# Patient Record
Sex: Male | Born: 1950 | Race: White | Hispanic: No | Marital: Married | Smoking: Former smoker
Health system: Southern US, Community
[De-identification: ages and names within clinical notes are randomized; demographics above are authoritative.]

## PROBLEM LIST (undated history)

## (undated) DIAGNOSIS — J449 Chronic obstructive pulmonary disease, unspecified: Secondary | ICD-10-CM

## (undated) DIAGNOSIS — B029 Zoster without complications: Secondary | ICD-10-CM

## (undated) DIAGNOSIS — B3781 Candidal esophagitis: Secondary | ICD-10-CM

## (undated) DIAGNOSIS — M797 Fibromyalgia: Secondary | ICD-10-CM

## (undated) DIAGNOSIS — I10 Essential (primary) hypertension: Secondary | ICD-10-CM

## (undated) DIAGNOSIS — C649 Malignant neoplasm of unspecified kidney, except renal pelvis: Secondary | ICD-10-CM

## (undated) DIAGNOSIS — B9681 Helicobacter pylori [H. pylori] as the cause of diseases classified elsewhere: Secondary | ICD-10-CM

## (undated) DIAGNOSIS — K2970 Gastritis, unspecified, without bleeding: Secondary | ICD-10-CM

## (undated) DIAGNOSIS — I2510 Atherosclerotic heart disease of native coronary artery without angina pectoris: Secondary | ICD-10-CM

## (undated) HISTORY — PX: THYROID SURGERY: SHX805

## (undated) HISTORY — DX: Helicobacter pylori (H. pylori) as the cause of diseases classified elsewhere: B96.81

## (undated) HISTORY — PX: HERNIA REPAIR: SHX51

## (undated) HISTORY — DX: Gastritis, unspecified, without bleeding: K29.70

## (undated) HISTORY — PX: OTHER SURGICAL HISTORY: SHX169

## (undated) HISTORY — DX: Candidal esophagitis: B37.81

---

## 2000-11-09 ENCOUNTER — Emergency Department (HOSPITAL_COMMUNITY): Admission: EM | Admit: 2000-11-09 | Discharge: 2000-11-09 | Payer: Self-pay | Admitting: Internal Medicine

## 2000-11-09 ENCOUNTER — Encounter: Payer: Self-pay | Admitting: Emergency Medicine

## 2001-05-06 ENCOUNTER — Encounter: Admission: RE | Admit: 2001-05-06 | Discharge: 2001-05-06 | Payer: Self-pay | Admitting: Family Medicine

## 2001-05-26 ENCOUNTER — Encounter: Admission: RE | Admit: 2001-05-26 | Discharge: 2001-05-26 | Payer: Self-pay | Admitting: Family Medicine

## 2001-07-02 ENCOUNTER — Encounter: Admission: RE | Admit: 2001-07-02 | Discharge: 2001-07-02 | Payer: Self-pay | Admitting: Family Medicine

## 2001-08-08 ENCOUNTER — Emergency Department (HOSPITAL_COMMUNITY): Admission: EM | Admit: 2001-08-08 | Discharge: 2001-08-08 | Payer: Self-pay | Admitting: *Deleted

## 2001-08-11 ENCOUNTER — Encounter: Admission: RE | Admit: 2001-08-11 | Discharge: 2001-08-11 | Payer: Self-pay | Admitting: Family Medicine

## 2001-09-01 ENCOUNTER — Encounter: Admission: RE | Admit: 2001-09-01 | Discharge: 2001-09-01 | Payer: Self-pay | Admitting: Family Medicine

## 2003-01-04 ENCOUNTER — Emergency Department (HOSPITAL_COMMUNITY): Admission: EM | Admit: 2003-01-04 | Discharge: 2003-01-05 | Payer: Self-pay | Admitting: Unknown Physician Specialty

## 2003-01-04 ENCOUNTER — Encounter: Payer: Self-pay | Admitting: Emergency Medicine

## 2003-09-11 HISTORY — PX: PARTIAL NEPHRECTOMY: SHX414

## 2003-10-25 ENCOUNTER — Emergency Department (HOSPITAL_COMMUNITY): Admission: EM | Admit: 2003-10-25 | Discharge: 2003-10-25 | Payer: Self-pay | Admitting: Family Medicine

## 2004-05-24 ENCOUNTER — Encounter: Admission: RE | Admit: 2004-05-24 | Discharge: 2004-05-24 | Payer: Self-pay | Admitting: Family Medicine

## 2004-06-29 ENCOUNTER — Inpatient Hospital Stay (HOSPITAL_COMMUNITY): Admission: RE | Admit: 2004-06-29 | Discharge: 2004-07-06 | Payer: Self-pay | Admitting: Urology

## 2004-06-29 ENCOUNTER — Encounter (INDEPENDENT_AMBULATORY_CARE_PROVIDER_SITE_OTHER): Payer: Self-pay | Admitting: *Deleted

## 2005-09-10 ENCOUNTER — Emergency Department (HOSPITAL_COMMUNITY): Admission: EM | Admit: 2005-09-10 | Discharge: 2005-09-10 | Payer: Self-pay | Admitting: Family Medicine

## 2005-12-21 ENCOUNTER — Encounter: Admission: RE | Admit: 2005-12-21 | Discharge: 2005-12-21 | Payer: Self-pay | Admitting: Family Medicine

## 2008-09-25 ENCOUNTER — Emergency Department (HOSPITAL_COMMUNITY): Admission: EM | Admit: 2008-09-25 | Discharge: 2008-09-25 | Payer: Self-pay | Admitting: Emergency Medicine

## 2008-12-09 ENCOUNTER — Emergency Department (HOSPITAL_COMMUNITY): Admission: EM | Admit: 2008-12-09 | Discharge: 2008-12-09 | Payer: Self-pay | Admitting: Emergency Medicine

## 2008-12-16 ENCOUNTER — Encounter (INDEPENDENT_AMBULATORY_CARE_PROVIDER_SITE_OTHER): Payer: Self-pay | Admitting: Internal Medicine

## 2008-12-16 ENCOUNTER — Ambulatory Visit: Payer: Self-pay | Admitting: Cardiology

## 2008-12-16 ENCOUNTER — Ambulatory Visit (HOSPITAL_COMMUNITY): Admission: RE | Admit: 2008-12-16 | Discharge: 2008-12-16 | Payer: Self-pay | Admitting: Internal Medicine

## 2009-04-26 ENCOUNTER — Ambulatory Visit (HOSPITAL_COMMUNITY): Admission: RE | Admit: 2009-04-26 | Discharge: 2009-04-26 | Payer: Self-pay | Admitting: Internal Medicine

## 2009-05-01 ENCOUNTER — Emergency Department (HOSPITAL_COMMUNITY): Admission: EM | Admit: 2009-05-01 | Discharge: 2009-05-01 | Payer: Self-pay | Admitting: Emergency Medicine

## 2009-09-20 ENCOUNTER — Ambulatory Visit (HOSPITAL_COMMUNITY): Admission: RE | Admit: 2009-09-20 | Discharge: 2009-09-20 | Payer: Self-pay | Admitting: Internal Medicine

## 2009-09-25 ENCOUNTER — Emergency Department (HOSPITAL_COMMUNITY): Admission: EM | Admit: 2009-09-25 | Discharge: 2009-09-25 | Payer: Self-pay | Admitting: Emergency Medicine

## 2009-12-18 ENCOUNTER — Inpatient Hospital Stay (HOSPITAL_COMMUNITY): Admission: EM | Admit: 2009-12-18 | Discharge: 2009-12-20 | Payer: Self-pay | Admitting: Emergency Medicine

## 2010-02-07 ENCOUNTER — Ambulatory Visit (HOSPITAL_COMMUNITY): Admission: RE | Admit: 2010-02-07 | Discharge: 2010-02-07 | Payer: Self-pay | Admitting: Internal Medicine

## 2010-11-26 LAB — CBC
HCT: 44.5 % (ref 39.0–52.0)
Hemoglobin: 15 g/dL (ref 13.0–17.0)
Platelets: 236 10*3/uL (ref 150–400)
RBC: 4.77 MIL/uL (ref 4.22–5.81)
WBC: 10.5 10*3/uL (ref 4.0–10.5)

## 2010-11-26 LAB — DIFFERENTIAL
Eosinophils Absolute: 0.1 10*3/uL (ref 0.0–0.7)
Eosinophils Relative: 1 % (ref 0–5)
Lymphocytes Relative: 13 % (ref 12–46)
Lymphs Abs: 1.3 10*3/uL (ref 0.7–4.0)
Neutrophils Relative %: 74 % (ref 43–77)

## 2010-11-26 LAB — URINALYSIS, ROUTINE W REFLEX MICROSCOPIC
Bilirubin Urine: NEGATIVE
Glucose, UA: NEGATIVE mg/dL
Hgb urine dipstick: NEGATIVE
Nitrite: NEGATIVE
Urobilinogen, UA: 0.2 mg/dL (ref 0.0–1.0)
pH: 5.5 (ref 5.0–8.0)

## 2010-11-26 LAB — BASIC METABOLIC PANEL
BUN: 28 mg/dL — ABNORMAL HIGH (ref 6–23)
CO2: 30 mEq/L (ref 19–32)
Chloride: 100 mEq/L (ref 96–112)
Creatinine, Ser: 1.68 mg/dL — ABNORMAL HIGH (ref 0.4–1.5)
Sodium: 135 mEq/L (ref 135–145)

## 2010-11-27 LAB — BLOOD GAS, ARTERIAL
Acid-Base Excess: 4.9 mmol/L — ABNORMAL HIGH (ref 0.0–2.0)
Bicarbonate: 29.5 mEq/L — ABNORMAL HIGH (ref 20.0–24.0)
O2 Saturation: 92.7 %
Patient temperature: 37
TCO2: 25.4 mmol/L (ref 0–100)
pH, Arterial: 7.403 (ref 7.350–7.450)
pO2, Arterial: 60.5 mmHg — ABNORMAL LOW (ref 80.0–100.0)

## 2010-11-29 LAB — CBC
Hemoglobin: 13.9 g/dL (ref 13.0–17.0)
MCV: 94.8 fL (ref 78.0–100.0)
Platelets: 250 10*3/uL (ref 150–400)
RDW: 14.4 % (ref 11.5–15.5)
WBC: 12.8 10*3/uL — ABNORMAL HIGH (ref 4.0–10.5)

## 2010-11-29 LAB — BASIC METABOLIC PANEL
CO2: 30 mEq/L (ref 19–32)
CO2: 31 mEq/L (ref 19–32)
Calcium: 8.2 mg/dL — ABNORMAL LOW (ref 8.4–10.5)
Chloride: 102 mEq/L (ref 96–112)
Chloride: 102 mEq/L (ref 96–112)
Creatinine, Ser: 1.62 mg/dL — ABNORMAL HIGH (ref 0.4–1.5)
Creatinine, Ser: 1.62 mg/dL — ABNORMAL HIGH (ref 0.4–1.5)
GFR calc non Af Amer: 44 mL/min — ABNORMAL LOW (ref >60)
Glucose, Bld: 120 mg/dL — ABNORMAL HIGH (ref 70–99)
Glucose, Bld: 168 mg/dL — ABNORMAL HIGH (ref 70–99)
Glucose, Bld: 211 mg/dL — ABNORMAL HIGH (ref 70–99)
Potassium: 4.8 mEq/L (ref 3.5–5.1)
Potassium: 5.2 mEq/L — ABNORMAL HIGH (ref 3.5–5.1)
Sodium: 137 mEq/L (ref 135–145)

## 2010-11-29 LAB — BASIC METABOLIC PANEL WITH GFR
BUN: 28 mg/dL — ABNORMAL HIGH (ref 6–23)
CO2: 35 meq/L — ABNORMAL HIGH (ref 19–32)
Calcium: 8.2 mg/dL — ABNORMAL LOW (ref 8.4–10.5)
Chloride: 100 meq/L (ref 96–112)
Creatinine, Ser: 1.68 mg/dL — ABNORMAL HIGH (ref 0.4–1.5)
GFR calc non Af Amer: 42 mL/min — ABNORMAL LOW
Glucose, Bld: 97 mg/dL (ref 70–99)
Potassium: 3.8 meq/L (ref 3.5–5.1)
Sodium: 140 meq/L (ref 135–145)

## 2010-11-29 LAB — CULTURE, RESPIRATORY W GRAM STAIN: Culture: NORMAL

## 2010-11-29 LAB — POCT CARDIAC MARKERS
CKMB, poc: 1.6 ng/mL (ref 1.0–8.0)
Myoglobin, poc: 141 ng/mL (ref 12–200)
Troponin i, poc: <0.05 ng/mL (ref 0.00–0.09)

## 2010-11-29 LAB — DIFFERENTIAL
Basophils Absolute: 0 10*3/uL (ref 0.0–0.1)
Lymphs Abs: 1.6 10*3/uL (ref 0.7–4.0)
Neutro Abs: 10.1 10*3/uL — ABNORMAL HIGH (ref 1.7–7.7)
Neutrophils Relative %: 79 % — ABNORMAL HIGH (ref 43–77)

## 2010-11-29 LAB — EXPECTORATED SPUTUM ASSESSMENT W GRAM STAIN, RFLX TO RESP C

## 2010-12-16 LAB — CREATININE, SERUM: GFR calc Af Amer: 59 mL/min — ABNORMAL LOW (ref >60)

## 2010-12-25 LAB — GLUCOSE, CAPILLARY: Glucose-Capillary: 71 mg/dL (ref 70–99)

## 2011-01-26 NOTE — Discharge Summary (Signed)
Connor Armstrong, Connor Armstrong             ACCOUNT NO.:  [id number]   MEDICAL RECORD NO.:  [id number]          PATIENT TYPE:  INP   LOCATION:  0364                         FACILITY:  [facility]   PHYSICIAN:  Lucrezia Starch. Earlene Plater, M.D.  DATE OF BIRTH:  1951-06-12   DATE OF ADMISSION:  06/29/2004  DATE OF DISCHARGE:  07/06/2004                                 DISCHARGE SUMMARY   Connor Armstrong was admitted on June 23, 2004, for a 2 x 2 cm right renal  mass.  Connor Armstrong underwent right partial nephrectomy by Dr. Darvin Neighbours.   HISTORY OF PRESENT ILLNESS:  Connor Armstrong has had a 7 day hospital course complicated  by a postop ileus.  Connor Armstrong was seen by GI, Dr. Danise Edge.  Connor Armstrong also has had  some sleep apnea, and Connor Armstrong has been using O2 at night to maintain saturations.  Today, Connor Armstrong is doing well.  Connor Armstrong continues to get the Vicodin p.r.n. for pain.  Connor Armstrong is ambulating.  Connor Armstrong is using his incentive spirometry.  Connor Armstrong is passing gas,  voiding well, and having a bowel movement x 3 since admission.   Past medical history, social history, review of systems.  See H&P for full  details.   PHYSICAL EXAMINATION:  GENERAL:  Thin, white male in no acute distress.  HEENT:  Normocephalic.  CHEST:  Lungs clear to auscultation.  CARDIAC:  Regular rate and rhythm.  ABDOMEN:  Soft, mildly distended, nonpainful.  No palpable.  Right flank  incision staples intact with 2 x 2's on right mid quadrant and at posterior  edge of incision.  EXTREMITIES:  Pedal pulses present, no pain, negative to [place].   DISCHARGE MEDICATIONS:  1.  Colace 100 mg p.o. b.i.d.  2.  Vicodin 1-2 tabs q.3-4h. p.r.n. pain.  3.  For breakthrough pain, may use Tylenol or Motrin, p.r.n. pain as well.   DISCHARGE CONDITION:  Stable.  Connor Armstrong is to follow up in 1 week with Dr. Earlene Plater.   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     Victorino Dike   JML/MEDQ  D:  07/06/2004  T:  07/06/2004  Job:   [id number]

## 2011-01-26 NOTE — Op Note (Signed)
NAMEJERID, CATHERMAN             ACCOUNT NO.:  [id number]   MEDICAL RECORD NO.:  [id number]          PATIENT TYPE:  INP   LOCATION:  0002                         FACILITY:  [facility]   PHYSICIAN:  Ronald L. Earlene Plater, M.D.  DATE OF BIRTH:  July 17, 1951   DATE OF PROCEDURE:  06/29/2004  DATE OF DISCHARGE:                                 OPERATIVE REPORT   PREOPERATIVE DIAGNOSIS:  Right kidney tumor.   POSTOPERATIVE DIAGNOSIS:  Right kidney tumor.   PROCEDURE:  Right partial nephrectomy.   ATTENDING PHYSICIAN:  Lucrezia Starch. Earlene Plater, M.D.   RESIDENT SURGEON:  Rhae Lerner, M.D.   ANESTHESIA:  General endotracheal anesthesia.   COMPLICATIONS:  None.   ESTIMATED BLOOD LOSS:  100 cc.   INDICATIONS FOR PROCEDURE:  Mr. Dimarco is a 60 year old white male who  presented for evaluation of a 2 x 2 cm right mid polar renal mass that was  noted on a CT scan of the abdomen. The CT scan had been performed during  workup for a 30-pound weight loss. CT scan demonstrated the right mid polar  mass which enhanced consistent with renal cell carcinoma. After discussing  treatments options with Mr. Membreno, he has elected to proceed with an open  right partial nephrectomy.   PROCEDURE IN DETAIL:  The patient was brought to the operating room  following induction of general endotracheal anesthesia. Was placed a  modified right flank position and prepped and draped in the usual sterile  fashion. An incision was subsequently made from a point midway between the  xiphoid process and the umbilicus in the midline to the tip of the 11th rib.  Extension was carried down through the muscle layers and the fascia and into  the retroperitoneum. The peritoneum was subsequently identified and opened  carefully with Metzenbaum scissors. The peritoneum was subsequently opened  along the anterior incision to provide better exposure to the right kidney  and right retroperitoneum. The right kidney was subsequently  identified  within Gerota's fascia, and the Buckwalter retractor was placed with the  retractor blade set for maximal exposure of the right kidney and right  retroperitoneum. The duodenum was then identified and reflected medially  away from the right kidney using a Coker maneuver. Once this had been  performed, the anterior leaf of Gerota's fascia was fully freed from all  surrounding attachments and carried down to the level of the inferior vena  cava. The vena cava was followed superiorly, carefully dissecting away all  adherent retroperitoneal tissues until the take off of the right renal vein  was identified. Careful dissection of the right femoral vein was  subsequently performed until the vein was easily isolated with a vessel  loop. A small right renal artery was subsequently identified posterior and  inferior to the renal vein. Again this vessel was skeletonized, and a vessel  loop placed for identification. At this point, further dissection of the  hilum was meticulously performed in an attempt to identify a second renal  artery as the first was quite small. A much larger renal artery was  eventually identified lying immediately posterior  and adherent to the renal  vein. At this point, all vessels had been isolated, and the remainder of the  right kidney was subsequently dissected away from all surrounding tissues.  During this process, the inferior pole was freed and the ureter identified.  In addition, Gerota's fascia was entered, and the right adrenal gland  carefully dissected off of the upper pole of the right kidney. Once the  right kidney had been adequately isolated, the mid polar tumor was  identified and circumscribed using Bovie cautery. Bulldog clamps were  subsequently placed on the two right renal arteries. The tumor was then  carefully excised from the kidney using the blunt handle of a scalpel. The  tumor was subsequently sent for frozen section analysis to confirm  negative  tumor margins. Following frozen resection results, the base of the tumor bed  was closed using running 4-0 chromic suture. The edges as well as the bed of  resection site were subsequently burned with an Argon beam. Once the bed  appeared dry, Tisseel was applied followed Surgicel. Once this had dried,  the bulldog clamps were removed from the arteries, and a second inspection  performed to assure hemostasis. Once hemostasis had been confirmed, the  combination of perirenal fat as well as omentum were approximated over the  kidney using interrupted 3-0 chromic suture. At this point, frozen section  analysis came back confirming negative tumor margins. Kidney was  subsequently inspected a final time, and the vessel loops were removed. The  retractor blades were subsequently removed and closure of the wounds  initiated. The wound was closed in three layers, the first a running #1 PDS  reapproximated the transverse abdominis fascia. A second layer of running #1  PDS reapproximated the internal oblique fascia, and a third running #1 PDS  was used to reapproximate the external oblique fascia and aponeurosis. Skin  was subsequently closed using skin staples. At this point in the procedure,  the patient was awakened and the case was ended. The patient tolerated the  procedure well and no complications. Please note that Dr. Darvin Neighbours was  present for entire case and participated in all aspects of the procedure.      JP/MEDQ  D:  06/29/2004  T:  06/29/2004  Job:  [id number]

## 2011-01-26 NOTE — H&P (Signed)
Connor Armstrong, Connor Armstrong             ACCOUNT NO.:  [id number]   MEDICAL RECORD NO.:  [id number]          PATIENT TYPE:  INP   LOCATION:  0002                         FACILITY:  [facility]   PHYSICIAN:  Ronald L. Earlene Plater, M.D.  DATE OF BIRTH:  December 09, 1950   DATE OF ADMISSION:  06/29/2004  DATE OF DISCHARGE:                                HISTORY & PHYSICAL   HISTORY OF PRESENT ILLNESS:  [name] is a 60 year old white male with a  past medical history significant for COPD and smoking who presented with a  20 pound weight loss. The patient underwent evaluation with a CT scan of the  abdomen with contrast which revealed a 2 x 2 cm right renal mass.  After a  full evaluation and discussion of treatment options with [name]  including total versus partial right nephrectomy [name] has elected to  proceed with an attempted right partial nephrectomy.   REVIEW OF SYMPTOMS:  As above otherwise negative.   PAST MEDICAL HISTORY:  COPD, sleep apnea.   PAST SURGICAL HISTORY:  Status post parathyroidectomy, status post kidney  stone removal, status post umbilical hernia repair as a child.   ALLERGIES:  No known drug allergies.   FAMILY HISTORY:  No known history of GU malignancy.   SOCIAL HISTORY:  The patient has smoked 2-3 packs per day for 30 years. He  denies any alcohol use or illicit drug use.   MEDICATIONS:  None.   PHYSICAL EXAMINATION:  HEENT:  Pupils equal round and reactive to light.  Extraocular movements intact.  NECK:  No masses.  HEART:  Regular rate and rhythm without murmurs.  LUNGS:  Clear to auscultation bilaterally.  ABDOMEN:  Soft, nontender, nondistended, bowel sounds positive.  EXTREMITIES:  No cyanosis, clubbing or edema.   ASSESSMENT:  A 60 year old male with a right kidney tumor.  Will plan to  take [name] to the operating room at which time he will undergo an  attempted right partial nephrectomy. The patient understands that a total  nephrectomy may  be necessary at the time of surgery due to tumor location or  in the event of significant bleeding.     JP/MEDQ  D:  06/29/2004  T:  06/29/2004  Job:  [id number]

## 2011-09-17 ENCOUNTER — Encounter: Payer: Self-pay | Admitting: *Deleted

## 2011-09-17 ENCOUNTER — Other Ambulatory Visit: Payer: Self-pay

## 2011-09-17 ENCOUNTER — Emergency Department (HOSPITAL_COMMUNITY): Payer: Medicaid Other

## 2011-09-17 ENCOUNTER — Inpatient Hospital Stay (HOSPITAL_COMMUNITY)
Admission: EM | Admit: 2011-09-17 | Discharge: 2011-09-21 | DRG: 192 | Disposition: A | Discharge status: Home / Self Care | Payer: Medicaid Other | Attending: Pulmonary Disease | Admitting: Pulmonary Disease

## 2011-09-17 DIAGNOSIS — R0902 Hypoxemia: Secondary | ICD-10-CM

## 2011-09-17 DIAGNOSIS — J441 Chronic obstructive pulmonary disease with (acute) exacerbation: Secondary | ICD-10-CM | POA: Diagnosis present

## 2011-09-17 DIAGNOSIS — J449 Chronic obstructive pulmonary disease, unspecified: Secondary | ICD-10-CM

## 2011-09-17 DIAGNOSIS — M797 Fibromyalgia: Secondary | ICD-10-CM | POA: Diagnosis present

## 2011-09-17 DIAGNOSIS — IMO0001 Reserved for inherently not codable concepts without codable children: Secondary | ICD-10-CM | POA: Diagnosis present

## 2011-09-17 DIAGNOSIS — J440 Chronic obstructive pulmonary disease with acute lower respiratory infection: Principal | ICD-10-CM | POA: Diagnosis present

## 2011-09-17 DIAGNOSIS — J209 Acute bronchitis, unspecified: Principal | ICD-10-CM | POA: Diagnosis present

## 2011-09-17 DIAGNOSIS — I10 Essential (primary) hypertension: Secondary | ICD-10-CM | POA: Diagnosis present

## 2011-09-17 HISTORY — DX: Fibromyalgia: M79.7

## 2011-09-17 LAB — DIFFERENTIAL
Basophils Absolute: 0 10*3/uL (ref 0.0–0.1)
Lymphocytes Relative: 6 % — ABNORMAL LOW (ref 12–46)
Monocytes Absolute: 1.1 10*3/uL — ABNORMAL HIGH (ref 0.1–1.0)
Neutro Abs: 14.9 10*3/uL — ABNORMAL HIGH (ref 1.7–7.7)

## 2011-09-17 LAB — BASIC METABOLIC PANEL
CO2: 34 mEq/L — ABNORMAL HIGH (ref 19–32)
Chloride: 98 mEq/L (ref 96–112)
Creatinine, Ser: 1.3 mg/dL (ref 0.50–1.35)
Sodium: 136 mEq/L (ref 135–145)

## 2011-09-17 LAB — CBC
HCT: 41.5 % (ref 39.0–52.0)
Hemoglobin: 13.3 g/dL (ref 13.0–17.0)
RDW: 14.3 % (ref 11.5–15.5)
WBC: 17.2 10*3/uL — ABNORMAL HIGH (ref 4.0–10.5)

## 2011-09-17 LAB — INFLUENZA PANEL BY PCR (TYPE A & B): H1N1 flu by pcr: NOT DETECTED

## 2011-09-17 LAB — MRSA PCR SCREENING: MRSA by PCR: NEGATIVE

## 2011-09-17 LAB — CULTURE, BLOOD (ROUTINE X 2): Culture: NO GROWTH

## 2011-09-17 LAB — LACTIC ACID, PLASMA: Lactic Acid, Venous: 1.6 mmol/L (ref 0.5–2.2)

## 2011-09-17 MED ORDER — ALBUTEROL SULFATE (5 MG/ML) 0.5% IN NEBU
5.0000 mg | INHALATION_SOLUTION | Freq: Once | RESPIRATORY_TRACT | Status: AC
  Administered 2011-09-17: 5 mg via RESPIRATORY_TRACT
  Filled 2011-09-17: qty 1

## 2011-09-17 MED ORDER — ZOLPIDEM TARTRATE 5 MG PO TABS
5.0000 mg | ORAL_TABLET | Freq: Every evening | ORAL | Status: DC | PRN
  Administered 2011-09-18 – 2011-09-20 (×3): 5 mg via ORAL
  Filled 2011-09-17 (×3): qty 1

## 2011-09-17 MED ORDER — ALBUTEROL SULFATE (5 MG/ML) 0.5% IN NEBU
2.5000 mg | INHALATION_SOLUTION | Freq: Four times a day (QID) | RESPIRATORY_TRACT | Status: DC
  Administered 2011-09-17 – 2011-09-21 (×13): 2.5 mg via RESPIRATORY_TRACT
  Filled 2011-09-17 (×15): qty 0.5

## 2011-09-17 MED ORDER — MOXIFLOXACIN HCL IN NACL 400 MG/250ML IV SOLN
400.0000 mg | INTRAVENOUS | Status: DC
  Administered 2011-09-18 – 2011-09-20 (×3): 400 mg via INTRAVENOUS
  Filled 2011-09-17 (×3): qty 250

## 2011-09-17 MED ORDER — IPRATROPIUM BROMIDE 0.02 % IN SOLN
0.5000 mg | Freq: Once | RESPIRATORY_TRACT | Status: AC
  Administered 2011-09-17: 0.5 mg via RESPIRATORY_TRACT
  Filled 2011-09-17: qty 2.5

## 2011-09-17 MED ORDER — MILNACIPRAN HCL 50 MG PO TABS
50.0000 mg | ORAL_TABLET | Freq: Two times a day (BID) | ORAL | Status: DC
  Administered 2011-09-17 – 2011-09-21 (×8): 50 mg via ORAL
  Filled 2011-09-17 (×9): qty 1

## 2011-09-17 MED ORDER — GUAIFENESIN ER 600 MG PO TB12
600.0000 mg | ORAL_TABLET | Freq: Two times a day (BID) | ORAL | Status: DC
  Administered 2011-09-17 – 2011-09-21 (×8): 600 mg via ORAL
  Filled 2011-09-17 (×8): qty 1

## 2011-09-17 MED ORDER — FLUTICASONE-SALMETEROL 250-50 MCG/DOSE IN AEPB
1.0000 | INHALATION_SPRAY | Freq: Two times a day (BID) | RESPIRATORY_TRACT | Status: DC
  Administered 2011-09-18 – 2011-09-21 (×8): 1 via RESPIRATORY_TRACT
  Filled 2011-09-17: qty 14

## 2011-09-17 MED ORDER — SODIUM CHLORIDE 0.9 % IV SOLN
INTRAVENOUS | Status: DC
  Administered 2011-09-18: 125 mL via INTRAVENOUS
  Administered 2011-09-18 – 2011-09-19 (×2): via INTRAVENOUS

## 2011-09-17 MED ORDER — OSELTAMIVIR PHOSPHATE 75 MG PO CAPS
75.0000 mg | ORAL_CAPSULE | Freq: Two times a day (BID) | ORAL | Status: DC
  Administered 2011-09-17: 75 mg via ORAL
  Filled 2011-09-17: qty 1

## 2011-09-17 MED ORDER — OSELTAMIVIR PHOSPHATE 75 MG PO CAPS
75.0000 mg | ORAL_CAPSULE | Freq: Once | ORAL | Status: AC
  Administered 2011-09-17: 75 mg via ORAL
  Filled 2011-09-17: qty 1

## 2011-09-17 MED ORDER — CYCLOBENZAPRINE HCL 10 MG PO TABS
10.0000 mg | ORAL_TABLET | Freq: Two times a day (BID) | ORAL | Status: DC
  Administered 2011-09-17 – 2011-09-21 (×8): 10 mg via ORAL
  Filled 2011-09-17 (×8): qty 1

## 2011-09-17 MED ORDER — ALUM & MAG HYDROXIDE-SIMETH 200-200-20 MG/5ML PO SUSP: 30.0000 mL | Freq: Four times a day (QID) | ORAL | Status: DC | PRN

## 2011-09-17 MED ORDER — TRAMADOL HCL 50 MG PO TABS
50.0000 mg | ORAL_TABLET | Freq: Four times a day (QID) | ORAL | Status: DC | PRN
  Filled 2011-09-17: qty 1

## 2011-09-17 MED ORDER — ONDANSETRON HCL 4 MG PO TABS: 4.0000 mg | ORAL_TABLET | Freq: Four times a day (QID) | ORAL | Status: DC | PRN

## 2011-09-17 MED ORDER — IPRATROPIUM BROMIDE 0.02 % IN SOLN
0.5000 mg | Freq: Four times a day (QID) | RESPIRATORY_TRACT | Status: DC
  Administered 2011-09-17 – 2011-09-19 (×7): 0.5 mg via RESPIRATORY_TRACT
  Filled 2011-09-17 (×7): qty 2.5

## 2011-09-17 MED ORDER — PREDNISONE 20 MG PO TABS
60.0000 mg | ORAL_TABLET | Freq: Once | ORAL | Status: AC
  Administered 2011-09-17: 60 mg via ORAL
  Filled 2011-09-17: qty 3

## 2011-09-17 MED ORDER — FLUTICASONE-SALMETEROL 250-50 MCG/DOSE IN AEPB
INHALATION_SPRAY | RESPIRATORY_TRACT | Status: AC
  Filled 2011-09-17: qty 14

## 2011-09-17 MED ORDER — TIOTROPIUM BROMIDE MONOHYDRATE 18 MCG IN CAPS
18.0000 ug | ORAL_CAPSULE | Freq: Every day | RESPIRATORY_TRACT | Status: DC
  Administered 2011-09-18 – 2011-09-21 (×4): 18 ug via RESPIRATORY_TRACT
  Filled 2011-09-17: qty 5

## 2011-09-17 MED ORDER — LISINOPRIL 10 MG PO TABS: 20.0000 mg | ORAL_TABLET | Freq: Every day | ORAL | Status: DC

## 2011-09-17 MED ORDER — HYDROCHLOROTHIAZIDE 12.5 MG PO CAPS: 12.5000 mg | ORAL_CAPSULE | Freq: Every day | ORAL | Status: DC

## 2011-09-17 MED ORDER — ONDANSETRON HCL 4 MG/2ML IJ SOLN: 4.0000 mg | Freq: Four times a day (QID) | INTRAMUSCULAR | Status: DC | PRN

## 2011-09-17 MED ORDER — HYDROCODONE-ACETAMINOPHEN 5-325 MG PO TABS
1.0000 | ORAL_TABLET | ORAL | Status: DC | PRN
  Administered 2011-09-18: 2 via ORAL
  Administered 2011-09-18: 1 via ORAL
  Filled 2011-09-17: qty 1
  Filled 2011-09-17: qty 2

## 2011-09-17 MED ORDER — SODIUM CHLORIDE 0.9 % IV SOLN
INTRAVENOUS | Status: AC
  Administered 2011-09-17: 17:00:00 via INTRAVENOUS

## 2011-09-17 MED ORDER — HYDROCOD POLST-CHLORPHEN POLST 10-8 MG/5ML PO LQCR
5.0000 mL | Freq: Once | ORAL | Status: AC
  Administered 2011-09-17: 5 mL via ORAL
  Filled 2011-09-17: qty 5

## 2011-09-17 MED ORDER — ACETAMINOPHEN 650 MG RE SUPP: 650.0000 mg | Freq: Four times a day (QID) | RECTAL | Status: DC | PRN

## 2011-09-17 MED ORDER — ENOXAPARIN SODIUM 40 MG/0.4ML ~~LOC~~ SOLN
40.0000 mg | SUBCUTANEOUS | Status: DC
  Administered 2011-09-17 – 2011-09-20 (×4): 40 mg via SUBCUTANEOUS
  Filled 2011-09-17 (×4): qty 0.4

## 2011-09-17 MED ORDER — ACETAMINOPHEN 325 MG PO TABS
650.0000 mg | ORAL_TABLET | Freq: Four times a day (QID) | ORAL | Status: DC | PRN
  Administered 2011-09-18 – 2011-09-21 (×2): 650 mg via ORAL
  Filled 2011-09-17 (×2): qty 2

## 2011-09-17 MED ORDER — LISINOPRIL-HYDROCHLOROTHIAZIDE 20-12.5 MG PO TABS: 1.0000 | ORAL_TABLET | Freq: Every day | ORAL | Status: DC

## 2011-09-17 NOTE — ED Provider Notes (Signed)
History   Scribed for Joya Gaskins, MD, the patient was seen in [facility]. The chart was scribed by Gilman Schmidt. The patients care was started at 11:55 AM.   CSN: [id number]  Arrival date & time 09/17/11  1106   First MD Initiated Contact with Patient 09/17/11 1153      Chief Complaint  Patient presents with  . Shortness of Breath     HPI Connor Armstrong is a 61 y.o. male with a history of COPD, HTN, and Fibromyalgia who presents to the Emergency Department complaining of shortness of breath. Also notes fever of 102.8 last night and 101 today,abdominal pain (associated with fibromyalgia), headache, weakness, and productive cough. Denies any blood in cough, chest pain, or loc. Pt is on oxygen at home as needed. There are no other associated symptoms and no other alleviating or aggravating factors.  Pt reports he is worsening at this tim  PCP: Dr. Juanetta Gosling Past Medical History  Diagnosis Date  . COPD (chronic obstructive pulmonary disease)   . Hypertension   . Fibromyalgia     Past Surgical History  Procedure Date  . Hernia repair   . Thyroid surgery   . Parathyroid surgery   . Kidney surgery     No family history on file.  History  Substance Use Topics  . Smoking status: Current Everyday Smoker -- 0.5 packs/day  . Smokeless tobacco: Not on file  . Alcohol Use: No      Review of Systems  Respiratory: Positive for cough and shortness of breath.   Cardiovascular: Negative for chest pain.  Gastrointestinal: Positive for abdominal pain.  Neurological: Positive for weakness and headaches. Negative for syncope.  All other systems reviewed and are negative.    Allergies  Review of patient's allergies indicates no known allergies.  Home Medications  No current outpatient prescriptions on file.  BP 94/65  Pulse 120  Temp(Src) 99.9 F (37.7 C) (Oral)  Resp 24  Ht 5\' 10"  (1.778 m)  Wt 168 lb (76.204 kg)  BMI 24.11 kg/m2  SpO2 78%  BP 104/55  Pulse 117   Temp(Src) 99.9 F (37.7 C) (Oral)  Resp 20  Ht 5\' 10"  (1.778 m)  Wt 168 lb (76.204 kg)  BMI 24.11 kg/m2  SpO2 97%   Physical Exam CONSTITUTIONAL: Well developed/well nourished HEAD AND FACE: Normocephalic/atraumatic EYES: EOMI/PERRL ENMT: Mucous membranes moist NECK: supple no meningeal signs SPINE:entire spine nontender CV: tachycardic S1/S2 noted, no murmurs/rubs/gallops noted LUNGS: tachypneic, wheezes bilaterally, crackles in left base ABDOMEN: soft, nontender, no rebound or guarding GU:no cva tenderness NEURO: Pt is awake/alert, moves all extremitiesx4 EXTREMITIES: pulses normal, full ROM SKIN: warm, color normal PSYCH: no abnormalities of mood noted  ED Course  Procedures  CRITICAL CARE Performed by: Joya Gaskins   Total critical care time: 35  Critical care time was exclusive of separately billable procedures and treating other patients.  Critical care was necessary to treat or prevent imminent or life-threatening deterioration.  Critical care was time spent personally by me on the following activities: development of treatment plan with patient and/or surrogate as well as nursing, discussions with consultants, evaluation of patient's response to treatment, examination of patient, obtaining history from patient or surrogate, ordering and performing treatments and interventions, ordering and review of laboratory studies, ordering and review of radiographic studies, pulse oximetry and re-evaluation of patient's condition.   DIAGNOSTIC STUDIES: Oxygen Saturation is 78% on abnormal, by my interpretation.    Radiology: DG Pneumonia Chest Port. Reviewed  by me.  IMPRESSION: Emphysema without acute finding.  Original Report Authenticated By: Reyes Ivan, M.D.  COORDINATION OF CARE: 11:55am:  - Patient evaluated by ED physician, Proventil, Atrovent, Deltasone, DG Pnemonia Chest, CBC, Diff, BMP, Lactic Acid, blood culture ordered   1:26 PM Pt with  likely influenza, hypoxic, will need admit  1:31 PM Vital signs improving Still tachycardic, still reporting SOB IV fluids given, continue resp treatments Suspect influenza, tamiflu ordered given his fever at home, will defer abx at this time D/w dr Juanetta Gosling will admit to tele  MDM  Nursing notes reviewed and considered in documentation All labs/vitals reviewed and considered xrays reviewed and considered Previous records reviewed and considered   Date: 09/17/2011  Rate: 117  Rhythm: sinus tachycardia  QRS Axis: normal  Intervals: normal  ST/T Wave abnormalities: nonspecific ST changes  Conduction Disutrbances:none  Narrative Interpretation:   Old EKG Reviewed: changes noted Rate is faster today     I personally performed the services described in this documentation, which was scribed in my presence. The recorded information has been reviewed and considered.          Joya Gaskins, MD 09/17/11 [phone]

## 2011-09-17 NOTE — ED Notes (Signed)
Pt coughing has increased.  Pt becomes red in face and SOB.  EDP notified of need for cough syrup and neb treatment.  Pt also c/o tightness in chest related to cough. No increased distress noted with O2 need.

## 2011-09-17 NOTE — ED Notes (Signed)
Patient given graham crackers and sprite.  Patient resting comfortably

## 2011-09-17 NOTE — ED Notes (Signed)
Pt states has been" feeling ill x 2 days" and today has gotten worse. Portable chest xray completed, results pending.

## 2011-09-17 NOTE — H&P (Signed)
Connor Armstrong MRN: [id number] DOB/AGE: 1951/07/22 60 y.o. Primary Care Physician:No primary provider on file. Admit date: 09/17/2011 Chief Complaint: Shortness of breath HPI: This is a 61 year old with a long known history of COPD who came to the emergency room because of shortness of breath. He has been coughing and congested. He also has a history of hypertension and fibromyalgia. He has had aching all over he is had fever and he has not been coughing anything up.  Past Medical History  Diagnosis Date  . COPD (chronic obstructive pulmonary disease)   . Hypertension   . Fibromyalgia    Past Surgical History  Procedure Date  . Hernia repair   . Thyroid surgery   . Parathyroid surgery   . Kidney surgery         No family history on file.  Social History:  reports that he has been smoking.  He does not have any smokeless tobacco history on file. He reports that he does not drink alcohol. His drug history not on file.   Allergies: No Known Allergies  Medications Prior to Admission  Medication Dose Route Frequency Provider Last Rate Last Dose  . 0.9 %  sodium chloride infusion   Intravenous STAT Joya Gaskins, MD 125 mL/hr at 09/17/11 1653    . 0.9 %  sodium chloride infusion   Intravenous Continuous Fredirick Maudlin, MD      . acetaminophen (TYLENOL) tablet 650 mg  650 mg Oral Q6H PRN Fredirick Maudlin, MD       Or  . acetaminophen (TYLENOL) suppository 650 mg  650 mg Rectal Q6H PRN Fredirick Maudlin, MD      . albuterol (PROVENTIL) (5 MG/ML) 0.5% nebulizer solution 2.5 mg  2.5 mg Nebulization Q6H Fredirick Maudlin, MD      . albuterol (PROVENTIL) (5 MG/ML) 0.5% nebulizer solution 5 mg  5 mg Nebulization Once Joya Gaskins, MD   5 mg at 09/17/11 1224  . albuterol (PROVENTIL) (5 MG/ML) 0.5% nebulizer solution 5 mg  5 mg Nebulization Once Joya Gaskins, MD   5 mg at 09/17/11 1403  . alum & mag hydroxide-simeth (MAALOX/MYLANTA) 200-200-20 MG/5ML suspension 30 mL  30 mL  Oral Q6H PRN Fredirick Maudlin, MD      . chlorpheniramine-HYDROcodone (TUSSIONEX) 10-8 MG/5ML suspension 5 mL  5 mL Oral Once Joya Gaskins, MD   5 mL at 09/17/11 1456  . enoxaparin (LOVENOX) injection 40 mg  40 mg Subcutaneous Q24H Fredirick Maudlin, MD      . Fluticasone-Salmeterol (ADVAIR) 250-50 MCG/DOSE inhaler 1 puff  1 puff Inhalation Q12H Fredirick Maudlin, MD      . guaiFENesin [facility]) 12 hr tablet 600 mg  600 mg Oral BID Fredirick Maudlin, MD      . HYDROcodone-acetaminophen [facility]) 5-325 MG per tablet 1-2 tablet  1-2 tablet Oral Q4H PRN Fredirick Maudlin, MD      . ipratropium (ATROVENT) nebulizer solution 0.5 mg  0.5 mg Nebulization Once Joya Gaskins, MD   0.5 mg at 09/17/11 1224  . ipratropium (ATROVENT) nebulizer solution 0.5 mg  0.5 mg Nebulization Q6H Fredirick Maudlin, MD      . lisinopril-hydrochlorothiazide (PRINZIDE,ZESTORETIC) 20-12.5 MG per tablet 1 tablet  1 tablet Oral Daily Fredirick Maudlin, MD      . Milnacipran [facility]) tablet TABS 50 mg  50 mg Oral BID Fredirick Maudlin, MD      . ondansetron [facility]) tablet 4  mg  4 mg Oral Q6H PRN Fredirick Maudlin, MD       Or  . ondansetron [facility]) injection 4 mg  4 mg Intravenous Q6H PRN Fredirick Maudlin, MD      . oseltamivir (TAMIFLU) capsule 75 mg  75 mg Oral Once Joya Gaskins, MD   75 mg at 09/17/11 1453  . oseltamivir (TAMIFLU) capsule 75 mg  75 mg Oral BID Fredirick Maudlin, MD      . predniSONE (DELTASONE) tablet 60 mg  60 mg Oral Once Joya Gaskins, MD   60 mg at 09/17/11 1237  . tiotropium (SPIRIVA) inhalation capsule 18 mcg  18 mcg Inhalation Daily Fredirick Maudlin, MD      . traMADol [facility]) tablet 50 mg  50 mg Oral Q6H PRN Fredirick Maudlin, MD      . zolpidem [facility]) tablet 5 mg  5 mg Oral QHS PRN Fredirick Maudlin, MD       No current outpatient prescriptions on file as of 09/17/2011.       WUJ:WJXBJ from the symptoms mentioned above,there are no other symptoms referable to all systems  reviewed.  Physical Exam: Blood pressure 99/64, pulse 109, temperature 99.9 F (37.7 C), temperature source Oral, resp. rate 18, height 5\' 10"  (1.778 m), weight 74.4 kg (164 lb 0.4 oz), SpO2 97.00%. He is awake and alert. His pupils are reactive. His nose and throat are clear. His neck is supple without masses. His chest is fairly clear. He has some rhonchi. His heart is regular without murmur gallop or rub. His abdomen is soft. His extremities showed no clubbing cyanosis or edema. His central nervous system examination is grossly intact.    Basename 09/17/11 1212  WBC 17.2*  NEUTROABS 14.9*  HGB 13.3  HCT 41.5  MCV 95.8  PLT 243    Basename 09/17/11 1212  NA 136  K 4.5  CL 98  CO2 34*  GLUCOSE 114*  BUN 19  CREATININE 1.30  CALCIUM 9.2  MG --  lablast2(ast:2,ALT:2,alkphos:2,bilitot:2,prot:2,albumin:2)@    No results found for this or any previous visit (from the past 240 hour(s)).   Dg Pneumonia Chest Port1v  09/17/2011  *RADIOLOGY REPORT*  Clinical Data: Productive cough and shortness of breath.  Fever.  PORTABLE CHEST - 1 VIEW  Comparison: 12/19/2009.  Findings: Trachea is midline.  Heart size normal.  Emphysema.  No definite airspace consolidation or pleural fluid.  IMPRESSION: Emphysema without acute finding.  Original Report Authenticated By: Reyes Ivan, M.D.   Impression: He has influenza-like syndrome. He may have acute bronchitis. He has COPD. Active Problems:  * No active hospital problems. *      Plan: He will be treated with antibiotics and antivirals continue his other medications and follow.      Rishav Rockefeller L Pager [phone]  09/17/2011, 6:24 PM

## 2011-09-17 NOTE — ED Notes (Signed)
Pt transported to ICU on cardiac monitoring and [facility].  Pt remains stable at this time,

## 2011-09-17 NOTE — ED Notes (Signed)
Short of breath, productive cough, fever

## 2011-09-18 MED ORDER — LISINOPRIL 10 MG PO TABS
10.0000 mg | ORAL_TABLET | Freq: Every day | ORAL | Status: DC
  Administered 2011-09-18 – 2011-09-21 (×4): 10 mg via ORAL
  Filled 2011-09-18 (×4): qty 1

## 2011-09-18 MED ORDER — HYDROCHLOROTHIAZIDE 10 MG/ML ORAL SUSPENSION
6.2500 mg | Freq: Every day | ORAL | Status: DC
  Administered 2011-09-18 – 2011-09-20 (×3): 6.25 mg via ORAL
  Filled 2011-09-18 (×7): qty 1.25

## 2011-09-18 MED ORDER — HYDROCODONE-HOMATROPINE 5-1.5 MG/5ML PO SYRP
5.0000 mL | ORAL_SOLUTION | ORAL | Status: DC | PRN
  Administered 2011-09-18 (×2): 5 mL via ORAL
  Filled 2011-09-18 (×2): qty 5

## 2011-09-18 MED ORDER — METHYLPREDNISOLONE SODIUM SUCC 125 MG IJ SOLR
125.0000 mg | Freq: Four times a day (QID) | INTRAMUSCULAR | Status: DC
  Administered 2011-09-18 – 2011-09-21 (×12): 125 mg via INTRAVENOUS
  Filled 2011-09-18 (×11): qty 2

## 2011-09-18 MED ORDER — ALBUTEROL SULFATE (5 MG/ML) 0.5% IN NEBU
2.5000 mg | INHALATION_SOLUTION | RESPIRATORY_TRACT | Status: DC | PRN
  Administered 2011-09-18 – 2011-09-19 (×3): 2.5 mg via RESPIRATORY_TRACT
  Filled 2011-09-18 (×3): qty 0.5

## 2011-09-18 MED ORDER — SODIUM CHLORIDE 0.9 % IN NEBU
INHALATION_SOLUTION | RESPIRATORY_TRACT | Status: AC
  Filled 2011-09-18: qty 3

## 2011-09-18 NOTE — Progress Notes (Signed)
Subjective: He is awake and alert. He is still coughing. He is bringing up some sputum. He has no other new complaints. He says he feels somewhat better.  Objective: Vital signs in last 24 hours: Temp:  [97.9 F (36.6 C)-99.9 F (37.7 C)] 97.9 F (36.6 C) (01/08 0400) Pulse Rate:  [9-120] 100  (01/08 0600) Resp:  [16-33] 32  (01/08 0600) BP: (88-115)/(43-86) 109/69 mmHg (01/08 0600) SpO2:  [78 %-98 %] 93 % (01/08 0755) FiO2 (%):  [100 %] 100 % (01/07 1528) Weight:  [74.4 kg (164 lb 0.4 oz)-76.204 kg (168 lb)] 165 lb 9.1 oz (75.1 kg) (01/08 0500) Weight change:  Last BM Date: 09/16/11  Intake/Output from previous day: 01/07 0701 - 01/08 0700 In: 1994.6 [P.O.:480; I.V.:1514.6] Out: 1000 [Urine:1000]  PHYSICAL EXAM General appearance: alert, cooperative and mild distress Resp: wheezes bilaterally Cardio: regular rate and rhythm, S1, S2 normal, no murmur, click, rub or gallop GI: soft, non-tender; bowel sounds normal; no masses,  no organomegaly Extremities: extremities normal, atraumatic, no cyanosis or edema  Lab Results:    Basic Metabolic Panel:  Basename 09/17/11 1212  NA 136  K 4.5  CL 98  CO2 34*  GLUCOSE 114*  BUN 19  CREATININE 1.30  CALCIUM 9.2  MG --  PHOS --   Liver Function Tests: No results found for this basename: AST:2,ALT:2,ALKPHOS:2,BILITOT:2,PROT:2,ALBUMIN:2 in the last 72 hours No results found for this basename: LIPASE:2,AMYLASE:2 in the last 72 hours No results found for this basename: AMMONIA:2 in the last 72 hours CBC:  Basename 09/17/11 1212  WBC 17.2*  NEUTROABS 14.9*  HGB 13.3  HCT 41.5  MCV 95.8  PLT 243   Cardiac Enzymes: No results found for this basename: CKTOTAL:3,CKMB:3,CKMBINDEX:3,TROPONINI:3 in the last 72 hours BNP: No results found for this basename: PROBNP:3 in the last 72 hours D-Dimer: No results found for this basename: DDIMER:2 in the last 72 hours CBG: No results found for this basename: GLUCAP:6 in the last  72 hours Hemoglobin A1C: No results found for this basename: HGBA1C in the last 72 hours Fasting Lipid Panel: No results found for this basename: CHOL,HDL,LDLCALC,TRIG,CHOLHDL,LDLDIRECT in the last 72 hours Thyroid Function Tests: No results found for this basename: TSH,T4TOTAL,FREET4,T3FREE,THYROIDAB in the last 72 hours Anemia Panel: No results found for this basename: VITAMINB12,FOLATE,FERRITIN,TIBC,IRON,RETICCTPCT in the last 72 hours Coagulation: No results found for this basename: LABPROT:2,INR:2 in the last 72 hours Urine Drug Screen: Drugs of Abuse  No results found for this basename: labopia, cocainscrnur, labbenz, amphetmu, thcu, labbarb    Alcohol Level: No results found for this basename: ETH:2 in the last 72 hours Urinalysis:  Misc. Labs:  ABGS No results found for this basename: PHART,PCO2,PO2ART,TCO2,HCO3 in the last 72 hours CULTURES Recent Results (from the past 240 hour(s))  MRSA PCR SCREENING     Status: Normal   Collection Time   09/17/11  3:39 PM      Component Value Range Status Comment   MRSA by PCR NEGATIVE  NEGATIVE  Final    Studies/Results: Dg Pneumonia Chest Port1v  09/17/2011  *RADIOLOGY REPORT*  Clinical Data: Productive cough and shortness of breath.  Fever.  PORTABLE CHEST - 1 VIEW  Comparison: 12/19/2009.  Findings: Trachea is midline.  Heart size normal.  Emphysema.  No definite airspace consolidation or pleural fluid.  IMPRESSION: Emphysema without acute finding.  Original Report Authenticated By: Reyes Ivan, M.D.    Medications:  Prior to Admission:  Prescriptions prior to admission  Medication Sig Dispense Refill  .  acetaminophen (TYLENOL) 500 MG tablet Take 1,000 mg by mouth every 6 (six) hours as needed. For pain       . albuterol (PROVENTIL HFA;VENTOLIN HFA) 108 (90 BASE) MCG/ACT inhaler Inhale 2 puffs into the lungs every 6 (six) hours as needed. For shortness of breath       . aspirin EC 81 MG tablet Take 81 mg by mouth daily.         . cyclobenzaprine (FLEXERIL) 10 MG tablet Take 10 mg by mouth 2 (two) times daily.        . Fluticasone-Salmeterol (ADVAIR) 250-50 MCG/DOSE AEPB Inhale 1 puff into the lungs every 12 (twelve) hours.        Marland Kitchen lisinopril-hydrochlorothiazide (PRINZIDE,ZESTORETIC) 20-12.5 MG per tablet Take 0.5 tablets by mouth daily.        . Milnacipran (SAVELLA) 50 MG TABS Take 50 mg by mouth 2 (two) times daily.        . tadalafil (CIALIS) 5 MG tablet Take 5 mg by mouth daily.        Marland Kitchen tiotropium (SPIRIVA) 18 MCG inhalation capsule Place 18 mcg into inhaler and inhale daily.        . traMADol (ULTRAM) 50 MG tablet Take 50 mg by mouth every 6 (six) hours as needed. For pain        Scheduled:   . sodium chloride   Intravenous STAT  . albuterol  2.5 mg Nebulization Q6H  . albuterol  5 mg Nebulization Once  . albuterol  5 mg Nebulization Once  . chlorpheniramine-HYDROcodone  5 mL Oral Once  . cyclobenzaprine  10 mg Oral BID  . enoxaparin  40 mg Subcutaneous Q24H  . Fluticasone-Salmeterol  1 puff Inhalation Q12H  . guaiFENesin  600 mg Oral BID  . lisinopril  20 mg Oral Daily   And  . hydrochlorothiazide  12.5 mg Oral Daily  . ipratropium  0.5 mg Nebulization Once  . ipratropium  0.5 mg Nebulization Q6H  . Milnacipran  50 mg Oral BID  . moxifloxacin  400 mg Intravenous Q24H  . oseltamivir  75 mg Oral Once  . oseltamivir  75 mg Oral BID  . predniSONE  60 mg Oral Once  . tiotropium  18 mcg Inhalation Daily  . DISCONTD: lisinopril-hydrochlorothiazide  1 tablet Oral Daily   Continuous:   . sodium chloride     WUJ:[id number], acetaminophen, alum & mag hydroxide-simeth, HYDROcodone-acetaminophen, ondansetron (ZOFRAN) IV, ondansetron, traMADol, zolpidem  Assesment: He was admitted with acute bronchitis on top of COPD. It was thought that he had influenza but his influenza swab was negative. He is improving but slowly. Active Problems:  * No active hospital problems. *     Plan: I will have him  start on steroids discontinue Tamiflu continue his other treatments.    LOS: 1 day   Alacia Rehmann L 09/18/2011, 8:08 AM

## 2011-09-19 DIAGNOSIS — I10 Essential (primary) hypertension: Secondary | ICD-10-CM | POA: Diagnosis present

## 2011-09-19 DIAGNOSIS — J441 Chronic obstructive pulmonary disease with (acute) exacerbation: Secondary | ICD-10-CM | POA: Diagnosis present

## 2011-09-19 DIAGNOSIS — J209 Acute bronchitis, unspecified: Secondary | ICD-10-CM | POA: Diagnosis present

## 2011-09-19 DIAGNOSIS — M797 Fibromyalgia: Secondary | ICD-10-CM | POA: Diagnosis present

## 2011-09-19 NOTE — Progress Notes (Signed)
Subjective: He says he feels about 60-75% back to normal but still has a significant cough. He still short of breath with ambulation. He's not coughing much of anything up.  Objective: Vital signs in last 24 hours: Temp:  [97 F (36.1 C)-97.5 F (36.4 C)] 97.3 F (36.3 C) (01/09 0400) Pulse Rate:  [80-117] 103  (01/09 0600) Resp:  [18-34] 20  (01/09 0600) BP: (70-148)/(45-131) 110/73 mmHg (01/09 0600) SpO2:  [68 %-100 %] 99 % (01/09 0712) Weight:  [78.6 kg (173 lb 4.5 oz)] 78.6 kg (173 lb 4.5 oz) (01/09 0400) Weight change: 2.396 kg (5 lb 4.5 oz) Last BM Date: 09/16/11  Intake/Output from previous day: 01/08 0701 - 01/09 0700 In: 4205 [P.O.:1080; I.V.:2875; IV Piggyback:250] Out: 925 [Urine:925]  PHYSICAL EXAM General appearance: alert, cooperative and moderate distress Resp: rhonchi bilaterally Cardio: regular rate and rhythm, S1, S2 normal, no murmur, click, rub or gallop GI: soft, non-tender; bowel sounds normal; no masses,  no organomegaly Extremities: extremities normal, atraumatic, no cyanosis or edema  Lab Results:    Basic Metabolic Panel:  Basename 09/17/11 1212  NA 136  K 4.5  CL 98  CO2 34*  GLUCOSE 114*  BUN 19  CREATININE 1.30  CALCIUM 9.2  MG --  PHOS --   Liver Function Tests: No results found for this basename: AST:2,ALT:2,ALKPHOS:2,BILITOT:2,PROT:2,ALBUMIN:2 in the last 72 hours No results found for this basename: LIPASE:2,AMYLASE:2 in the last 72 hours No results found for this basename: AMMONIA:2 in the last 72 hours CBC:  Basename 09/17/11 1212  WBC 17.2*  NEUTROABS 14.9*  HGB 13.3  HCT 41.5  MCV 95.8  PLT 243   Cardiac Enzymes: No results found for this basename: CKTOTAL:3,CKMB:3,CKMBINDEX:3,TROPONINI:3 in the last 72 hours BNP: No results found for this basename: PROBNP:3 in the last 72 hours D-Dimer: No results found for this basename: DDIMER:2 in the last 72 hours CBG: No results found for this basename: GLUCAP:6 in the last  72 hours Hemoglobin A1C: No results found for this basename: HGBA1C in the last 72 hours Fasting Lipid Panel: No results found for this basename: CHOL,HDL,LDLCALC,TRIG,CHOLHDL,LDLDIRECT in the last 72 hours Thyroid Function Tests: No results found for this basename: TSH,T4TOTAL,FREET4,T3FREE,THYROIDAB in the last 72 hours Anemia Panel: No results found for this basename: VITAMINB12,FOLATE,FERRITIN,TIBC,IRON,RETICCTPCT in the last 72 hours Coagulation: No results found for this basename: LABPROT:2,INR:2 in the last 72 hours Urine Drug Screen: Drugs of Abuse  No results found for this basename: labopia, cocainscrnur, labbenz, amphetmu, thcu, labbarb    Alcohol Level: No results found for this basename: ETH:2 in the last 72 hours Urinalysis:  Misc. Labs:  ABGS No results found for this basename: PHART,PCO2,PO2ART,TCO2,HCO3 in the last 72 hours CULTURES Recent Results (from the past 240 hour(s))  CULTURE, BLOOD (ROUTINE X 2)     Status: Normal (Preliminary result)   Collection Time   09/17/11 12:13 PM      Component Value Range Status Comment   Specimen Description BLOOD LEFT ANTECUBITAL DRAWN BY RN   Final    Special Requests BOTTLES DRAWN AEROBIC AND ANAEROBIC 4CC EACH   Final    Culture NO GROWTH 1 DAY   Final    Report Status PENDING   Incomplete   CULTURE, BLOOD (ROUTINE X 2)     Status: Normal (Preliminary result)   Collection Time   09/17/11 12:26 PM      Component Value Range Status Comment   Specimen Description BLOOD RIGHT ANTECUBITAL   Final  Special Requests BOTTLES DRAWN AEROBIC AND ANAEROBIC 4CC   Final    Culture NO GROWTH 1 DAY   Final    Report Status PENDING   Incomplete   MRSA PCR SCREENING     Status: Normal   Collection Time   09/17/11  3:39 PM      Component Value Range Status Comment   MRSA by PCR NEGATIVE  NEGATIVE  Final    Studies/Results: Dg Pneumonia Chest Port1v  09/17/2011  *RADIOLOGY REPORT*  Clinical Data: Productive cough and shortness of  breath.  Fever.  PORTABLE CHEST - 1 VIEW  Comparison: 12/19/2009.  Findings: Trachea is midline.  Heart size normal.  Emphysema.  No definite airspace consolidation or pleural fluid.  IMPRESSION: Emphysema without acute finding.  Original Report Authenticated By: Reyes Ivan, M.D.    Medications:  Prior to Admission:  Prescriptions prior to admission  Medication Sig Dispense Refill  . acetaminophen (TYLENOL) 500 MG tablet Take 1,000 mg by mouth every 6 (six) hours as needed. For pain       . albuterol (PROVENTIL HFA;VENTOLIN HFA) 108 (90 BASE) MCG/ACT inhaler Inhale 2 puffs into the lungs every 6 (six) hours as needed. For shortness of breath       . aspirin EC 81 MG tablet Take 81 mg by mouth daily.        . cyclobenzaprine (FLEXERIL) 10 MG tablet Take 10 mg by mouth 2 (two) times daily.        . Fluticasone-Salmeterol (ADVAIR) 250-50 MCG/DOSE AEPB Inhale 1 puff into the lungs every 12 (twelve) hours.        Marland Kitchen lisinopril-hydrochlorothiazide (PRINZIDE,ZESTORETIC) 20-12.5 MG per tablet Take 0.5 tablets by mouth daily.        . Milnacipran (SAVELLA) 50 MG TABS Take 50 mg by mouth 2 (two) times daily.        . tadalafil (CIALIS) 5 MG tablet Take 5 mg by mouth daily.        Marland Kitchen tiotropium (SPIRIVA) 18 MCG inhalation capsule Place 18 mcg into inhaler and inhale daily.        . traMADol (ULTRAM) 50 MG tablet Take 50 mg by mouth every 6 (six) hours as needed. For pain        Scheduled:   . albuterol  2.5 mg Nebulization Q6H  . cyclobenzaprine  10 mg Oral BID  . enoxaparin  40 mg Subcutaneous Q24H  . Fluticasone-Salmeterol  1 puff Inhalation Q12H  . guaiFENesin  600 mg Oral BID  . hydrochlorothiazide  6.25 mg Oral Daily   And  . lisinopril  10 mg Oral Daily  . ipratropium  0.5 mg Nebulization Q6H  . methylPREDNISolone (SOLU-MEDROL) injection  125 mg Intravenous Q6H  . Milnacipran  50 mg Oral BID  . moxifloxacin  400 mg Intravenous Q24H  . sodium chloride      . tiotropium  18 mcg  Inhalation Daily  . DISCONTD: hydrochlorothiazide  12.5 mg Oral Daily  . DISCONTD: lisinopril  20 mg Oral Daily  . DISCONTD: oseltamivir  75 mg Oral BID   Continuous:   . sodium chloride 125 mL/hr at 09/19/11 0600   YQM:[id number], acetaminophen, albuterol, alum & mag hydroxide-simeth, HYDROcodone-acetaminophen, HYDROcodone-homatropine, ondansetron (ZOFRAN) IV, ondansetron, traMADol, zolpidem  Assesment: He has COPD exacerbation with acute bronchitis. It was thought he might have influenza B was negative for influenza by PCR. He has hypertension which is well controlled. Active Problems:  * No active hospital problems. *  Plan: No change in treatments I will decrease his IV fluids and hopefully maybe a home tomorrow    LOS: 2 days   Delbra Zellars L 09/19/2011, 7:57 AM

## 2011-09-20 NOTE — Progress Notes (Signed)
Connor Armstrong, Connor Armstrong             ACCOUNT NO.:  [id number]  MEDICAL RECORD NO.:  [id number]  LOCATION:  A321                          FACILITY:  [facility]  PHYSICIAN:  Trevar Boehringer L. Juanetta Gosling, M.D.DATE OF BIRTH:  05/26/1951  DATE OF PROCEDURE: DATE OF DISCHARGE:                                PROGRESS NOTE   Connor Armstrong is admitted with COPD exacerbation.  It was initially thought that this might be related to influenza, but he was negative for influenza by testing.  He says he feels a little bit better, but he is still having significant trouble with shortness of breath.  He has done much better initially, but then he developed increased shortness of breath yesterday afternoon.  PHYSICAL EXAMINATION:  GENERAL:  He is awake and alert.  He looks mildly uncomfortable.  He is still wheezing a great deal. HEART:  Regular. ABDOMEN:  Soft and he is not in acute distress.  ASSESSMENT:  Then is that, he has chronic obstructive pulmonary disease exacerbation and he is going to continue with his medications and treatments.  I do not think he can be discharged home because of his increased symptoms.  He understands and I will have him continue with what he is doing otherwise.  I did not change any other treatments today.     Walter Grima L. Juanetta Gosling, M.D.     ELH/MEDQ  D:  09/20/2011  T:  09/20/2011  Job:  [id number]

## 2011-09-21 MED ORDER — METHYLPREDNISOLONE 4 MG PO KIT: PACK | ORAL | Status: DC

## 2011-09-21 MED ORDER — CEFUROXIME AXETIL 500 MG PO TABS: 500.0000 mg | ORAL_TABLET | Freq: Two times a day (BID) | ORAL | Status: DC

## 2011-09-21 NOTE — Progress Notes (Signed)
CARE MANAGEMENT NOTE 09/21/2011  Patient:  Connor Armstrong, Connor Armstrong   Account Number:  [id number]  Date Initiated:  09/21/2011  Documentation initiated by:  Rosemary Holms  Subjective/Objective Assessment:   Pt admitted with SOB/Pneumonia. Prior to admission lived at home independently     Action/Plan:   Pt to DC. No HH needs identified.   Anticipated DC Date:  09/21/2011   Anticipated DC Plan:           Choice offered to / List presented to:             Status of service:  Completed, signed off Medicare Important Message given?   (If response is "NO", the following Medicare IM given date fields will be blank) Date Medicare IM given:   Date Additional Medicare IM given:    Discharge Disposition:  HOME W HOME HEALTH SERVICES  Per UR Regulation:    Comments:  09/21/11 1000 Ryka Beighley Leanord Hawking RN BSN

## 2011-09-21 NOTE — Progress Notes (Signed)
Discharge instructions given and explained. Understanding verbalized by patient and family member. Prescription given to patient. Discharge home with no complaints. Patient was assisted out by staff and transported home by private vehicle. Patient in stable condition at time of discharge.  

## 2011-09-21 NOTE — Progress Notes (Signed)
Subjective: He says he feels much better. Yesterday he had a little bit more wheezing. He is less congested and overall is improved and says he feels like he is back to baseline.  Objective: Vital signs in last 24 hours: Temp:  [97.4 F (36.3 C)-98 F (36.7 C)] 98 F (36.7 C) (01/11 0617) Pulse Rate:  [98-103] 98  (01/11 0617) Resp:  [20-22] 20  (01/11 0617) BP: (134-151)/(80-85) 148/82 mmHg (01/11 0617) SpO2:  [95 %] 95 % (01/11 0617) FiO2 (%):  [96 %-99 %] 97 % (01/11 0729) Weight:  [79.3 kg (174 lb 13.2 oz)] 79.3 kg (174 lb 13.2 oz) (01/11 0617) Weight change: 0.2 kg (7.1 oz) Last BM Date: 09/20/11  Intake/Output from previous day: 01/10 0701 - 01/11 0700 In: 1000 [P.O.:1000] Out: -   PHYSICAL EXAM General appearance: alert, cooperative and no distress Resp: clear to auscultation bilaterally Cardio: regular rate and rhythm, S1, S2 normal, no murmur, click, rub or gallop GI: soft, non-tender; bowel sounds normal; no masses,  no organomegaly Extremities: extremities normal, atraumatic, no cyanosis or edema  Lab Results:    Basic Metabolic Panel: No results found for this basename: NA:2,K:2,CL:2,CO2:2,GLUCOSE:2,BUN:2,CREATININE:2,CALCIUM:2,MG:2,PHOS:2 in the last 72 hours Liver Function Tests: No results found for this basename: AST:2,ALT:2,ALKPHOS:2,BILITOT:2,PROT:2,ALBUMIN:2 in the last 72 hours No results found for this basename: LIPASE:2,AMYLASE:2 in the last 72 hours No results found for this basename: AMMONIA:2 in the last 72 hours CBC: No results found for this basename: WBC:2,NEUTROABS:2,HGB:2,HCT:2,MCV:2,PLT:2 in the last 72 hours Cardiac Enzymes: No results found for this basename: CKTOTAL:3,CKMB:3,CKMBINDEX:3,TROPONINI:3 in the last 72 hours BNP: No results found for this basename: PROBNP:3 in the last 72 hours D-Dimer: No results found for this basename: DDIMER:2 in the last 72 hours CBG: No results found for this basename: GLUCAP:6 in the last 72  hours Hemoglobin A1C: No results found for this basename: HGBA1C in the last 72 hours Fasting Lipid Panel: No results found for this basename: CHOL,HDL,LDLCALC,TRIG,CHOLHDL,LDLDIRECT in the last 72 hours Thyroid Function Tests: No results found for this basename: TSH,T4TOTAL,FREET4,T3FREE,THYROIDAB in the last 72 hours Anemia Panel: No results found for this basename: VITAMINB12,FOLATE,FERRITIN,TIBC,IRON,RETICCTPCT in the last 72 hours Coagulation: No results found for this basename: LABPROT:2,INR:2 in the last 72 hours Urine Drug Screen: Drugs of Abuse  No results found for this basename: labopia, cocainscrnur, labbenz, amphetmu, thcu, labbarb    Alcohol Level: No results found for this basename: ETH:2 in the last 72 hours Urinalysis:  Misc. Labs:  ABGS No results found for this basename: PHART,PCO2,PO2ART,TCO2,HCO3 in the last 72 hours CULTURES Recent Results (from the past 240 hour(s))  CULTURE, BLOOD (ROUTINE X 2)     Status: Normal (Preliminary result)   Collection Time   09/17/11 12:13 PM      Component Value Range Status Comment   Specimen Description BLOOD LEFT ANTECUBITAL DRAWN BY RN   Final    Special Requests BOTTLES DRAWN AEROBIC AND ANAEROBIC 4CC EACH   Final    Culture NO GROWTH 3 DAYS   Final    Report Status PENDING   Incomplete   CULTURE, BLOOD (ROUTINE X 2)     Status: Normal (Preliminary result)   Collection Time   09/17/11 12:26 PM      Component Value Range Status Comment   Specimen Description BLOOD RIGHT ANTECUBITAL   Final    Special Requests BOTTLES DRAWN AEROBIC AND ANAEROBIC 4CC   Final    Culture NO GROWTH 3 DAYS   Final    Report Status  PENDING   Incomplete   MRSA PCR SCREENING     Status: Normal   Collection Time   09/17/11  3:39 PM      Component Value Range Status Comment   MRSA by PCR NEGATIVE  NEGATIVE  Final    Studies/Results: No results found.  Medications:  Prior to Admission:  Prescriptions prior to admission  Medication Sig  Dispense Refill  . acetaminophen (TYLENOL) 500 MG tablet Take 1,000 mg by mouth every 6 (six) hours as needed. For pain       . albuterol (PROVENTIL HFA;VENTOLIN HFA) 108 (90 BASE) MCG/ACT inhaler Inhale 2 puffs into the lungs every 6 (six) hours as needed. For shortness of breath       . aspirin EC 81 MG tablet Take 81 mg by mouth daily.        . cyclobenzaprine (FLEXERIL) 10 MG tablet Take 10 mg by mouth 2 (two) times daily.        . Fluticasone-Salmeterol (ADVAIR) 250-50 MCG/DOSE AEPB Inhale 1 puff into the lungs every 12 (twelve) hours.        Marland Kitchen lisinopril-hydrochlorothiazide (PRINZIDE,ZESTORETIC) 20-12.5 MG per tablet Take 0.5 tablets by mouth daily.        . Milnacipran (SAVELLA) 50 MG TABS Take 50 mg by mouth 2 (two) times daily.        . tadalafil (CIALIS) 5 MG tablet Take 5 mg by mouth daily.        Marland Kitchen tiotropium (SPIRIVA) 18 MCG inhalation capsule Place 18 mcg into inhaler and inhale daily.        . traMADol (ULTRAM) 50 MG tablet Take 50 mg by mouth every 6 (six) hours as needed. For pain        Scheduled:   . albuterol  2.5 mg Nebulization Q6H  . cyclobenzaprine  10 mg Oral BID  . enoxaparin  40 mg Subcutaneous Q24H  . Fluticasone-Salmeterol  1 puff Inhalation Q12H  . guaiFENesin  600 mg Oral BID  . hydrochlorothiazide  6.25 mg Oral Daily   And  . lisinopril  10 mg Oral Daily  . methylPREDNISolone (SOLU-MEDROL) injection  125 mg Intravenous Q6H  . Milnacipran  50 mg Oral BID  . moxifloxacin  400 mg Intravenous Q24H  . tiotropium  18 mcg Inhalation Daily   Continuous:   . sodium chloride 50 mL/hr at 09/19/11 1704   VHQ:[id number], acetaminophen, albuterol, alum & mag hydroxide-simeth, HYDROcodone-acetaminophen, HYDROcodone-homatropine, ondansetron (ZOFRAN) IV, ondansetron, traMADol, zolpidem  Assesment: He is admitted with acute exacerbation of COPD. He is better. He is ready for discharge. Principal Problem:  *Acute exacerbation of chronic obstructive pulmonary disease  (COPD) Active Problems:  Acute bronchitis  Hypertension  Fibromyalgia    Plan: Discharge home today    LOS: 4 days   Anterrio Mccleery L 09/21/2011, 8:23 AM

## 2011-09-22 NOTE — Discharge Summary (Signed)
Physician Discharge Summary  Patient ID: Connor Armstrong MRN: [id number] DOB/AGE: 61-Sep-1952 61 y.o. Primary Care Physician:No primary provider on file. Admit date: 09/17/2011 Discharge date: 09/22/2011    Discharge Diagnoses:   Principal Problem:  *Acute exacerbation of chronic obstructive pulmonary disease (COPD) Active Problems:  Acute bronchitis  Hypertension  Fibromyalgia   Discharge Medication List as of 09/21/2011  9:45 AM    START taking these medications   Details  cefUROXime (CEFTIN) 500 MG tablet Take 1 tablet (500 mg total) by mouth 2 (two) times daily., Starting 09/21/2011, Until Mon 10/01/11, Normal    methylPREDNISolone (MEDROL, PAK,) 4 MG tablet follow package directions, Normal      CONTINUE these medications which have NOT CHANGED   Details  acetaminophen (TYLENOL) 500 MG tablet Take 1,000 mg by mouth every 6 (six) hours as needed. For pain , Until Discontinued, Historical Med    albuterol (PROVENTIL HFA;VENTOLIN HFA) 108 (90 BASE) MCG/ACT inhaler Inhale 2 puffs into the lungs every 6 (six) hours as needed. For shortness of breath , Until Discontinued, Historical Med    aspirin EC 81 MG tablet Take 81 mg by mouth daily.  , Until Discontinued, Historical Med    cyclobenzaprine (FLEXERIL) 10 MG tablet Take 10 mg by mouth 2 (two) times daily.  , Until Discontinued, Historical Med    Fluticasone-Salmeterol (ADVAIR) 250-50 MCG/DOSE AEPB Inhale 1 puff into the lungs every 12 (twelve) hours.  , Until Discontinued, Historical Med    lisinopril-hydrochlorothiazide (PRINZIDE,ZESTORETIC) 20-12.5 MG per tablet Take 0.5 tablets by mouth daily.  , Until Discontinued, Historical Med    Milnacipran (SAVELLA) 50 MG TABS Take 50 mg by mouth 2 (two) times daily.  , Until Discontinued, Historical Med    tadalafil (CIALIS) 5 MG tablet Take 5 mg by mouth daily.  , Until Discontinued, Historical Med    tiotropium (SPIRIVA) 18 MCG inhalation capsule Place 18 mcg into inhaler and  inhale daily.  , Until Discontinued, Historical Med    traMADol (ULTRAM) 50 MG tablet Take 50 mg by mouth every 6 (six) hours as needed. For pain , Until Discontinued, Historical Med        Discharged Condition: Improved    Consults: None  Significant Diagnostic Studies: Dg Pneumonia Chest Port1v  09/17/2011  *RADIOLOGY REPORT*  Clinical Data: Productive cough and shortness of breath.  Fever.  PORTABLE CHEST - 1 VIEW  Comparison: 12/19/2009.  Findings: Trachea is midline.  Heart size normal.  Emphysema.  No definite airspace consolidation or pleural fluid.  IMPRESSION: Emphysema without acute finding.  Original Report Authenticated By: Reyes Ivan, M.D.    Lab Results: Basic Metabolic Panel: No results found for this basename: NA:2,K:2,CL:2,CO2:2,GLUCOSE:2,BUN:2,CREATININE:2,CALCIUM:2,MG:2,PHOS:2 in the last 72 hours Liver Function Tests: No results found for this basename: AST:2,ALT:2,ALKPHOS:2,BILITOT:2,PROT:2,ALBUMIN:2 in the last 72 hours   CBC: No results found for this basename: WBC:2,NEUTROABS:2,HGB:2,HCT:2,MCV:2,PLT:2 in the last 72 hours  Recent Results (from the past 240 hour(s))  CULTURE, BLOOD (ROUTINE X 2)     Status: Normal (Preliminary result)   Collection Time   09/17/11 12:13 PM      Component Value Range Status Comment   Specimen Description BLOOD LEFT ANTECUBITAL DRAWN BY RN   Final    Special Requests BOTTLES DRAWN AEROBIC AND ANAEROBIC 4CC EACH   Final    Culture NO GROWTH 4 DAYS   Final    Report Status PENDING   Incomplete   CULTURE, BLOOD (ROUTINE X 2)     Status: Normal (  Preliminary result)   Collection Time   09/17/11 12:26 PM      Component Value Range Status Comment   Specimen Description BLOOD RIGHT ANTECUBITAL   Final    Special Requests BOTTLES DRAWN AEROBIC AND ANAEROBIC 4CC   Final    Culture NO GROWTH 4 DAYS   Final    Report Status PENDING   Incomplete   MRSA PCR SCREENING     Status: Normal   Collection Time   09/17/11  3:39 PM       Component Value Range Status Comment   MRSA by PCR NEGATIVE  NEGATIVE  Final      Hospital Course: He was admitted and initially thought to have influenza but is influenza PCR was negative. He was started initially on Tamiflu but that was discontinued. He was started on intravenous antibiotics IV steroids inhaled bronchodilators and slowly improved. He was back to baseline at the time of discharge. He still had some cough  Discharge Exam: Blood pressure 148/82, pulse 98, temperature 98 F (36.7 C), temperature source Oral, resp. rate 20, height 5\' 10"  (1.778 m), weight 79.3 kg (174 lb 13.2 oz), SpO2 95.00%. He was awake and alert. He was moving air much better. He had some cough and minimal end expiratory wheezes which is his baseline.  Disposition: Home. We discussed home health services and he did not want that      Signed: Takira Sherrin L Pager [phone]  09/22/2011, 7:37 AM

## 2011-09-24 ENCOUNTER — Emergency Department (HOSPITAL_COMMUNITY): Payer: Medicaid Other

## 2011-09-24 ENCOUNTER — Inpatient Hospital Stay (HOSPITAL_COMMUNITY)
Admission: EM | Admit: 2011-09-24 | Discharge: 2011-09-27 | DRG: 190 | Disposition: A | Discharge status: Home / Self Care | Payer: Medicaid Other | Attending: Pulmonary Disease | Admitting: Pulmonary Disease

## 2011-09-24 ENCOUNTER — Encounter (HOSPITAL_COMMUNITY): Payer: Self-pay

## 2011-09-24 DIAGNOSIS — IMO0001 Reserved for inherently not codable concepts without codable children: Secondary | ICD-10-CM | POA: Diagnosis present

## 2011-09-24 DIAGNOSIS — M797 Fibromyalgia: Secondary | ICD-10-CM | POA: Diagnosis present

## 2011-09-24 DIAGNOSIS — J441 Chronic obstructive pulmonary disease with (acute) exacerbation: Principal | ICD-10-CM | POA: Diagnosis present

## 2011-09-24 DIAGNOSIS — J9620 Acute and chronic respiratory failure, unspecified whether with hypoxia or hypercapnia: Secondary | ICD-10-CM | POA: Diagnosis present

## 2011-09-24 DIAGNOSIS — I10 Essential (primary) hypertension: Secondary | ICD-10-CM | POA: Diagnosis present

## 2011-09-24 LAB — DIFFERENTIAL
Basophils Relative: 0 % (ref 0–1)
Eosinophils Absolute: 0.2 10*3/uL (ref 0.0–0.7)
Lymphocytes Relative: 6 % — ABNORMAL LOW (ref 12–46)
Monocytes Relative: 8 % (ref 3–12)
Neutrophils Relative %: 85 % — ABNORMAL HIGH (ref 43–77)

## 2011-09-24 LAB — BASIC METABOLIC PANEL
BUN: 20 mg/dL (ref 6–23)
Chloride: 89 mEq/L — ABNORMAL LOW (ref 96–112)
GFR calc Af Amer: 85 mL/min — ABNORMAL LOW (ref >90)
Potassium: 4.9 mEq/L (ref 3.5–5.1)

## 2011-09-24 LAB — CBC
Hemoglobin: 13.9 g/dL (ref 13.0–17.0)
MCHC: 31.2 g/dL (ref 30.0–36.0)
RBC: 4.62 MIL/uL (ref 4.22–5.81)
WBC: 15.2 10*3/uL — ABNORMAL HIGH (ref 4.0–10.5)

## 2011-09-24 LAB — BLOOD GAS, ARTERIAL
Bicarbonate: 45.8 mEq/L — ABNORMAL HIGH (ref 20.0–24.0)
FIO2: 0.28 %
Patient temperature: 37
pH, Arterial: 7.346 — ABNORMAL LOW (ref 7.350–7.450)

## 2011-09-24 MED ORDER — ACETAMINOPHEN 650 MG RE SUPP: 650.0000 mg | Freq: Four times a day (QID) | RECTAL | Status: DC | PRN

## 2011-09-24 MED ORDER — ENOXAPARIN SODIUM 40 MG/0.4ML ~~LOC~~ SOLN
40.0000 mg | Freq: Every day | SUBCUTANEOUS | Status: DC
  Administered 2011-09-24 – 2011-09-27 (×4): 40 mg via SUBCUTANEOUS
  Filled 2011-09-24 (×4): qty 0.4

## 2011-09-24 MED ORDER — SODIUM CHLORIDE 0.9 % IV SOLN
INTRAVENOUS | Status: AC
  Administered 2011-09-24: 15:00:00 via INTRAVENOUS
  Administered 2011-09-24: 500 mL via INTRAVENOUS

## 2011-09-24 MED ORDER — SODIUM CHLORIDE 0.9 % IV SOLN
Freq: Once | INTRAVENOUS | Status: AC
  Administered 2011-09-24: 12:00:00 via INTRAVENOUS

## 2011-09-24 MED ORDER — VANCOMYCIN HCL IN DEXTROSE 1-5 GM/200ML-% IV SOLN
1000.0000 mg | Freq: Two times a day (BID) | INTRAVENOUS | Status: DC
  Administered 2011-09-24 – 2011-09-26 (×5): 1000 mg via INTRAVENOUS
  Filled 2011-09-24 (×6): qty 200

## 2011-09-24 MED ORDER — ALBUTEROL SULFATE (5 MG/ML) 0.5% IN NEBU
5.0000 mg | INHALATION_SOLUTION | Freq: Once | RESPIRATORY_TRACT | Status: AC
  Administered 2011-09-24: 5 mg via RESPIRATORY_TRACT
  Filled 2011-09-24: qty 1

## 2011-09-24 MED ORDER — ALBUTEROL SULFATE (5 MG/ML) 0.5% IN NEBU
2.5000 mg | INHALATION_SOLUTION | RESPIRATORY_TRACT | Status: DC
  Administered 2011-09-24 – 2011-09-27 (×12): 2.5 mg via RESPIRATORY_TRACT
  Filled 2011-09-24 (×12): qty 0.5
  Filled 2011-09-24: qty 20
  Filled 2011-09-24 (×2): qty 0.5
  Filled 2011-09-24: qty 20
  Filled 2011-09-24 (×5): qty 0.5
  Filled 2011-09-24: qty 20
  Filled 2011-09-24 (×3): qty 0.5

## 2011-09-24 MED ORDER — ONDANSETRON HCL 4 MG PO TABS: 4.0000 mg | ORAL_TABLET | Freq: Four times a day (QID) | ORAL | Status: DC | PRN

## 2011-09-24 MED ORDER — PIPERACILLIN-TAZOBACTAM 3.375 G IVPB
3.3750 g | Freq: Three times a day (TID) | INTRAVENOUS | Status: DC
  Administered 2011-09-24 – 2011-09-26 (×7): 3.375 g via INTRAVENOUS
  Filled 2011-09-24 (×11): qty 50

## 2011-09-24 MED ORDER — ALBUTEROL SULFATE (5 MG/ML) 0.5% IN NEBU
2.5000 mg | INHALATION_SOLUTION | RESPIRATORY_TRACT | Status: AC | PRN
  Filled 2011-09-24: qty 0.5

## 2011-09-24 MED ORDER — ALBUTEROL SULFATE (5 MG/ML) 0.5% IN NEBU
2.5000 mg | INHALATION_SOLUTION | Freq: Four times a day (QID) | RESPIRATORY_TRACT | Status: DC
  Filled 2011-09-24 (×9): qty 0.5

## 2011-09-24 MED ORDER — MILNACIPRAN HCL 50 MG PO TABS
50.0000 mg | ORAL_TABLET | Freq: Two times a day (BID) | ORAL | Status: DC
  Administered 2011-09-24 – 2011-09-25 (×3): 50 mg via ORAL
  Filled 2011-09-24 (×5): qty 1

## 2011-09-24 MED ORDER — ALBUTEROL SULFATE (5 MG/ML) 0.5% IN NEBU
2.5000 mg | INHALATION_SOLUTION | RESPIRATORY_TRACT | Status: DC | PRN
  Administered 2011-09-24: 2.5 mg via RESPIRATORY_TRACT
  Filled 2011-09-24: qty 0.5
  Filled 2011-09-24: qty 20

## 2011-09-24 MED ORDER — IPRATROPIUM BROMIDE 0.02 % IN SOLN
0.5000 mg | Freq: Once | RESPIRATORY_TRACT | Status: AC
  Administered 2011-09-24: 0.5 mg via RESPIRATORY_TRACT
  Filled 2011-09-24: qty 2.5

## 2011-09-24 MED ORDER — IPRATROPIUM BROMIDE 0.02 % IN SOLN
0.5000 mg | RESPIRATORY_TRACT | Status: DC
  Administered 2011-09-24 – 2011-09-27 (×10): 0.5 mg via RESPIRATORY_TRACT
  Filled 2011-09-24 (×13): qty 2.5

## 2011-09-24 MED ORDER — SODIUM CHLORIDE 0.9 % IV SOLN
INTRAVENOUS | Status: DC
  Administered 2011-09-25: 1000 mL via INTRAVENOUS
  Administered 2011-09-26 – 2011-09-27 (×2): via INTRAVENOUS

## 2011-09-24 MED ORDER — TRETINOIN 10 MG PO CAPS
10.0000 mg | ORAL_CAPSULE | Freq: Every day | ORAL | Status: DC
  Filled 2011-09-24 (×2): qty 1

## 2011-09-24 MED ORDER — PANTOPRAZOLE SODIUM 40 MG IV SOLR
40.0000 mg | INTRAVENOUS | Status: DC
  Administered 2011-09-24 – 2011-09-25 (×2): 40 mg via INTRAVENOUS
  Filled 2011-09-24 (×2): qty 40

## 2011-09-24 MED ORDER — ASPIRIN 81 MG PO CHEW
81.0000 mg | CHEWABLE_TABLET | Freq: Every day | ORAL | Status: DC
  Administered 2011-09-24 – 2011-09-27 (×4): 81 mg via ORAL
  Filled 2011-09-24 (×4): qty 1

## 2011-09-24 MED ORDER — CYCLOBENZAPRINE HCL 10 MG PO TABS
10.0000 mg | ORAL_TABLET | Freq: Two times a day (BID) | ORAL | Status: DC
  Administered 2011-09-24 – 2011-09-27 (×6): 10 mg via ORAL
  Filled 2011-09-24 (×6): qty 1

## 2011-09-24 MED ORDER — HYDROCODONE-ACETAMINOPHEN 5-325 MG PO TABS
1.0000 | ORAL_TABLET | ORAL | Status: DC | PRN
  Administered 2011-09-25: 2 via ORAL
  Filled 2011-09-24: qty 2
  Filled 2011-09-24: qty 1

## 2011-09-24 MED ORDER — METHYLPREDNISOLONE SODIUM SUCC 125 MG IJ SOLR
125.0000 mg | Freq: Once | INTRAMUSCULAR | Status: AC
  Administered 2011-09-24: 125 mg via INTRAVENOUS
  Filled 2011-09-24: qty 2

## 2011-09-24 MED ORDER — ACETAMINOPHEN 325 MG PO TABS
650.0000 mg | ORAL_TABLET | Freq: Four times a day (QID) | ORAL | Status: DC | PRN
  Administered 2011-09-24 – 2011-09-26 (×2): 650 mg via ORAL
  Filled 2011-09-24 (×2): qty 2

## 2011-09-24 MED ORDER — TRETINOIN 10 MG PO CAPS: 10.0000 mg | ORAL_CAPSULE | Freq: Every day | ORAL | Status: DC

## 2011-09-24 MED ORDER — ONDANSETRON HCL 4 MG/2ML IJ SOLN: 4.0000 mg | Freq: Four times a day (QID) | INTRAMUSCULAR | Status: DC | PRN

## 2011-09-24 MED ORDER — IPRATROPIUM BROMIDE 0.02 % IN SOLN: 0.5000 mg | Freq: Four times a day (QID) | RESPIRATORY_TRACT | Status: DC

## 2011-09-24 NOTE — H&P (Signed)
Connor Armstrong MRN: [id number] DOB/AGE: 02/09/1951 60 y.o. Primary Care Physician:No primary provider on file. Admit date: 09/24/2011 Chief Complaint: He was discharged 3 days ago with a COPD exacerbation. He initially did fairly well at home and developed increasing shortness of breath and came back. His blood gas showed that his PCO2 was quite high he had a normal pH.think this is a chronic problem. He has been coughing some. HPI: As above. He denies any fever he's not had any chest pain.  Past Medical History  Diagnosis Date  . COPD (chronic obstructive pulmonary disease)   . Hypertension   . Fibromyalgia    Past Surgical History  Procedure Date  . Hernia repair   . Thyroid surgery   . Parathyroid surgery   . Kidney surgery         History reviewed. No pertinent family history.  Social History:  reports that he has been smoking.  He does not have any smokeless tobacco history on file. He reports that he does not drink alcohol or use illicit drugs.   Allergies: No Known Allergies  Medications Prior to Admission  Medication Dose Route Frequency Provider Last Rate Last Dose  . 0.9 %  sodium chloride infusion   Intravenous Once Worthy Rancher, PA 20 mL/hr at 09/24/11 1144    . 0.9 %  sodium chloride infusion   Intravenous STAT Nicoletta Dress. Colon Branch, MD 10 mL/hr at 09/24/11 1515    . 0.9 %  sodium chloride infusion   Intravenous Continuous Fredirick Maudlin, MD 75 mL/hr at 09/24/11 1734    . acetaminophen (TYLENOL) tablet 650 mg  650 mg Oral Q6H PRN Fredirick Maudlin, MD   650 mg at 09/24/11 1800   Or  . acetaminophen (TYLENOL) suppository 650 mg  650 mg Rectal Q6H PRN Fredirick Maudlin, MD      . albuterol (PROVENTIL) (5 MG/ML) 0.5% nebulizer solution 2.5 mg  2.5 mg Nebulization Q4H PRN Nicoletta Dress. Colon Branch, MD      . albuterol (PROVENTIL) (5 MG/ML) 0.5% nebulizer solution 2.5 mg  2.5 mg Nebulization Q2H PRN Fredirick Maudlin, MD   2.5 mg at 09/24/11 1615  . albuterol (PROVENTIL) (5  MG/ML) 0.5% nebulizer solution 2.5 mg  2.5 mg Nebulization Q4H WA Fredirick Maudlin, MD      . albuterol (PROVENTIL) (5 MG/ML) 0.5% nebulizer solution 5 mg  5 mg Nebulization Once Fredirick Maudlin, MD   5 mg at 09/24/11 1106  . aspirin chewable tablet 81 mg  81 mg Oral Daily Fredirick Maudlin, MD   81 mg at 09/24/11 1732  . cyclobenzaprine (FLEXERIL) tablet 10 mg  10 mg Oral BID Fredirick Maudlin, MD   10 mg at 09/24/11 1809  . enoxaparin (LOVENOX) injection 40 mg  40 mg Subcutaneous Daily Fredirick Maudlin, MD   40 mg at 09/24/11 1732  . HYDROcodone-acetaminophen (NORCO) 5-325 MG per tablet 1-2 tablet  1-2 tablet Oral Q4H PRN Fredirick Maudlin, MD      . ipratropium (ATROVENT) nebulizer solution 0.5 mg  0.5 mg Nebulization Once Fredirick Maudlin, MD   0.5 mg at 09/24/11 1106  . ipratropium (ATROVENT) nebulizer solution 0.5 mg  0.5 mg Nebulization Q4H WA Fredirick Maudlin, MD      . methylPREDNISolone sodium succinate (SOLU-MEDROL) 125 MG injection 125 mg  125 mg Intravenous Once Worthy Rancher, PA   125 mg at 09/24/11 1144  . Milnacipran (SAVELLA) tablet TABS 50 mg  50 mg Oral BID Fredirick Maudlin, MD      . ondansetron [facility]) tablet 4 mg  4 mg Oral Q6H PRN Fredirick Maudlin, MD       Or  . ondansetron [facility]) injection 4 mg  4 mg Intravenous Q6H PRN Fredirick Maudlin, MD      . pantoprazole (PROTONIX) injection 40 mg  40 mg Intravenous Q24H Fredirick Maudlin, MD   40 mg at 09/24/11 1732  . piperacillin-tazobactam (ZOSYN) IVPB 3.375 g  3.375 g Intravenous Q8H Francisco J [place], PHARMD   3.375 g at 09/24/11 1733  . tretinoin (VESANOID) capsule 10 mg  10 mg Oral Q breakfast Fredirick Maudlin, MD      . vancomycin (VANCOCIN) IVPB 1000 mg/200 mL premix  1,000 mg Intravenous Q12H Francisco J [place], PHARMD   1,000 mg at 09/24/11 1733  . DISCONTD: albuterol (PROVENTIL) (5 MG/ML) 0.5% nebulizer solution 2.5 mg  2.5 mg Nebulization Q6H Fredirick Maudlin, MD      . DISCONTD: ipratropium (ATROVENT) nebulizer  solution 0.5 mg  0.5 mg Nebulization Q6H Fredirick Maudlin, MD      . DISCONTD: tretinoin (VESANOID) capsule 10 mg  10 mg Oral Daily Fredirick Maudlin, MD       Medications Prior to Admission  Medication Sig Dispense Refill  . acetaminophen (TYLENOL) 500 MG tablet Take 1,000 mg by mouth every 6 (six) hours as needed. For pain       . albuterol (PROVENTIL HFA;VENTOLIN HFA) 108 (90 BASE) MCG/ACT inhaler Inhale 2 puffs into the lungs every 6 (six) hours as needed. For shortness of breath       . cefUROXime (CEFTIN) 500 MG tablet Take 1 tablet (500 mg total) by mouth 2 (two) times daily.  20 tablet  0  . cyclobenzaprine (FLEXERIL) 10 MG tablet Take 10 mg by mouth 2 (two) times daily.        . Fluticasone-Salmeterol (ADVAIR) 250-50 MCG/DOSE AEPB Inhale 1 puff into the lungs every 12 (twelve) hours.        Marland Kitchen lisinopril-hydrochlorothiazide (PRINZIDE,ZESTORETIC) 20-12.5 MG per tablet Take 0.5 tablets by mouth daily.        . methylPREDNISolone (MEDROL DOSEPAK) 4 MG tablet Take by mouth as directed. Day 1: 2 tabs before breakfast, 1 tab after lunch, and supper, & 2 tab at bedtime Day 2: 1 tab before breakfast, 1 tab after lunch and supper & 2 tabs at bedtime Day 3: 1 tab before breakfast and 1 tab after lunch, supper & at bedtime Day 4: 1 tab before breakfast, after lunch and at bedtime Day 5: 1 tab before breakfast & at bedtime Day 6: 1 tab before breakfast      . Milnacipran (SAVELLA) 50 MG TABS Take 50 mg by mouth 2 (two) times daily.        . tadalafil (CIALIS) 5 MG tablet Take 5 mg by mouth daily.        Marland Kitchen tiotropium (SPIRIVA) 18 MCG inhalation capsule Place 18 mcg into inhaler and inhale daily.             XBJ:YNWGN from the symptoms mentioned above,there are no other symptoms referable to all systems reviewed.  Physical Exam: Blood pressure 154/79, pulse 110, temperature 99 F (37.2 C), temperature source Oral, resp. rate 19, height 5\' 10"  (1.778 m), weight 73.8 kg (162 lb 11.2 oz), SpO2  97.00%. He is awake and alert. He looks dyspneic at rest. His pupils are reactive his  nose and throat are clear. Mucous hemorrhage are dry. His neck is supple without masses. His chest shows decreased breath sounds and prolonged expiration. His heart is regular without gallop. His abdomen is soft without masses. Extremities showed no edema. His central nervous system examination is grossly intact.    Basename 09/24/11 1140  WBC 15.2*  NEUTROABS 12.9*  HGB 13.9  HCT 44.6  MCV 96.5  PLT 354    Basename 09/24/11 1140  NA 139  K 4.9  CL 89*  CO2 >40*  GLUCOSE 135*  BUN 20  CREATININE 1.07  CALCIUM 9.9  MG --  lablast2(ast:2,ALT:2,alkphos:2,bilitot:2,prot:2,albumin:2)@    Recent Results (from the past 240 hour(s))  CULTURE, BLOOD (ROUTINE X 2)     Status: Normal   Collection Time   09/17/11 12:13 PM      Component Value Range Status Comment   Specimen Description BLOOD LEFT ANTECUBITAL DRAWN BY RN   Final    Special Requests BOTTLES DRAWN AEROBIC AND ANAEROBIC 4CC EACH   Final    Culture NO GROWTH 5 DAYS   Final    Report Status 09/22/2011 FINAL   Final   CULTURE, BLOOD (ROUTINE X 2)     Status: Normal   Collection Time   09/17/11 12:26 PM      Component Value Range Status Comment   Specimen Description BLOOD RIGHT ANTECUBITAL   Final    Special Requests BOTTLES DRAWN AEROBIC AND ANAEROBIC 4CC   Final    Culture NO GROWTH 5 DAYS   Final    Report Status 09/22/2011 FINAL   Final   MRSA PCR SCREENING     Status: Normal   Collection Time   09/17/11  3:39 PM      Component Value Range Status Comment   MRSA by PCR NEGATIVE  NEGATIVE  Final      Dg Chest 2 View  09/24/2011  *RADIOLOGY REPORT*  Clinical Data: COPD.  Short of breath.  CHEST - 2 VIEW  Comparison: 09/17/2011  Findings: Heart size remains normal.  Mediastinal shadows remain unremarkable.  Again seen is pulmonary hyperinflation consist with emphysema with crowding of markings in the perihilar regions and lower lobes.   There is no evidence of superimposed acute pneumonia. No collapse or effusion.  No acute bony finding.  Surgical clips in the lower right neck remain evident.  IMPRESSION: Emphysema and pulmonary scarring.  No sign of active consolidation or collapse.  Original Report Authenticated By: Thomasenia Sales, M.D.   Dg Pneumonia Chest Port1v  09/17/2011  *RADIOLOGY REPORT*  Clinical Data: Productive cough and shortness of breath.  Fever.  PORTABLE CHEST - 1 VIEW  Comparison: 12/19/2009.  Findings: Trachea is midline.  Heart size normal.  Emphysema.  No definite airspace consolidation or pleural fluid.  IMPRESSION: Emphysema without acute finding.  Original Report Authenticated By: Reyes Ivan, M.D.   Impression: He has COPD exacerbation. He initially improved then went home and has more trouble. I will treat him as if he has a healthcare associated pneumonia although on chest x-ray he does not show a definite pneumonia. I want to make sure that we don't have a problem with something like congestive heart failure. I will plan to continue his steroids etc. Active Problems:  * No active hospital problems. *      Plan: As above      Aengus Sauceda L Pager [phone]  09/24/2011, 6:18 PM

## 2011-09-24 NOTE — ED Notes (Signed)
Patient was placed in a gown and given a call bell was told if anything is needed to just let us know.

## 2011-09-24 NOTE — ED Notes (Signed)
CRITICAL VALUE ALERT  Critical value received:  CO2 49 (venous)  Date of notification:  09/24/2011  Time of notification: 1240  Critical value read back:yes  Nurse who received alert:  Garrison Columbus, RN  MD notified (1st page):  Al Decant, PA  Time of first page:  1240  MD notified (2nd page):  Time of second page:  Responding MD:  Hyacinth Meeker, PA  Time MD responded:  1240   ABG ordered.

## 2011-09-24 NOTE — ED Notes (Signed)
PA is at bedside.  NAD at this time.

## 2011-09-24 NOTE — ED Notes (Signed)
ABG  Currently being obtained on 2L.

## 2011-09-24 NOTE — Progress Notes (Signed)
No bipap needed at this time pt states he don't wear a bipap/cpap at home only concentrator o2. Pt in no distress and resting comfortably

## 2011-09-24 NOTE — ED Notes (Signed)
Pt and family given something to drink. Delay explained to pt and wife

## 2011-09-24 NOTE — ED Notes (Signed)
Beeped Dr. Juanetta Gosling to [phone].

## 2011-09-24 NOTE — ED Notes (Signed)
Pt c/o increased SOB. Order received for breathing tx (duoneb)

## 2011-09-24 NOTE — ED Provider Notes (Signed)
History     CSN: [id number]  Arrival date & time 09/24/11  4098   First MD Initiated Contact with Patient 09/24/11 1030      Chief Complaint  Patient presents with  . Shortness of Breath    (Consider location/radiation/quality/duration/timing/severity/associated sxs/prior treatment) HPI Comments: Pt was an inpatient for several days for tx of COPD.  He was discharged home 3 days ago and returned today because of SOB.  Patient is a 61 y.o. male presenting with shortness of breath. The history is provided by the patient and the spouse.  Shortness of Breath  The current episode started today. The problem has been gradually worsening. The problem is severe. The symptoms are relieved by nothing. Associated symptoms include shortness of breath. Pertinent negatives include no fever.    Past Medical History  Diagnosis Date  . COPD (chronic obstructive pulmonary disease)   . Hypertension   . Fibromyalgia     Past Surgical History  Procedure Date  . Hernia repair   . Thyroid surgery   . Parathyroid surgery   . Kidney surgery     History reviewed. No pertinent family history.  History  Substance Use Topics  . Smoking status: Current Everyday Smoker -- 0.5 packs/day  . Smokeless tobacco: Not on file  . Alcohol Use: No      Review of Systems  Constitutional: Negative for fever.  Respiratory: Positive for shortness of breath.   All other systems reviewed and are negative.    Allergies  Review of patient's allergies indicates no known allergies.  Home Medications  No current outpatient prescriptions on file.  BP 134/85  Pulse 107  Temp(Src) 98.6 F (37 C) (Oral)  Resp 18  Ht 5\' 10"  (1.778 m)  Wt 165 lb (74.844 kg)  BMI 23.68 kg/m2  SpO2 97%  Physical Exam  Nursing note and vitals reviewed. Constitutional: He is oriented to person, place, and time. He appears well-developed and well-nourished.  HENT:  Head: Normocephalic and atraumatic.  Eyes: EOM are  normal.  Neck: Normal range of motion.  Cardiovascular: Regular rhythm, S1 normal, S2 normal, normal heart sounds and intact distal pulses.   No extrasystoles are present. Tachycardia present.   Pulmonary/Chest: Accessory muscle usage present. Not tachypneic. He is in respiratory distress. He has no decreased breath sounds. He has wheezes in the right upper field, the right middle field, the right lower field, the left upper field, the left middle field and the left lower field. He has rhonchi in the right upper field, the right middle field, the left upper field, the left middle field and the left lower field. He has no rales.  Abdominal: Soft. He exhibits no distension. There is no tenderness.  Musculoskeletal: Normal range of motion.  Neurological: He is alert and oriented to person, place, and time.  Skin: Skin is warm and dry.  Psychiatric: He has a normal mood and affect. Judgment normal.    ED Course  Procedures (including critical care time)  Labs Reviewed  CBC - Abnormal; Notable for the following:    WBC 15.2 (*)    All other components within normal limits  DIFFERENTIAL - Abnormal; Notable for the following:    Neutrophils Relative 85 (*)    Lymphocytes Relative 6 (*)    Neutro Abs 12.9 (*)    Monocytes Absolute 1.2 (*)    All other components within normal limits  BASIC METABOLIC PANEL - Abnormal; Notable for the following:    Chloride 89 (*)  CO2 >40 (*)    Glucose, Bld 135 (*)    GFR calc non Af Amer 74 (*)    GFR calc Af Amer 85 (*)    All other components within normal limits  BLOOD GAS, ARTERIAL - Abnormal; Notable for the following:    pH, Arterial 7.346 (*)    pCO2 arterial 85.9 (*)    pO2, Arterial 64.9 (*)    Bicarbonate 45.8 (*)    Acid-Base Excess 19.1 (*)    All other components within normal limits   Dg Chest 2 View  09/24/2011  *RADIOLOGY REPORT*  Clinical Data: COPD.  Short of breath.  CHEST - 2 VIEW  Comparison: 09/17/2011  Findings: Heart size  remains normal.  Mediastinal shadows remain unremarkable.  Again seen is pulmonary hyperinflation consist with emphysema with crowding of markings in the perihilar regions and lower lobes.  There is no evidence of superimposed acute pneumonia. No collapse or effusion.  No acute bony finding.  Surgical clips in the lower right neck remain evident.  IMPRESSION: Emphysema and pulmonary scarring.  No sign of active consolidation or collapse.  Original Report Authenticated By: Thomasenia Sales, M.D.     1. COPD exacerbation       MDM  Discussed case with dr. Juanetta Gosling.  Will see/ admit.        Worthy Rancher, PA 09/24/11 [phone]

## 2011-09-24 NOTE — ED Notes (Signed)
RT aware of tx 

## 2011-09-24 NOTE — ED Notes (Signed)
Pt d/c from hospital on Friday for pneumonia. Pt states she has been taking antibiotic and prednisone. EMS stated reported RA O2 sat of 87% by fire dept. Prior to their arrival. EMS also administered an albuterol neb tx enroute. Pt reports continued productive cough, yellow in color. Mild expiratory wheezing auscultated after breathing tx, at this time. O2 sat of 95 % on 2L at present time.

## 2011-09-24 NOTE — ED Notes (Signed)
Pt discharged from hospital Friday. States he was given prednisone that is making him swell. More Sob today

## 2011-09-24 NOTE — Progress Notes (Signed)
ANTIBIOTIC CONSULT NOTE - INITIAL  Pharmacy Consult for Vancomycin/ Zosyn Indication: rule out pneumonia  No Known Allergies  Patient Measurements: Height: 5\' 10"  (177.8 cm) Weight: 165 lb (74.844 kg) IBW/kg (Calculated) : 73  Adjusted Body Weight:n/a  Vital Signs: Temp: 98.6 F (37 C) (01/14 0958) Temp src: Oral (01/14 0958) BP: 134/85 mmHg (01/14 1326) Pulse Rate: 107  (01/14 1326) Intake/Output from previous day:   Intake/Output from this shift:    Labs:  Basename 09/24/11 1140  WBC 15.2*  HGB 13.9  PLT 354  LABCREA --  CREATININE 1.07   Estimated Creatinine Clearance: 75.8 ml/min (by C-G formula based on Cr of 1.07).    Microbiology: Recent Results (from the past 720 hour(s))  CULTURE, BLOOD (ROUTINE X 2)     Status: Normal   Collection Time   09/17/11 12:13 PM      Component Value Range Status Comment   Specimen Description BLOOD LEFT ANTECUBITAL DRAWN BY RN   Final    Special Requests BOTTLES DRAWN AEROBIC AND ANAEROBIC 4CC EACH   Final    Culture NO GROWTH 5 DAYS   Final    Report Status 09/22/2011 FINAL   Final   CULTURE, BLOOD (ROUTINE X 2)     Status: Normal   Collection Time   09/17/11 12:26 PM      Component Value Range Status Comment   Specimen Description BLOOD RIGHT ANTECUBITAL   Final    Special Requests BOTTLES DRAWN AEROBIC AND ANAEROBIC 4CC   Final    Culture NO GROWTH 5 DAYS   Final    Report Status 09/22/2011 FINAL   Final   MRSA PCR SCREENING     Status: Normal   Collection Time   09/17/11  3:39 PM      Component Value Range Status Comment   MRSA by PCR NEGATIVE  NEGATIVE  Final     Medical History: Past Medical History  Diagnosis Date  . COPD (chronic obstructive pulmonary disease)   . Hypertension   . Fibromyalgia     Medications:  Scheduled:    . sodium chloride   Intravenous Once  . sodium chloride   Intravenous STAT  . albuterol  2.5 mg Nebulization Q6H  . albuterol  5 mg Nebulization Once  . aspirin  81 mg Oral Daily    . enoxaparin  40 mg Subcutaneous Daily  . ipratropium  0.5 mg Nebulization Once  . ipratropium  0.5 mg Nebulization Q6H  . methylPREDNISolone (SOLU-MEDROL) injection  125 mg Intravenous Once  . pantoprazole (PROTONIX) IV  40 mg Intravenous Q24H  . piperacillin-tazobactam (ZOSYN)  IV  3.375 g Intravenous Q8H  . tretinoin  10 mg Oral Daily  . vancomycin  1,000 mg Intravenous Q12H   Assessment: Empiric therapy  Goal of Therapy:  Vancomycin trough level 15-20 mcg/ml  Plan:  Vancomycin 1000 mg IV every 12 hours. Zosyn 3.375 grams IV every 8 hours. Vancomycin trough at steady state (09-26-11 at 4am)  Caryl Asp 09/24/2011,4:16 PM

## 2011-09-24 NOTE — ED Notes (Signed)
Connor Meeker, PA given critical results of ABG telephoned to [facility], RN by RT.

## 2011-09-25 LAB — CBC
HCT: 45.6 % (ref 39.0–52.0)
MCV: 97.2 fL (ref 78.0–100.0)
Platelets: 378 10*3/uL (ref 150–400)
RBC: 4.69 MIL/uL (ref 4.22–5.81)
WBC: 14.1 10*3/uL — ABNORMAL HIGH (ref 4.0–10.5)

## 2011-09-25 LAB — BASIC METABOLIC PANEL
CO2: >45 mEq/L (ref 19–32)
Chloride: 88 mEq/L — ABNORMAL LOW (ref 96–112)
Sodium: 140 mEq/L (ref 135–145)

## 2011-09-25 NOTE — Progress Notes (Signed)
Pt to be moved to room 324 per MD order. Report called to RN. Pt transferred via wheelchair to room with personal belongings.

## 2011-09-25 NOTE — Progress Notes (Signed)
CARE MANAGEMENT NOTE 09/25/2011  Patient:  Connor Armstrong, Connor Armstrong   Account Number:  [id number]  Date Initiated:  09/25/2011  Documentation initiated by:  Rosemary Holms  Subjective/Objective Assessment:   Pt admitted for COPD exacerbation. PTA lived at home with wife. States he has not previously qualified for [employer] but has had a [employer] that he has not yet used.     Action/Plan:   Pt to be dc'd to home. CM to continue to follow   Anticipated DC Date:  09/28/2011   Anticipated DC Plan:  HOME W HOME HEALTH SERVICES  In-house referral  Financial Counselor      DC Planning Services  CM consult      Choice offered to / List presented to:             Status of service:  In process, will continue to follow Medicare Important Message given?   (If response is "NO", the following Medicare IM given date fields will be blank) Date Medicare IM given:   Date Additional Medicare IM given:    Discharge Disposition:    Per UR Regulation:    Comments:  09/25/11 1500 Mariama Saintvil Leanord Hawking RN BSN CM

## 2011-09-25 NOTE — Progress Notes (Signed)
Subjective: He is overall much better. He has no new complaints. He says he's not coughing very much. He is fairly comfortable.  Objective: Vital signs in last 24 hours: Temp:  [98 F (36.7 C)-99 F (37.2 C)] 98.1 F (36.7 C) (01/15 0400) Pulse Rate:  [89-116] 95  (01/15 0600) Resp:  [18-30] 27  (01/15 0600) BP: (128-154)/(72-101) 133/83 mmHg (01/15 0600) SpO2:  [89 %-100 %] 97 % (01/15 0711) Weight:  [72.9 kg (160 lb 11.5 oz)-74.844 kg (165 lb)] 72.9 kg (160 lb 11.5 oz) (01/15 0500) Weight change:  Last BM Date: 09/23/11  Intake/Output from previous day: 01/14 0701 - 01/15 0700 In: 2190 [P.O.:560; I.V.:1080; IV Piggyback:550] Out: 3350 [Urine:3350]  PHYSICAL EXAM General appearance: alert, cooperative and mild distress Resp: clear to auscultation bilaterally Cardio: regular rate and rhythm, S1, S2 normal, no murmur, click, rub or gallop GI: soft, non-tender; bowel sounds normal; no masses,  no organomegaly Extremities: extremities normal, atraumatic, no cyanosis or edema  Lab Results:    Basic Metabolic Panel:  Basename 09/25/11 0512 09/24/11 1140  NA 140 139  K 4.8 4.9  CL 88* 89*  CO2 >45* >40*  GLUCOSE 103* 135*  BUN 16 20  CREATININE 1.12 1.07  CALCIUM 9.8 9.9  MG -- --  PHOS -- --   Liver Function Tests: No results found for this basename: AST:2,ALT:2,ALKPHOS:2,BILITOT:2,PROT:2,ALBUMIN:2 in the last 72 hours No results found for this basename: LIPASE:2,AMYLASE:2 in the last 72 hours No results found for this basename: AMMONIA:2 in the last 72 hours CBC:  Basename 09/25/11 0512 09/24/11 1140  WBC 14.1* 15.2*  NEUTROABS -- 12.9*  HGB 14.3 13.9  HCT 45.6 44.6  MCV 97.2 96.5  PLT 378 354   Cardiac Enzymes: No results found for this basename: CKTOTAL:3,CKMB:3,CKMBINDEX:3,TROPONINI:3 in the last 72 hours BNP:  Basename 09/24/11 1140  PROBNP 289.8*   D-Dimer: No results found for this basename: DDIMER:2 in the last 72 hours CBG: No results found  for this basename: GLUCAP:6 in the last 72 hours Hemoglobin A1C: No results found for this basename: HGBA1C in the last 72 hours Fasting Lipid Panel: No results found for this basename: CHOL,HDL,LDLCALC,TRIG,CHOLHDL,LDLDIRECT in the last 72 hours Thyroid Function Tests: No results found for this basename: TSH,T4TOTAL,FREET4,T3FREE,THYROIDAB in the last 72 hours Anemia Panel: No results found for this basename: VITAMINB12,FOLATE,FERRITIN,TIBC,IRON,RETICCTPCT in the last 72 hours Coagulation: No results found for this basename: LABPROT:2,INR:2 in the last 72 hours Urine Drug Screen: Drugs of Abuse  No results found for this basename: labopia, cocainscrnur, labbenz, amphetmu, thcu, labbarb    Alcohol Level: No results found for this basename: ETH:2 in the last 72 hours Urinalysis:  Misc. Labs:  ABGS  Basename 09/24/11 1247  PHART 7.346*  PO2ART 64.9*  TCO2 40.6  HCO3 45.8*   CULTURES Recent Results (from the past 240 hour(s))  CULTURE, BLOOD (ROUTINE X 2)     Status: Normal   Collection Time   09/17/11 12:13 PM      Component Value Range Status Comment   Specimen Description BLOOD LEFT ANTECUBITAL DRAWN BY RN   Final    Special Requests BOTTLES DRAWN AEROBIC AND ANAEROBIC 4CC EACH   Final    Culture NO GROWTH 5 DAYS   Final    Report Status 09/22/2011 FINAL   Final   CULTURE, BLOOD (ROUTINE X 2)     Status: Normal   Collection Time   09/17/11 12:26 PM      Component Value Range Status Comment  Specimen Description BLOOD RIGHT ANTECUBITAL   Final    Special Requests BOTTLES DRAWN AEROBIC AND ANAEROBIC 4CC   Final    Culture NO GROWTH 5 DAYS   Final    Report Status 09/22/2011 FINAL   Final   MRSA PCR SCREENING     Status: Normal   Collection Time   09/17/11  3:39 PM      Component Value Range Status Comment   MRSA by PCR NEGATIVE  NEGATIVE  Final    Studies/Results: Dg Chest 2 View  09/24/2011  *RADIOLOGY REPORT*  Clinical Data: COPD.  Short of breath.  CHEST - 2 VIEW   Comparison: 09/17/2011  Findings: Heart size remains normal.  Mediastinal shadows remain unremarkable.  Again seen is pulmonary hyperinflation consist with emphysema with crowding of markings in the perihilar regions and lower lobes.  There is no evidence of superimposed acute pneumonia. No collapse or effusion.  No acute bony finding.  Surgical clips in the lower right neck remain evident.  IMPRESSION: Emphysema and pulmonary scarring.  No sign of active consolidation or collapse.  Original Report Authenticated By: Thomasenia Sales, M.D.    Medications:  Prior to Admission:  Prescriptions prior to admission  Medication Sig Dispense Refill  . acetaminophen (TYLENOL) 500 MG tablet Take 1,000 mg by mouth every 6 (six) hours as needed. For pain       . albuterol (PROVENTIL HFA;VENTOLIN HFA) 108 (90 BASE) MCG/ACT inhaler Inhale 2 puffs into the lungs every 6 (six) hours as needed. For shortness of breath       . aspirin 81 MG chewable tablet Chew 81 mg by mouth daily.      . cefUROXime (CEFTIN) 500 MG tablet Take 1 tablet (500 mg total) by mouth 2 (two) times daily.  20 tablet  0  . cyclobenzaprine (FLEXERIL) 10 MG tablet Take 10 mg by mouth 2 (two) times daily.        Marland Kitchen Dextromethorphan-Guaifenesin (GUAIFENESIN DM) 400-20 MG TABS Take 1 tablet by mouth 2 (two) times daily.      . Fluticasone-Salmeterol (ADVAIR) 250-50 MCG/DOSE AEPB Inhale 1 puff into the lungs every 12 (twelve) hours.        Marland Kitchen lisinopril-hydrochlorothiazide (PRINZIDE,ZESTORETIC) 20-12.5 MG per tablet Take 0.5 tablets by mouth daily.        . methylPREDNISolone (MEDROL DOSEPAK) 4 MG tablet Take by mouth as directed. Day 1: 2 tabs before breakfast, 1 tab after lunch, and supper, & 2 tab at bedtime Day 2: 1 tab before breakfast, 1 tab after lunch and supper & 2 tabs at bedtime Day 3: 1 tab before breakfast and 1 tab after lunch, supper & at bedtime Day 4: 1 tab before breakfast, after lunch and at bedtime Day 5: 1 tab before breakfast &  at bedtime Day 6: 1 tab before breakfast      . Milnacipran (SAVELLA) 50 MG TABS Take 50 mg by mouth 2 (two) times daily.        . tadalafil (CIALIS) 5 MG tablet Take 5 mg by mouth daily.        Marland Kitchen tiotropium (SPIRIVA) 18 MCG inhalation capsule Place 18 mcg into inhaler and inhale daily.        Marland Kitchen tretinoin (VESANOID) 10 MG capsule Take 10 mg by mouth daily.       Scheduled:   . sodium chloride   Intravenous Once  . sodium chloride   Intravenous STAT  . albuterol  2.5 mg Nebulization Q4H WA  .  albuterol  5 mg Nebulization Once  . aspirin  81 mg Oral Daily  . cyclobenzaprine  10 mg Oral BID  . enoxaparin  40 mg Subcutaneous Daily  . ipratropium  0.5 mg Nebulization Once  . ipratropium  0.5 mg Nebulization Q4H WA  . methylPREDNISolone (SOLU-MEDROL) injection  125 mg Intravenous Once  . Milnacipran  50 mg Oral BID  . pantoprazole (PROTONIX) IV  40 mg Intravenous Q24H  . piperacillin-tazobactam (ZOSYN)  IV  3.375 g Intravenous Q8H  . vancomycin  1,000 mg Intravenous Q12H  . DISCONTD: albuterol  2.5 mg Nebulization Q6H  . DISCONTD: ipratropium  0.5 mg Nebulization Q6H  . DISCONTD: tretinoin  10 mg Oral Daily  . DISCONTD: tretinoin  10 mg Oral Q breakfast   Continuous:   . sodium chloride 75 mL/hr at 09/25/11 0600   ZOX:[id number], acetaminophen, albuterol, albuterol, HYDROcodone-acetaminophen, ondansetron (ZOFRAN) IV, ondansetron  Assesment: He has COPD exacerbation. He has acute on chronic respiratory failure. He had an episode of bronchitis. He seems to be getting better. Active Problems:  * No active hospital problems. *     Plan: I will plan to transfer him out of the step down unit to a telemetry bed today. He will continue his other treatments.    LOS: 1 day   Sadonna Kotara L 09/25/2011, 8:00 AM

## 2011-09-25 NOTE — Plan of Care (Signed)
Problem: Consults Goal: Respiratory Problems Patient Education See Patient Education Module for education specifics.  Outcome: Progressing Copd exacerbation, f/u for recent pneumonia  Problem: ICU Phase Progression Outcomes Goal: O2 sats trending toward baseline Outcome: Progressing Patient on oxygen at home, 3Liters nasal cannula presently, tolerating well. Compensated co2 retainer Goal: Dyspnea controlled at rest Outcome: Progressing SOB when assisted to [facility] Goal: Pain controlled with appropriate interventions Outcome: Not Applicable Date Met:  09/25/11 No c/o pain Goal: Flu/PneumoVaccines if indicated Outcome: Completed/Met Date Met:  09/25/11 Both flu and pneumonia vaccines up to date Goal: Initial discharge plan identified Outcome: Progressing To Home

## 2011-09-26 LAB — VANCOMYCIN, TROUGH: Vancomycin Tr: 14.7 ug/mL (ref 10.0–20.0)

## 2011-09-26 MED ORDER — LISINOPRIL-HYDROCHLOROTHIAZIDE 20-12.5 MG PO TABS: 0.5000 | ORAL_TABLET | Freq: Every day | ORAL | Status: DC

## 2011-09-26 MED ORDER — GUAIFENESIN ER 600 MG PO TB12
1200.0000 mg | ORAL_TABLET | Freq: Two times a day (BID) | ORAL | Status: DC
  Administered 2011-09-26 – 2011-09-27 (×3): 1200 mg via ORAL
  Filled 2011-09-26 (×3): qty 2

## 2011-09-26 MED ORDER — TADALAFIL 5 MG PO TABS
5.0000 mg | ORAL_TABLET | Freq: Every day | ORAL | Status: DC
  Filled 2011-09-26 (×3): qty 1

## 2011-09-26 MED ORDER — METHYLPREDNISOLONE SODIUM SUCC 125 MG IJ SOLR
125.0000 mg | Freq: Four times a day (QID) | INTRAMUSCULAR | Status: DC
  Administered 2011-09-26 (×2): 125 mg via INTRAVENOUS
  Filled 2011-09-26 (×2): qty 2

## 2011-09-26 MED ORDER — HYDROCHLOROTHIAZIDE 10 MG/ML ORAL SUSPENSION
6.2500 mg | Freq: Every day | ORAL | Status: DC
  Administered 2011-09-26 – 2011-09-27 (×2): 6.25 mg via ORAL
  Filled 2011-09-26 (×4): qty 1.25

## 2011-09-26 MED ORDER — TIOTROPIUM BROMIDE MONOHYDRATE 18 MCG IN CAPS
18.0000 ug | ORAL_CAPSULE | Freq: Every day | RESPIRATORY_TRACT | Status: DC
  Administered 2011-09-26 – 2011-09-27 (×2): 18 ug via RESPIRATORY_TRACT
  Filled 2011-09-26: qty 5

## 2011-09-26 MED ORDER — LISINOPRIL 10 MG PO TABS
10.0000 mg | ORAL_TABLET | Freq: Every day | ORAL | Status: DC
  Administered 2011-09-26 – 2011-09-27 (×2): 10 mg via ORAL
  Filled 2011-09-26 (×2): qty 1

## 2011-09-26 MED ORDER — MILNACIPRAN HCL 50 MG PO TABS
50.0000 mg | ORAL_TABLET | Freq: Two times a day (BID) | ORAL | Status: DC
  Administered 2011-09-26 – 2011-09-27 (×3): 50 mg via ORAL
  Filled 2011-09-26 (×5): qty 1

## 2011-09-26 MED ORDER — AMOXICILLIN-POT CLAVULANATE 875-125 MG PO TABS
1.0000 | ORAL_TABLET | Freq: Two times a day (BID) | ORAL | Status: DC
  Administered 2011-09-26 – 2011-09-27 (×2): 1 via ORAL
  Filled 2011-09-26 (×2): qty 1

## 2011-09-26 MED ORDER — PANTOPRAZOLE SODIUM 40 MG PO TBEC
40.0000 mg | DELAYED_RELEASE_TABLET | Freq: Every day | ORAL | Status: DC
  Administered 2011-09-26 – 2011-09-27 (×2): 40 mg via ORAL
  Filled 2011-09-26 (×2): qty 1

## 2011-09-26 MED ORDER — PREDNISONE 20 MG PO TABS
50.0000 mg | ORAL_TABLET | Freq: Every day | ORAL | Status: DC
  Administered 2011-09-27: 50 mg via ORAL
  Filled 2011-09-26: qty 2

## 2011-09-26 MED ORDER — FLUTICASONE-SALMETEROL 250-50 MCG/DOSE IN AEPB
1.0000 | INHALATION_SPRAY | Freq: Two times a day (BID) | RESPIRATORY_TRACT | Status: DC
  Administered 2011-09-26: 1 via RESPIRATORY_TRACT

## 2011-09-26 MED ORDER — FLUTICASONE-SALMETEROL 250-50 MCG/DOSE IN AEPB
1.0000 | INHALATION_SPRAY | Freq: Two times a day (BID) | RESPIRATORY_TRACT | Status: DC
  Administered 2011-09-26 – 2011-09-27 (×3): 1 via RESPIRATORY_TRACT
  Filled 2011-09-26: qty 14

## 2011-09-26 NOTE — Progress Notes (Addendum)
At 2335, RT notified me that pt was complaining of chest pain and some pain near his shoulder or back. RT gave treatment at 2330. I assessed the patient at 0015, he said he felt like he may have pulled something while coughing so much. Pts vital signs were stable and was NRS on telemetry. I notified Dr. Janna Arch of chest pain at 0050, he said it sounded just like respiratory and to continue to let the patient rest. Upon reassessment, pt said that the breathing tx helped the pain in the chest. Sheryn Bison

## 2011-09-26 NOTE — Progress Notes (Signed)
Subjective: He is admitted with COPD exacerbation. He had been hospitalized and then went home and came back several days later with increasing shortness of breath. He is being treated as if he has a healthcare associated pneumonia but he does not have definite pneumonia on chest x-ray. However he has such poor lung tissue that he could have a pneumonia we cannot see.  Objective: Vital signs in last 24 hours: Temp:  [97.4 F (36.3 C)-98.6 F (37 C)] 98 F (36.7 C) (01/16 0517) Pulse Rate:  [83-101] 96  (01/16 0517) Resp:  [18-23] 20  (01/16 0517) BP: (103-136)/(68-79) 126/79 mmHg (01/16 0517) SpO2:  [96 %-100 %] 96 % (01/16 0517) FiO2 (%):  [97 %] 97 % (01/16 0655) Weight:  [73.664 kg (162 lb 6.4 oz)] 73.664 kg (162 lb 6.4 oz) (01/16 0517) Weight change: -1.179 kg (-2 lb 9.6 oz) Last BM Date: 09/23/11  Intake/Output from previous day: 01/15 0701 - 01/16 0700 In: 610 [P.O.:460; I.V.:150] Out: 1600 [Urine:1600]  PHYSICAL EXAM General appearance: alert, cooperative and mild distress Resp: rhonchi bilaterally Cardio: regular rate and rhythm, S1, S2 normal, no murmur, click, rub or gallop GI: soft, non-tender; bowel sounds normal; no masses,  no organomegaly Extremities: extremities normal, atraumatic, no cyanosis or edema  Lab Results:    Basic Metabolic Panel:  Basename 09/25/11 0512 09/24/11 1140  NA 140 139  K 4.8 4.9  CL 88* 89*  CO2 >45* >40*  GLUCOSE 103* 135*  BUN 16 20  CREATININE 1.12 1.07  CALCIUM 9.8 9.9  MG -- --  PHOS -- --   Liver Function Tests: No results found for this basename: AST:2,ALT:2,ALKPHOS:2,BILITOT:2,PROT:2,ALBUMIN:2 in the last 72 hours No results found for this basename: LIPASE:2,AMYLASE:2 in the last 72 hours No results found for this basename: AMMONIA:2 in the last 72 hours CBC:  Basename 09/25/11 0512 09/24/11 1140  WBC 14.1* 15.2*  NEUTROABS -- 12.9*  HGB 14.3 13.9  HCT 45.6 44.6  MCV 97.2 96.5  PLT 378 354   Cardiac  Enzymes: No results found for this basename: CKTOTAL:3,CKMB:3,CKMBINDEX:3,TROPONINI:3 in the last 72 hours BNP:  Basename 09/24/11 1140  PROBNP 289.8*   D-Dimer: No results found for this basename: DDIMER:2 in the last 72 hours CBG: No results found for this basename: GLUCAP:6 in the last 72 hours Hemoglobin A1C: No results found for this basename: HGBA1C in the last 72 hours Fasting Lipid Panel: No results found for this basename: CHOL,HDL,LDLCALC,TRIG,CHOLHDL,LDLDIRECT in the last 72 hours Thyroid Function Tests: No results found for this basename: TSH,T4TOTAL,FREET4,T3FREE,THYROIDAB in the last 72 hours Anemia Panel: No results found for this basename: VITAMINB12,FOLATE,FERRITIN,TIBC,IRON,RETICCTPCT in the last 72 hours Coagulation: No results found for this basename: LABPROT:2,INR:2 in the last 72 hours Urine Drug Screen: Drugs of Abuse  No results found for this basename: labopia, cocainscrnur, labbenz, amphetmu, thcu, labbarb    Alcohol Level: No results found for this basename: ETH:2 in the last 72 hours Urinalysis:  Misc. Labs:  ABGS  Basename 09/24/11 1247  PHART 7.346*  PO2ART 64.9*  TCO2 40.6  HCO3 45.8*   CULTURES Recent Results (from the past 240 hour(s))  CULTURE, BLOOD (ROUTINE X 2)     Status: Normal   Collection Time   09/17/11 12:13 PM      Component Value Range Status Comment   Specimen Description BLOOD LEFT ANTECUBITAL DRAWN BY RN   Final    Special Requests BOTTLES DRAWN AEROBIC AND ANAEROBIC 4CC EACH   Final    Culture NO  GROWTH 5 DAYS   Final    Report Status 09/22/2011 FINAL   Final   CULTURE, BLOOD (ROUTINE X 2)     Status: Normal   Collection Time   09/17/11 12:26 PM      Component Value Range Status Comment   Specimen Description BLOOD RIGHT ANTECUBITAL   Final    Special Requests BOTTLES DRAWN AEROBIC AND ANAEROBIC 4CC   Final    Culture NO GROWTH 5 DAYS   Final    Report Status 09/22/2011 FINAL   Final   MRSA PCR SCREENING     Status:  Normal   Collection Time   09/17/11  3:39 PM      Component Value Range Status Comment   MRSA by PCR NEGATIVE  NEGATIVE  Final    Studies/Results: Dg Chest 2 View  09/24/2011  *RADIOLOGY REPORT*  Clinical Data: COPD.  Short of breath.  CHEST - 2 VIEW  Comparison: 09/17/2011  Findings: Heart size remains normal.  Mediastinal shadows remain unremarkable.  Again seen is pulmonary hyperinflation consist with emphysema with crowding of markings in the perihilar regions and lower lobes.  There is no evidence of superimposed acute pneumonia. No collapse or effusion.  No acute bony finding.  Surgical clips in the lower right neck remain evident.  IMPRESSION: Emphysema and pulmonary scarring.  No sign of active consolidation or collapse.  Original Report Authenticated By: Thomasenia Sales, M.D.    Medications:  Prior to Admission:  Prescriptions prior to admission  Medication Sig Dispense Refill  . acetaminophen (TYLENOL) 500 MG tablet Take 1,000 mg by mouth every 6 (six) hours as needed. For pain       . albuterol (PROVENTIL HFA;VENTOLIN HFA) 108 (90 BASE) MCG/ACT inhaler Inhale 2 puffs into the lungs every 6 (six) hours as needed. For shortness of breath       . aspirin 81 MG chewable tablet Chew 81 mg by mouth daily.      . cefUROXime (CEFTIN) 500 MG tablet Take 1 tablet (500 mg total) by mouth 2 (two) times daily.  20 tablet  0  . cyclobenzaprine (FLEXERIL) 10 MG tablet Take 10 mg by mouth 2 (two) times daily.        Marland Kitchen Dextromethorphan-Guaifenesin (GUAIFENESIN DM) 400-20 MG TABS Take 1 tablet by mouth 2 (two) times daily.      . Fluticasone-Salmeterol (ADVAIR) 250-50 MCG/DOSE AEPB Inhale 1 puff into the lungs every 12 (twelve) hours.        Marland Kitchen lisinopril-hydrochlorothiazide (PRINZIDE,ZESTORETIC) 20-12.5 MG per tablet Take 0.5 tablets by mouth daily.        . methylPREDNISolone (MEDROL DOSEPAK) 4 MG tablet Take by mouth as directed. Day 1: 2 tabs before breakfast, 1 tab after lunch, and supper, & 2 tab  at bedtime Day 2: 1 tab before breakfast, 1 tab after lunch and supper & 2 tabs at bedtime Day 3: 1 tab before breakfast and 1 tab after lunch, supper & at bedtime Day 4: 1 tab before breakfast, after lunch and at bedtime Day 5: 1 tab before breakfast & at bedtime Day 6: 1 tab before breakfast      . Milnacipran (SAVELLA) 50 MG TABS Take 50 mg by mouth 2 (two) times daily.        . tadalafil (CIALIS) 5 MG tablet Take 5 mg by mouth daily.        Marland Kitchen tiotropium (SPIRIVA) 18 MCG inhalation capsule Place 18 mcg into inhaler and inhale daily.        Marland Kitchen  tretinoin (VESANOID) 10 MG capsule Take 10 mg by mouth daily.       Scheduled:   . sodium chloride   Intravenous STAT  . albuterol  2.5 mg Nebulization Q4H WA  . aspirin  81 mg Oral Daily  . cyclobenzaprine  10 mg Oral BID  . enoxaparin  40 mg Subcutaneous Daily  . Fluticasone-Salmeterol  1 puff Inhalation BID  . guaiFENesin  1,200 mg Oral BID  . ipratropium  0.5 mg Nebulization Q4H WA  . methylPREDNISolone (SOLU-MEDROL) injection  125 mg Intravenous Q6H  . Milnacipran  50 mg Oral BID  . pantoprazole (PROTONIX) IV  40 mg Intravenous Q24H  . piperacillin-tazobactam (ZOSYN)  IV  3.375 g Intravenous Q8H  . vancomycin  1,000 mg Intravenous Q12H   Continuous:   . sodium chloride 75 mL/hr at 09/25/11 0900   WUJ:[id number], acetaminophen, albuterol, HYDROcodone-acetaminophen, ondansetron (ZOFRAN) IV, ondansetron  Assesment: He has COPD exacerbation. He may have pneumonia. He is overall about the same. He also has hypertension which is stable Active Problems:  * No active hospital problems. *     Plan: I would have him continue his current treatments  and start him  back on IV steroids since he has had antibiotics for greater than 24 hours. He also add Mucinex. He will continue a flutter valve.    LOS: 2 days   Zolton Dowson L 09/26/2011, 7:56 AM

## 2011-09-26 NOTE — Progress Notes (Signed)
The patient is receiving Protonix by the intravenous route.  Based on criteria approved by the Pharmacy and Therapeutics Committee and the Medical Executive Committee, the medication is being converted to the equivalent oral dose form.  These criteria include: -No Active GI bleeding -Able to tolerate diet of full liquids (or better) or tube feeding -Able to tolerate other medications by the oral or enteral route  If you have any questions about this conversion, please contact the Pharmacy Department (ext [phone]).  Thank you.  S. Yamileth Hayse, PharmD  

## 2011-09-26 NOTE — ED Provider Notes (Signed)
Medical screening examination/treatment/procedure(s) were performed by non-physician practitioner and as supervising physician I was immediately available for consultation/collaboration.  Nicoletta Dress. Colon Branch, MD 09/26/11 [phone]

## 2011-09-26 NOTE — Progress Notes (Signed)
ANTIBIOTIC CONSULT NOTE   Pharmacy Consult for Vancomycin/ Zosyn Indication: rule out pneumonia  No Known Allergies  Patient Measurements: Height: 5\' 10"  (177.8 cm) Weight: 162 lb 6.4 oz (73.664 kg) IBW/kg (Calculated) : 73  Adjusted Body Weight:n/a  Vital Signs: Temp: 98 F (36.7 C) (01/16 0517) Temp src: Oral (01/16 0041) BP: 126/79 mmHg (01/16 0517) Pulse Rate: 96  (01/16 0517) Intake/Output from previous day: 01/15 0701 - 01/16 0700 In: 610 [P.O.:460; I.V.:150] Out: 1600 [Urine:1600] Intake/Output from this shift:    Labs: RECENT VANCOMYCIN TROUGH LEVELS: Recent Labs  Basename 09/26/11 0400   VANCOTROUGH 14.7     Basename 09/25/11 0512 09/24/11 1140  WBC 14.1* 15.2*  HGB 14.3 13.9  PLT 378 354  LABCREA -- --  CREATININE 1.12 1.07   Estimated Creatinine Clearance: 72.4 ml/min (by C-G formula based on Cr of 1.12).   Microbiology: Recent Results (from the past 720 hour(s))  CULTURE, BLOOD (ROUTINE X 2)     Status: Normal   Collection Time   09/17/11 12:13 PM      Component Value Range Status Comment   Specimen Description BLOOD LEFT ANTECUBITAL DRAWN BY RN   Final    Special Requests BOTTLES DRAWN AEROBIC AND ANAEROBIC 4CC EACH   Final    Culture NO GROWTH 5 DAYS   Final    Report Status 09/22/2011 FINAL   Final   CULTURE, BLOOD (ROUTINE X 2)     Status: Normal   Collection Time   09/17/11 12:26 PM      Component Value Range Status Comment   Specimen Description BLOOD RIGHT ANTECUBITAL   Final    Special Requests BOTTLES DRAWN AEROBIC AND ANAEROBIC 4CC   Final    Culture NO GROWTH 5 DAYS   Final    Report Status 09/22/2011 FINAL   Final   MRSA PCR SCREENING     Status: Normal   Collection Time   09/17/11  3:39 PM      Component Value Range Status Comment   MRSA by PCR NEGATIVE  NEGATIVE  Final    Medical History: Past Medical History  Diagnosis Date  . COPD (chronic obstructive pulmonary disease)   . Hypertension   . Fibromyalgia    Medications:    Scheduled:     . sodium chloride   Intravenous STAT  . albuterol  2.5 mg Nebulization Q4H WA  . aspirin  81 mg Oral Daily  . cyclobenzaprine  10 mg Oral BID  . enoxaparin  40 mg Subcutaneous Daily  . Fluticasone-Salmeterol  1 puff Inhalation BID  . guaiFENesin  1,200 mg Oral BID  . hydrochlorothiazide  6.25 mg Oral Daily   And  . lisinopril  10 mg Oral Daily  . ipratropium  0.5 mg Nebulization Q4H WA  . methylPREDNISolone (SOLU-MEDROL) injection  125 mg Intravenous Q6H  . Milnacipran  50 mg Oral BID  . pantoprazole (PROTONIX) IV  40 mg Intravenous Q24H  . piperacillin-tazobactam (ZOSYN)  IV  3.375 g Intravenous Q8H  . tadalafil  5 mg Oral Daily  . tiotropium  18 mcg Inhalation Daily  . vancomycin  1,000 mg Intravenous Q12H  . DISCONTD: Fluticasone-Salmeterol  1 puff Inhalation Q12H  . DISCONTD: lisinopril-hydrochlorothiazide  0.5 tablet Oral Daily  . DISCONTD: Milnacipran  50 mg Oral BID   Assessment: Trough level on target (14.7)  Goal of Therapy:  Vancomycin trough level 15-20 mcg/ml  Plan:  Continue Vancomycin 1000 mg IV every 12 hours. Continue Zosyn  3.375 grams IV every 8 hours. Labs per protocol  Valrie Hart A 09/26/2011,8:47 AM

## 2011-09-27 MED ORDER — AMOXICILLIN-POT CLAVULANATE 875-125 MG PO TABS: 1.0000 | ORAL_TABLET | Freq: Two times a day (BID) | ORAL | Status: AC

## 2011-09-27 MED ORDER — PREDNISONE 10 MG PO TABS: ORAL_TABLET | ORAL | Status: DC

## 2011-09-27 NOTE — Progress Notes (Signed)
CARE MANAGEMENT NOTE 09/27/2011  Patient:  Connor Armstrong, Connor Armstrong   Account Number:  [id number]  Date Initiated:  09/25/2011  Documentation initiated by:  Rosemary Holms  Subjective/Objective Assessment:   Pt admitted for COPD exacerbation. PTA lived at home with wife. States he has not previously qualified for [employer] but has had a [employer] that he has not yet used.     Action/Plan:   Pt to be dc'd to home. CM to continue to follow   Anticipated DC Date:  09/28/2011   Anticipated DC Plan:  HOME W HOME HEALTH SERVICES  In-house referral  Financial Counselor      DC Planning Services  CM consult      Choice offered to / List presented to:             Status of service:  Completed, signed off Medicare Important Message given?   (If response is "NO", the following Medicare IM given date fields will be blank) Date Medicare IM given:   Date Additional Medicare IM given:    Discharge Disposition:    Per UR Regulation:    Comments:  09/27/11 900 Monzerat Handler RN  BSN CM CM set pt up with [facility].  09/25/11 1500 Raisha Brabender Leanord Hawking RN BSN CM

## 2011-09-27 NOTE — Progress Notes (Signed)
Subjective: He says he wants to go home. He is breathing better and did well on oral antibiotics and steroids. He feels like he is back at baseline. He is not coughing anything up. He has been moving around the room and is not short of breath anymore than usual.  Objective: Vital signs in last 24 hours: Temp:  [97.5 F (36.4 C)-98.5 F (36.9 C)] 97.5 F (36.4 C) (01/17 1610) Pulse Rate:  [62-100] 85  (01/17 0614) Resp:  [18] 18  (01/17 0614) BP: (107-127)/(61-76) 116/69 mmHg (01/17 0614) SpO2:  [95 %-98 %] 95 % (01/17 0729) FiO2 (%):  [90 %-100 %] 97 % (01/16 2232) Weight change:  Last BM Date: 09/24/11  Intake/Output from previous day: 01/16 0701 - 01/17 0700 In: 832.5 [I.V.:582.5; IV Piggyback:250] Out: 1950 [Urine:1950]  PHYSICAL EXAM General appearance: alert, cooperative and no distress  Lab Results:    Basic Metabolic Panel:  Basename 09/25/11 0512 09/24/11 1140  NA 140 139  K 4.8 4.9  CL 88* 89*  CO2 >45* >40*  GLUCOSE 103* 135*  BUN 16 20  CREATININE 1.12 1.07  CALCIUM 9.8 9.9  MG -- --  PHOS -- --   Liver Function Tests: No results found for this basename: AST:2,ALT:2,ALKPHOS:2,BILITOT:2,PROT:2,ALBUMIN:2 in the last 72 hours No results found for this basename: LIPASE:2,AMYLASE:2 in the last 72 hours No results found for this basename: AMMONIA:2 in the last 72 hours CBC:  Basename 09/25/11 0512 09/24/11 1140  WBC 14.1* 15.2*  NEUTROABS -- 12.9*  HGB 14.3 13.9  HCT 45.6 44.6  MCV 97.2 96.5  PLT 378 354   Cardiac Enzymes: No results found for this basename: CKTOTAL:3,CKMB:3,CKMBINDEX:3,TROPONINI:3 in the last 72 hours BNP:  Basename 09/24/11 1140  PROBNP 289.8*   D-Dimer: No results found for this basename: DDIMER:2 in the last 72 hours CBG: No results found for this basename: GLUCAP:6 in the last 72 hours Hemoglobin A1C: No results found for this basename: HGBA1C in the last 72 hours Fasting Lipid Panel: No results found for this basename:  CHOL,HDL,LDLCALC,TRIG,CHOLHDL,LDLDIRECT in the last 72 hours Thyroid Function Tests: No results found for this basename: TSH,T4TOTAL,FREET4,T3FREE,THYROIDAB in the last 72 hours Anemia Panel: No results found for this basename: VITAMINB12,FOLATE,FERRITIN,TIBC,IRON,RETICCTPCT in the last 72 hours Coagulation: No results found for this basename: LABPROT:2,INR:2 in the last 72 hours Urine Drug Screen: Drugs of Abuse  No results found for this basename: labopia, cocainscrnur, labbenz, amphetmu, thcu, labbarb    Alcohol Level: No results found for this basename: ETH:2 in the last 72 hours Urinalysis:  Misc. Labs:  ABGS  Basename 09/24/11 1247  PHART 7.346*  PO2ART 64.9*  TCO2 40.6  HCO3 45.8*   CULTURES Recent Results (from the past 240 hour(s))  CULTURE, BLOOD (ROUTINE X 2)     Status: Normal   Collection Time   09/17/11 12:13 PM      Component Value Range Status Comment   Specimen Description BLOOD LEFT ANTECUBITAL DRAWN BY RN   Final    Special Requests BOTTLES DRAWN AEROBIC AND ANAEROBIC 4CC EACH   Final    Culture NO GROWTH 5 DAYS   Final    Report Status 09/22/2011 FINAL   Final   CULTURE, BLOOD (ROUTINE X 2)     Status: Normal   Collection Time   09/17/11 12:26 PM      Component Value Range Status Comment   Specimen Description BLOOD RIGHT ANTECUBITAL   Final    Special Requests BOTTLES DRAWN AEROBIC AND ANAEROBIC 4CC  Final    Culture NO GROWTH 5 DAYS   Final    Report Status 09/22/2011 FINAL   Final   MRSA PCR SCREENING     Status: Normal   Collection Time   09/17/11  3:39 PM      Component Value Range Status Comment   MRSA by PCR NEGATIVE  NEGATIVE  Final    Studies/Results: No results found.  Medications:  Prior to Admission:  Prescriptions prior to admission  Medication Sig Dispense Refill  . acetaminophen (TYLENOL) 500 MG tablet Take 1,000 mg by mouth every 6 (six) hours as needed. For pain       . albuterol (PROVENTIL HFA;VENTOLIN HFA) 108 (90 BASE)  MCG/ACT inhaler Inhale 2 puffs into the lungs every 6 (six) hours as needed. For shortness of breath       . aspirin 81 MG chewable tablet Chew 81 mg by mouth daily.      . cefUROXime (CEFTIN) 500 MG tablet Take 1 tablet (500 mg total) by mouth 2 (two) times daily.  20 tablet  0  . cyclobenzaprine (FLEXERIL) 10 MG tablet Take 10 mg by mouth 2 (two) times daily.        Marland Kitchen Dextromethorphan-Guaifenesin (GUAIFENESIN DM) 400-20 MG TABS Take 1 tablet by mouth 2 (two) times daily.      . Fluticasone-Salmeterol (ADVAIR) 250-50 MCG/DOSE AEPB Inhale 1 puff into the lungs every 12 (twelve) hours.        Marland Kitchen lisinopril-hydrochlorothiazide (PRINZIDE,ZESTORETIC) 20-12.5 MG per tablet Take 0.5 tablets by mouth daily.        . methylPREDNISolone (MEDROL DOSEPAK) 4 MG tablet Take by mouth as directed. Day 1: 2 tabs before breakfast, 1 tab after lunch, and supper, & 2 tab at bedtime Day 2: 1 tab before breakfast, 1 tab after lunch and supper & 2 tabs at bedtime Day 3: 1 tab before breakfast and 1 tab after lunch, supper & at bedtime Day 4: 1 tab before breakfast, after lunch and at bedtime Day 5: 1 tab before breakfast & at bedtime Day 6: 1 tab before breakfast      . Milnacipran (SAVELLA) 50 MG TABS Take 50 mg by mouth 2 (two) times daily.        . tadalafil (CIALIS) 5 MG tablet Take 5 mg by mouth daily.        Marland Kitchen tiotropium (SPIRIVA) 18 MCG inhalation capsule Place 18 mcg into inhaler and inhale daily.        Marland Kitchen tretinoin (VESANOID) 10 MG capsule Take 10 mg by mouth daily.       Scheduled:   . albuterol  2.5 mg Nebulization Q4H WA  . amoxicillin-clavulanate  1 tablet Oral Q12H  . aspirin  81 mg Oral Daily  . cyclobenzaprine  10 mg Oral BID  . enoxaparin  40 mg Subcutaneous Daily  . Fluticasone-Salmeterol  1 puff Inhalation BID  . guaiFENesin  1,200 mg Oral BID  . hydrochlorothiazide  6.25 mg Oral Daily   And  . lisinopril  10 mg Oral Daily  . ipratropium  0.5 mg Nebulization Q4H WA  . Milnacipran  50 mg  Oral BID  . pantoprazole  40 mg Oral QAC breakfast  . predniSONE  50 mg Oral Q breakfast  . tadalafil  5 mg Oral Daily  . tiotropium  18 mcg Inhalation Daily  . DISCONTD: methylPREDNISolone (SOLU-MEDROL) injection  125 mg Intravenous Q6H  . DISCONTD: pantoprazole (PROTONIX) IV  40 mg Intravenous Q24H  . DISCONTD:  piperacillin-tazobactam (ZOSYN)  IV  3.375 g Intravenous Q8H  . DISCONTD: vancomycin  1,000 mg Intravenous Q12H   Continuous:   . sodium chloride 75 mL/hr at 09/27/11 0152   ZOX:[id number], acetaminophen, albuterol, HYDROcodone-acetaminophen, ondansetron (ZOFRAN) IV, ondansetron  Assesment: He is admitted with COPD exacerbation. He has hypertension as well. He has chronic respiratory failure. Active Problems:  * No active hospital problems. *     Plan: He is ready for discharge    LOS: 3 days   Gypsy Kellogg L 09/27/2011, 8:34 AM

## 2011-09-27 NOTE — Progress Notes (Signed)
Discharge instructions given on medications,and follow up visits,verbalized understanding.Prescriptions sent with .02 at 2lnc.No C/O pain or discomfort noted.Accompanied by staff to an awaiting vehicle.

## 2011-09-27 NOTE — Discharge Summary (Signed)
Physician Discharge Summary  Patient ID: Connor Armstrong MRN: [id number] DOB/AGE: 1951-01-20 61 y.o. Primary Care Physician:No primary provider on file. Admit date: 09/24/2011 Discharge date: 09/27/2011    Discharge Diagnoses:   Principal Problem:  *Acute exacerbation of chronic obstructive pulmonary disease (COPD) Active Problems:  Hypertension  Fibromyalgia   Current Discharge Medication List    CONTINUE these medications which have NOT CHANGED   Details  acetaminophen (TYLENOL) 500 MG tablet Take 1,000 mg by mouth every 6 (six) hours as needed. For pain     albuterol (PROVENTIL HFA;VENTOLIN HFA) 108 (90 BASE) MCG/ACT inhaler Inhale 2 puffs into the lungs every 6 (six) hours as needed. For shortness of breath     aspirin 81 MG chewable tablet Chew 81 mg by mouth daily.    cefUROXime (CEFTIN) 500 MG tablet Take 1 tablet (500 mg total) by mouth 2 (two) times daily. Qty: 20 tablet, Refills: 0    cyclobenzaprine (FLEXERIL) 10 MG tablet Take 10 mg by mouth 2 (two) times daily.      Dextromethorphan-Guaifenesin (GUAIFENESIN DM) 400-20 MG TABS Take 1 tablet by mouth 2 (two) times daily.    Fluticasone-Salmeterol (ADVAIR) 250-50 MCG/DOSE AEPB Inhale 1 puff into the lungs every 12 (twelve) hours.      lisinopril-hydrochlorothiazide (PRINZIDE,ZESTORETIC) 20-12.5 MG per tablet Take 0.5 tablets by mouth daily.      methylPREDNISolone (MEDROL DOSEPAK) 4 MG tablet Take by mouth as directed. Day 1: 2 tabs before breakfast, 1 tab after lunch, and supper, & 2 tab at bedtime Day 2: 1 tab before breakfast, 1 tab after lunch and supper & 2 tabs at bedtime Day 3: 1 tab before breakfast and 1 tab after lunch, supper & at bedtime Day 4: 1 tab before breakfast, after lunch and at bedtime Day 5: 1 tab before breakfast & at bedtime Day 6: 1 tab before breakfast    Milnacipran (SAVELLA) 50 MG TABS Take 50 mg by mouth 2 (two) times daily.      tadalafil (CIALIS) 5 MG tablet Take 5 mg by mouth  daily.      tiotropium (SPIRIVA) 18 MCG inhalation capsule Place 18 mcg into inhaler and inhale daily.      tretinoin (VESANOID) 10 MG capsule Take 10 mg by mouth daily.      STOP taking these medications     aspirin EC 81 MG tablet         Discharged Condition: Improved    Consults: None  Significant Diagnostic Studies: Dg Chest 2 View  09/24/2011  *RADIOLOGY REPORT*  Clinical Data: COPD.  Short of breath.  CHEST - 2 VIEW  Comparison: 09/17/2011  Findings: Heart size remains normal.  Mediastinal shadows remain unremarkable.  Again seen is pulmonary hyperinflation consist with emphysema with crowding of markings in the perihilar regions and lower lobes.  There is no evidence of superimposed acute pneumonia. No collapse or effusion.  No acute bony finding.  Surgical clips in the lower right neck remain evident.  IMPRESSION: Emphysema and pulmonary scarring.  No sign of active consolidation or collapse.  Original Report Authenticated By: Thomasenia Sales, M.D.   Dg Pneumonia Chest Port1v  09/17/2011  *RADIOLOGY REPORT*  Clinical Data: Productive cough and shortness of breath.  Fever.  PORTABLE CHEST - 1 VIEW  Comparison: 12/19/2009.  Findings: Trachea is midline.  Heart size normal.  Emphysema.  No definite airspace consolidation or pleural fluid.  IMPRESSION: Emphysema without acute finding.  Original Report Authenticated By: Reyes Ivan,  M.D.    Lab Results: Basic Metabolic Panel:  Basename 09/25/11 0512 09/24/11 1140  NA 140 139  K 4.8 4.9  CL 88* 89*  CO2 >45* >40*  GLUCOSE 103* 135*  BUN 16 20  CREATININE 1.12 1.07  CALCIUM 9.8 9.9  MG -- --  PHOS -- --   Liver Function Tests: No results found for this basename: AST:2,ALT:2,ALKPHOS:2,BILITOT:2,PROT:2,ALBUMIN:2 in the last 72 hours   CBC:  Basename 09/25/11 0512 09/24/11 1140  WBC 14.1* 15.2*  NEUTROABS -- 12.9*  HGB 14.3 13.9  HCT 45.6 44.6  MCV 97.2 96.5  PLT 378 354    Recent Results (from the past  240 hour(s))  CULTURE, BLOOD (ROUTINE X 2)     Status: Normal   Collection Time   09/17/11 12:13 PM      Component Value Range Status Comment   Specimen Description BLOOD LEFT ANTECUBITAL DRAWN BY RN   Final    Special Requests BOTTLES DRAWN AEROBIC AND ANAEROBIC 4CC EACH   Final    Culture NO GROWTH 5 DAYS   Final    Report Status 09/22/2011 FINAL   Final   CULTURE, BLOOD (ROUTINE X 2)     Status: Normal   Collection Time   09/17/11 12:26 PM      Component Value Range Status Comment   Specimen Description BLOOD RIGHT ANTECUBITAL   Final    Special Requests BOTTLES DRAWN AEROBIC AND ANAEROBIC 4CC   Final    Culture NO GROWTH 5 DAYS   Final    Report Status 09/22/2011 FINAL   Final   MRSA PCR SCREENING     Status: Normal   Collection Time   09/17/11  3:39 PM      Component Value Range Status Comment   MRSA by PCR NEGATIVE  NEGATIVE  Final      Hospital Course: He was admitted with COPD exacerbation. He had been hospitalized and then discharged and returned about 3 days later. He says he's been coughing and congested. He was started on intravenous antibiotics inhaled bronchodilators and IV steroids. He improved to the point that he said he was at baseline. I switched him to oral antibiotics and steroids to make sure he was okay this time and he tolerated this well and had no deterioration of his condition. He is discharged home in improved condition  Discharge Exam: Blood pressure 116/69, pulse 85, temperature 97.5 F (36.4 C), temperature source Oral, resp. rate 18, height 5\' 10"  (1.778 m), weight 73.664 kg (162 lb 6.4 oz), SpO2 95.00%. He has some rhonchi which are chronic on his chest exam he looks comfortable  Disposition: Home. He does not want home health services      Signed: Fredirick Maudlin Pager [phone]  09/27/2011, 8:42 AM

## 2011-12-07 ENCOUNTER — Inpatient Hospital Stay (HOSPITAL_COMMUNITY)
Admission: EM | Admit: 2011-12-07 | Discharge: 2011-12-14 | DRG: 208 | Disposition: A | Discharge status: Home Health | Payer: Medicaid Other | Attending: Pulmonary Disease | Admitting: Pulmonary Disease

## 2011-12-07 ENCOUNTER — Emergency Department (HOSPITAL_COMMUNITY): Payer: Medicaid Other

## 2011-12-07 ENCOUNTER — Encounter (HOSPITAL_COMMUNITY): Payer: Self-pay | Admitting: *Deleted

## 2011-12-07 ENCOUNTER — Other Ambulatory Visit: Payer: Self-pay

## 2011-12-07 DIAGNOSIS — K5900 Constipation, unspecified: Secondary | ICD-10-CM | POA: Diagnosis present

## 2011-12-07 DIAGNOSIS — J9620 Acute and chronic respiratory failure, unspecified whether with hypoxia or hypercapnia: Secondary | ICD-10-CM | POA: Diagnosis present

## 2011-12-07 DIAGNOSIS — I10 Essential (primary) hypertension: Secondary | ICD-10-CM | POA: Diagnosis present

## 2011-12-07 DIAGNOSIS — Z7982 Long term (current) use of aspirin: Secondary | ICD-10-CM

## 2011-12-07 DIAGNOSIS — IMO0001 Reserved for inherently not codable concepts without codable children: Secondary | ICD-10-CM | POA: Diagnosis present

## 2011-12-07 DIAGNOSIS — R0689 Other abnormalities of breathing: Secondary | ICD-10-CM

## 2011-12-07 DIAGNOSIS — B029 Zoster without complications: Secondary | ICD-10-CM | POA: Diagnosis present

## 2011-12-07 DIAGNOSIS — E872 Acidosis, unspecified: Secondary | ICD-10-CM | POA: Diagnosis present

## 2011-12-07 DIAGNOSIS — M797 Fibromyalgia: Secondary | ICD-10-CM | POA: Diagnosis present

## 2011-12-07 DIAGNOSIS — J441 Chronic obstructive pulmonary disease with (acute) exacerbation: Principal | ICD-10-CM | POA: Diagnosis present

## 2011-12-07 DIAGNOSIS — F17200 Nicotine dependence, unspecified, uncomplicated: Secondary | ICD-10-CM | POA: Diagnosis present

## 2011-12-07 HISTORY — DX: Zoster without complications: B02.9

## 2011-12-07 LAB — BLOOD GAS, ARTERIAL
Acid-Base Excess: 2.8 mmol/L — ABNORMAL HIGH (ref 0.0–2.0)
Acid-Base Excess: 1.5 mmol/L (ref 0.0–2.0)
O2 Content: 4 L/min
O2 Content: 4 L/min
O2 Saturation: 92.4 %
O2 Saturation: 93.1 %
Patient temperature: 37
TCO2: 27.6 mmol/L (ref 0–100)
pCO2 arterial: 74.9 mmHg (ref 35.0–45.0)
pO2, Arterial: 73.5 mmHg — ABNORMAL LOW (ref 80.0–100.0)

## 2011-12-07 LAB — DIFFERENTIAL
Basophils Absolute: 0 10*3/uL (ref 0.0–0.1)
Basophils Relative: 0 % (ref 0–1)
Eosinophils Absolute: 0 10*3/uL (ref 0.0–0.7)
Eosinophils Relative: 0 % (ref 0–5)
Monocytes Absolute: 1.5 10*3/uL — ABNORMAL HIGH (ref 0.1–1.0)
Monocytes Relative: 17 % — ABNORMAL HIGH (ref 3–12)

## 2011-12-07 LAB — CBC
HCT: 41.7 % (ref 39.0–52.0)
MCH: 30.3 pg (ref 26.0–34.0)
MCHC: 32.1 g/dL (ref 30.0–36.0)
MCV: 94.3 fL (ref 78.0–100.0)
RDW: 15.2 % (ref 11.5–15.5)

## 2011-12-07 LAB — BASIC METABOLIC PANEL
BUN: 62 mg/dL — ABNORMAL HIGH (ref 6–23)
Calcium: 9.3 mg/dL (ref 8.4–10.5)
Creatinine, Ser: 2.32 mg/dL — ABNORMAL HIGH (ref 0.50–1.35)
GFR calc Af Amer: 33 mL/min — ABNORMAL LOW (ref >90)

## 2011-12-07 MED ORDER — SODIUM CHLORIDE 0.9 % IV SOLN
INTRAVENOUS | Status: DC
  Administered 2011-12-08: 1000 mL via INTRAVENOUS

## 2011-12-07 MED ORDER — ACETAMINOPHEN 325 MG PO TABS
650.0000 mg | ORAL_TABLET | Freq: Once | ORAL | Status: AC
  Administered 2011-12-07: 650 mg via ORAL
  Filled 2011-12-07: qty 2

## 2011-12-07 MED ORDER — SODIUM CHLORIDE 0.9 % IV BOLUS (SEPSIS)
1000.0000 mL | Freq: Once | INTRAVENOUS | Status: AC
  Administered 2011-12-07: 1000 mL via INTRAVENOUS

## 2011-12-07 MED ORDER — IPRATROPIUM BROMIDE 0.02 % IN SOLN
0.5000 mg | RESPIRATORY_TRACT | Status: DC
  Administered 2011-12-07 – 2011-12-13 (×34): 0.5 mg via RESPIRATORY_TRACT
  Filled 2011-12-07 (×33): qty 2.5

## 2011-12-07 MED ORDER — SODIUM CHLORIDE 0.9 % IV SOLN
Freq: Once | INTRAVENOUS | Status: AC
  Administered 2011-12-07: 1000 mL via INTRAVENOUS

## 2011-12-07 MED ORDER — METHYLPREDNISOLONE SODIUM SUCC 125 MG IJ SOLR
INTRAMUSCULAR | Status: AC
  Administered 2011-12-07: 125 mg via INTRAVENOUS
  Filled 2011-12-07: qty 2

## 2011-12-07 MED ORDER — METHYLPREDNISOLONE SODIUM SUCC 125 MG IJ SOLR
125.0000 mg | Freq: Once | INTRAMUSCULAR | Status: AC
  Administered 2011-12-07: 125 mg via INTRAVENOUS

## 2011-12-07 MED ORDER — MOXIFLOXACIN HCL IN NACL 400 MG/250ML IV SOLN
400.0000 mg | Freq: Once | INTRAVENOUS | Status: AC
  Administered 2011-12-07: 400 mg via INTRAVENOUS
  Filled 2011-12-07: qty 250

## 2011-12-07 MED ORDER — ALBUTEROL (5 MG/ML) CONTINUOUS INHALATION SOLN
10.0000 mg/h | INHALATION_SOLUTION | Freq: Once | RESPIRATORY_TRACT | Status: AC
  Administered 2011-12-07: 10 mg/h via RESPIRATORY_TRACT
  Filled 2011-12-07: qty 20

## 2011-12-07 MED ORDER — ALBUTEROL SULFATE (5 MG/ML) 0.5% IN NEBU
2.5000 mg | INHALATION_SOLUTION | RESPIRATORY_TRACT | Status: DC
  Administered 2011-12-07 – 2011-12-13 (×36): 2.5 mg via RESPIRATORY_TRACT
  Filled 2011-12-07 (×35): qty 0.5

## 2011-12-07 NOTE — ED Notes (Signed)
CRITICAL VALUE ALERT  Critical value received:  ABG  Date of notification:  12-07-11  Time of notification:  2045  Critical value read back:yes  Nurse who received alert:  Thornton Dales, RN  MD notified (1st page):  king  Time of first page:  2047  MD notified (2nd page):  Time of second page:  Responding MD:  king  Time MD responded:  2050

## 2011-12-07 NOTE — ED Provider Notes (Signed)
History     CSN: [id number]  Arrival date & time 12/07/11  1952   First MD Initiated Contact with Patient 12/07/11 1957      Chief Complaint  Patient presents with  . Shortness of Breath    (Consider location/radiation/quality/duration/timing/severity/associated sxs/prior treatment) Patient is a 61 y.o. male presenting with shortness of breath. The history is provided by the patient. No language interpreter was used.  Shortness of Breath  The current episode started yesterday. The onset was gradual. The problem occurs continuously. The problem has been gradually worsening. The problem is moderate. The symptoms are relieved by nothing. The symptoms are aggravated by activity. Associated symptoms include cough, shortness of breath and wheezing. Pertinent negatives include no chest pain, no chest pressure, no orthopnea, no fever, no rhinorrhea and no sore throat. The cough is productive. Nothing relieves the cough. The cough is worsened by activity. He has inhaled smoke recently. He is currently using steroids. He has had prior hospitalizations. He has had prior ICU admissions.    Past Medical History  Diagnosis Date  . COPD (chronic obstructive pulmonary disease)   . Hypertension   . Fibromyalgia   . Shingles   . Cancer     Past Surgical History  Procedure Date  . Hernia repair   . Thyroid surgery   . Parathyroid surgery   . Kidney surgery     History reviewed. No pertinent family history.  History  Substance Use Topics  . Smoking status: Current Everyday Smoker -- 0.5 packs/day  . Smokeless tobacco: Not on file  . Alcohol Use: No      Review of Systems  Constitutional: Negative for fever, activity change, appetite change and fatigue.  HENT: Negative for congestion, sore throat, rhinorrhea, neck pain and neck stiffness.   Respiratory: Positive for cough, shortness of breath and wheezing.   Cardiovascular: Negative for chest pain, palpitations and orthopnea.    Gastrointestinal: Negative for nausea, vomiting and abdominal pain.  Genitourinary: Negative for dysuria, urgency, frequency and flank pain.  Musculoskeletal: Negative for myalgias, back pain and arthralgias.  Neurological: Negative for dizziness, weakness, light-headedness, numbness and headaches.  All other systems reviewed and are negative.    Allergies  Review of patient's allergies indicates no known allergies.  Home Medications   Current Outpatient Rx  Name Route Sig Dispense Refill  . ALBUTEROL SULFATE HFA 108 (90 BASE) MCG/ACT IN AERS Inhalation Inhale 2 puffs into the lungs every 6 (six) hours as needed. For shortness of breath     . FLUTICASONE-SALMETEROL 250-50 MCG/DOSE IN AEPB Inhalation Inhale 1 puff into the lungs every 12 (twelve) hours.      Marland Kitchen TADALAFIL 5 MG PO TABS Oral Take 5 mg by mouth daily.      Marland Kitchen TIOTROPIUM BROMIDE MONOHYDRATE 18 MCG IN CAPS Inhalation Place 18 mcg into inhaler and inhale daily.      . ACETAMINOPHEN 500 MG PO TABS Oral Take 1,000 mg by mouth every 6 (six) hours as needed. For pain     . ASPIRIN 81 MG PO CHEW Oral Chew 81 mg by mouth daily.    . CYCLOBENZAPRINE HCL 10 MG PO TABS Oral Take 10 mg by mouth 2 (two) times daily.      . GUAIFENESIN DM 400-20 MG PO TABS Oral Take 1 tablet by mouth 2 (two) times daily.    Marland Kitchen LISINOPRIL-HYDROCHLOROTHIAZIDE 20-12.5 MG PO TABS Oral Take 0.5 tablets by mouth daily.      Marland Kitchen MILNACIPRAN HCL 50 MG  PO TABS Oral Take 50 mg by mouth 2 (two) times daily.      Marland Kitchen PREDNISONE 10 MG PO TABS  Take 4 tablets daily for 3 days, then 3 tablets daily for 3 days, then 2 tablets daily for 3 days, then 1 tablet daily for 3 days and stop 30 tablet 0    BP 104/82  Pulse 131  Temp(Src) 100.3 F (37.9 C) (Oral)  Resp 32  Ht 5\' 10"  (1.778 m)  Wt 150 lb (68.04 kg)  BMI 21.52 kg/m2  SpO2 89%  Physical Exam  Nursing note and vitals reviewed. Constitutional: He is oriented to person, place, and time. He appears well-developed and  well-nourished.  HENT:  Head: Normocephalic and atraumatic.  Mouth/Throat: Oropharynx is clear and moist.  Eyes: Conjunctivae and EOM are normal. Pupils are equal, round, and reactive to light.  Neck: Normal range of motion. Neck supple.  Cardiovascular: Normal rate, regular rhythm, normal heart sounds and intact distal pulses.  Exam reveals no gallop and no friction rub.   No murmur heard. Pulmonary/Chest: He is in respiratory distress. He exhibits no tenderness.       Diffusely diminished breath sounds. Faint wheezing. Poor air exchange  Abdominal: Soft. Bowel sounds are normal. There is no tenderness.  Musculoskeletal: Normal range of motion. He exhibits no edema and no tenderness.  Neurological: He is alert and oriented to person, place, and time. No cranial nerve deficit.  Skin: Skin is warm and dry.    ED Course  Procedures (including critical care time)  CRITICAL CARE Performed by: Dayton Bailiff   Total critical care time: 42 min  Critical care time was exclusive of separately billable procedures and treating other patients.  Critical care was necessary to treat or prevent imminent or life-threatening deterioration.  Critical care was time spent personally by me on the following activities: development of treatment plan with patient and/or surrogate as well as nursing, discussions with consultants, evaluation of patient's response to treatment, examination of patient, obtaining history from patient or surrogate, ordering and performing treatments and interventions, ordering and review of laboratory studies, ordering and review of radiographic studies, pulse oximetry and re-evaluation of patient's condition.   Date: 12/07/2011  Rate: 129  Rhythm: sinus tachycardia  QRS Axis: right  Intervals: normal  ST/T Wave abnormalities: normal  Conduction Disutrbances:none  Narrative Interpretation:   Old EKG Reviewed: unchanged  Labs Reviewed  DIFFERENTIAL - Abnormal; Notable for  the following:    Monocytes Relative 17 (*)    Monocytes Absolute 1.5 (*)    All other components within normal limits  BASIC METABOLIC PANEL - Abnormal; Notable for the following:    Sodium 134 (*)    Potassium 5.2 (*)    Chloride 95 (*)    Glucose, Bld 139 (*)    BUN 62 (*)    Creatinine, Ser 2.32 (*)    GFR calc non Af Amer 29 (*)    GFR calc Af Amer 33 (*)    All other components within normal limits  BLOOD GAS, ARTERIAL - Abnormal; Notable for the following:    pH, Arterial 7.236 (*)    pCO2 arterial 72.5 (*)    pO2, Arterial 70.8 (*)    Bicarbonate 29.7 (*)    Acid-Base Excess 2.8 (*)    All other components within normal limits  CBC  CULTURE, BLOOD (ROUTINE X 2)  CULTURE, BLOOD (ROUTINE X 2)   Dg Chest Portable 1 View  12/07/2011  *RADIOLOGY REPORT*  Clinical Data: SOB.  Cough and wheezing.  History of COPD.  PORTABLE CHEST - 1 VIEW  Comparison: 09/17/2011  Findings: Heart size and mediastinal contours appear normal.  There is enlargement of bilateral pulmonary arteries suggesting PA hypertension.  Lungs are hyperinflated and there are coarsened interstitial markings compatible with advanced COPD.  No superimposed airspace consolidation.  IMPRESSION:  1.  No acute cardiopulmonary abnormalities. 2.  COPD. 3.  Suspect PA hypertension.  Original Report Authenticated By: Rosealee Albee, M.D.     1. COPD exacerbation   2. Shingles   3. Hypercapnia       MDM  Severe COPD exacerbation hypercapnia. Was placed on BiPAP given the presence of hypercapnia and respiratory distress. Received an albuterol continuous treatment. Blood cultures were drawn due to the fever. He received Tylenol. Was also placed on Avelox. There is no evidence of pneumonia on chest x-ray. We'll place in the ICU given tenuous respiratory status. Will be admitted by Dr. Megan Mans. Additional etiologies of respiratory distress were considered including pulmonary embolus although this is considered unlikely as  he has evidence of COPD on examination and laboratory studies.        Dayton Bailiff, MD 12/07/11 2131

## 2011-12-07 NOTE — ED Notes (Signed)
Reported pt w/ increased sob x 1 days. Was given dou neb enroute. Cough present.

## 2011-12-08 ENCOUNTER — Inpatient Hospital Stay (HOSPITAL_COMMUNITY): Payer: Medicaid Other

## 2011-12-08 LAB — BLOOD GAS, ARTERIAL
Expiratory PAP: 5
Inspiratory PAP: 14
MECHVT: 580 mL
O2 Saturation: 97.4 %
PEEP: 5 cmH2O
Patient temperature: 37
RATE: 12 resp/min
RATE: 14 resp/min
pCO2 arterial: 79.6 mmHg (ref 35.0–45.0)
pH, Arterial: 7.306 — ABNORMAL LOW (ref 7.350–7.450)
pO2, Arterial: 81.6 mmHg (ref 80.0–100.0)

## 2011-12-08 LAB — MRSA PCR SCREENING: MRSA by PCR: NEGATIVE

## 2011-12-08 MED ORDER — NOREPINEPHRINE BITARTRATE 1 MG/ML IJ SOLN
INTRAMUSCULAR | Status: AC
  Filled 2011-12-08: qty 4

## 2011-12-08 MED ORDER — LIDOCAINE HCL (CARDIAC) 20 MG/ML IV SOLN
INTRAVENOUS | Status: AC
  Filled 2011-12-08: qty 5

## 2011-12-08 MED ORDER — FENTANYL CITRATE 0.05 MG/ML IJ SOLN
INTRAMUSCULAR | Status: AC
  Filled 2011-12-08: qty 50

## 2011-12-08 MED ORDER — SODIUM CHLORIDE 0.9 % IV SOLN
INTRAVENOUS | Status: DC
  Administered 2011-12-08 – 2011-12-09 (×3): 1000 mL via INTRAVENOUS
  Administered 2011-12-09 – 2011-12-14 (×6): via INTRAVENOUS

## 2011-12-08 MED ORDER — FENTANYL BOLUS VIA INFUSION
50.0000 ug | Freq: Four times a day (QID) | INTRAVENOUS | Status: DC | PRN
  Filled 2011-12-08: qty 100

## 2011-12-08 MED ORDER — ROCURONIUM BROMIDE 50 MG/5ML IV SOLN
INTRAVENOUS | Status: AC
  Filled 2011-12-08: qty 2

## 2011-12-08 MED ORDER — SODIUM CHLORIDE 0.9 % IJ SOLN
INTRAMUSCULAR | Status: AC
  Administered 2011-12-08: 3 mL
  Filled 2011-12-08: qty 3

## 2011-12-08 MED ORDER — MIDAZOLAM HCL 2 MG/2ML IJ SOLN
2.0000 mg | INTRAMUSCULAR | Status: DC | PRN
  Administered 2011-12-08: 4 mg via INTRAVENOUS
  Administered 2011-12-08: 2 mg via INTRAVENOUS
  Filled 2011-12-08: qty 4
  Filled 2011-12-08: qty 2

## 2011-12-08 MED ORDER — CHLORHEXIDINE GLUCONATE 0.12 % MT SOLN
15.0000 mL | Freq: Two times a day (BID) | OROMUCOSAL | Status: DC
  Administered 2011-12-08 – 2011-12-10 (×6): 15 mL via OROMUCOSAL
  Filled 2011-12-08 (×6): qty 15

## 2011-12-08 MED ORDER — MOXIFLOXACIN HCL IN NACL 400 MG/250ML IV SOLN
400.0000 mg | INTRAVENOUS | Status: DC
  Administered 2011-12-08 – 2011-12-12 (×5): 400 mg via INTRAVENOUS
  Filled 2011-12-08 (×5): qty 250

## 2011-12-08 MED ORDER — SODIUM CHLORIDE 0.9 % IV BOLUS (SEPSIS): 1000.0000 mL | Freq: Once | INTRAVENOUS | Status: DC

## 2011-12-08 MED ORDER — ACYCLOVIR 800 MG PO TABS
800.0000 mg | ORAL_TABLET | Freq: Every day | ORAL | Status: DC
  Administered 2011-12-08 – 2011-12-14 (×30): 800 mg via ORAL
  Filled 2011-12-08 (×47): qty 1

## 2011-12-08 MED ORDER — MIDAZOLAM HCL 2 MG/2ML IJ SOLN
6.0000 mg | Freq: Once | INTRAMUSCULAR | Status: AC
  Administered 2011-12-08: 6 mg via INTRAVENOUS

## 2011-12-08 MED ORDER — LISINOPRIL 10 MG PO TABS: 10.0000 mg | ORAL_TABLET | Freq: Every day | ORAL | Status: DC

## 2011-12-08 MED ORDER — MIDAZOLAM HCL 2 MG/2ML IJ SOLN
INTRAMUSCULAR | Status: AC
  Filled 2011-12-08: qty 6

## 2011-12-08 MED ORDER — ETOMIDATE 2 MG/ML IV SOLN
INTRAVENOUS | Status: AC
  Administered 2011-12-08: 20 mg
  Filled 2011-12-08: qty 20

## 2011-12-08 MED ORDER — MIDAZOLAM BOLUS VIA INFUSION
1.0000 mg | INTRAVENOUS | Status: DC | PRN
  Filled 2011-12-08: qty 2

## 2011-12-08 MED ORDER — METHYLPREDNISOLONE SODIUM SUCC 125 MG IJ SOLR
125.0000 mg | Freq: Four times a day (QID) | INTRAMUSCULAR | Status: DC
  Administered 2011-12-08 – 2011-12-13 (×18): 125 mg via INTRAVENOUS
  Filled 2011-12-08 (×19): qty 2

## 2011-12-08 MED ORDER — METHYLPREDNISOLONE SODIUM SUCC 125 MG IJ SOLR
250.0000 mg | Freq: Four times a day (QID) | INTRAMUSCULAR | Status: AC
  Administered 2011-12-08 (×3): 250 mg via INTRAVENOUS
  Filled 2011-12-08 (×3): qty 4

## 2011-12-08 MED ORDER — LORAZEPAM 2 MG/ML IJ SOLN
4.0000 mg | Freq: Once | INTRAMUSCULAR | Status: DC
  Filled 2011-12-08: qty 2

## 2011-12-08 MED ORDER — SODIUM CHLORIDE 0.9 % IV BOLUS (SEPSIS)
1000.0000 mL | Freq: Once | INTRAVENOUS | Status: AC
  Administered 2011-12-08: 1000 mL via INTRAVENOUS

## 2011-12-08 MED ORDER — FENTANYL CITRATE 0.05 MG/ML IJ SOLN
50.0000 ug | INTRAMUSCULAR | Status: DC | PRN
  Administered 2011-12-08: 100 ug via INTRAVENOUS
  Administered 2011-12-08: 50 ug via INTRAVENOUS
  Filled 2011-12-08 (×2): qty 2

## 2011-12-08 MED ORDER — MIDAZOLAM HCL 2 MG/2ML IJ SOLN: 2.0000 mg | Freq: Once | INTRAMUSCULAR | Status: DC

## 2011-12-08 MED ORDER — ASPIRIN 81 MG PO CHEW
81.0000 mg | CHEWABLE_TABLET | Freq: Every day | ORAL | Status: DC
  Administered 2011-12-08 – 2011-12-14 (×7): 81 mg via ORAL
  Filled 2011-12-08 (×7): qty 1

## 2011-12-08 MED ORDER — SUCCINYLCHOLINE CHLORIDE 20 MG/ML IJ SOLN
INTRAMUSCULAR | Status: AC
  Filled 2011-12-08: qty 1

## 2011-12-08 MED ORDER — FLUTICASONE-SALMETEROL 250-50 MCG/DOSE IN AEPB
1.0000 | INHALATION_SPRAY | Freq: Two times a day (BID) | RESPIRATORY_TRACT | Status: DC
  Filled 2011-12-08: qty 14

## 2011-12-08 MED ORDER — ENOXAPARIN SODIUM 40 MG/0.4ML ~~LOC~~ SOLN: 40.0000 mg | SUBCUTANEOUS | Status: DC

## 2011-12-08 MED ORDER — BIOTENE DRY MOUTH MT LIQD
15.0000 mL | Freq: Four times a day (QID) | OROMUCOSAL | Status: DC
  Administered 2011-12-08 – 2011-12-13 (×21): 15 mL via OROMUCOSAL

## 2011-12-08 MED ORDER — CYCLOBENZAPRINE HCL 10 MG PO TABS
10.0000 mg | ORAL_TABLET | Freq: Two times a day (BID) | ORAL | Status: DC
  Administered 2011-12-08 – 2011-12-14 (×13): 10 mg via ORAL
  Filled 2011-12-08 (×13): qty 1

## 2011-12-08 MED ORDER — ENOXAPARIN SODIUM 40 MG/0.4ML ~~LOC~~ SOLN
40.0000 mg | SUBCUTANEOUS | Status: DC
  Administered 2011-12-08 – 2011-12-13 (×7): 40 mg via SUBCUTANEOUS
  Filled 2011-12-08 (×7): qty 0.4

## 2011-12-08 MED ORDER — FENTANYL CITRATE 0.05 MG/ML IJ SOLN
50.0000 ug | Freq: Once | INTRAMUSCULAR | Status: AC
  Administered 2011-12-08: 50 ug via INTRAVENOUS

## 2011-12-08 MED ORDER — PANTOPRAZOLE SODIUM 40 MG IV SOLR
40.0000 mg | INTRAVENOUS | Status: DC
  Administered 2011-12-08 – 2011-12-11 (×4): 40 mg via INTRAVENOUS
  Filled 2011-12-08 (×4): qty 40

## 2011-12-08 MED ORDER — NOREPINEPHRINE BITARTRATE 1 MG/ML IJ SOLN
2.0000 ug/min | INTRAVENOUS | Status: DC
  Administered 2011-12-08: 2 ug/min via INTRAVENOUS
  Filled 2011-12-08: qty 4

## 2011-12-08 MED ORDER — SODIUM CHLORIDE 0.9 % IV SOLN
50.0000 ug/h | INTRAVENOUS | Status: DC
  Administered 2011-12-08: 50 ug/h via INTRAVENOUS
  Administered 2011-12-10: 75 ug/h via INTRAVENOUS
  Filled 2011-12-08 (×2): qty 50

## 2011-12-08 MED ORDER — SODIUM CHLORIDE 0.9 % IV SOLN
2.0000 mg/h | INTRAVENOUS | Status: DC
  Administered 2011-12-08: 2 mg/h via INTRAVENOUS
  Administered 2011-12-09 – 2011-12-10 (×2): 5 mg/h via INTRAVENOUS
  Filled 2011-12-08 (×3): qty 10

## 2011-12-08 MED ORDER — MIDAZOLAM HCL 50 MG/10ML IJ SOLN
INTRAMUSCULAR | Status: AC
  Filled 2011-12-08: qty 1

## 2011-12-08 MED ORDER — FENTANYL CITRATE 0.05 MG/ML IJ SOLN
INTRAMUSCULAR | Status: AC
  Filled 2011-12-08: qty 2

## 2011-12-08 MED ORDER — LORAZEPAM 2 MG/ML IJ SOLN
INTRAMUSCULAR | Status: AC
Start: 2011-12-08 — End: 2011-12-08
  Administered 2011-12-08: 4 mg
  Filled 2011-12-08: qty 2

## 2011-12-08 NOTE — Progress Notes (Signed)
Subjective: He was admitted yesterday with COPD exacerbation and gradually worsened as the day went on culminating in having to be intubated and placed on the ventilator. He required levo fed because of hypotension. He has a recent diagnosis of shingles which I made when I saw him in my office earlier this week. He is sedated but still agitated despite the sedation so we may need to increase that  Objective: Vital signs in last 24 hours: Temp:  [98.2 F (36.8 C)-100.3 F (37.9 C)] 98.2 F (36.8 C) (03/29 2312) Pulse Rate:  [73-131] 82  (03/30 0600) Resp:  [14-32] 14  (03/30 0645) BP: (80-130)/(48-82) 112/73 mmHg (03/30 0645) SpO2:  [89 %-100 %] 100 % (03/30 0600) FiO2 (%):  [35 %-100 %] 49.8 % (03/30 0645) Weight:  [68.04 kg (150 lb)-71.5 kg (157 lb 10.1 oz)] 71.5 kg (157 lb 10.1 oz) (03/29 2248) Weight change:     Intake/Output from previous day: 03/29 0701 - 03/30 0700 In: 2378.8 [I.V.:1318.8; NG/GT:60; IV Piggyback:1000] Out: -   PHYSICAL EXAM General appearance: Sedated and intubated Resp: rhonchi bilaterally Cardio: regular rate and rhythm, S1, S2 normal, no murmur, click, rub or gallop GI: soft, non-tender; bowel sounds normal; no masses,  no organomegaly and With evidence of shingles on the left side Extremities: extremities normal, atraumatic, no cyanosis or edema  Lab Results:    Basic Metabolic Panel:  Basename 12/07/11 2019  NA 134*  K 5.2*  CL 95*  CO2 31  GLUCOSE 139*  BUN 62*  CREATININE 2.32*  CALCIUM 9.3  MG --  PHOS --   Liver Function Tests: No results found for this basename: AST:2,ALT:2,ALKPHOS:2,BILITOT:2,PROT:2,ALBUMIN:2 in the last 72 hours No results found for this basename: LIPASE:2,AMYLASE:2 in the last 72 hours No results found for this basename: AMMONIA:2 in the last 72 hours CBC:  Basename 12/07/11 2019  WBC 8.4  NEUTROABS 5.9  HGB 13.4  HCT 41.7  MCV 94.3  PLT 301   Cardiac Enzymes: No results found for this basename:  CKTOTAL:3,CKMB:3,CKMBINDEX:3,TROPONINI:3 in the last 72 hours BNP: No results found for this basename: PROBNP:3 in the last 72 hours D-Dimer: No results found for this basename: DDIMER:2 in the last 72 hours CBG: No results found for this basename: GLUCAP:6 in the last 72 hours Hemoglobin A1C: No results found for this basename: HGBA1C in the last 72 hours Fasting Lipid Panel: No results found for this basename: CHOL,HDL,LDLCALC,TRIG,CHOLHDL,LDLDIRECT in the last 72 hours Thyroid Function Tests: No results found for this basename: TSH,T4TOTAL,FREET4,T3FREE,THYROIDAB in the last 72 hours Anemia Panel: No results found for this basename: VITAMINB12,FOLATE,FERRITIN,TIBC,IRON,RETICCTPCT in the last 72 hours Coagulation: No results found for this basename: LABPROT:2,INR:2 in the last 72 hours Urine Drug Screen: Drugs of Abuse  No results found for this basename: labopia, cocainscrnur, labbenz, amphetmu, thcu, labbarb    Alcohol Level: No results found for this basename: ETH:2 in the last 72 hours Urinalysis: No results found for this basename: COLORURINE:2,APPERANCEUR:2,LABSPEC:2,PHURINE:2,GLUCOSEU:2,HGBUR:2,BILIRUBINUR:2,KETONESUR:2,PROTEINUR:2,UROBILINOGEN:2,NITRITE:2,LEUKOCYTESUR:2 in the last 72 hours Misc. Labs:  ABGS  Basename 12/08/11 0605  PHART 7.306*  PO2ART 101.0*  TCO2 23.4  HCO3 25.7*   CULTURES Recent Results (from the past 240 hour(s))  MRSA PCR SCREENING     Status: Normal   Collection Time   12/07/11 10:37 PM      Component Value Range Status Comment   MRSA by PCR NEGATIVE  NEGATIVE  Final    Studies/Results: Dg Chest Port 1 View  12/08/2011  *RADIOLOGY REPORT*  Clinical Data: Intubation  PORTABLE CHEST - 1 VIEW  Comparison: 12/07/2011  Findings: Interval placement of endotracheal tube with tip about 5.5 cm above the carina.  Normal heart size and pulmonary vascularity.  Emphysematous changes and fibrosis in the lungs.  No focal airspace consolidation.  No  pneumothorax.  No blunting of left costophrenic angle.  Right costophrenic angle is not included within the field of view.  IMPRESSION: The endotracheal tube placed with tip about 5.5 cm above the carina.  No other significant change since previous study.  Original Report Authenticated By: Marlon Pel, M.D.   Dg Chest Portable 1 View  12/07/2011  *RADIOLOGY REPORT*  Clinical Data: SOB.  Cough and wheezing.  History of COPD.  PORTABLE CHEST - 1 VIEW  Comparison: 09/17/2011  Findings: Heart size and mediastinal contours appear normal.  There is enlargement of bilateral pulmonary arteries suggesting PA hypertension.  Lungs are hyperinflated and there are coarsened interstitial markings compatible with advanced COPD.  No superimposed airspace consolidation.  IMPRESSION:  1.  No acute cardiopulmonary abnormalities. 2.  COPD. 3.  Suspect PA hypertension.  Original Report Authenticated By: Rosealee Albee, M.D.    Medications:  Prior to Admission:  Prescriptions prior to admission  Medication Sig Dispense Refill  . acyclovir (ZOVIRAX) 800 MG tablet Take 800 mg by mouth 5 (five) times daily.      Marland Kitchen albuterol (PROVENTIL HFA;VENTOLIN HFA) 108 (90 BASE) MCG/ACT inhaler Inhale 2 puffs into the lungs every 6 (six) hours as needed. For shortness of breath       . Fluticasone-Salmeterol (ADVAIR) 250-50 MCG/DOSE AEPB Inhale 1 puff into the lungs every 12 (twelve) hours.        . tadalafil (CIALIS) 5 MG tablet Take 5 mg by mouth daily.        Marland Kitchen tiotropium (SPIRIVA) 18 MCG inhalation capsule Place 18 mcg into inhaler and inhale daily.        Marland Kitchen acetaminophen (TYLENOL) 500 MG tablet Take 1,000 mg by mouth every 6 (six) hours as needed. For pain       . aspirin 81 MG chewable tablet Chew 81 mg by mouth daily.      . cyclobenzaprine (FLEXERIL) 10 MG tablet Take 10 mg by mouth 2 (two) times daily.        Marland Kitchen Dextromethorphan-Guaifenesin (GUAIFENESIN DM) 400-20 MG TABS Take 1 tablet by mouth 2 (two) times daily.       Marland Kitchen lisinopril-hydrochlorothiazide (PRINZIDE,ZESTORETIC) 20-12.5 MG per tablet Take 0.5 tablets by mouth daily.        . Milnacipran (SAVELLA) 50 MG TABS Take 50 mg by mouth 2 (two) times daily.        . predniSONE (DELTASONE) 10 MG tablet Take 4 tablets daily for 3 days, then 3 tablets daily for 3 days, then 2 tablets daily for 3 days, then 1 tablet daily for 3 days and stop  30 tablet  0   Scheduled:   . sodium chloride   Intravenous Once  . acetaminophen  650 mg Oral Once  . acyclovir  800 mg Oral 5 X Daily  . ipratropium  0.5 mg Nebulization Q4H   And  . albuterol  2.5 mg Nebulization Q4H  . albuterol  10 mg/hr Nebulization Once  . aspirin  81 mg Oral Daily  . cyclobenzaprine  10 mg Oral BID  . enoxaparin (LOVENOX) injection  40 mg Subcutaneous Q24H  . etomidate      . fentaNYL      . fentaNYL  50 mcg Intravenous Once  . lidocaine (cardiac) 100 mg/75ml      . LORazepam      . LORazepam  4 mg Intravenous Once  . methylPREDNISolone (SOLU-MEDROL) injection  125 mg Intravenous Once  . methylPREDNISolone (SOLU-MEDROL) injection  125 mg Intravenous Q6H  . methylPREDNISolone (SOLU-MEDROL) injection  250 mg Intravenous Q6H  . midazolam      . midazolam  2-4 mg Intravenous Once  . midazolam  6 mg Intravenous Once  . moxifloxacin  400 mg Intravenous Once  . moxifloxacin  400 mg Intravenous Q24H  . pantoprazole (PROTONIX) IV  40 mg Intravenous Q24H  . rocuronium      . sodium chloride  1,000 mL Intravenous Once  . sodium chloride  1,000 mL Intravenous Once  . succinylcholine      . DISCONTD: enoxaparin (LOVENOX) injection  40 mg Subcutaneous Q24H  . DISCONTD: Fluticasone-Salmeterol  1 puff Inhalation Q12H  . DISCONTD: lisinopril  10 mg Oral Daily  . DISCONTD: sodium chloride  1,000 mL Intravenous Once   Continuous:   . sodium chloride 1,000 mL (12/08/11 0615)  . sodium chloride 50 mL/hr at 12/08/11 0500  . fentaNYL infusion INTRAVENOUS    . midazolam (VERSED) infusion    .  norepinephrine (LEVOPHED) Adult infusion 2 mcg/min (12/08/11 0641)   XIP:[id number], midazolam, DISCONTD: fentaNYL, DISCONTD: midazolam  Assesment: He has respiratory failure and is intubated. He is been somewhat hypotensive. I think he is probably somewhat dry and I've increased his IV fluids. He has shingles which I think gave them some more respiratory compromise because of pain when he was breathing Active Problems:  * No active hospital problems. *     Plan: No change except I changed him to continuous sedation and increased his IV fluids    LOS: 1 day   Eron Staat L 12/08/2011, 7:16 AM

## 2011-12-08 NOTE — Significant Event (Signed)
Patient with progressive hypercapnic respiratory failure in spite of being on BiPAP.    Order placed to proceed with intubation.  Orders sent for vent settings, sedation protocol, and protonix for SUP while on vent.  Will adjust vent settings as needed to allow for permissive hypercapnia and avoid PEEPi.  Coralyn Helling, MD 12/08/2011, 3:44 AM [phone]

## 2011-12-08 NOTE — Progress Notes (Signed)
WUA:  PT HAD LAST SEDATION OF VERSED AT 0617.  PT THRASHING AROUND IN BED W/ ANY STIMULATION.  DOES NOT FOLLOW ANY COMMANDS. DOES MAINTAIN SPONTANEOUS RESPIRATIONS. WILL NOT OPEN HIS EYES. WILL GIVEN IV SEDATION NOW THAT WAKE UP ASSESSMENT IS COMPLETE.

## 2011-12-08 NOTE — Progress Notes (Signed)
2345-Notified MD of ABG results and also of low BP. MD orders entered.

## 2011-12-08 NOTE — Progress Notes (Signed)
0335-Notified E-link MD and Dr. Renard Matter of critical ABG and E-link MD ordered for patient to be intubated. Respiratory and AC was then notified and family was called and updated.

## 2011-12-08 NOTE — Consult Note (Signed)
I was called by the ICU team for impending respiratory failure.  The patient is in the intensive care unit and is being managed by the pulmonary critical care medicine team via [facility].  I was informed from the nursing staff that the critical care physician would like this patient intubated.  Pulse 0x 99% on bipap. RR 20. HR 94. BP 114/86   4:09 AM INTUBATION Performed by: Lyanne Co Required items: required blood products, implants, devices, and special equipment available Patient identity confirmed: provided demographic data and hospital-assigned identification number Time out: Immediately prior to procedure a "time out" was called to verify the correct patient, procedure, equipment, support staff and site/side marked as required. Indications: metabolic derangement, AMS, impending respiratory failure Intubation method: Glidescope Laryngoscopy  Preoxygenation: Bipap Sedatives: Etomidate Paralytic: Succinylcholine Tube Size: 8-0 cuffed Post-procedure assessment: chest rise and ETCO2 monitor Breath sounds: equal and absent over the epigastrium Tube secured with: ETT holder Patient tolerated the procedure well with no immediate complications.  Verbal orders for postintubation sedation were given: 6 mg of Versed and 50 mg of fentanyl.  The nurses have orders from the ICU team for propofol infusion

## 2011-12-08 NOTE — Progress Notes (Signed)
pT WAS INTUBATED LAST NIGHT AND IS STILL SEDATED AND NOT ABLE TO Connor Armstrong

## 2011-12-09 ENCOUNTER — Inpatient Hospital Stay (HOSPITAL_COMMUNITY): Payer: Medicaid Other

## 2011-12-09 LAB — BLOOD GAS, ARTERIAL
FIO2: 40 %
PEEP: 5 cmH2O
RATE: 14 resp/min
pCO2 arterial: 62 mmHg (ref 35.0–45.0)
pO2, Arterial: 140 mmHg — ABNORMAL HIGH (ref 80.0–100.0)

## 2011-12-09 LAB — DIFFERENTIAL
Lymphocytes Relative: 4 % — ABNORMAL LOW (ref 12–46)
Lymphs Abs: 0.5 10*3/uL — ABNORMAL LOW (ref 0.7–4.0)
Neutrophils Relative %: 92 % — ABNORMAL HIGH (ref 43–77)

## 2011-12-09 LAB — BASIC METABOLIC PANEL
CO2: 27 mEq/L (ref 19–32)
GFR calc non Af Amer: 62 mL/min — ABNORMAL LOW (ref >90)
Glucose, Bld: 157 mg/dL — ABNORMAL HIGH (ref 70–99)
Potassium: 4.3 mEq/L (ref 3.5–5.1)
Sodium: 140 mEq/L (ref 135–145)

## 2011-12-09 LAB — CBC
Platelets: 314 10*3/uL (ref 150–400)
RBC: 3.88 MIL/uL — ABNORMAL LOW (ref 4.22–5.81)
WBC: 12.4 10*3/uL — ABNORMAL HIGH (ref 4.0–10.5)

## 2011-12-09 MED ORDER — MAGNESIUM HYDROXIDE 400 MG/5ML PO SUSP
30.0000 mL | Freq: Every day | ORAL | Status: DC | PRN
  Administered 2011-12-10 – 2011-12-12 (×2): 30 mL via ORAL
  Filled 2011-12-09 (×2): qty 30

## 2011-12-09 MED ORDER — OSMOLITE 1.2 CAL PO LIQD
1000.0000 mL | ORAL | Status: DC
  Administered 2011-12-09: 1000 mL
  Filled 2011-12-09 (×2): qty 1000

## 2011-12-09 NOTE — Progress Notes (Signed)
Subjective: He remains intubated on the ventilator. He was able to come off Levophed. He has not had a bowel movement in several days.  Objective: Vital signs in last 24 hours: Temp:  [97.1 F (36.2 C)-98.7 F (37.1 C)] 98.5 F (36.9 C) (03/31 0800) Pulse Rate:  [69-105] 99  (03/31 0815) Resp:  [13-22] 15  (03/31 0815) BP: (76-130)/(49-91) 119/66 mmHg (03/31 0815) SpO2:  [92 %-99 %] 96 % (03/31 0815) FiO2 (%):  [39.1 %-41.2 %] 40.4 % (03/31 0815) Weight:  [74.8 kg (164 lb 14.5 oz)] 74.8 kg (164 lb 14.5 oz) (03/31 0500) Weight change: 6.761 kg (14 lb 14.5 oz) Last BM Date: 12/05/11  Intake/Output from previous day: 03/30 0701 - 03/31 0700 In: 3537 [I.V.:3167; NG/GT:120; IV Piggyback:250] Out: 2400 [Urine:2400]  PHYSICAL EXAM General appearance: no distress and Intubated and sedated Resp: rhonchi bilaterally Cardio: regular rate and rhythm, S1, S2 normal, no murmur, click, rub or gallop GI: soft, non-tender; bowel sounds normal; no masses,  no organomegaly Extremities: extremities normal, atraumatic, no cyanosis or edema  Lab Results:    Basic Metabolic Panel:  Basename 12/07/11 2019  NA 134*  K 5.2*  CL 95*  CO2 31  GLUCOSE 139*  BUN 62*  CREATININE 2.32*  CALCIUM 9.3  MG --  PHOS --   Liver Function Tests: No results found for this basename: AST:2,ALT:2,ALKPHOS:2,BILITOT:2,PROT:2,ALBUMIN:2 in the last 72 hours No results found for this basename: LIPASE:2,AMYLASE:2 in the last 72 hours No results found for this basename: AMMONIA:2 in the last 72 hours CBC:  Basename 12/07/11 2019  WBC 8.4  NEUTROABS 5.9  HGB 13.4  HCT 41.7  MCV 94.3  PLT 301   Cardiac Enzymes: No results found for this basename: CKTOTAL:3,CKMB:3,CKMBINDEX:3,TROPONINI:3 in the last 72 hours BNP: No results found for this basename: PROBNP:3 in the last 72 hours D-Dimer: No results found for this basename: DDIMER:2 in the last 72 hours CBG: No results found for this basename: GLUCAP:6  in the last 72 hours Hemoglobin A1C: No results found for this basename: HGBA1C in the last 72 hours Fasting Lipid Panel: No results found for this basename: CHOL,HDL,LDLCALC,TRIG,CHOLHDL,LDLDIRECT in the last 72 hours Thyroid Function Tests: No results found for this basename: TSH,T4TOTAL,FREET4,T3FREE,THYROIDAB in the last 72 hours Anemia Panel: No results found for this basename: VITAMINB12,FOLATE,FERRITIN,TIBC,IRON,RETICCTPCT in the last 72 hours Coagulation: No results found for this basename: LABPROT:2,INR:2 in the last 72 hours Urine Drug Screen: Drugs of Abuse  No results found for this basename: labopia, cocainscrnur, labbenz, amphetmu, thcu, labbarb    Alcohol Level: No results found for this basename: ETH:2 in the last 72 hours Urinalysis: No results found for this basename: COLORURINE:2,APPERANCEUR:2,LABSPEC:2,PHURINE:2,GLUCOSEU:2,HGBUR:2,BILIRUBINUR:2,KETONESUR:2,PROTEINUR:2,UROBILINOGEN:2,NITRITE:2,LEUKOCYTESUR:2 in the last 72 hours Misc. Labs:  ABGS  Basename 12/09/11 0750  PHART 7.280*  PO2ART 140.0*  TCO2 24.4  HCO3 28.2*   CULTURES Recent Results (from the past 240 hour(s))  MRSA PCR SCREENING     Status: Normal   Collection Time   12/07/11 10:37 PM      Component Value Range Status Comment   MRSA by PCR NEGATIVE  NEGATIVE  Final    Studies/Results: Dg Chest 1 View  12/09/2011  *RADIOLOGY REPORT*  Clinical Data: Evaluate respiratory status.  Chest congestion.  CHEST - 1 VIEW  Comparison: Chest x-ray 12/08/2011.  Findings: Tip of endotracheal tube is unchanged in position approximately 5.5 cm above the carina. A nasogastric tube is seen extending into the stomach, however, the tip of the nasogastric tube extends below the  lower margin of the image.  Lung volumes are normal.  There is increasing interstitial prominence and hazy air space opacity in the right infrahilar region.  No obscuration of the right heart border or the right hemidiaphragm.  No other  definite focal airspace consolidation.  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  No definite pleural effusions (left costophrenic sulcus is excluded from the margin of the image).  No signs of edema.  Heart size is normal.  The mediastinal contours are unremarkable.  Atherosclerotic calcifications within the arch of the aorta.  Surgical clips in the lower right cervical region, suggesting prior thyroid surgery.  IMPRESSION: 1.  Support apparatus, as above. 2.  Increasing interstitial and ill-defined airspace opacity in the right infrahilar region concerning for a developing area of consolidation in the right lower lobe posteriorly.  This may be related to infection or may be sequelae of aspiration. 3.  Atherosclerosis.  Original Report Authenticated By: Florencia Reasons, M.D.   Dg Chest Port 1 View  12/08/2011  *RADIOLOGY REPORT*  Clinical Data: Intubation  PORTABLE CHEST - 1 VIEW  Comparison: 12/07/2011  Findings: Interval placement of endotracheal tube with tip about 5.5 cm above the carina.  Normal heart size and pulmonary vascularity.  Emphysematous changes and fibrosis in the lungs.  No focal airspace consolidation.  No pneumothorax.  No blunting of left costophrenic angle.  Right costophrenic angle is not included within the field of view.  IMPRESSION: The endotracheal tube placed with tip about 5.5 cm above the carina.  No other significant change since previous study.  Original Report Authenticated By: Marlon Pel, M.D.   Dg Chest Portable 1 View  12/07/2011  *RADIOLOGY REPORT*  Clinical Data: SOB.  Cough and wheezing.  History of COPD.  PORTABLE CHEST - 1 VIEW  Comparison: 09/17/2011  Findings: Heart size and mediastinal contours appear normal.  There is enlargement of bilateral pulmonary arteries suggesting PA hypertension.  Lungs are hyperinflated and there are coarsened interstitial markings compatible with advanced COPD.  No superimposed airspace consolidation.   IMPRESSION:  1.  No acute cardiopulmonary abnormalities. 2.  COPD. 3.  Suspect PA hypertension.  Original Report Authenticated By: Rosealee Albee, M.D.    Medications:  Prior to Admission:  Prescriptions prior to admission  Medication Sig Dispense Refill  . acetaminophen (TYLENOL) 500 MG tablet Take 1,000 mg by mouth every 6 (six) hours as needed. For pain       . acyclovir (ZOVIRAX) 800 MG tablet Take 800 mg by mouth 5 (five) times daily.      Marland Kitchen albuterol (PROVENTIL HFA;VENTOLIN HFA) 108 (90 BASE) MCG/ACT inhaler Inhale 2 puffs into the lungs every 6 (six) hours as needed. For shortness of breath       . aspirin 81 MG chewable tablet Chew 81 mg by mouth daily.      . cyclobenzaprine (FLEXERIL) 10 MG tablet Take 10 mg by mouth 2 (two) times daily.        Marland Kitchen dextromethorphan-guaiFENesin (MUCINEX DM) 30-600 MG per 12 hr tablet Take 1 tablet by mouth every 12 (twelve) hours as needed. For congestion      . Fluticasone-Salmeterol (ADVAIR) 250-50 MCG/DOSE AEPB Inhale 1 puff into the lungs every 12 (twelve) hours.        Marland Kitchen HYDROcodone-acetaminophen (NORCO) 5-325 MG per tablet Take 1 tablet by mouth every 6 (six) hours as needed. For shingles pain      . lisinopril-hydrochlorothiazide (PRINZIDE,ZESTORETIC) 20-12.5 MG per  tablet Take 0.5 tablets by mouth daily.        . Milnacipran (SAVELLA) 50 MG TABS Take 50 mg by mouth 2 (two) times daily.        . tadalafil (CIALIS) 5 MG tablet Take 5 mg by mouth daily.        Marland Kitchen tiotropium (SPIRIVA) 18 MCG inhalation capsule Place 18 mcg into inhaler and inhale daily.         Scheduled:   . acyclovir  800 mg Oral 5 X Daily  . ipratropium  0.5 mg Nebulization Q4H   And  . albuterol  2.5 mg Nebulization Q4H  . antiseptic oral rinse  15 mL Mouth Rinse QID  . aspirin  81 mg Oral Daily  . chlorhexidine  15 mL Mouth Rinse BID  . cyclobenzaprine  10 mg Oral BID  . enoxaparin (LOVENOX) injection  40 mg Subcutaneous Q24H  . lidocaine (cardiac) 100 mg/68ml      .  LORazepam  4 mg Intravenous Once  . methylPREDNISolone (SOLU-MEDROL) injection  125 mg Intravenous Q6H  . methylPREDNISolone (SOLU-MEDROL) injection  250 mg Intravenous Q6H  . midazolam  2-4 mg Intravenous Once  . moxifloxacin  400 mg Intravenous Q24H  . pantoprazole (PROTONIX) IV  40 mg Intravenous Q24H  . rocuronium      . sodium chloride      . succinylcholine       Continuous:   . sodium chloride 50 mL/hr at 12/08/11 1800  . sodium chloride 1,000 mL (12/09/11 0658)  . fentaNYL infusion INTRAVENOUS 50 mcg/hr (12/09/11 0658)  . midazolam (VERSED) infusion 3 mg/hr (12/09/11 0658)  . norepinephrine (LEVOPHED) Adult infusion Stopped (12/09/11 0400)   OZH:[id number], midazolam  Assesment: He has acute on chronic respiratory failure. He is still acidemic. He had what appeared to be some acute renal failure which I think is from dehydration. His BMET  is pending. He is not ready for extubation. Active Problems:  * No active hospital problems. *     Plan: Continue ventilator support. He will have milk of magnesia for constipation. I will start tube feedings. He will have repeat laboratory work x-ray etc. in the morning    LOS: 2 days   Connor Armstrong 12/09/2011, 9:20 AM

## 2011-12-09 NOTE — H&P (Signed)
Connor Armstrong MRN: [id number] DOB/AGE: 05-21-1951 61 y.o. Primary Care Physician:No primary provider on file. Admit date: 12/07/2011 Chief Complaint: Shortness of breath HPI: This is a 61 year old with known severe COPD. He had been in his usual state of poor health when he developed a rash on the left side of his chest and came out office last week and was found to have shingles. He seemed to be tolerating it fairly well but became increasingly short of breath over the next several days to the point that he came to the emergency room and was found to be in respiratory distress. He did well initially after admission but then ended up having to be intubated because of increasing respiratory distress and respiratory acidemia. He has fibromyalgia in addition to the COPD.  Past Medical History  Diagnosis Date  . COPD (chronic obstructive pulmonary disease)   . Hypertension   . Fibromyalgia   . Shingles   . Cancer    Past Surgical History  Procedure Date  . Hernia repair   . Thyroid surgery   . Parathyroid surgery   . Kidney surgery         Family History  Problem Relation Age of Onset  . Hypertension Mother     Social History:  reports that he has been smoking.  He does not have any smokeless tobacco history on file. He reports that he does not drink alcohol or use illicit drugs.   Allergies: No Known Allergies  Medications Prior to Admission  Medication Dose Route Frequency Provider Last Rate Last Dose  . 0.9 %  sodium chloride infusion   Intravenous Once Dayton Bailiff, MD 50 mL/hr at 12/07/11 2110 1,000 mL at 12/07/11 2110  . 0.9 %  sodium chloride infusion   Intravenous Continuous Angus Edilia Bo, MD 50 mL/hr at 12/08/11 1800    . 0.9 %  sodium chloride infusion   Intravenous Continuous Fredirick Maudlin, MD 150 mL/hr at 12/09/11 0658 1,000 mL at 12/09/11 0658  . acetaminophen (TYLENOL) tablet 650 mg  650 mg Oral Once Dayton Bailiff, MD   650 mg at 12/07/11 2142  . acyclovir  (ZOVIRAX) tablet 800 mg  800 mg Oral 5 X Daily Angus Edilia Bo, MD   800 mg at 12/09/11 0858  . ipratropium (ATROVENT) nebulizer solution 0.5 mg  0.5 mg Nebulization Q4H Angus G McInnis, MD   0.5 mg at 12/09/11 0745   And  . albuterol (PROVENTIL) (5 MG/ML) 0.5% nebulizer solution 2.5 mg  2.5 mg Nebulization Q4H Angus Edilia Bo, MD   2.5 mg at 12/09/11 0746  . albuterol (PROVENTIL,VENTOLIN) solution continuous neb  10 mg/hr Nebulization Once Dayton Bailiff, MD   10 mg/hr at 12/07/11 2023  . antiseptic oral rinse (BIOTENE) solution 15 mL  15 mL Mouth Rinse QID Fredirick Maudlin, MD   15 mL at 12/09/11 0431  . aspirin chewable tablet 81 mg  81 mg Oral Daily Alice Reichert, MD   81 mg at 12/09/11 0902  . chlorhexidine (PERIDEX) 0.12 % solution 15 mL  15 mL Mouth Rinse BID Fredirick Maudlin, MD   15 mL at 12/09/11 0858  . cyclobenzaprine (FLEXERIL) tablet 10 mg  10 mg Oral BID Alice Reichert, MD   10 mg at 12/09/11 0902  . enoxaparin (LOVENOX) injection 40 mg  40 mg Subcutaneous Q24H Alice Reichert, MD   40 mg at 12/08/11 2144  . etomidate (AMIDATE) 2 MG/ML injection  20 mg at 12/08/11 0400  . fentaNYL (SUBLIMAZE) 10 mcg/mL in sodium chloride 0.9 % 250 mL infusion  50-400 mcg/hr Intravenous Titrated Fredirick Maudlin, MD 5 mL/hr at 12/09/11 0658 50 mcg/hr at 12/09/11 0658  . fentaNYL (SUBLIMAZE) injection 50 mcg  50 mcg Intravenous Once Lyanne Co, MD   50 mcg at 12/08/11 0416  . fentaNYL (SUBLIMAZE) injection 50-100 mcg  50-100 mcg Intravenous Q2H PRN Coralyn Helling, MD   100 mcg at 12/08/11 0832  . lidocaine (cardiac) 100 mg/69ml (XYLOCAINE) 20 MG/ML injection 2%           . LORazepam (ATIVAN) 2 MG/ML injection        4 mg at 12/08/11 0438  . LORazepam (ATIVAN) injection 4 mg  4 mg Intravenous Once Coralyn Helling, MD      . methylPREDNISolone sodium succinate (SOLU-MEDROL) 125 mg/2 mL injection 125 mg  125 mg Intravenous Once Dayton Bailiff, MD   125 mg at 12/07/11 2012  . methylPREDNISolone sodium  succinate (SOLU-MEDROL) 125 mg/2 mL injection 125 mg  125 mg Intravenous Q6H Angus Edilia Bo, MD   125 mg at 12/09/11 0858  . methylPREDNISolone sodium succinate (SOLU-MEDROL) 125 mg/2 mL injection 250 mg  250 mg Intravenous Q6H Angus G McInnis, MD   250 mg at 12/08/11 1416  . midazolam (VERSED) 1 mg/mL in sodium chloride 0.9 % 50 mL infusion  2-10 mg/hr Intravenous Titrated Fredirick Maudlin, MD 3 mL/hr at 12/09/11 0658 3 mg/hr at 12/09/11 0658  . midazolam (VERSED) injection 2-4 mg  2-4 mg Intravenous Q2H PRN Coralyn Helling, MD   2 mg at 12/08/11 1191  . midazolam (VERSED) injection 2-4 mg  2-4 mg Intravenous Once Coralyn Helling, MD      . midazolam (VERSED) injection 6 mg  6 mg Intravenous Once Lyanne Co, MD   6 mg at 12/08/11 0416  . moxifloxacin (AVELOX) IVPB 400 mg  400 mg Intravenous Once Dayton Bailiff, MD 250 mL/hr at 12/07/11 2111 400 mg at 12/07/11 2111  . moxifloxacin (AVELOX) IVPB 400 mg  400 mg Intravenous Q24H Alice Reichert, MD   400 mg at 12/08/11 2057  . norepinephrine (LEVOPHED) 4,000 mcg in dextrose 5 % 250 mL infusion  2-50 mcg/min Intravenous Titrated Alice Reichert, MD   1 mcg/min at 12/09/11 0100  . pantoprazole (PROTONIX) injection 40 mg  40 mg Intravenous Q24H Coralyn Helling, MD   40 mg at 12/09/11 0330  . rocuronium (ZEMURON) 50 MG/5ML injection           . sodium chloride 0.9 % bolus 1,000 mL  1,000 mL Intravenous Once Dayton Bailiff, MD   1,000 mL at 12/07/11 2014  . sodium chloride 0.9 % bolus 1,000 mL  1,000 mL Intravenous Once Alice Reichert, MD   1,000 mL at 12/08/11 0037  . sodium chloride 0.9 % injection        3 mL at 12/08/11 2058  . succinylcholine (ANECTINE) 20 MG/ML injection           . DISCONTD: enoxaparin (LOVENOX) injection 40 mg  40 mg Subcutaneous Q24H Angus Edilia Bo, MD      . DISCONTD: fentaNYL (SUBLIMAZE) bolus via infusion 50-100 mcg  50-100 mcg Intravenous Q6H PRN Fredirick Maudlin, MD      . DISCONTD: Fluticasone-Salmeterol (ADVAIR) 250-50 MCG/DOSE inhaler  1 puff  1 puff Inhalation Q12H Angus Edilia Bo, MD      . DISCONTD: lisinopril (PRINIVIL,ZESTRIL) tablet 10 mg  10 mg Oral Daily Angus Edilia Bo, MD      . DISCONTD: midazolam (VERSED) bolus via infusion 1-2 mg  1-2 mg Intravenous Q2H PRN Fredirick Maudlin, MD      . DISCONTD: sodium chloride 0.9 % bolus 1,000 mL  1,000 mL Intravenous Once Angus Edilia Bo, MD       Medications Prior to Admission  Medication Sig Dispense Refill  . acetaminophen (TYLENOL) 500 MG tablet Take 1,000 mg by mouth every 6 (six) hours as needed. For pain       . albuterol (PROVENTIL HFA;VENTOLIN HFA) 108 (90 BASE) MCG/ACT inhaler Inhale 2 puffs into the lungs every 6 (six) hours as needed. For shortness of breath       . aspirin 81 MG chewable tablet Chew 81 mg by mouth daily.      . cyclobenzaprine (FLEXERIL) 10 MG tablet Take 10 mg by mouth 2 (two) times daily.        Marland Kitchen dextromethorphan-guaiFENesin (MUCINEX DM) 30-600 MG per 12 hr tablet Take 1 tablet by mouth every 12 (twelve) hours as needed. For congestion      . Fluticasone-Salmeterol (ADVAIR) 250-50 MCG/DOSE AEPB Inhale 1 puff into the lungs every 12 (twelve) hours.        Marland Kitchen HYDROcodone-acetaminophen (NORCO) 5-325 MG per tablet Take 1 tablet by mouth every 6 (six) hours as needed. For shingles pain      . lisinopril-hydrochlorothiazide (PRINZIDE,ZESTORETIC) 20-12.5 MG per tablet Take 0.5 tablets by mouth daily.        . Milnacipran (SAVELLA) 50 MG TABS Take 50 mg by mouth 2 (two) times daily.        . tadalafil (CIALIS) 5 MG tablet Take 5 mg by mouth daily.        Marland Kitchen tiotropium (SPIRIVA) 18 MCG inhalation capsule Place 18 mcg into inhaler and inhale daily.             [id number] from the symptoms mentioned above,there are no other symptoms referable to all systems reviewed.  Physical Exam: Blood pressure 119/66, pulse 99, temperature 98.5 F (36.9 C), temperature source Oral, resp. rate 15, height 5\' 10"  (1.778 m), weight 74.8 kg (164 lb 14.5 oz), SpO2  96.00%. He is intubated on the ventilator. His pupils are reactive. Nose and throat clear. His neck is supple with no masses bruits or JVD. His chest shows rhonchi bilaterally. His heart is regular. His abdomen is soft without masses. Bowel sounds present and active. His extremities showed no edema. Central nervous system examination is grossly intact.    Basename 12/07/11 2019  WBC 8.4  NEUTROABS 5.9  HGB 13.4  HCT 41.7  MCV 94.3  PLT 301    Basename 12/07/11 2019  NA 134*  K 5.2*  CL 95*  CO2 31  GLUCOSE 139*  BUN 62*  CREATININE 2.32*  CALCIUM 9.3  MG --  lablast2(ast:2,ALT:2,alkphos:2,bilitot:2,prot:2,albumin:2)@    Recent Results (from the past 240 hour(s))  MRSA PCR SCREENING     Status: Normal   Collection Time   12/07/11 10:37 PM      Component Value Range Status Comment   MRSA by PCR NEGATIVE  NEGATIVE  Final      Dg Chest 1 View  12/09/2011  *RADIOLOGY REPORT*  Clinical Data: Evaluate respiratory status.  Chest congestion.  CHEST - 1 VIEW  Comparison: Chest x-ray 12/08/2011.  Findings: Tip of endotracheal tube is unchanged in position approximately 5.5 cm above the carina. A nasogastric tube is seen extending  into the stomach, however, the tip of the nasogastric tube extends below the lower margin of the image.  Lung volumes are normal.  There is increasing interstitial prominence and hazy air space opacity in the right infrahilar region.  No obscuration of the right heart border or the right hemidiaphragm.  No other definite focal airspace consolidation.  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  No definite pleural effusions (left costophrenic sulcus is excluded from the margin of the image).  No signs of edema.  Heart size is normal.  The mediastinal contours are unremarkable.  Atherosclerotic calcifications within the arch of the aorta.  Surgical clips in the lower right cervical region, suggesting prior thyroid surgery.  IMPRESSION: 1.   Support apparatus, as above. 2.  Increasing interstitial and ill-defined airspace opacity in the right infrahilar region concerning for a developing area of consolidation in the right lower lobe posteriorly.  This may be related to infection or may be sequelae of aspiration. 3.  Atherosclerosis.  Original Report Authenticated By: Florencia Reasons, M.D.   Dg Chest Port 1 View  12/08/2011  *RADIOLOGY REPORT*  Clinical Data: Intubation  PORTABLE CHEST - 1 VIEW  Comparison: 12/07/2011  Findings: Interval placement of endotracheal tube with tip about 5.5 cm above the carina.  Normal heart size and pulmonary vascularity.  Emphysematous changes and fibrosis in the lungs.  No focal airspace consolidation.  No pneumothorax.  No blunting of left costophrenic angle.  Right costophrenic angle is not included within the field of view.  IMPRESSION: The endotracheal tube placed with tip about 5.5 cm above the carina.  No other significant change since previous study.  Original Report Authenticated By: Marlon Pel, M.D.   Dg Chest Portable 1 View  12/07/2011  *RADIOLOGY REPORT*  Clinical Data: SOB.  Cough and wheezing.  History of COPD.  PORTABLE CHEST - 1 VIEW  Comparison: 09/17/2011  Findings: Heart size and mediastinal contours appear normal.  There is enlargement of bilateral pulmonary arteries suggesting PA hypertension.  Lungs are hyperinflated and there are coarsened interstitial markings compatible with advanced COPD.  No superimposed airspace consolidation.  IMPRESSION:  1.  No acute cardiopulmonary abnormalities. 2.  COPD. 3.  Suspect PA hypertension.  Original Report Authenticated By: Rosealee Albee, M.D.   Impression: He has acute on chronic respiratory failure. He does not have pneumonia. He has severe COPD. He has shingles and I think the pain from the shingles influenced his difficulty with breathing. He has a history of fibromyalgia. He has hypertension. Active Problems:  * No active hospital  problems. *      Plan: He will remain intubated on the ventilator and see if we can get some clearing of his respiratory failure.      Takita Riecke L Pager [phone]  12/09/2011, 9:11 AM

## 2011-12-09 NOTE — Progress Notes (Signed)
WAKE UP ASSESSMENT:SEDATION HAS BEEN OFF FOR 1 HOUR. PT ABLE TO FOLLOW COMMANDS. PT ABLE TO MOVE ALL FOUR EXTREMITIES. EXPRESS DESIRE FOR ETT TO BE REMOVED.  FEW SPONTANEOUS RESPIRATION.  PT EVENTUALLY BECAME AGITATED AND SEDATION RESTARTED.

## 2011-12-10 ENCOUNTER — Inpatient Hospital Stay (HOSPITAL_COMMUNITY): Payer: Medicaid Other

## 2011-12-10 DIAGNOSIS — J9620 Acute and chronic respiratory failure, unspecified whether with hypoxia or hypercapnia: Secondary | ICD-10-CM | POA: Diagnosis present

## 2011-12-10 DIAGNOSIS — B029 Zoster without complications: Secondary | ICD-10-CM | POA: Diagnosis present

## 2011-12-10 LAB — BLOOD GAS, ARTERIAL
Acid-Base Excess: 2.1 mmol/L — ABNORMAL HIGH (ref 0.0–2.0)
Acid-Base Excess: 4.3 mmol/L — ABNORMAL HIGH (ref 0.0–2.0)
Drawn by: 23534
FIO2: 0.4 %
FIO2: 0.35 %
MECHVT: 600 mL
Mode: POSITIVE
O2 Saturation: 91.4 %
PEEP: 5 cmH2O
PEEP: 5 cmH2O
Patient temperature: 37
RATE: 14 resp/min
TCO2: 24.9 mmol/L (ref 0–100)
TCO2: 26.4 mmol/L (ref 0–100)
pCO2 arterial: 53 mmHg — ABNORMAL HIGH (ref 35.0–45.0)
pCO2 arterial: 50.4 mmHg — ABNORMAL HIGH (ref 35.0–45.0)
pH, Arterial: 7.423 (ref 7.350–7.450)
pO2, Arterial: 85.3 mmHg (ref 80.0–100.0)
pO2, Arterial: 73.5 mmHg — ABNORMAL LOW (ref 80.0–100.0)

## 2011-12-10 LAB — BASIC METABOLIC PANEL
BUN: 41 mg/dL — ABNORMAL HIGH (ref 6–23)
Chloride: 108 mEq/L (ref 96–112)
GFR calc Af Amer: 82 mL/min — ABNORMAL LOW (ref >90)
Glucose, Bld: 183 mg/dL — ABNORMAL HIGH (ref 70–99)
Potassium: 4.2 mEq/L (ref 3.5–5.1)

## 2011-12-10 LAB — DIFFERENTIAL
Basophils Relative: 0 % (ref 0–1)
Lymphocytes Relative: 4 % — ABNORMAL LOW (ref 12–46)
Lymphs Abs: 0.4 10*3/uL — ABNORMAL LOW (ref 0.7–4.0)
Monocytes Absolute: 0.4 10*3/uL (ref 0.1–1.0)
Monocytes Relative: 3 % (ref 3–12)
Neutro Abs: 10.5 10*3/uL — ABNORMAL HIGH (ref 1.7–7.7)

## 2011-12-10 LAB — GLUCOSE, CAPILLARY
Glucose-Capillary: 152 mg/dL — ABNORMAL HIGH (ref 70–99)
Glucose-Capillary: 153 mg/dL — ABNORMAL HIGH (ref 70–99)
Glucose-Capillary: 140 mg/dL — ABNORMAL HIGH (ref 70–99)

## 2011-12-10 LAB — CBC
HCT: 35.5 % — ABNORMAL LOW (ref 39.0–52.0)
Hemoglobin: 11.6 g/dL — ABNORMAL LOW (ref 13.0–17.0)
MCHC: 32.7 g/dL (ref 30.0–36.0)
RBC: 3.92 MIL/uL — ABNORMAL LOW (ref 4.22–5.81)

## 2011-12-10 MED ORDER — HYDROCODONE-ACETAMINOPHEN 5-325 MG PO TABS
1.0000 | ORAL_TABLET | Freq: Four times a day (QID) | ORAL | Status: DC | PRN
  Administered 2011-12-12 – 2011-12-13 (×2): 1 via ORAL
  Filled 2011-12-10 (×2): qty 1

## 2011-12-10 MED ORDER — MIDAZOLAM HCL 50 MG/10ML IJ SOLN
INTRAMUSCULAR | Status: AC
  Filled 2011-12-10: qty 1

## 2011-12-10 MED ORDER — LORAZEPAM 0.5 MG PO TABS
0.5000 mg | ORAL_TABLET | ORAL | Status: DC | PRN
  Administered 2011-12-13: 0.5 mg via ORAL
  Filled 2011-12-10: qty 1

## 2011-12-10 NOTE — Plan of Care (Signed)
Problem: Phase I Progression Outcomes Goal: VTE prophylaxis Outcome: Completed/Met Date Met:  12/10/11 lovenox for DVT Goal: GIProphysixis Outcome: Completed/Met Date Met:  12/10/11 Protonix for GI prophylaxis Goal: Oral Care per Protocol Outcome: Completed/Met Date Met:  12/10/11 Q4 hr mouth care protocol being met Goal: Sedation Protocol initiated if indicated Outcome: Completed/Met Date Met:  12/10/11 Sedation attained using fentanyl and versed

## 2011-12-10 NOTE — Progress Notes (Signed)
BIPAP not needed at this time 

## 2011-12-10 NOTE — Progress Notes (Signed)
Subjective: He is having a wake up assessment now and is awake and able to gesture the like to have the endotracheal tube out.  no new problems have been noted. He is intubated and has been sedated.  Objective: Vital signs in last 24 hours: Temp:  [96.9 F (36.1 C)-98.5 F (36.9 C)] 98.1 F (36.7 C) (04/01 0400) Pulse Rate:  [77-102] 87  (04/01 0645) Resp:  [13-21] 14  (04/01 0645) BP: (92-119)/(55-77) 112/74 mmHg (04/01 0645) SpO2:  [93 %-99 %] 97 % (04/01 0645) FiO2 (%):  [39.5 %-41.4 %] 40.2 % (04/01 0645) Weight:  [77.8 kg (171 lb 8.3 oz)] 77.8 kg (171 lb 8.3 oz) (04/01 0500) Weight change: 3 kg (6 lb 9.8 oz) Last BM Date: 12/05/11  Intake/Output from previous day: 03/31 0701 - 04/01 0700 In: 6112.3 [I.V.:5352.3; NG/GT:510; IV Piggyback:250] Out: 1100 [Urine:1100]  PHYSICAL EXAM General appearance: alert and mild distress Resp: rhonchi bilaterally Cardio: regular rate and rhythm, S1, S2 normal, no murmur, click, rub or gallop GI: soft, non-tender; bowel sounds normal; no masses,  no organomegaly Extremities: extremities normal, atraumatic, no cyanosis or edema  changes of shingles on the left flank  Lab Results:    Basic Metabolic Panel:  Basename 12/10/11 0507 12/09/11 1043  NA 140 140  K 4.2 4.3  CL 108 106  CO2 28 27  GLUCOSE 183* 157*  BUN 41* 45*  CREATININE 1.11 1.23  CALCIUM 8.3* 8.5  MG -- --  PHOS -- --   Liver Function Tests: No results found for this basename: AST:2,ALT:2,ALKPHOS:2,BILITOT:2,PROT:2,ALBUMIN:2 in the last 72 hours No results found for this basename: LIPASE:2,AMYLASE:2 in the last 72 hours No results found for this basename: AMMONIA:2 in the last 72 hours CBC:  Basename 12/10/11 0507 12/09/11 1043  WBC 11.2* 12.4*  NEUTROABS 10.5* 11.4*  HGB 11.6* 11.7*  HCT 35.5* 35.4*  MCV 90.6 91.2  PLT 325 314   Cardiac Enzymes: No results found for this basename: CKTOTAL:3,CKMB:3,CKMBINDEX:3,TROPONINI:3 in the last 72 hours BNP: No  results found for this basename: PROBNP:3 in the last 72 hours D-Dimer: No results found for this basename: DDIMER:2 in the last 72 hours CBG: No results found for this basename: GLUCAP:6 in the last 72 hours Hemoglobin A1C: No results found for this basename: HGBA1C in the last 72 hours Fasting Lipid Panel: No results found for this basename: CHOL,HDL,LDLCALC,TRIG,CHOLHDL,LDLDIRECT in the last 72 hours Thyroid Function Tests: No results found for this basename: TSH,T4TOTAL,FREET4,T3FREE,THYROIDAB in the last 72 hours Anemia Panel: No results found for this basename: VITAMINB12,FOLATE,FERRITIN,TIBC,IRON,RETICCTPCT in the last 72 hours Coagulation: No results found for this basename: LABPROT:2,INR:2 in the last 72 hours Urine Drug Screen: Drugs of Abuse  No results found for this basename: labopia, cocainscrnur, labbenz, amphetmu, thcu, labbarb    Alcohol Level: No results found for this basename: ETH:2 in the last 72 hours Urinalysis: No results found for this basename: COLORURINE:2,APPERANCEUR:2,LABSPEC:2,PHURINE:2,GLUCOSEU:2,HGBUR:2,BILIRUBINUR:2,KETONESUR:2,PROTEINUR:2,UROBILINOGEN:2,NITRITE:2,LEUKOCYTESUR:2 in the last 72 hours Misc. Labs:  ABGS  Basename 12/10/11 0541  PHART 7.423  PO2ART 59.1*  TCO2 27.7  HCO3 27.7*   CULTURES Recent Results (from the past 240 hour(s))  MRSA PCR SCREENING     Status: Normal   Collection Time   12/07/11 10:37 PM      Component Value Range Status Comment   MRSA by PCR NEGATIVE  NEGATIVE  Final    Studies/Results: Dg Chest 1 View  12/09/2011  *RADIOLOGY REPORT*  Clinical Data: Evaluate respiratory status.  Chest congestion.  CHEST - 1 VIEW  Comparison: Chest x-ray 12/08/2011.  Findings: Tip of endotracheal tube is unchanged in position approximately 5.5 cm above the carina. A nasogastric tube is seen extending into the stomach, however, the tip of the nasogastric tube extends below the lower margin of the image.  Lung volumes are normal.   There is increasing interstitial prominence and hazy air space opacity in the right infrahilar region.  No obscuration of the right heart border or the right hemidiaphragm.  No other definite focal airspace consolidation.  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  No definite pleural effusions (left costophrenic sulcus is excluded from the margin of the image).  No signs of edema.  Heart size is normal.  The mediastinal contours are unremarkable.  Atherosclerotic calcifications within the arch of the aorta.  Surgical clips in the lower right cervical region, suggesting prior thyroid surgery.  IMPRESSION: 1.  Support apparatus, as above. 2.  Increasing interstitial and ill-defined airspace opacity in the right infrahilar region concerning for a developing area of consolidation in the right lower lobe posteriorly.  This may be related to infection or may be sequelae of aspiration. 3.  Atherosclerosis.  Original Report Authenticated By: Florencia Reasons, M.D.    Medications:  Prior to Admission:  Prescriptions prior to admission  Medication Sig Dispense Refill  . acetaminophen (TYLENOL) 500 MG tablet Take 1,000 mg by mouth every 6 (six) hours as needed. For pain       . acyclovir (ZOVIRAX) 800 MG tablet Take 800 mg by mouth 5 (five) times daily.      Marland Kitchen albuterol (PROVENTIL HFA;VENTOLIN HFA) 108 (90 BASE) MCG/ACT inhaler Inhale 2 puffs into the lungs every 6 (six) hours as needed. For shortness of breath       . aspirin 81 MG chewable tablet Chew 81 mg by mouth daily.      . cyclobenzaprine (FLEXERIL) 10 MG tablet Take 10 mg by mouth 2 (two) times daily.        Marland Kitchen dextromethorphan-guaiFENesin (MUCINEX DM) 30-600 MG per 12 hr tablet Take 1 tablet by mouth every 12 (twelve) hours as needed. For congestion      . Fluticasone-Salmeterol (ADVAIR) 250-50 MCG/DOSE AEPB Inhale 1 puff into the lungs every 12 (twelve) hours.        Marland Kitchen HYDROcodone-acetaminophen (NORCO) 5-325 MG per tablet  Take 1 tablet by mouth every 6 (six) hours as needed. For shingles pain      . lisinopril-hydrochlorothiazide (PRINZIDE,ZESTORETIC) 20-12.5 MG per tablet Take 0.5 tablets by mouth daily.        . Milnacipran (SAVELLA) 50 MG TABS Take 50 mg by mouth 2 (two) times daily.        . tadalafil (CIALIS) 5 MG tablet Take 5 mg by mouth daily.        Marland Kitchen tiotropium (SPIRIVA) 18 MCG inhalation capsule Place 18 mcg into inhaler and inhale daily.         Scheduled:   . acyclovir  800 mg Oral 5 X Daily  . ipratropium  0.5 mg Nebulization Q4H   And  . albuterol  2.5 mg Nebulization Q4H  . antiseptic oral rinse  15 mL Mouth Rinse QID  . aspirin  81 mg Oral Daily  . chlorhexidine  15 mL Mouth Rinse BID  . cyclobenzaprine  10 mg Oral BID  . enoxaparin (LOVENOX) injection  40 mg Subcutaneous Q24H  . LORazepam  4 mg Intravenous Once  . methylPREDNISolone (SOLU-MEDROL) injection  125 mg Intravenous  Q6H  . midazolam  2-4 mg Intravenous Once  . moxifloxacin  400 mg Intravenous Q24H  . pantoprazole (PROTONIX) IV  40 mg Intravenous Q24H   Continuous:   . sodium chloride 150 mL/hr at 12/10/11 0654  . feeding supplement (OSMOLITE 1.2 CAL) 1,000 mL (12/09/11 1159)  . fentaNYL infusion INTRAVENOUS 25 mcg/hr (12/10/11 0732)  . midazolam (VERSED) infusion 2 mg/hr (12/10/11 0732)  . norepinephrine (LEVOPHED) Adult infusion Stopped (12/09/11 0400)  . DISCONTD: sodium chloride 50 mL/hr at 12/08/11 1800   [id number], magnesium hydroxide, midazolam  Assesment: He has respiratory failure. He has severe COPD. He appeared to be developing a pneumonia. He has shingles. His blood sugar has been up some since he started on tube feedings. His PO2 is somewhat marginal come off of the ventilator but his O2 sats in the high 90s Active Problems:  * No active hospital problems. *     Plan: I want to hold off on starting him on sliding scale insulin because I think he may be able to come off the ventilator this morning. If he  does I will discontinue tube feedings. If not he will need sliding scale insulin.    LOS: 3 days   Alliene Klugh L 12/10/2011, 7:36 AM

## 2011-12-10 NOTE — Progress Notes (Signed)
INITIAL ADULT NUTRITION ASSESSMENT Date: 12/10/2011   Time: 1:24 PM Reason for Assessment: TF  ASSESSMENT: Male 61 y.o.  Dx: Acute-on-chronic respiratory failure   Past Medical History  Diagnosis Date  . COPD (chronic obstructive pulmonary disease)   . Hypertension   . Fibromyalgia   . Shingles   . Cancer     Scheduled Meds:   . acyclovir  800 mg Oral 5 X Daily  . ipratropium  0.5 mg Nebulization Q4H   And  . albuterol  2.5 mg Nebulization Q4H  . antiseptic oral rinse  15 mL Mouth Rinse QID  . aspirin  81 mg Oral Daily  . chlorhexidine  15 mL Mouth Rinse BID  . cyclobenzaprine  10 mg Oral BID  . enoxaparin (LOVENOX) injection  40 mg Subcutaneous Q24H  . LORazepam  4 mg Intravenous Once  . methylPREDNISolone (SOLU-MEDROL) injection  125 mg Intravenous Q6H  . midazolam  2-4 mg Intravenous Once  . moxifloxacin  400 mg Intravenous Q24H  . pantoprazole (PROTONIX) IV  40 mg Intravenous Q24H   Continuous Infusions:   . sodium chloride 75 mL/hr at 12/10/11 0850  . feeding supplement (OSMOLITE 1.2 CAL) 1,000 mL (12/09/11 1159)  . fentaNYL infusion INTRAVENOUS 50 mcg/hr (12/10/11 0750)  . midazolam (VERSED) infusion 3 mg/hr (12/10/11 1322)  . norepinephrine (LEVOPHED) Adult infusion Stopped (12/09/11 0400)  . DISCONTD: sodium chloride 50 mL/hr at 12/08/11 1800   PRN Meds:.fentaNYL, magnesium hydroxide, midazolam  Ht: 5\' 10"  (177.8 cm)  Wt: 171 lb 8.3 oz (77.8 kg)  Ideal Wt:  73 kg % Ideal Wt: 106%  Usual Wt: unknown at this time   Body mass index is 24.61 kg/(m^2). Normal range  Food/Nutrition Related Hx: Pt intubated and sedated. Possible weaning today per MD and RN. He is receiving continuous TF via OGT of Osmolite 1.2 @30  ml/hr tol per RN. TF  providing 864 kcal, 40 gr protein and 590 ml water per day. Currently TF is meeting 47% energy, 34% protein minimum est nutritional needs. Severe weight gain since admission and Hyperglycemia noted.   CMP     Component  Value Date/Time   NA 140 12/10/2011 0507   K 4.2 12/10/2011 0507   CL 108 12/10/2011 0507   CO2 28 12/10/2011 0507   GLUCOSE 183* 12/10/2011 0507   BUN 41* 12/10/2011 0507   CREATININE 1.11 12/10/2011 0507   CALCIUM 8.3* 12/10/2011 0507   GFRNONAA 70* 12/10/2011 0507   GFRAA 82* 12/10/2011 0507     Intake/Output Summary (Last 24 hours) at 12/10/11 1332 Last data filed at 12/10/11 1200  Gross per 24 hour  Intake 3677.17 ml  Output   1100 ml  Net 2577.17 ml   CBG (last 3)   Basename 12/10/11 1114 12/10/11 0735  GLUCAP 153* 152*    Diet Order: NPO  Supplements/Tube Feeding: noted above  IVF:    sodium chloride Last Rate: 75 mL/hr at 12/10/11 0850  feeding supplement (OSMOLITE 1.2 CAL) Last Rate: 1,000 mL (12/09/11 1159)  fentaNYL infusion INTRAVENOUS Last Rate: 50 mcg/hr (12/10/11 0750)  midazolam (VERSED) infusion Last Rate: 3 mg/hr (12/10/11 1322)  norepinephrine (LEVOPHED) Adult infusion Last Rate: Stopped (12/09/11 0400)  DISCONTD: sodium chloride Last Rate: 50 mL/hr at 12/08/11 1800    Estimated Nutritional Needs:   Kcal:1831-2014 kcal per day Protein:117-133 gr per day Fluid:1.8-2.0 Liters per day  NUTRITION DIAGNOSIS: -Inadequate oral intake (NI-2.1).  Status: Ongoing  RELATED TO: inability to eat  AS EVIDENCE BY: NPO status,  mechanical ventilation  MONITORING/EVALUATION(Goals): Monitor: vent status, diet advancement, po intake, labs, I/O and wt changes GOC: Pt will successfully transition to meet >80% of est nutritional needs orally   EDUCATION NEEDS: -Education not appropriate at this time  INTERVENTION: -If unable to wean successfully today and advance diet; rec increase TF 10 ml q 4h  to goal rate of 60 ml/hr and add ProStat 30 ml TID via OGT to provide: 1944 kcal, 125 gr protein, 1180 ml water per day. 100% of est energy and protein will be met at goal rate.   Dietitian [phone]  DOCUMENTATION CODES Per approved criteria  -Not Applicable    Francene Boyers 12/10/2011, 1:24 PM

## 2011-12-11 ENCOUNTER — Inpatient Hospital Stay (HOSPITAL_COMMUNITY): Payer: Medicaid Other

## 2011-12-11 LAB — BASIC METABOLIC PANEL
Chloride: 106 mEq/L (ref 96–112)
Creatinine, Ser: 1.15 mg/dL (ref 0.50–1.35)
GFR calc Af Amer: 78 mL/min — ABNORMAL LOW (ref >90)
GFR calc non Af Amer: 67 mL/min — ABNORMAL LOW (ref >90)
Potassium: 4.9 mEq/L (ref 3.5–5.1)

## 2011-12-11 LAB — DIFFERENTIAL
Lymphocytes Relative: 5 % — ABNORMAL LOW (ref 12–46)
Lymphs Abs: 0.6 10*3/uL — ABNORMAL LOW (ref 0.7–4.0)
Monocytes Relative: 3 % (ref 3–12)
Neutro Abs: 11.6 10*3/uL — ABNORMAL HIGH (ref 1.7–7.7)
Neutrophils Relative %: 93 % — ABNORMAL HIGH (ref 43–77)

## 2011-12-11 LAB — BLOOD GAS, ARTERIAL
Bicarbonate: 29.1 mEq/L — ABNORMAL HIGH (ref 20.0–24.0)
Drawn by: 21310
pH, Arterial: 7.337 — ABNORMAL LOW (ref 7.350–7.450)
pO2, Arterial: 99.9 mmHg (ref 80.0–100.0)

## 2011-12-11 LAB — CBC
Hemoglobin: 12.2 g/dL — ABNORMAL LOW (ref 13.0–17.0)
RBC: 4.1 MIL/uL — ABNORMAL LOW (ref 4.22–5.81)
WBC: 12.5 10*3/uL — ABNORMAL HIGH (ref 4.0–10.5)

## 2011-12-11 MED ORDER — GUAIFENESIN ER 600 MG PO TB12
1200.0000 mg | ORAL_TABLET | Freq: Two times a day (BID) | ORAL | Status: DC
  Administered 2011-12-11 – 2011-12-14 (×7): 1200 mg via ORAL
  Filled 2011-12-11 (×7): qty 2

## 2011-12-11 MED ORDER — FLUTICASONE-SALMETEROL 250-50 MCG/DOSE IN AEPB
INHALATION_SPRAY | RESPIRATORY_TRACT | Status: AC
  Filled 2011-12-11: qty 14

## 2011-12-11 MED ORDER — PANTOPRAZOLE SODIUM 40 MG PO TBEC
40.0000 mg | DELAYED_RELEASE_TABLET | Freq: Every day | ORAL | Status: DC
  Administered 2011-12-12 – 2011-12-13 (×2): 40 mg via ORAL
  Filled 2011-12-11 (×2): qty 1

## 2011-12-11 MED ORDER — FLUTICASONE-SALMETEROL 250-50 MCG/DOSE IN AEPB
1.0000 | INHALATION_SPRAY | Freq: Two times a day (BID) | RESPIRATORY_TRACT | Status: DC
  Administered 2011-12-11 – 2011-12-14 (×6): 1 via RESPIRATORY_TRACT
  Filled 2011-12-11: qty 14

## 2011-12-11 NOTE — Progress Notes (Signed)
Called Connor Armstrong with Infection Prevention and asked about the possibility to discontinue contact precautions since shingles are dried up and crusted over.  Was told that contact precautions could be discontinued.

## 2011-12-11 NOTE — Progress Notes (Signed)
The patient is receiving Protnoix by the intravenous route.  Based on criteria approved by the Pharmacy and Therapeutics Committee and the Medical Executive Committee, the medication is being converted to the equivalent oral dose form.  These criteria include: -No Active GI bleeding -Able to tolerate diet of full liquids (or better) or tube feeding -Able to tolerate other medications by the oral or enteral route  If you have any questions about this conversion, please contact the Pharmacy Department (ext [phone]).  Thank you.  Mady Gemma, [facility] 12/11/2011 9:10 AM

## 2011-12-11 NOTE — Progress Notes (Signed)
   CARE MANAGEMENT NOTE 12/11/2011  Patient:  Connor Armstrong   Account Number:  [id number]  Date Initiated:  12/11/2011  Documentation initiated by:  Sharrie Rothman  Subjective/Objective Assessment:   Pt admitted from home with respiratory failure. Pt lives with wife and granddaughter. Pt has used [facility] in the past and would like to use them again on discharge. Pt already receives oxygen from [facility].     Action/Plan:   CM will arrange HH at discharge. No other CM needs noted. Financial counselor made aware of pt admission.   Anticipated DC Date:  12/18/2011   Anticipated DC Plan:  HOME W HOME HEALTH SERVICES  In-house referral  Financial Counselor      DC Planning Services  CM consult      [facility] Choice  HOME HEALTH   Choice offered to / List presented to:  C-1 Patient        HH arranged  HH-1 RN      [facility] agency  [facility]   Status of service:  In process, will continue to follow Medicare Important Message given?   (If response is "NO", the following Medicare IM given date fields will be blank) Date Medicare IM given:   Date Additional Medicare IM given:    Discharge Disposition:  HOME W HOME HEALTH SERVICES  Per UR Regulation:    If discussed at Long Length of Stay Meetings, dates discussed:    Comments:  12/11/11 1524 [name], RN BSN CM Pt admitted with respiratory failure and on ventilator. Pt has used [facility] in the past and would like to use them again at discharge. Will speak with pt MD and get orders and arrange [facility] with [facility] at d/c. Financial counselor aware that pt is here and will discuss hospital bill with pt. Pt states that he thinks that his Medicaid is in the process of being approved. Pt does receive disability to help with bills and medications. Will continue to follow.

## 2011-12-11 NOTE — Progress Notes (Signed)
Subjective: He says he feels better. He was able to be extubated yesterday. He did have some problems with cough post extubation but that has cleared some. He says he doesn't remember what happened to him so I explained what happens with respiratory failure.  Objective: Vital signs in last 24 hours: Temp:  [97.7 F (36.5 C)-98.8 F (37.1 C)] 97.7 F (36.5 C) (04/02 0400) Pulse Rate:  [75-113] 80  (04/02 0600) Resp:  [16-32] 22  (04/02 0600) BP: (106-153)/(38-109) 141/81 mmHg (04/02 0500) SpO2:  [90 %-100 %] 98 % (04/02 0600) FiO2 (%):  [34.9 %-40 %] 35.8 % (04/01 1458) Weight:  [80.6 kg (177 lb 11.1 oz)] 80.6 kg (177 lb 11.1 oz) (04/02 0500) Weight change: 2.8 kg (6 lb 2.8 oz) Last BM Date: 12/05/11  Intake/Output from previous day: 04/01 0701 - 04/02 0700 In: 3340.6 [P.O.:960; I.V.:2130.6; IV Piggyback:250] Out: 1450 [Urine:1450]  PHYSICAL EXAM General appearance: alert, cooperative and mild distress Resp: rhonchi bilaterally Cardio: regular rate and rhythm, S1, S2 normal, no murmur, click, rub or gallop GI: soft, non-tender; bowel sounds normal; no masses,  no organomegaly Extremities: extremities normal, atraumatic, no cyanosis or edema  Lab Results:    Basic Metabolic Panel:  Basename 12/11/11 0526 12/10/11 0507  NA 141 140  K 4.9 4.2  CL 106 108  CO2 31 28  GLUCOSE 151* 183*  BUN 39* 41*  CREATININE 1.15 1.11  CALCIUM 8.7 8.3*  MG -- --  PHOS -- --   Liver Function Tests: No results found for this basename: AST:2,ALT:2,ALKPHOS:2,BILITOT:2,PROT:2,ALBUMIN:2 in the last 72 hours No results found for this basename: LIPASE:2,AMYLASE:2 in the last 72 hours No results found for this basename: AMMONIA:2 in the last 72 hours CBC:  Basename 12/11/11 0526 12/10/11 0507  WBC 12.5* 11.2*  NEUTROABS 11.6* 10.5*  HGB 12.2* 11.6*  HCT 38.0* 35.5*  MCV 92.7 90.6  PLT 339 325   Cardiac Enzymes: No results found for this basename:  CKTOTAL:3,CKMB:3,CKMBINDEX:3,TROPONINI:3 in the last 72 hours BNP: No results found for this basename: PROBNP:3 in the last 72 hours D-Dimer: No results found for this basename: DDIMER:2 in the last 72 hours CBG:  Basename 12/10/11 1619 12/10/11 1114 12/10/11 0735  GLUCAP 140* 153* 152*   Hemoglobin A1C: No results found for this basename: HGBA1C in the last 72 hours Fasting Lipid Panel: No results found for this basename: CHOL,HDL,LDLCALC,TRIG,CHOLHDL,LDLDIRECT in the last 72 hours Thyroid Function Tests: No results found for this basename: TSH,T4TOTAL,FREET4,T3FREE,THYROIDAB in the last 72 hours Anemia Panel: No results found for this basename: VITAMINB12,FOLATE,FERRITIN,TIBC,IRON,RETICCTPCT in the last 72 hours Coagulation: No results found for this basename: LABPROT:2,INR:2 in the last 72 hours Urine Drug Screen: Drugs of Abuse  No results found for this basename: labopia, cocainscrnur, labbenz, amphetmu, thcu, labbarb    Alcohol Level: No results found for this basename: ETH:2 in the last 72 hours Urinalysis: No results found for this basename: COLORURINE:2,APPERANCEUR:2,LABSPEC:2,PHURINE:2,GLUCOSEU:2,HGBUR:2,BILIRUBINUR:2,KETONESUR:2,PROTEINUR:2,UROBILINOGEN:2,NITRITE:2,LEUKOCYTESUR:2 in the last 72 hours Misc. Labs:  ABGS  Basename 12/11/11 0450  PHART 7.337*  PO2ART 99.9  TCO2 26.5  HCO3 29.1*   CULTURES Recent Results (from the past 240 hour(s))  MRSA PCR SCREENING     Status: Normal   Collection Time   12/07/11 10:37 PM      Component Value Range Status Comment   MRSA by PCR NEGATIVE  NEGATIVE  Final    Studies/Results: Dg Chest 1 View  12/09/2011  *RADIOLOGY REPORT*  Clinical Data: Evaluate respiratory status.  Chest congestion.  CHEST -  1 VIEW  Comparison: Chest x-ray 12/08/2011.  Findings: Tip of endotracheal tube is unchanged in position approximately 5.5 cm above the carina. A nasogastric tube is seen extending into the stomach, however, the tip of the  nasogastric tube extends below the lower margin of the image.  Lung volumes are normal.  There is increasing interstitial prominence and hazy air space opacity in the right infrahilar region.  No obscuration of the right heart border or the right hemidiaphragm.  No other definite focal airspace consolidation.  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  No definite pleural effusions (left costophrenic sulcus is excluded from the margin of the image).  No signs of edema.  Heart size is normal.  The mediastinal contours are unremarkable.  Atherosclerotic calcifications within the arch of the aorta.  Surgical clips in the lower right cervical region, suggesting prior thyroid surgery.  IMPRESSION: 1.  Support apparatus, as above. 2.  Increasing interstitial and ill-defined airspace opacity in the right infrahilar region concerning for a developing area of consolidation in the right lower lobe posteriorly.  This may be related to infection or may be sequelae of aspiration. 3.  Atherosclerosis.  Original Report Authenticated By: Florencia Reasons, M.D.    Medications:  Scheduled:   . acyclovir  800 mg Oral 5 X Daily  . ipratropium  0.5 mg Nebulization Q4H   And  . albuterol  2.5 mg Nebulization Q4H  . antiseptic oral rinse  15 mL Mouth Rinse QID  . aspirin  81 mg Oral Daily  . chlorhexidine  15 mL Mouth Rinse BID  . cyclobenzaprine  10 mg Oral BID  . enoxaparin (LOVENOX) injection  40 mg Subcutaneous Q24H  . LORazepam  4 mg Intravenous Once  . methylPREDNISolone (SOLU-MEDROL) injection  125 mg Intravenous Q6H  . midazolam  2-4 mg Intravenous Once  . moxifloxacin  400 mg Intravenous Q24H  . pantoprazole (PROTONIX) IV  40 mg Intravenous Q24H   Continuous:   . sodium chloride 75 mL/hr at 12/11/11 0600  . feeding supplement (OSMOLITE 1.2 CAL) 1,000 mL (12/09/11 1159)  . fentaNYL infusion INTRAVENOUS 50 mcg/hr (12/10/11 0750)  . midazolam (VERSED) infusion Stopped (12/10/11  1440)  . norepinephrine (LEVOPHED) Adult infusion Stopped (12/09/11 0400)  . DISCONTD: sodium chloride 50 mL/hr at 12/08/11 1800   WJX:[id number], HYDROcodone-acetaminophen, LORazepam, magnesium hydroxide, midazolam  Assesment: He has acute on chronic respiratory failure and had been intubated on the ventilator. He has been extubated and has done well. He still has significant amount of secretions. Principal Problem:  *Acute-on-chronic respiratory failure Active Problems:  Acute exacerbation of chronic obstructive pulmonary disease (COPD)  Hypertension  Fibromyalgia  Shingles    Plan: Continue with current treatments I am going to leave him in the ICU today try to get him up on going to see if we can do something to help break up his secretions.    LOS: 4 days   Abrar Koone L 12/11/2011, 7:23 AM

## 2011-12-11 NOTE — Progress Notes (Signed)
No BIPAP needed at this time pt in no distress

## 2011-12-11 NOTE — Progress Notes (Signed)
Patient alert and oriented x 4. Is cooperative with care.  Lung sounds are congested in all fields.  Cough is productive. Patient is using Flutter Valve PRN with cough/deep breath exercises.  Has requested a snack of milk and crackers.  States he is "feeling much better".  Will continue to monitor throughout shift.

## 2011-12-11 NOTE — Progress Notes (Signed)
Patient has void post foley removal of of yellow urine.  No acute distress noted.  Sitting up on side of bed, using flutter valve and doing coughing and deep breathing exercises.

## 2011-12-11 NOTE — Progress Notes (Signed)
Utilization review completed.  

## 2011-12-12 MED ORDER — ROFLUMILAST 500 MCG PO TABS
500.0000 ug | ORAL_TABLET | Freq: Every day | ORAL | Status: DC
  Administered 2011-12-12 – 2011-12-14 (×3): 500 ug via ORAL
  Filled 2011-12-12 (×6): qty 1

## 2011-12-12 MED ORDER — SODIUM CHLORIDE 0.9 % IJ SOLN
INTRAMUSCULAR | Status: AC
  Administered 2011-12-12: 3 mL
  Filled 2011-12-12: qty 3

## 2011-12-12 NOTE — Progress Notes (Signed)
Subjective: He says he feels better. He has no new complaints. He is still coughing and short of breath but much improved  Objective: Vital signs in last 24 hours: Temp:  [97.3 F (36.3 C)-98.6 F (37 C)] 97.5 F (36.4 C) (04/03 0400) Pulse Rate:  [77-116] 89  (04/02 1900) Resp:  [21-31] 25  (04/02 1800) BP: (136-163)/(80-116) 139/82 mmHg (04/02 1900) SpO2:  [92 %-98 %] 97 % (04/03 0329) Weight:  [83.5 kg (184 lb 1.4 oz)] 83.5 kg (184 lb 1.4 oz) (04/03 0500) Weight change: 2.9 kg (6 lb 6.3 oz) Last BM Date: 12/05/11  Intake/Output from previous day: 04/02 0701 - 04/03 0700 In: 3285 [P.O.:1560; I.V.:1725] Out: 1350 [Urine:1350]  PHYSICAL EXAM General appearance: alert, cooperative and mild distress Resp: rhonchi bilaterally and wheezes bilaterally Cardio: regular rate and rhythm, S1, S2 normal, no murmur, click, rub or gallop GI: soft, non-tender; bowel sounds normal; no masses,  no organomegaly Extremities: extremities normal, atraumatic, no cyanosis or edema  Lab Results:    Basic Metabolic Panel:  Basename 12/11/11 0526 12/10/11 0507  NA 141 140  K 4.9 4.2  CL 106 108  CO2 31 28  GLUCOSE 151* 183*  BUN 39* 41*  CREATININE 1.15 1.11  CALCIUM 8.7 8.3*  MG -- --  PHOS -- --   Liver Function Tests: No results found for this basename: AST:2,ALT:2,ALKPHOS:2,BILITOT:2,PROT:2,ALBUMIN:2 in the last 72 hours No results found for this basename: LIPASE:2,AMYLASE:2 in the last 72 hours No results found for this basename: AMMONIA:2 in the last 72 hours CBC:  Basename 12/11/11 0526 12/10/11 0507  WBC 12.5* 11.2*  NEUTROABS 11.6* 10.5*  HGB 12.2* 11.6*  HCT 38.0* 35.5*  MCV 92.7 90.6  PLT 339 325   Cardiac Enzymes: No results found for this basename: CKTOTAL:3,CKMB:3,CKMBINDEX:3,TROPONINI:3 in the last 72 hours BNP: No results found for this basename: PROBNP:3 in the last 72 hours D-Dimer: No results found for this basename: DDIMER:2 in the last 72  hours CBG:  Basename 12/10/11 1619 12/10/11 1114 12/10/11 0735  GLUCAP 140* 153* 152*   Hemoglobin A1C: No results found for this basename: HGBA1C in the last 72 hours Fasting Lipid Panel: No results found for this basename: CHOL,HDL,LDLCALC,TRIG,CHOLHDL,LDLDIRECT in the last 72 hours Thyroid Function Tests: No results found for this basename: TSH,T4TOTAL,FREET4,T3FREE,THYROIDAB in the last 72 hours Anemia Panel: No results found for this basename: VITAMINB12,FOLATE,FERRITIN,TIBC,IRON,RETICCTPCT in the last 72 hours Coagulation: No results found for this basename: LABPROT:2,INR:2 in the last 72 hours Urine Drug Screen: Drugs of Abuse  No results found for this basename: labopia, cocainscrnur, labbenz, amphetmu, thcu, labbarb    Alcohol Level: No results found for this basename: ETH:2 in the last 72 hours Urinalysis: No results found for this basename: COLORURINE:2,APPERANCEUR:2,LABSPEC:2,PHURINE:2,GLUCOSEU:2,HGBUR:2,BILIRUBINUR:2,KETONESUR:2,PROTEINUR:2,UROBILINOGEN:2,NITRITE:2,LEUKOCYTESUR:2 in the last 72 hours Misc. Labs:  ABGS  Basename 12/11/11 0450  PHART 7.337*  PO2ART 99.9  TCO2 26.5  HCO3 29.1*   CULTURES Recent Results (from the past 240 hour(s))  CULTURE, BLOOD (ROUTINE X 2)     Status: Normal (Preliminary result)   Collection Time   12/07/11  8:19 PM      Component Value Range Status Comment   Specimen Description BLOOD RIGHT ARM   Final    Special Requests BOTTLES DRAWN AEROBIC AND ANAEROBIC 6CC   Final    Culture NO GROWTH 4 DAYS   Final    Report Status PENDING   Incomplete   CULTURE, BLOOD (ROUTINE X 2)     Status: Normal (Preliminary result)  Collection Time   12/07/11  8:25 PM      Component Value Range Status Comment   Specimen Description BLOOD ARTERY   Final    Special Requests BOTTLES DRAWN AEROBIC ONLY 6CC   Final    Culture NO GROWTH 4 DAYS   Final    Report Status PENDING   Incomplete   MRSA PCR SCREENING     Status: Normal   Collection  Time   12/07/11 10:37 PM      Component Value Range Status Comment   MRSA by PCR NEGATIVE  NEGATIVE  Final    Studies/Results: Dg Chest Port 1 View  12/11/2011  *RADIOLOGY REPORT*  Clinical Data: Shortness of breath, cough, congestion.  PORTABLE CHEST - 1 VIEW  Comparison: 12/09/2011  Findings: There is hyperinflation of the lungs compatible with COPD.  Chronic areas of scarring in the lungs bilaterally.  No definite acute process.  Chronic peribronchial thickening.  Heart is normal size.  No effusions.  IMPRESSION: COPD/chronic changes.  No active disease.  Original Report Authenticated By: Cyndie Chime, M.D.    Medications:  Prior to Admission:  Prescriptions prior to admission  Medication Sig Dispense Refill  . acetaminophen (TYLENOL) 500 MG tablet Take 1,000 mg by mouth every 6 (six) hours as needed. For pain       . acyclovir (ZOVIRAX) 800 MG tablet Take 800 mg by mouth 5 (five) times daily.      Marland Kitchen albuterol (PROVENTIL HFA;VENTOLIN HFA) 108 (90 BASE) MCG/ACT inhaler Inhale 2 puffs into the lungs every 6 (six) hours as needed. For shortness of breath       . aspirin 81 MG chewable tablet Chew 81 mg by mouth daily.      . cyclobenzaprine (FLEXERIL) 10 MG tablet Take 10 mg by mouth 2 (two) times daily.        Marland Kitchen dextromethorphan-guaiFENesin (MUCINEX DM) 30-600 MG per 12 hr tablet Take 1 tablet by mouth every 12 (twelve) hours as needed. For congestion      . Fluticasone-Salmeterol (ADVAIR) 250-50 MCG/DOSE AEPB Inhale 1 puff into the lungs every 12 (twelve) hours.        Marland Kitchen HYDROcodone-acetaminophen (NORCO) 5-325 MG per tablet Take 1 tablet by mouth every 6 (six) hours as needed. For shingles pain      . lisinopril-hydrochlorothiazide (PRINZIDE,ZESTORETIC) 20-12.5 MG per tablet Take 0.5 tablets by mouth daily.        . Milnacipran (SAVELLA) 50 MG TABS Take 50 mg by mouth 2 (two) times daily.        . tadalafil (CIALIS) 5 MG tablet Take 5 mg by mouth daily.       Marland Kitchen tiotropium (SPIRIVA) 18 MCG  inhalation capsule Place 18 mcg into inhaler and inhale daily.         Scheduled:   . acyclovir  800 mg Oral 5 X Daily  . ipratropium  0.5 mg Nebulization Q4H   And  . albuterol  2.5 mg Nebulization Q4H  . antiseptic oral rinse  15 mL Mouth Rinse QID  . aspirin  81 mg Oral Daily  . cyclobenzaprine  10 mg Oral BID  . enoxaparin (LOVENOX) injection  40 mg Subcutaneous Q24H  . Fluticasone-Salmeterol  1 puff Inhalation BID  . guaiFENesin  1,200 mg Oral BID  . methylPREDNISolone (SOLU-MEDROL) injection  125 mg Intravenous Q6H  . moxifloxacin  400 mg Intravenous Q24H  . pantoprazole  40 mg Oral Q1200  . DISCONTD: pantoprazole (PROTONIX) IV  40  mg Intravenous Q24H   Continuous:   . sodium chloride 75 mL/hr at 12/12/11 0500   ZOX:[id number], HYDROcodone-acetaminophen, LORazepam, magnesium hydroxide  Assesment: He has acute on chronic respiratory failure that required ventilator support. He is much improved now. He has no new complaints. He is still short of breath but it's not much worse than usual. He has been started back on his regular medications. I think he's okay to transfer out of the ICU. Principal Problem:  *Acute-on-chronic respiratory failure Active Problems:  Acute exacerbation of chronic obstructive pulmonary disease (COPD)  Hypertension  Fibromyalgia  Shingles    Plan: I will move him out of ICU continue with all the other treatments. I will start daliresp.    LOS: 5 days   Bethel Gaglio L 12/12/2011, 7:40 AM

## 2011-12-13 MED ORDER — LISINOPRIL 10 MG PO TABS
20.0000 mg | ORAL_TABLET | Freq: Every day | ORAL | Status: DC
  Administered 2011-12-13 – 2011-12-14 (×2): 20 mg via ORAL
  Filled 2011-12-13 (×2): qty 2

## 2011-12-13 MED ORDER — LISINOPRIL-HYDROCHLOROTHIAZIDE 20-12.5 MG PO TABS: 0.5000 | ORAL_TABLET | Freq: Every day | ORAL | Status: DC

## 2011-12-13 MED ORDER — PREDNISONE 20 MG PO TABS
50.0000 mg | ORAL_TABLET | Freq: Every day | ORAL | Status: DC
  Administered 2011-12-14: 50 mg via ORAL
  Filled 2011-12-13: qty 2

## 2011-12-13 MED ORDER — MILNACIPRAN HCL 50 MG PO TABS
50.0000 mg | ORAL_TABLET | Freq: Two times a day (BID) | ORAL | Status: DC
  Filled 2011-12-13 (×8): qty 1

## 2011-12-13 MED ORDER — BISACODYL 10 MG RE SUPP: 10.0000 mg | Freq: Every day | RECTAL | Status: DC | PRN

## 2011-12-13 MED ORDER — TADALAFIL 5 MG PO TABS
5.0000 mg | ORAL_TABLET | Freq: Every day | ORAL | Status: DC
  Filled 2011-12-13 (×4): qty 1

## 2011-12-13 MED ORDER — MOXIFLOXACIN HCL 400 MG PO TABS
400.0000 mg | ORAL_TABLET | Freq: Every day | ORAL | Status: DC
  Administered 2011-12-13: 400 mg via ORAL
  Filled 2011-12-13: qty 1

## 2011-12-13 MED ORDER — HYDROCHLOROTHIAZIDE 25 MG PO TABS
25.0000 mg | ORAL_TABLET | Freq: Every day | ORAL | Status: DC
  Administered 2011-12-13 – 2011-12-14 (×2): 25 mg via ORAL
  Filled 2011-12-13 (×2): qty 1

## 2011-12-13 MED ORDER — FLEET ENEMA 7-19 GM/118ML RE ENEM: 1.0000 | ENEMA | Freq: Once | RECTAL | Status: DC

## 2011-12-13 MED ORDER — TIOTROPIUM BROMIDE MONOHYDRATE 18 MCG IN CAPS
18.0000 ug | ORAL_CAPSULE | Freq: Every day | RESPIRATORY_TRACT | Status: DC
  Administered 2011-12-13 – 2011-12-14 (×2): 18 ug via RESPIRATORY_TRACT
  Filled 2011-12-13: qty 5

## 2011-12-13 MED ORDER — ALBUTEROL SULFATE (5 MG/ML) 0.5% IN NEBU
2.5000 mg | INHALATION_SOLUTION | Freq: Three times a day (TID) | RESPIRATORY_TRACT | Status: DC
  Administered 2011-12-14: 2.5 mg via RESPIRATORY_TRACT
  Filled 2011-12-13: qty 0.5

## 2011-12-13 NOTE — Progress Notes (Signed)
He is awake and alert says he feels much better. He has no new complaints. He is coughing some. He says he feels like he is moving more air. He is having some problems with constipation.  Exam shows that he is awake and alert and looks comfortable. His chest is much clearer and he is moving air better. His pulse is about 110 blood pressure 144/89 O2 sats in the 90s on 4 L. His chest shows decreased breath sounds but he is moving air better. His heart is regular without gallop. His abdomen is soft. The shingles have pretty much scabbed over  Assessment is that he's has COPD with acute on chronic respiratory failure requiring ventilator support from which he has been weaned. He is improved. He had shingles and that's improved.  Plan is to continue his medications he has orders to transfer from the ICU and I will switch him now to oral antibiotics and

## 2011-12-13 NOTE — Progress Notes (Signed)
Report given to [place], [place]. Pt transferred to dept 300 telemetry bed.

## 2011-12-14 ENCOUNTER — Other Ambulatory Visit: Payer: Self-pay | Admitting: Pulmonary Disease

## 2011-12-14 LAB — CULTURE, BLOOD (ROUTINE X 2)
Culture: NO GROWTH
Culture: NO GROWTH

## 2011-12-14 MED ORDER — ROFLUMILAST 500 MCG PO TABS: 500.0000 ug | ORAL_TABLET | Freq: Every day | ORAL | Status: AC

## 2011-12-14 MED ORDER — AMOXICILLIN-POT CLAVULANATE 875-125 MG PO TABS: 1.0000 | ORAL_TABLET | Freq: Two times a day (BID) | ORAL | Status: AC

## 2011-12-14 MED ORDER — METHYLPREDNISOLONE 4 MG PO KIT: PACK | ORAL | Status: AC

## 2011-12-14 MED ORDER — ALBUTEROL SULFATE (2.5 MG/3ML) 0.083% IN NEBU: 2.5000 mg | INHALATION_SOLUTION | Freq: Four times a day (QID) | RESPIRATORY_TRACT | Status: DC | PRN

## 2011-12-14 MED ORDER — ROFLUMILAST 500 MCG PO TABS
500.0000 ug | ORAL_TABLET | Freq: Every day | ORAL | Status: DC
  Filled 2011-12-14 (×4): qty 1

## 2011-12-14 MED ORDER — BIOTENE DRY MOUTH MT LIQD
15.0000 mL | Freq: Two times a day (BID) | OROMUCOSAL | Status: DC
  Administered 2011-12-14: 15 mL via OROMUCOSAL

## 2011-12-14 NOTE — Progress Notes (Signed)
Patient received discharge instructions along with follow up appointments and prescriptions. Home health set up for patient and appointment with sleep clinic made. Patient verbalized understanding of all instructions. Patient was escorted by staff via wheelchair to vehicle. Patient discharged to home in stable condition.

## 2011-12-14 NOTE — Discharge Instructions (Signed)
[facility] for registered nurse, physical therapy, and respiratory therapy.

## 2011-12-14 NOTE — Progress Notes (Signed)
   CARE MANAGEMENT NOTE 12/14/2011  Patient:  Connor Armstrong, Connor Armstrong   Account Number:  [id number]  Date Initiated:  12/11/2011  Documentation initiated by:  Sharrie Rothman  Subjective/Objective Assessment:   Pt admitted from home with respiratory failure. Pt lives with wife and granddaughter. Pt has used [facility] in the past and would like to use them again on discharge. Pt already receives oxygen from [facility].     Action/Plan:   CM will arrange HH at discharge. No other CM needs noted. Financial counselor made aware of pt admission.   Anticipated DC Date:  12/18/2011   Anticipated DC Plan:  HOME W HOME HEALTH SERVICES  In-house referral  Financial Counselor      DC Planning Services  CM consult      [facility] Choice  HOME HEALTH   Choice offered to / List presented to:  C-1 Patient   DME arranged  NEBULIZER MACHINE      DME agency  [facility]     [facility] arranged  HH-1 RN  HH-10 DISEASE MANAGEMENT  HH-2 PT  HH-7 RESPIRATORY THERAPY      HH agency  [facility]   Status of service:  Completed, signed off Medicare Important Message given?   (If response is "NO", the following Medicare IM given date fields will be blank) Date Medicare IM given:   Date Additional Medicare IM given:    Discharge Disposition:  HOME W HOME HEALTH SERVICES  Per UR Regulation:    If discussed at Long Length of Stay Meetings, dates discussed:   12/13/2011    Comments:  12/14/11 1100 [name], RN BSN CM Pt discharged home with [facility] for RN, PT, RT.  Tula Nakayama of [facility] notified of pt needing neb machine.Nebulizier machine ordered for pt also through [facility]. [facility] will deliver the neb machine to his home. Alroy Bailiff of [facility] will collect information from the pts chart. Pt able to afford his albuterol for nebs, prednisone dose pk, and augmentin. Pt unable to afford daliresp which is $219.13 at [facility]. Pt will contact Sonja at [facility] to get medication  assistance for that medication. Contacted Dr. Juanetta Gosling to see if any other drug could be substituted for it and he said the pt could wait until he contacts the Health Dept to get assistance for it. Pt and pt nurse made aware of discharge arrangements. 12/11/11 1524 [name], RN BSN CM Pt admitted with respiratory failure and on ventilator. Pt has used [facility] in the past and would like to use them again at discharge. Will speak with pt MD and get orders and arrange [facility] with [facility] at d/c. Financial counselor aware that pt is here and will discuss hospital bill with pt. Pt states that he thinks that his Medicaid is in the process of being approved. Pt does receive disability to help with bills and medications. Will continue to follow.

## 2011-12-14 NOTE — Progress Notes (Signed)
Subjective: He says he feels much better and wants to go home. He is coughing some which is mostly nonproductive. He's been able to walk and feels like he's back to baseline  Objective: Vital signs in last 24 hours: Temp:  [97.6 F (36.4 C)-98.5 F (36.9 C)] 97.7 F (36.5 C) (04/05 0524) Pulse Rate:  [81-95] 81  (04/05 0524) Resp:  [18-20] 18  (04/05 0524) BP: (108-145)/(69-77) 108/69 mmHg (04/05 0524) SpO2:  [93 %-97 %] 97 % (04/05 0711) Weight change:  Last BM Date: 12/13/11  Intake/Output from previous day: 04/04 0701 - 04/05 0700 In: 15131.8 [P.O.:240; I.V.:14891.8] Out: 1000 [Urine:1000]  PHYSICAL EXAM General appearance: alert, cooperative and no distress Resp: rhonchi bilaterally Cardio: regular rate and rhythm, S1, S2 normal, no murmur, click, rub or gallop GI: soft, non-tender; bowel sounds normal; no masses,  no organomegaly Extremities: extremities normal, atraumatic, no cyanosis or edema  Lab Results:    Basic Metabolic Panel: No results found for this basename: NA:2,K:2,CL:2,CO2:2,GLUCOSE:2,BUN:2,CREATININE:2,CALCIUM:2,MG:2,PHOS:2 in the last 72 hours Liver Function Tests: No results found for this basename: AST:2,ALT:2,ALKPHOS:2,BILITOT:2,PROT:2,ALBUMIN:2 in the last 72 hours No results found for this basename: LIPASE:2,AMYLASE:2 in the last 72 hours No results found for this basename: AMMONIA:2 in the last 72 hours CBC: No results found for this basename: WBC:2,NEUTROABS:2,HGB:2,HCT:2,MCV:2,PLT:2 in the last 72 hours Cardiac Enzymes: No results found for this basename: CKTOTAL:3,CKMB:3,CKMBINDEX:3,TROPONINI:3 in the last 72 hours BNP: No results found for this basename: PROBNP:3 in the last 72 hours D-Dimer: No results found for this basename: DDIMER:2 in the last 72 hours CBG: No results found for this basename: GLUCAP:6 in the last 72 hours Hemoglobin A1C: No results found for this basename: HGBA1C in the last 72 hours Fasting Lipid Panel: No  results found for this basename: CHOL,HDL,LDLCALC,TRIG,CHOLHDL,LDLDIRECT in the last 72 hours Thyroid Function Tests: No results found for this basename: TSH,T4TOTAL,FREET4,T3FREE,THYROIDAB in the last 72 hours Anemia Panel: No results found for this basename: VITAMINB12,FOLATE,FERRITIN,TIBC,IRON,RETICCTPCT in the last 72 hours Coagulation: No results found for this basename: LABPROT:2,INR:2 in the last 72 hours Urine Drug Screen: Drugs of Abuse  No results found for this basename: labopia, cocainscrnur, labbenz, amphetmu, thcu, labbarb    Alcohol Level: No results found for this basename: ETH:2 in the last 72 hours Urinalysis: No results found for this basename: COLORURINE:2,APPERANCEUR:2,LABSPEC:2,PHURINE:2,GLUCOSEU:2,HGBUR:2,BILIRUBINUR:2,KETONESUR:2,PROTEINUR:2,UROBILINOGEN:2,NITRITE:2,LEUKOCYTESUR:2 in the last 72 hours Misc. Labs:  ABGS No results found for this basename: PHART,PCO2,PO2ART,TCO2,HCO3 in the last 72 hours CULTURES Recent Results (from the past 240 hour(s))  CULTURE, BLOOD (ROUTINE X 2)     Status: Normal   Collection Time   12/07/11  8:19 PM      Component Value Range Status Comment   Specimen Description BLOOD RIGHT ARM   Final    Special Requests BOTTLES DRAWN AEROBIC AND ANAEROBIC 6CC   Final    Culture NO GROWTH 7 DAYS   Final    Report Status 12/14/2011 FINAL   Final   CULTURE, BLOOD (ROUTINE X 2)     Status: Normal   Collection Time   12/07/11  8:25 PM      Component Value Range Status Comment   Specimen Description BLOOD ARTERY   Final    Special Requests BOTTLES DRAWN AEROBIC ONLY 6CC   Final    Culture NO GROWTH 7 DAYS   Final    Report Status 12/14/2011 FINAL   Final   MRSA PCR SCREENING     Status: Normal   Collection Time   12/07/11 10:37 PM  Component Value Range Status Comment   MRSA by PCR NEGATIVE  NEGATIVE  Final    Studies/Results: No results found.  Medications:  Prior to Admission:  Prescriptions prior to admission  Medication  Sig Dispense Refill  . acetaminophen (TYLENOL) 500 MG tablet Take 1,000 mg by mouth every 6 (six) hours as needed. For pain       . acyclovir (ZOVIRAX) 800 MG tablet Take 800 mg by mouth 5 (five) times daily.      Marland Kitchen albuterol (PROVENTIL HFA;VENTOLIN HFA) 108 (90 BASE) MCG/ACT inhaler Inhale 2 puffs into the lungs every 6 (six) hours as needed. For shortness of breath       . aspirin 81 MG chewable tablet Chew 81 mg by mouth daily.      . cyclobenzaprine (FLEXERIL) 10 MG tablet Take 10 mg by mouth 2 (two) times daily.        Marland Kitchen dextromethorphan-guaiFENesin (MUCINEX DM) 30-600 MG per 12 hr tablet Take 1 tablet by mouth every 12 (twelve) hours as needed. For congestion      . Fluticasone-Salmeterol (ADVAIR) 250-50 MCG/DOSE AEPB Inhale 1 puff into the lungs every 12 (twelve) hours.        Marland Kitchen HYDROcodone-acetaminophen (NORCO) 5-325 MG per tablet Take 1 tablet by mouth every 6 (six) hours as needed. For shingles pain      . lisinopril-hydrochlorothiazide (PRINZIDE,ZESTORETIC) 20-12.5 MG per tablet Take 0.5 tablets by mouth daily.        . Milnacipran (SAVELLA) 50 MG TABS Take 50 mg by mouth 2 (two) times daily.        . tadalafil (CIALIS) 5 MG tablet Take 5 mg by mouth daily.       Marland Kitchen tiotropium (SPIRIVA) 18 MCG inhalation capsule Place 18 mcg into inhaler and inhale daily.         Scheduled:   . acyclovir  800 mg Oral 5 X Daily  . albuterol  2.5 mg Nebulization TID  . antiseptic oral rinse  15 mL Mouth Rinse BID  . aspirin  81 mg Oral Daily  . cyclobenzaprine  10 mg Oral BID  . enoxaparin (LOVENOX) injection  40 mg Subcutaneous Q24H  . Fluticasone-Salmeterol  1 puff Inhalation BID  . guaiFENesin  1,200 mg Oral BID  . lisinopril  20 mg Oral Daily   And  . hydrochlorothiazide  25 mg Oral Daily  . Milnacipran  50 mg Oral BID  . moxifloxacin  400 mg Oral q1800  . pantoprazole  40 mg Oral Q1200  . predniSONE  50 mg Oral Q breakfast  . roflumilast  500 mcg Oral Daily  . roflumilast  500 mcg Oral  Daily  . sodium phosphate  1 enema Rectal Once  . tadalafil  5 mg Oral Daily  . tiotropium  18 mcg Inhalation Daily  . DISCONTD: albuterol  2.5 mg Nebulization Q4H  . DISCONTD: antiseptic oral rinse  15 mL Mouth Rinse QID  . DISCONTD: ipratropium  0.5 mg Nebulization Q4H  . DISCONTD: lisinopril-hydrochlorothiazide  0.5 tablet Oral Daily   Continuous:   . sodium chloride 75 mL/hr at 12/14/11 0630   ZOX:WRUEAVWUJ, fentaNYL, HYDROcodone-acetaminophen, LORazepam, magnesium hydroxide  Assesment: He was admitted with acute on chronic respiratory failure requiring ventilator support. He is doing much better and I think she's ready for discharge Principal Problem:  *Acute-on-chronic respiratory failure Active Problems:  Acute exacerbation of chronic obstructive pulmonary disease (COPD)  Hypertension  Fibromyalgia  Shingles    Plan: Discharge home with home  health services    LOS: 7 days   Levaughn Puccinelli L 12/14/2011, 8:50 AM

## 2011-12-15 NOTE — Discharge Summary (Signed)
Physician Discharge Summary  Patient ID: Connor Armstrong MRN: [id number] DOB/AGE: 02/05/1951 61 y.o. Primary Care Physician:No primary provider on file. Admit date: 12/07/2011 Discharge date: 12/15/2011    Discharge Diagnoses:   Principal Problem:  *Acute-on-chronic respiratory failure Active Problems:  Acute exacerbation of chronic obstructive pulmonary disease (COPD)  Hypertension  Fibromyalgia  Shingles   Medication List  As of 12/15/2011 10:40 AM   TAKE these medications         acetaminophen 500 MG tablet   Commonly known as: TYLENOL   Take 1,000 mg by mouth every 6 (six) hours as needed. For pain      acyclovir 800 MG tablet   Commonly known as: ZOVIRAX   Take 800 mg by mouth 5 (five) times daily.      albuterol 108 (90 BASE) MCG/ACT inhaler   Commonly known as: PROVENTIL HFA;VENTOLIN HFA   Inhale 2 puffs into the lungs every 6 (six) hours as needed. For shortness of breath      albuterol (2.5 MG/3ML) 0.083% nebulizer solution   Commonly known as: PROVENTIL   Take 3 mLs (2.5 mg total) by nebulization every 6 (six) hours as needed for wheezing.      amoxicillin-clavulanate 875-125 MG per tablet   Commonly known as: AUGMENTIN   Take 1 tablet by mouth 2 (two) times daily.      aspirin 81 MG chewable tablet   Chew 81 mg by mouth daily.      cyclobenzaprine 10 MG tablet   Commonly known as: FLEXERIL   Take 10 mg by mouth 2 (two) times daily.      dextromethorphan-guaiFENesin 30-600 MG per 12 hr tablet   Commonly known as: MUCINEX DM   Take 1 tablet by mouth every 12 (twelve) hours as needed. For congestion      Fluticasone-Salmeterol 250-50 MCG/DOSE Aepb   Commonly known as: ADVAIR   Inhale 1 puff into the lungs every 12 (twelve) hours.      HYDROcodone-acetaminophen 5-325 MG per tablet   Commonly known as: NORCO   Take 1 tablet by mouth every 6 (six) hours as needed. For shingles pain      lisinopril-hydrochlorothiazide 20-12.5 MG per tablet   Commonly  known as: PRINZIDE,ZESTORETIC   Take 0.5 tablets by mouth daily.      methylPREDNISolone 4 MG tablet   Commonly known as: MEDROL DOSEPAK   follow package directions      Milnacipran 50 MG Tabs   Commonly known as: SAVELLA   Take 50 mg by mouth 2 (two) times daily.      roflumilast 500 MCG Tabs tablet   Commonly known as: DALIRESP   Take 1 tablet (500 mcg total) by mouth daily.      tadalafil 5 MG tablet   Commonly known as: CIALIS   Take 5 mg by mouth daily.      tiotropium 18 MCG inhalation capsule   Commonly known as: SPIRIVA   Place 18 mcg into inhaler and inhale daily.            Discharged Condition: Improved    Consults: None  Significant Diagnostic Studies: Dg Chest 1 View  12/09/2011  *RADIOLOGY REPORT*  Clinical Data: Evaluate respiratory status.  Chest congestion.  CHEST - 1 VIEW  Comparison: Chest x-ray 12/08/2011.  Findings: Tip of endotracheal tube is unchanged in position approximately 5.5 cm above the carina. A nasogastric tube is seen extending into the stomach, however, the tip of the nasogastric tube extends  below the lower margin of the image.  Lung volumes are normal.  There is increasing interstitial prominence and hazy air space opacity in the right infrahilar region.  No obscuration of the right heart border or the right hemidiaphragm.  No other definite focal airspace consolidation.  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  No definite pleural effusions (left costophrenic sulcus is excluded from the margin of the image).  No signs of edema.  Heart size is normal.  The mediastinal contours are unremarkable.  Atherosclerotic calcifications within the arch of the aorta.  Surgical clips in the lower right cervical region, suggesting prior thyroid surgery.  IMPRESSION: 1.  Support apparatus, as above. 2.  Increasing interstitial and ill-defined airspace opacity in the right infrahilar region concerning for a developing area of  consolidation in the right lower lobe posteriorly.  This may be related to infection or may be sequelae of aspiration. 3.  Atherosclerosis.  Original Report Authenticated By: Florencia Armstrong, M.D.   Dg Chest Port 1 View  12/11/2011  *RADIOLOGY REPORT*  Clinical Data: Shortness of breath, cough, congestion.  PORTABLE CHEST - 1 VIEW  Comparison: 12/09/2011  Findings: There is hyperinflation of the lungs compatible with COPD.  Chronic areas of scarring in the lungs bilaterally.  No definite acute process.  Chronic peribronchial thickening.  Heart is normal size.  No effusions.  IMPRESSION: COPD/chronic changes.  No active disease.  Original Report Authenticated By: Cyndie Chime, M.D.   Dg Chest Port 1 View  12/08/2011  *RADIOLOGY REPORT*  Clinical Data: Intubation  PORTABLE CHEST - 1 VIEW  Comparison: 12/07/2011  Findings: Interval placement of endotracheal tube with tip about 5.5 cm above the carina.  Normal heart size and pulmonary vascularity.  Emphysematous changes and fibrosis in the lungs.  No focal airspace consolidation.  No pneumothorax.  No blunting of left costophrenic angle.  Right costophrenic angle is not included within the field of view.  IMPRESSION: The endotracheal tube placed with tip about 5.5 cm above the carina.  No other significant change since previous study.  Original Report Authenticated By: Marlon Pel, M.D.   Dg Chest Portable 1 View  12/07/2011  *RADIOLOGY REPORT*  Clinical Data: SOB.  Cough and wheezing.  History of COPD.  PORTABLE CHEST - 1 VIEW  Comparison: 09/17/2011  Findings: Heart size and mediastinal contours appear normal.  There is enlargement of bilateral pulmonary arteries suggesting PA hypertension.  Lungs are hyperinflated and there are coarsened interstitial markings compatible with advanced COPD.  No superimposed airspace consolidation.  IMPRESSION:  1.  No acute cardiopulmonary abnormalities. 2.  COPD. 3.  Suspect PA hypertension.  Original Report  Authenticated By: Rosealee Albee, M.D.    Lab Results: Basic Metabolic Panel: No results found for this basename: NA:2,K:2,CL:2,CO2:2,GLUCOSE:2,BUN:2,CREATININE:2,CALCIUM:2,MG:2,PHOS:2 in the last 72 hours Liver Function Tests: No results found for this basename: AST:2,ALT:2,ALKPHOS:2,BILITOT:2,PROT:2,ALBUMIN:2 in the last 72 hours   CBC: No results found for this basename: WBC:2,NEUTROABS:2,HGB:2,HCT:2,MCV:2,PLT:2 in the last 72 hours  Recent Results (from the past 240 hour(s))  CULTURE, BLOOD (ROUTINE X 2)     Status: Normal   Collection Time   12/07/11  8:19 PM      Component Value Range Status Comment   Specimen Description BLOOD RIGHT ARM   Final    Special Requests BOTTLES DRAWN AEROBIC AND ANAEROBIC 6CC   Final    Culture NO GROWTH 7 DAYS   Final    Report Status 12/14/2011 FINAL  Final   CULTURE, BLOOD (ROUTINE X 2)     Status: Normal   Collection Time   12/07/11  8:25 PM      Component Value Range Status Comment   Specimen Description BLOOD ARTERY   Final    Special Requests BOTTLES DRAWN AEROBIC ONLY 6CC   Final    Culture NO GROWTH 7 DAYS   Final    Report Status 12/14/2011 FINAL   Final   MRSA PCR SCREENING     Status: Normal   Collection Time   12/07/11 10:37 PM      Component Value Range Status Comment   MRSA by PCR NEGATIVE  NEGATIVE  Final      Hospital Course: He was admitted with COPD exacerbation. On the first day of admission he had increasing respiratory distress which culminated in him being intubated and placed on mechanical ventilation. He remained on the ventilator for about 48 hours and then was able to be extubated relatively easily. He did not have pneumonia. After his extubation he was gradually ambulated, switched over to oral medications and did very well with this. He said he actually felt somewhat better than he did prior to admission.  Discharge Exam: Blood pressure 133/90, pulse 81, temperature 97.7 F (36.5 C), temperature source Oral,  resp. rate 18, height 5\' 10"  (1.778 m), weight 86.3 kg (190 lb 4.1 oz), SpO2 97.00%. He was awake and alert. His chest shows rhonchi bilaterally but that is chronic. His heart is regular. His abdomen is soft. He does not have any edema of the extremities. His central nervous system examination is grossly intact  Disposition: Home with home health services  Discharge Orders    Future Appointments: Provider: Department: Dept Phone: Center:   12/19/2011 8:00 PM Asd-Sleep Study Asd-Sdc [facility] [phone] [facility]     Future Orders Please Complete By Expires   DME Nebulizer machine      Ambulatory referral to Sleep Studies      Comments:   Outpatient sleep study   Home Health      Questions: Responses:   To provide the following care/treatments RN    Respiratory Care    PT   Face-to-face encounter      Comments:   I Staisha Winiarski L certify that this patient is under my care and that I, or a nurse practitioner or physician's assistant working with me, had a face-to-face encounter that meets the physician face-to-face encounter requirements with this patient on 12/14/2011.       Questions: Responses:   The encounter with the patient was in whole, or in part, for the following medical condition, which is the primary reason for home health care respiratory failure   I certify that, based on my findings, the following services are medically necessary home health services Nursing    Physical therapy   My clinical findings support the need for the above services Acute exacerbation of COPD   Further, I certify that my clinical findings support that this patient is homebound (i.e. absences from home require considerable and taxing effort and are for medical Armstrong or religious services or infrequently or of short duration when for other Armstrong) Ambulates short distances less than 300 feet   To provide the following care/treatments RN    Respiratory Care    PT      Follow-up Information     Follow up with [facility] [phone].         Signed: Moriah Shawley L Pager  [phone]  12/15/2011, 10:40 AM

## 2011-12-19 ENCOUNTER — Ambulatory Visit: Payer: Medicaid Other | Attending: Pulmonary Disease

## 2011-12-19 DIAGNOSIS — G4733 Obstructive sleep apnea (adult) (pediatric): Secondary | ICD-10-CM | POA: Insufficient documentation

## 2011-12-23 NOTE — Procedures (Signed)
Connor, Armstrong             ACCOUNT NO.:  [id number]  MEDICAL RECORD NO.:  [id number]          PATIENT TYPE:  OUT  LOCATION:  SLEEP LAB                     FACILITY:  [facility]  PHYSICIAN:  Grayer Sproles A. Gerilyn Pilgrim, M.D. DATE OF BIRTH:  1951-08-15  DATE OF STUDY:  12/19/2011                           NOCTURNAL POLYSOMNOGRAM    REFERRING PHYSICIAN:  Ramon Dredge L. Juanetta Gosling, M.D.  INDICATIONS:  A 61 year old who presents with loud snoring, hypersomnia, and fatigue.  The study is being done to evaluate for obstructive sleep apnea syndrome.   MEDICATIONS:  None listed.  EPWORTH SLEEPINESS SCALE: 1. BMI 23.  ARCHITECTURAL SUMMARY:  The total recording time is 418 minutes.  Sleep efficiency 48%.  Sleep latency 29 minutes.  REM latency 312 minutes. Stage N1 34%, N2 62%, N3 2%, and REM sleep 1.  RESPIRATORY SUMMARY:  Baseline oxygen saturation 91, lowest saturation 84.  Diagnostic AHI is 5 and RDI 5.7.  The patient did have low oxygen saturations while awake and also at sleep.  They occurred for extended periods of time without apneic events suggestive of hypoventilation syndrome.  The patient was placed on 1 L of oxygen.  LIMB MOVEMENT SUMMARY:  PLM index 0.  ELECTROCARDIOGRAM SUMMARY:  Average heart rate is 89 with no significant dysrhythmias observed.  IMPRESSION: 1. Mild obstructive sleep apnea syndrome, not requiring positive     pressure. 2. Hypoventilation syndrome. 3. Abnormal architecture with poor sleep efficiency.  RECOMMENDATIONS:  2 L nocturnal oxygen.  Thanks for this referral.   [name] A. Gerilyn Pilgrim, M.D.    KAD/MEDQ  D:  12/22/2011 21:44:17  T:  12/23/2011 01:55:58  Job:  [id number]

## 2012-06-13 ENCOUNTER — Ambulatory Visit (HOSPITAL_COMMUNITY)
Admission: RE | Admit: 2012-06-13 | Discharge: 2012-06-13 | Disposition: A | Discharge status: Home / Self Care | Payer: Medicare Other | Source: Ambulatory Visit | Attending: Pulmonary Disease | Admitting: Pulmonary Disease

## 2012-06-13 ENCOUNTER — Other Ambulatory Visit (HOSPITAL_COMMUNITY): Payer: Self-pay | Admitting: Pulmonary Disease

## 2012-06-13 DIAGNOSIS — J449 Chronic obstructive pulmonary disease, unspecified: Secondary | ICD-10-CM

## 2012-06-13 DIAGNOSIS — J4489 Other specified chronic obstructive pulmonary disease: Secondary | ICD-10-CM | POA: Insufficient documentation

## 2012-09-18 ENCOUNTER — Inpatient Hospital Stay (HOSPITAL_COMMUNITY): Payer: Medicare Other

## 2012-09-18 ENCOUNTER — Emergency Department (HOSPITAL_COMMUNITY): Payer: Medicare Other

## 2012-09-18 ENCOUNTER — Encounter (HOSPITAL_COMMUNITY): Payer: Self-pay | Admitting: Emergency Medicine

## 2012-09-18 ENCOUNTER — Inpatient Hospital Stay (HOSPITAL_COMMUNITY)
Admission: EM | Admit: 2012-09-18 | Discharge: 2012-10-01 | DRG: 208 | Disposition: A | Discharge status: Home Health | Payer: Medicare Other | Attending: Pulmonary Disease | Admitting: Pulmonary Disease

## 2012-09-18 DIAGNOSIS — Z85528 Personal history of other malignant neoplasm of kidney: Secondary | ICD-10-CM

## 2012-09-18 DIAGNOSIS — J441 Chronic obstructive pulmonary disease with (acute) exacerbation: Secondary | ICD-10-CM | POA: Diagnosis present

## 2012-09-18 DIAGNOSIS — J9620 Acute and chronic respiratory failure, unspecified whether with hypoxia or hypercapnia: Principal | ICD-10-CM | POA: Diagnosis present

## 2012-09-18 DIAGNOSIS — R339 Retention of urine, unspecified: Secondary | ICD-10-CM | POA: Diagnosis not present

## 2012-09-18 DIAGNOSIS — I10 Essential (primary) hypertension: Secondary | ICD-10-CM | POA: Diagnosis present

## 2012-09-18 DIAGNOSIS — G253 Myoclonus: Secondary | ICD-10-CM | POA: Diagnosis present

## 2012-09-18 DIAGNOSIS — IMO0001 Reserved for inherently not codable concepts without codable children: Secondary | ICD-10-CM | POA: Diagnosis present

## 2012-09-18 DIAGNOSIS — Z7982 Long term (current) use of aspirin: Secondary | ICD-10-CM

## 2012-09-18 DIAGNOSIS — R0689 Other abnormalities of breathing: Secondary | ICD-10-CM

## 2012-09-18 DIAGNOSIS — K5900 Constipation, unspecified: Secondary | ICD-10-CM | POA: Diagnosis not present

## 2012-09-18 DIAGNOSIS — T424X5A Adverse effect of benzodiazepines, initial encounter: Secondary | ICD-10-CM | POA: Diagnosis present

## 2012-09-18 DIAGNOSIS — M797 Fibromyalgia: Secondary | ICD-10-CM | POA: Diagnosis present

## 2012-09-18 DIAGNOSIS — Z79899 Other long term (current) drug therapy: Secondary | ICD-10-CM

## 2012-09-18 DIAGNOSIS — N5089 Other specified disorders of the male genital organs: Secondary | ICD-10-CM | POA: Diagnosis not present

## 2012-09-18 DIAGNOSIS — T4275XA Adverse effect of unspecified antiepileptic and sedative-hypnotic drugs, initial encounter: Secondary | ICD-10-CM | POA: Diagnosis present

## 2012-09-18 DIAGNOSIS — G9340 Encephalopathy, unspecified: Secondary | ICD-10-CM | POA: Diagnosis present

## 2012-09-18 DIAGNOSIS — K560 Paralytic ileus: Secondary | ICD-10-CM

## 2012-09-18 DIAGNOSIS — F17200 Nicotine dependence, unspecified, uncomplicated: Secondary | ICD-10-CM | POA: Diagnosis present

## 2012-09-18 LAB — CBC WITH DIFFERENTIAL/PLATELET
Basophils Absolute: 0 10*3/uL (ref 0.0–0.1)
Basophils Relative: 0 % (ref 0–1)
Hemoglobin: 13.1 g/dL (ref 13.0–17.0)
Lymphocytes Relative: 4 % — ABNORMAL LOW (ref 12–46)
MCHC: 29.8 g/dL — ABNORMAL LOW (ref 30.0–36.0)
Monocytes Relative: 5 % (ref 3–12)
Neutro Abs: 8.9 10*3/uL — ABNORMAL HIGH (ref 1.7–7.7)
Neutrophils Relative %: 89 % — ABNORMAL HIGH (ref 43–77)
RDW: 15.1 % (ref 11.5–15.5)
WBC: 10.1 10*3/uL (ref 4.0–10.5)

## 2012-09-18 LAB — BLOOD GAS, ARTERIAL
Delivery systems: POSITIVE
Expiratory PAP: 5
Inspiratory PAP: 10
Mode: POSITIVE
O2 Content: 2 L/min
Patient temperature: 37
TCO2: 41 mmol/L (ref 0–100)
pCO2 arterial: 99.8 mmHg (ref 35.0–45.0)
pCO2 arterial: 84 mmHg (ref 35.0–45.0)
pH, Arterial: 7.29 — ABNORMAL LOW (ref 7.350–7.450)
pH, Arterial: 7.349 — ABNORMAL LOW (ref 7.350–7.450)
pO2, Arterial: 57.8 mmHg — ABNORMAL LOW (ref 80.0–100.0)

## 2012-09-18 LAB — BASIC METABOLIC PANEL
CO2: >45 mEq/L (ref 19–32)
Chloride: 91 mEq/L — ABNORMAL LOW (ref 96–112)
GFR calc Af Amer: 68 mL/min — ABNORMAL LOW (ref >90)
Potassium: 4.9 mEq/L (ref 3.5–5.1)

## 2012-09-18 LAB — INFLUENZA PANEL BY PCR (TYPE A & B)
H1N1 flu by pcr: NOT DETECTED
Influenza B By PCR: NEGATIVE

## 2012-09-18 MED ORDER — ASPIRIN 81 MG PO CHEW
81.0000 mg | CHEWABLE_TABLET | Freq: Every day | ORAL | Status: DC
  Administered 2012-09-19 – 2012-10-01 (×13): 81 mg via ORAL
  Filled 2012-09-18 (×13): qty 1

## 2012-09-18 MED ORDER — ROFLUMILAST 500 MCG PO TABS
500.0000 ug | ORAL_TABLET | Freq: Every day | ORAL | Status: DC
  Administered 2012-09-19 – 2012-09-24 (×6): 500 ug via ORAL
  Filled 2012-09-18 (×9): qty 1

## 2012-09-18 MED ORDER — ACETAMINOPHEN 650 MG RE SUPP: 650.0000 mg | Freq: Four times a day (QID) | RECTAL | Status: DC | PRN

## 2012-09-18 MED ORDER — IPRATROPIUM BROMIDE 0.02 % IN SOLN
0.5000 mg | RESPIRATORY_TRACT | Status: DC
  Administered 2012-09-18 – 2012-10-01 (×75): 0.5 mg via RESPIRATORY_TRACT
  Filled 2012-09-18 (×70): qty 2.5

## 2012-09-18 MED ORDER — ALBUTEROL SULFATE (5 MG/ML) 0.5% IN NEBU: 15.0000 mg | INHALATION_SOLUTION | Freq: Once | RESPIRATORY_TRACT | Status: DC

## 2012-09-18 MED ORDER — MORPHINE SULFATE 2 MG/ML IJ SOLN
1.0000 mg | INTRAMUSCULAR | Status: DC | PRN
  Administered 2012-09-18 – 2012-09-22 (×13): 1 mg via INTRAVENOUS
  Filled 2012-09-18 (×13): qty 1

## 2012-09-18 MED ORDER — CYCLOBENZAPRINE HCL 10 MG PO TABS
10.0000 mg | ORAL_TABLET | Freq: Three times a day (TID) | ORAL | Status: DC
  Administered 2012-09-18 – 2012-09-19 (×2): 10 mg via ORAL
  Filled 2012-09-18 (×2): qty 1

## 2012-09-18 MED ORDER — ONDANSETRON HCL 4 MG/2ML IJ SOLN
4.0000 mg | Freq: Four times a day (QID) | INTRAMUSCULAR | Status: DC | PRN
  Administered 2012-09-25: 4 mg via INTRAVENOUS
  Filled 2012-09-18: qty 2

## 2012-09-18 MED ORDER — METHYLPREDNISOLONE SODIUM SUCC 125 MG IJ SOLR
125.0000 mg | Freq: Once | INTRAMUSCULAR | Status: AC
  Administered 2012-09-18: 125 mg via INTRAVENOUS
  Filled 2012-09-18: qty 2

## 2012-09-18 MED ORDER — HYDROCHLOROTHIAZIDE 10 MG/ML ORAL SUSPENSION
6.2500 mg | Freq: Every day | ORAL | Status: DC
  Filled 2012-09-18 (×4): qty 1.25

## 2012-09-18 MED ORDER — MOMETASONE FURO-FORMOTEROL FUM 100-5 MCG/ACT IN AERO
2.0000 | INHALATION_SPRAY | Freq: Two times a day (BID) | RESPIRATORY_TRACT | Status: DC
  Administered 2012-09-18 – 2012-10-01 (×23): 2 via RESPIRATORY_TRACT
  Filled 2012-09-18: qty 8.8

## 2012-09-18 MED ORDER — HYDROCODONE-ACETAMINOPHEN 5-325 MG PO TABS
1.0000 | ORAL_TABLET | Freq: Four times a day (QID) | ORAL | Status: DC | PRN
  Administered 2012-09-18 – 2012-09-22 (×9): 1 via ORAL
  Filled 2012-09-18 (×9): qty 1

## 2012-09-18 MED ORDER — MILNACIPRAN HCL 50 MG PO TABS
50.0000 mg | ORAL_TABLET | Freq: Two times a day (BID) | ORAL | Status: DC
  Administered 2012-09-18 – 2012-09-24 (×11): 50 mg via ORAL
  Filled 2012-09-18 (×17): qty 1

## 2012-09-18 MED ORDER — ACETAMINOPHEN 325 MG PO TABS
650.0000 mg | ORAL_TABLET | Freq: Four times a day (QID) | ORAL | Status: DC | PRN
  Administered 2012-09-23: 650 mg via ORAL
  Filled 2012-09-18: qty 1

## 2012-09-18 MED ORDER — LEVOFLOXACIN IN D5W 750 MG/150ML IV SOLN
750.0000 mg | INTRAVENOUS | Status: DC
  Administered 2012-09-18 – 2012-09-21 (×4): 750 mg via INTRAVENOUS
  Filled 2012-09-18 (×5): qty 150

## 2012-09-18 MED ORDER — ALUM & MAG HYDROXIDE-SIMETH 200-200-20 MG/5ML PO SUSP
30.0000 mL | Freq: Four times a day (QID) | ORAL | Status: DC | PRN
  Administered 2012-09-19 – 2012-09-25 (×5): 30 mL via ORAL
  Filled 2012-09-18 (×5): qty 30

## 2012-09-18 MED ORDER — FINASTERIDE 5 MG PO TABS
5.0000 mg | ORAL_TABLET | Freq: Every day | ORAL | Status: DC
  Administered 2012-09-19 – 2012-09-24 (×6): 5 mg via ORAL
  Filled 2012-09-18 (×9): qty 1

## 2012-09-18 MED ORDER — HYDROCHLOROTHIAZIDE 12.5 MG PO CAPS
12.5000 mg | ORAL_CAPSULE | Freq: Every day | ORAL | Status: DC
  Administered 2012-09-19 – 2012-09-24 (×6): 12.5 mg via ORAL
  Filled 2012-09-18 (×6): qty 1

## 2012-09-18 MED ORDER — IPRATROPIUM BROMIDE 0.02 % IN SOLN
1.0000 mg | Freq: Once | RESPIRATORY_TRACT | Status: AC
  Administered 2012-09-18: 0.5 mg via RESPIRATORY_TRACT
  Filled 2012-09-18: qty 2.5

## 2012-09-18 MED ORDER — ALBUTEROL SULFATE (5 MG/ML) 0.5% IN NEBU
2.5000 mg | INHALATION_SOLUTION | RESPIRATORY_TRACT | Status: DC
  Administered 2012-09-18 – 2012-10-01 (×75): 2.5 mg via RESPIRATORY_TRACT
  Filled 2012-09-18 (×70): qty 0.5

## 2012-09-18 MED ORDER — METHYLPREDNISOLONE SODIUM SUCC 125 MG IJ SOLR
125.0000 mg | Freq: Four times a day (QID) | INTRAMUSCULAR | Status: DC
  Administered 2012-09-18 – 2012-09-22 (×15): 125 mg via INTRAVENOUS
  Filled 2012-09-18 (×15): qty 2

## 2012-09-18 MED ORDER — LISINOPRIL-HYDROCHLOROTHIAZIDE 20-12.5 MG PO TABS: 0.5000 | ORAL_TABLET | Freq: Every day | ORAL | Status: DC

## 2012-09-18 MED ORDER — GABAPENTIN 300 MG PO CAPS
300.0000 mg | ORAL_CAPSULE | Freq: Every day | ORAL | Status: DC
  Administered 2012-09-18: 300 mg via ORAL
  Filled 2012-09-18: qty 1

## 2012-09-18 MED ORDER — ALBUTEROL SULFATE (5 MG/ML) 0.5% IN NEBU
2.5000 mg | INHALATION_SOLUTION | RESPIRATORY_TRACT | Status: DC | PRN
  Administered 2012-09-22: 2.5 mg via RESPIRATORY_TRACT
  Filled 2012-09-18: qty 0.5

## 2012-09-18 MED ORDER — ALBUTEROL SULFATE (5 MG/ML) 0.5% IN NEBU
INHALATION_SOLUTION | RESPIRATORY_TRACT | Status: AC
  Filled 2012-09-18: qty 1

## 2012-09-18 MED ORDER — LEVOFLOXACIN IN D5W 500 MG/100ML IV SOLN
500.0000 mg | INTRAVENOUS | Status: DC
  Filled 2012-09-18: qty 100

## 2012-09-18 MED ORDER — ONDANSETRON HCL 4 MG PO TABS: 4.0000 mg | ORAL_TABLET | Freq: Four times a day (QID) | ORAL | Status: DC | PRN

## 2012-09-18 MED ORDER — ALBUTEROL (5 MG/ML) CONTINUOUS INHALATION SOLN
10.0000 mg/h | INHALATION_SOLUTION | RESPIRATORY_TRACT | Status: DC
  Administered 2012-09-18: 10 mg/h via RESPIRATORY_TRACT
  Filled 2012-09-18: qty 20

## 2012-09-18 MED ORDER — LISINOPRIL 10 MG PO TABS
10.0000 mg | ORAL_TABLET | Freq: Every day | ORAL | Status: DC
  Administered 2012-09-19 – 2012-09-24 (×6): 10 mg via ORAL
  Filled 2012-09-18 (×7): qty 1

## 2012-09-18 MED ORDER — ENOXAPARIN SODIUM 40 MG/0.4ML ~~LOC~~ SOLN
40.0000 mg | SUBCUTANEOUS | Status: DC
  Administered 2012-09-18 – 2012-09-30 (×13): 40 mg via SUBCUTANEOUS
  Filled 2012-09-18 (×13): qty 0.4

## 2012-09-18 MED ORDER — SODIUM CHLORIDE 0.9 % IV SOLN
INTRAVENOUS | Status: DC
  Administered 2012-09-18 – 2012-09-19 (×2): 1000 mL via INTRAVENOUS
  Administered 2012-09-19 – 2012-09-20 (×4): via INTRAVENOUS
  Administered 2012-09-20: 1000 mL via INTRAVENOUS
  Administered 2012-09-21: 125 mL via INTRAVENOUS
  Administered 2012-09-21 – 2012-09-26 (×9): via INTRAVENOUS
  Administered 2012-09-27: 1000 mL via INTRAVENOUS
  Administered 2012-09-27 – 2012-09-29 (×3): via INTRAVENOUS

## 2012-09-18 NOTE — ED Notes (Signed)
Increased SOB since yesterday. Pt has history of COPD. 90-92% on 2l normally. Pt today was 85% on room air

## 2012-09-18 NOTE — H&P (Signed)
Connor Armstrong MRN: [id number] DOB/AGE: 19-Jun-1951 62 y.o. Primary Care Physician:Demaris Leavell L, MD Admit date: 09/18/2012 Chief Complaint: Shortness of breath HPI: This is a 61 year old with known severe COPD. I had seen him in my office about a week ago and he said he didn't feel quite as well but did not feel acutely ill at that time. Since that time he is a increasing shortness of breath and today he noted that his oxygen saturation was in his 80s. His shortness of breath became significantly worse yesterday. He had blood gas done that showed he was hypoxic and his PCO2 was almost 100. He has been having myoclonic jerks which I suppose may be related to his PCO2. He has had what he thinks is restless leg syndrome. He has severe COPD, hypertension and fibromyalgia.  Past Medical History  Diagnosis Date  . COPD (chronic obstructive pulmonary disease)   . Hypertension   . Fibromyalgia   . Shingles   . Cancer    Past Surgical History  Procedure Date  . Hernia repair   . Thyroid surgery   . Parathyroid surgery   . Kidney surgery         Family History  Problem Relation Age of Onset  . Hypertension Mother     Social History:  reports that he has been smoking.  He does not have any smokeless tobacco history on file. He reports that he does not drink alcohol or use illicit drugs.   Allergies: No Known Allergies  Medications Prior to Admission  Medication Sig Dispense Refill  . acetaminophen (TYLENOL) 500 MG tablet Take 1,000 mg by mouth every 6 (six) hours as needed. For pain       . albuterol (PROVENTIL HFA;VENTOLIN HFA) 108 (90 BASE) MCG/ACT inhaler Inhale 2 puffs into the lungs every 6 (six) hours as needed. For shortness of breath       . albuterol (PROVENTIL) (2.5 MG/3ML) 0.083% nebulizer solution Take 3 mLs (2.5 mg total) by nebulization every 6 (six) hours as needed for wheezing.  75 mL  12  . aspirin 81 MG chewable tablet Chew 81 mg by mouth daily.      .  cyclobenzaprine (FLEXERIL) 10 MG tablet Take 10 mg by mouth 2 (two) times daily.        Marland Kitchen dextromethorphan-guaiFENesin (MUCINEX DM) 30-600 MG per 12 hr tablet Take 1 tablet by mouth every 12 (twelve) hours as needed. For congestion      . finasteride (PROSCAR) 5 MG tablet Take 5 mg by mouth daily.      . Fluticasone-Salmeterol (ADVAIR) 250-50 MCG/DOSE AEPB Inhale 1 puff into the lungs every 12 (twelve) hours.        . gabapentin (NEURONTIN) 300 MG capsule Take 300 mg by mouth at bedtime.      Marland Kitchen HYDROcodone-acetaminophen (NORCO) 5-325 MG per tablet Take 1 tablet by mouth every 6 (six) hours as needed. For shingles pain      . lisinopril-hydrochlorothiazide (PRINZIDE,ZESTORETIC) 20-12.5 MG per tablet Take 0.5 tablets by mouth daily.        . Milnacipran (SAVELLA) 50 MG TABS Take 50 mg by mouth 2 (two) times daily.        . roflumilast (DALIRESP) 500 MCG TABS tablet Take 1 tablet (500 mcg total) by mouth daily.  30 tablet  12  . tadalafil (CIALIS) 5 MG tablet Take 5 mg by mouth daily.       Marland Kitchen tiotropium (SPIRIVA) 18 MCG inhalation capsule Place 18 mcg  into inhaler and inhale daily.             BJY:NWGNF from the symptoms mentioned above,there are no other symptoms referable to all systems reviewed.  Physical Exam: Blood pressure 123/80, pulse 105, temperature 97.8 F (36.6 C), temperature source Oral, resp. rate 17, height 5\' 9"  (1.753 m), weight 66.4 kg (146 lb 6.2 oz), SpO2 96.00%. He is awake and alert. He is short of breath but looks relatively comfortable. He is using the accessory muscles of respiration. His pupils are reactive to light and accommodation. Extraocular muscles are intact. Tympanic membranes are okay. His neck is supple without masses. He does not have any JVD. His chest shows markedly diminished breath sounds and prolonged expiration but no wheezing now. His heart is regular without murmur gallop or rub. His abdomen is soft without masses and bowel sounds are present and  active. Extremities show no edema. He is very tremulous.    Basename 09/18/12 0823  WBC 10.1  NEUTROABS 8.9*  HGB 13.1  HCT 44.0  MCV 100.0  PLT 231    Basename 09/18/12 0823  NA 138  K 4.9  CL 91*  CO2 >45*  GLUCOSE 137*  BUN 27*  CREATININE 1.28  CALCIUM 9.3  MG --  lablast2(ast:2,ALT:2,alkphos:2,bilitot:2,prot:2,albumin:2)@    Recent Results (from the past 240 hour(s))  MRSA PCR SCREENING     Status: Normal   Collection Time   09/18/12  2:55 PM      Component Value Range Status Comment   MRSA by PCR NEGATIVE  NEGATIVE Final      Dg Chest 2 View  09/18/2012  *RADIOLOGY REPORT*  Clinical Data: Shortness of breath, cough, COPD  CHEST - 2 VIEW  Comparison: 06/13/2012  Findings: Chronic interstitial markings/hyperinflation.  No focal consolidation. No pleural effusion or pneumothorax.  The heart is normal in size.  Stable right hilar prominence, likely vascular.  Visualized osseous structures are within normal limits.  Surgical clips in the right neck.  IMPRESSION: No evidence of acute cardiopulmonary disease.   Original Report Authenticated By: Charline Bills, M.D.    Impression: He has COPD exacerbation. He is being treated with BiPAP steroids antibiotics. He will be checked for influenza. Active Problems:  * No active hospital problems. *      Plan: As above      Torres Hardenbrook L Pager [phone]  09/18/2012, 6:49 PM

## 2012-09-18 NOTE — ED Provider Notes (Signed)
History     CSN: [id number]  Arrival date & time 09/18/12  0903   First MD Initiated Contact with Patient 09/18/12 [phone]      Chief Complaint  Patient presents with  . Shortness of Breath    (Consider location/radiation/quality/duration/timing/severity/associated sxs/prior treatment) HPI Comments: Patient is a 62 year old patient of Dr. Juanetta Gosling who presents to the emergency department with the complaint of shortness of breath," lightheadedness and fuzzy", and low oxygen level. The patient has a long-term history of chronic obstructive pulmonary disease. He is currently on oxygen at home. He usually notices his oxygen levels to be 90-92 on 2 L of oxygen by nasal cannula. Today he noticed that they were down to 80-85 on 2 L, and even lower on room air. The patient denies excessive cough. He's not had any high fever. He's not had any choking. It is of note that he continues to smoke occasionally.  Patient is a 62 y.o. male presenting with shortness of breath. The history is provided by the patient.  Shortness of Breath  The current episode started yesterday. The onset was gradual. The problem occurs continuously. The problem has been gradually worsening. Nothing relieves the symptoms. The symptoms are aggravated by activity. Associated symptoms include shortness of breath and wheezing. Pertinent negatives include no chest pain, no chest pressure, no fever, no sore throat, no stridor and no cough. There was no intake of a foreign body. He was not exposed to toxic fumes. He has had intermittent steroid use. He has had prior hospitalizations. He has had prior ICU admissions. His past medical history is significant for past wheezing. His past medical history does not include asthma or bronchiolitis. He has been fussy. Urine output has been normal. The last void occurred less than 6 hours ago. There were no sick contacts. Recently, medical care has been given by the PCP. Services received include  medications given.    Past Medical History  Diagnosis Date  . COPD (chronic obstructive pulmonary disease)   . Hypertension   . Fibromyalgia   . Shingles   . Cancer     Past Surgical History  Procedure Date  . Hernia repair   . Thyroid surgery   . Parathyroid surgery   . Kidney surgery     Family History  Problem Relation Age of Onset  . Hypertension Mother     History  Substance Use Topics  . Smoking status: Current Every Day Smoker -- 1.0 packs/day  . Smokeless tobacco: Not on file  . Alcohol Use: No      Review of Systems  Constitutional: Negative for fever and activity change.       All ROS Neg except as noted in HPI  HENT: Negative for nosebleeds, sore throat and neck pain.   Eyes: Negative for photophobia and discharge.  Respiratory: Positive for shortness of breath and wheezing. Negative for cough and stridor.   Cardiovascular: Negative for chest pain and palpitations.  Gastrointestinal: Negative for abdominal pain and blood in stool.  Genitourinary: Negative for dysuria, frequency and hematuria.  Musculoskeletal: Negative for back pain and arthralgias.  Skin: Negative.   Neurological: Negative for dizziness, seizures and speech difficulty.  Psychiatric/Behavioral: Negative for hallucinations and confusion.    Allergies  Review of patient's allergies indicates no known allergies.  Home Medications   Current Outpatient Rx  Name  Route  Sig  Dispense  Refill  . ACETAMINOPHEN 500 MG PO TABS   Oral   Take 1,000 mg by mouth  every 6 (six) hours as needed. For pain          . ACYCLOVIR 800 MG PO TABS   Oral   Take 800 mg by mouth 5 (five) times daily.         . ALBUTEROL SULFATE HFA 108 (90 BASE) MCG/ACT IN AERS   Inhalation   Inhale 2 puffs into the lungs every 6 (six) hours as needed. For shortness of breath          . ALBUTEROL SULFATE (2.5 MG/3ML) 0.083% IN NEBU   Nebulization   Take 3 mLs (2.5 mg total) by nebulization every 6 (six)  hours as needed for wheezing.   75 mL   12   . ASPIRIN 81 MG PO CHEW   Oral   Chew 81 mg by mouth daily.         . CYCLOBENZAPRINE HCL 10 MG PO TABS   Oral   Take 10 mg by mouth 2 (two) times daily.           Marland Kitchen DM-GUAIFENESIN ER 30-600 MG PO TB12   Oral   Take 1 tablet by mouth every 12 (twelve) hours as needed. For congestion         . FLUTICASONE-SALMETEROL 250-50 MCG/DOSE IN AEPB   Inhalation   Inhale 1 puff into the lungs every 12 (twelve) hours.           Marland Kitchen HYDROCODONE-ACETAMINOPHEN 5-325 MG PO TABS   Oral   Take 1 tablet by mouth every 6 (six) hours as needed. For shingles pain         . LISINOPRIL-HYDROCHLOROTHIAZIDE 20-12.5 MG PO TABS   Oral   Take 0.5 tablets by mouth daily.           Marland Kitchen MILNACIPRAN HCL 50 MG PO TABS   Oral   Take 50 mg by mouth 2 (two) times daily.           . ROFLUMILAST 500 MCG PO TABS   Oral   Take 1 tablet (500 mcg total) by mouth daily.   30 tablet   12   . TADALAFIL 5 MG PO TABS   Oral   Take 5 mg by mouth daily.          Marland Kitchen TIOTROPIUM BROMIDE MONOHYDRATE 18 MCG IN CAPS   Inhalation   Place 18 mcg into inhaler and inhale daily.             BP 169/134  Temp 97.8 F (36.6 C) (Oral)  Ht 5' 8.5" (1.74 m)  Wt 144 lb (65.318 kg)  BMI 21.58 kg/m2  SpO2 97%  Physical Exam  Nursing note and vitals reviewed. Constitutional: He is oriented to person, place, and time. He appears well-developed and well-nourished.  Non-toxic appearance.  HENT:  Head: Normocephalic.  Right Ear: Tympanic membrane and external ear normal.  Left Ear: Tympanic membrane and external ear normal.  Eyes: EOM and lids are normal. Pupils are equal, round, and reactive to light.  Neck: Normal range of motion. Neck supple. Carotid bruit is not present.  Cardiovascular: Normal rate, regular rhythm, normal heart sounds, intact distal pulses and normal pulses.   Pulmonary/Chest: No respiratory distress. He has wheezes.       Patient has mild  retraction. He speaks in short sentences at this time.  Abdominal: Soft. Bowel sounds are normal. There is no tenderness. There is no guarding.  Musculoskeletal: Normal range of motion.       During my  examination and interview patient had jerks and takes particularly of the upper extremities. Patient states he has had this at times with low oxygen levels. No cyanosis or edema of the upper or lower extremities.  Lymphadenopathy:       Head (right side): No submandibular adenopathy present.       Head (left side): No submandibular adenopathy present.    He has no cervical adenopathy.  Neurological: He is alert and oriented to person, place, and time. He has normal strength. No cranial nerve deficit or sensory deficit.  Skin: Skin is warm and dry.  Psychiatric: He has a normal mood and affect. His speech is normal.    ED Course  CRITICAL CARE Performed by: Kathie Dike Authorized by: Kathie Dike Total critical care time: 35 minutes Critical care was necessary to treat or prevent imminent or life-threatening deterioration of the following conditions: metabolic crisis (hypoxia and hypercarbnia). Critical care was time spent personally by me on the following activities: development of treatment plan with patient or surrogate, discussions with primary provider, evaluation of patient's response to treatment, examination of patient, obtaining history from patient or surrogate, ordering and performing treatments and interventions, ordering and review of laboratory studies, ordering and review of radiographic studies, pulse oximetry, re-evaluation of patient's condition and review of old charts.   (including critical care time)   Labs Reviewed  CBC WITH DIFFERENTIAL  BASIC METABOLIC PANEL  BLOOD GAS, ARTERIAL   No results found.   No diagnosis found.    MDM  I have reviewed nursing notes, vital signs, and all appropriate lab and imaging results for this patient. Pulse oximetry  100% on 4 L of oxygen by nasal cannula. The patient's oxygen has been turned down to 2 L due to his previous history of hypercarbia. Will reassess after labs obtained. Chest x-ray reveals no evidence of acute cardiopulmonary disease. Complete blood count reveals the WBC to be 10.1, hemoglobin is 13.1 and a hematocrit of 44 both within normal limits. Arterial blood gas on 2 L of oxygen reveals a pH of 7.29 which is low, PCO2 is elevated at 99.8, PO2 was low at 57.8, the basic metabolic panel shows the sodium to 138 potassium 4.9 both which are normal. The chloride is low at 91, the CO2 is less than 45 which is critical, the glucose is 137 and BUN is 27 both of which are elevated. Creatinine is 1.28. and bicarbonate is high at 46.5. Patient placed on BiPAP.  patient placed on Levaquin. Patient observed and seems to be improving with the BiPAP and Solu- Medrol.  Case  discussed with the Dr Juanetta Gosling and the patient will be admitted. Vital signs, expecially pulse ox monitored closely.     Kathie Dike, [place] 09/24/12 2219

## 2012-09-18 NOTE — ED Notes (Signed)
Respiratory paged and aware of breathing treatment and bipap

## 2012-09-18 NOTE — ED Notes (Signed)
CRITICAL VALUE ALERT  Critical value received: ABG-  PH 7.29, PCO2 99.8, PO2-67.8 FIO2- 89.4 2L Bicarb 46.5  Date of notification:  09/18/2012  Time of notification:  1014  Critical value read back:yes  Nurse who received alert:  A. Nedra Hai RN  MD notified (1st page):  H. Beverely Pace PA  Time of first page:  1014  MD notified (2nd page):  Time of second page:  Responding MD:  Loney Laurence PA  Time MD responded:  [phone]

## 2012-09-18 NOTE — ED Provider Notes (Signed)
Medical screening examination/treatment/procedure(s) were conducted as a shared visit with non-physician practitioner(s) and myself.  I personally evaluated the patient during the encounter.  62yo M, c/o increasing SOB since yesterday.  Noticed home O2 Sat to be in 80's on his usual O2 N/C (usually low 90's).  Has significant hx COPD and several admits for same.  A&O, lungs diminished with scattered wheezes, tachycardic.  Hypoxic and hypercarbic on ABG.  Bipap started.  IV solumedrol given and continuous neb begun.  CXR without pneumonia.  Appears acute exacerbation of COPD.  While on Bipap, Sats 94-96%, lungs diminished with wheezes, speaking full sentences with his wife at bedside, A&O/mentating per baseline. Will start IV levaquin, admit to ICU.       CRITICAL CARE Performed by: Laray Anger Total critical care time: 35 Critical care time was exclusive of separately billable procedures and treating other patients. Critical care was necessary to treat or prevent imminent or life-threatening deterioration. Critical care was time spent personally by me on the following activities: development of treatment plan with patient and/or surrogate as well as nursing, discussions with consultants, evaluation of patient's response to treatment, examination of patient, obtaining history from patient or surrogate, ordering and performing treatments and interventions, ordering and review of laboratory studies, ordering and review of radiographic studies, pulse oximetry and re-evaluation of patient's condition.    Laray Anger, DO 09/18/12 [phone]

## 2012-09-19 ENCOUNTER — Inpatient Hospital Stay (HOSPITAL_COMMUNITY)
Admit: 2012-09-19 | Discharge: 2012-09-19 | Disposition: A | Discharge status: Home / Self Care | Payer: Medicare Other | Attending: Neurology | Admitting: Neurology

## 2012-09-19 DIAGNOSIS — G253 Myoclonus: Secondary | ICD-10-CM | POA: Diagnosis present

## 2012-09-19 LAB — BLOOD GAS, ARTERIAL
Acid-Base Excess: 16.3 mmol/L — ABNORMAL HIGH (ref 0.0–2.0)
Bicarbonate: 42.2 meq/L — ABNORMAL HIGH (ref 20.0–24.0)
Delivery systems: POSITIVE
Drawn by: 22223
Expiratory PAP: 5
FIO2: 35 %
Inspiratory PAP: 10
O2 Saturation: 92.2 %
Patient temperature: 37
TCO2: 38.4 mmol/L (ref 0–100)
pCO2 arterial: 71.9 mmHg (ref 35.0–45.0)
pH, Arterial: 7.386 (ref 7.350–7.450)
pO2, Arterial: 63 mmHg — ABNORMAL LOW (ref 80.0–100.0)

## 2012-09-19 LAB — CBC
HCT: 38 % — ABNORMAL LOW (ref 39.0–52.0)
Hemoglobin: 12 g/dL — ABNORMAL LOW (ref 13.0–17.0)
MCH: 29.8 pg (ref 26.0–34.0)
MCHC: 31.6 g/dL (ref 30.0–36.0)
MCV: 94.3 fL (ref 78.0–100.0)
Platelets: 234 K/uL (ref 150–400)
RBC: 4.03 MIL/uL — ABNORMAL LOW (ref 4.22–5.81)
RDW: 14.8 % (ref 11.5–15.5)
WBC: 10.1 K/uL (ref 4.0–10.5)

## 2012-09-19 LAB — BASIC METABOLIC PANEL
BUN: 23 mg/dL (ref 6–23)
CO2: 40 mEq/L (ref 19–32)
Chloride: 88 mEq/L — ABNORMAL LOW (ref 96–112)
GFR calc Af Amer: 82 mL/min — ABNORMAL LOW (ref >90)
Potassium: 4.1 mEq/L (ref 3.5–5.1)

## 2012-09-19 LAB — MAGNESIUM: Magnesium: 2.2 mg/dL (ref 1.5–2.5)

## 2012-09-19 LAB — TSH: TSH: 0.926 u[IU]/mL (ref 0.350–4.500)

## 2012-09-19 LAB — SYPHILIS: RPR W/REFLEX TO RPR TITER AND TREPONEMAL ANTIBODIES, TRADITIONAL SCREENING AND DIAGNOSIS ALGORITHM: RPR Ser Ql: NONREACTIVE

## 2012-09-19 LAB — VITAMIN B12: Vitamin B-12: 487 pg/mL (ref 211–911)

## 2012-09-19 MED ORDER — GABAPENTIN 300 MG PO CAPS
600.0000 mg | ORAL_CAPSULE | Freq: Every day | ORAL | Status: DC
  Administered 2012-09-19 – 2012-09-21 (×3): 600 mg via ORAL
  Filled 2012-09-19: qty 2
  Filled 2012-09-19 (×2): qty 1
  Filled 2012-09-19: qty 2

## 2012-09-19 MED ORDER — CYCLOBENZAPRINE HCL 10 MG PO TABS
10.0000 mg | ORAL_TABLET | Freq: Four times a day (QID) | ORAL | Status: DC
  Administered 2012-09-19 – 2012-09-24 (×18): 10 mg via ORAL
  Filled 2012-09-19 (×19): qty 1

## 2012-09-19 MED ORDER — BOOST / RESOURCE BREEZE PO LIQD
1.0000 | Freq: Three times a day (TID) | ORAL | Status: DC
  Administered 2012-09-19 – 2012-09-23 (×14): 1 via ORAL

## 2012-09-19 MED ORDER — POLYETHYLENE GLYCOL 3350 17 G PO PACK
17.0000 g | PACK | Freq: Every day | ORAL | Status: DC
  Administered 2012-09-19 – 2012-09-24 (×6): 17 g via ORAL
  Filled 2012-09-19 (×6): qty 1

## 2012-09-19 MED ORDER — DOCUSATE SODIUM 100 MG PO CAPS
100.0000 mg | ORAL_CAPSULE | Freq: Two times a day (BID) | ORAL | Status: DC
  Administered 2012-09-19 – 2012-09-24 (×10): 100 mg via ORAL
  Filled 2012-09-19 (×10): qty 1

## 2012-09-19 NOTE — Progress Notes (Signed)
INITIAL NUTRITION ASSESSMENT  DOCUMENTATION CODES Per approved criteria  -Not Applicable   INTERVENTION: Resource Breeze po TID, each supplement provides 250 kcal and 9 grams of protein.  NUTRITION DIAGNOSIS: Inadequate oral intake related to shortness of breath as evidenced by pt acute respiratory failure, wt loss hx and clear liquids diet .   Goal: Pt to meet >/= 90% of their estimated nutrition needs; not met  Monitor:  Diet advancement, Nutrition intake and adequacy, labs and wt changes  Reason for Assessment: Malnutrition Screen Score=4  62 y.o. male  Admitting Dx: Acute-on-chronic respiratory failure  ASSESSMENT: Pt has hx of wt loss and poor appetite. His wt has decreased 22#, 13% over past 12 months. He is also having difficulty eating due to shortness of breath. Currently on Clear Liquids.  His oral intake is inadequate to meet estimated needs. He does not meet criteria for malnutrition at this time but is certainly at risk. Will continue to follow and re-evaluate as indicated.  Height: Ht Readings from Last 1 Encounters:  09/18/12 5\' 9"  (1.753 m)    Weight: Wt Readings from Last 1 Encounters:  09/19/12 152 lb 1.9 oz (69 kg)    Ideal Body Weight: 160# (72.7 kg)  % Ideal Body Weight: 95%  Wt Readings from Last 10 Encounters:  09/19/12 152 lb 1.9 oz (69 kg)  12/13/11 190 lb 4.1 oz (86.3 kg)  09/26/11 162 lb 6.4 oz (73.664 kg)  09/21/11 174 lb 13.2 oz (79.3 kg)    Usual Body Weight: 174# (79kg)  % Usual Body Weight: 87%   BMI:  Body mass index is 22.46 kg/(m^2). Normal range  Estimated Nutritional Needs: Kcal: 1725-2070 Protein: 75-90 gr Fluid: 1 ml/kcal  Skin: no issues noted  Diet Order: Clear Liquid  EDUCATION NEEDS: -No education needs identified at this time   Intake/Output Summary (Last 24 hours) at 09/19/12 1039 Last data filed at 09/19/12 0800  Gross per 24 hour  Intake    970 ml  Output   1450 ml  Net   -480 ml    Last BM:  09/17/12  Labs:   Lab 09/19/12 0452 09/18/12 0823  NA 133* 138  K 4.1 4.9  CL 88* 91*  CO2 40* >45*  BUN 23 27*  CREATININE 1.10 1.28  CALCIUM 8.6 9.3  MG 2.2 --  PHOS -- --  GLUCOSE 144* 137*    CBG (last 3)  No results found for this basename: GLUCAP:3 in the last 72 hours  Scheduled Meds:   . albuterol  2.5 mg Nebulization Q4H  . aspirin  81 mg Oral Daily  . cyclobenzaprine  10 mg Oral QID  . docusate sodium  100 mg Oral BID  . enoxaparin (LOVENOX) injection  40 mg Subcutaneous Q24H  . finasteride  5 mg Oral Daily  . gabapentin  600 mg Oral QHS  . hydrochlorothiazide  12.5 mg Oral Daily  . ipratropium  0.5 mg Nebulization Q4H  . levofloxacin  750 mg Intravenous Q24H  . lisinopril  10 mg Oral Daily  . methylPREDNISolone (SOLU-MEDROL) injection  125 mg Intravenous Q6H  . Milnacipran  50 mg Oral BID  . mometasone-formoterol  2 puff Inhalation BID  . polyethylene glycol  17 g Oral Daily  . roflumilast  500 mcg Oral Daily    Continuous Infusions:   . sodium chloride 125 mL/hr at 09/19/12 1021  . albuterol 10 mg/hr (09/18/12 1049)    Past Medical History  Diagnosis Date  . COPD (  chronic obstructive pulmonary disease)   . Hypertension   . Fibromyalgia   . Shingles   . Cancer     Past Surgical History  Procedure Date  . Hernia repair   . Thyroid surgery   . Parathyroid surgery   . Kidney surgery     [phone]

## 2012-09-19 NOTE — Progress Notes (Signed)
EEG completed.

## 2012-09-19 NOTE — Consult Note (Signed)
[facility] Connor Esteve A. Gerilyn Pilgrim, MD     [email or web address]          Connor Armstrong is an 62 y.o. male.   ASSESSMENT/PLAN: Myoclonus. The vast majority of cases of myoclonus is typically due to toxic metabolic causes such as metabolic disturbances or medication effect. Primary neurological causes are rare. Myoclonus responds very well to benzodiazepines particularly clonazepam. However, I would be very reluctant to use benzodiazepines in this patient given his severe COPD. He is on Flexeril which she seemed to tolerate well. The dose will be increased to 4 times a day dosing. 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 He also is on gabapentin and this will be increased to help with the myoclonus. I will also do typical labs such as vitamin B12 level, TSH and RPR. It may be reasonable to do an EEG of the brain as a rare cause of myoclonus could be seizures/epilepsy.  The patient is 62 year old white male who has a baseline history of COPD that has has been severe. The patient presents with a relatively acute onset of dyspnea and was admitted to the hospital and placed in the ICU. He does not report chest pain. The patient has a history of muscle disease with muscle cramps involving various muscles from the neck down. The patient tells me that he has been evaluated very extensively at [facility] in [place]. He tells me that he has had extensive electrodiagnostic testing, needle EMG, imaging and extensive labs. The workup was unrevealing. He continues to have cramping and spasms of ferrous muscles. He does not think that they have gotten worse particularly. He has had this now for several years. He was worked up at [facility] at that time for his symptoms approximately 4 years ago. He was told that he does not have a more serious myopathy such as muscular dystrophy. The patient has been taking Flexeril which  seems to work fairly well. he patient even reports that sometimes he has abdominal pain due to cramping and spasm of the abdominal muscles. He reports having headaches from time to time. There is no GU symptoms. More recently over the last several months, he has developed jerking of the arms legs and sometimes a total body. The symptoms seems worse in the evening times and also at nighttime's. These type of jerking apparently has been getting worse. They seem not to respond well to the Flexeril. He was given Requip by Dr. Juanetta Gosling but this has not been effective. He was recently started on Neurontin which has not worked at a low dose 300 mg at bedtime. The patient does not report having sensory discomfort associated with these limb jerks. Again, sometimes he has told jerking involving the entire body.   GENERAL: This is a thin man who is in no acute distress. He is quite tremulous throughout.  HEENT: Unremarkable.  ABDOMEN: soft  EXTREMITIES: No edema   BACK: Unremarkable.  SKIN: Normal by inspection.    MENTAL STATUS: Alert and oriented. Speech, language and cognition are generally intact. Judgment and insight normal.   CRANIAL NERVES: Pupils are equal, round and reactive to light and accomodation; extra ocular movements are full, there is no significant nystagmus; visual fields are full; upper and lower facial muscles are normal in strength and symmetric, there is no flattening of the nasolabial folds; tongue is midline; uvula is midline; shoulder elevation is normal.  MOTOR: Normal tone,  bulk and strength; no pronator drift.  COORDINATION: The patient has continuous moderate to high amplitude and moderate frequency tremors involving the upper extremities. No myoclonus is noted today. There is no dysmetria. There is no bradykinesia, rigidity or parkinsonism.  REFLEXES: Deep tendon reflexes are symmetrical but pathologically brisk throughout the upper and lower extremities. However, Plantar  responses are flexor bilaterally.   SENSATION: Normal to light touch.    Past Medical History  Diagnosis Date  . COPD (chronic obstructive pulmonary disease)   . Hypertension   . Fibromyalgia   . Shingles   . Cancer     Past Surgical History  Procedure Date  . Hernia repair   . Thyroid surgery   . Parathyroid surgery   . Kidney surgery     Family History  Problem Relation Age of Onset  . Hypertension Mother     Social History:  reports that he has been smoking.  He does not have any smokeless tobacco history on file. He reports that he does not drink alcohol or use illicit drugs.  Allergies: No Known Allergies  Medications:  Prior to Admission medications   Medication Sig Start Date End Date Taking? Authorizing Provider  acetaminophen (TYLENOL) 500 MG tablet Take 1,000 mg by mouth every 6 (six) hours as needed. For pain    Yes Historical Provider, MD  albuterol (PROVENTIL HFA;VENTOLIN HFA) 108 (90 BASE) MCG/ACT inhaler Inhale 2 puffs into the lungs every 6 (six) hours as needed. For shortness of breath    Yes Historical Provider, MD  albuterol (PROVENTIL) (2.5 MG/3ML) 0.083% nebulizer solution Take 3 mLs (2.5 mg total) by nebulization every 6 (six) hours as needed for wheezing. 12/14/11 12/13/12 Yes Fredirick Maudlin, MD  aspirin 81 MG chewable tablet Chew 81 mg by mouth daily.   Yes Historical Provider, MD  cyclobenzaprine (FLEXERIL) 10 MG tablet Take 10 mg by mouth 2 (two) times daily.     Yes Historical Provider, MD  dextromethorphan-guaiFENesin (MUCINEX DM) 30-600 MG per 12 hr tablet Take 1 tablet by mouth every 12 (twelve) hours as needed. For congestion   Yes Historical Provider, MD  finasteride (PROSCAR) 5 MG tablet Take 5 mg by mouth daily.   Yes Historical Provider, MD  Fluticasone-Salmeterol (ADVAIR) 250-50 MCG/DOSE AEPB Inhale 1 puff into the lungs every 12 (twelve) hours.     Yes Historical Provider, MD  gabapentin (NEURONTIN) 300 MG capsule Take 300 mg by mouth at  bedtime.   Yes Historical Provider, MD  HYDROcodone-acetaminophen (NORCO) 5-325 MG per tablet Take 1 tablet by mouth every 6 (six) hours as needed. For shingles pain   Yes Historical Provider, MD  lisinopril-hydrochlorothiazide (PRINZIDE,ZESTORETIC) 20-12.5 MG per tablet Take 0.5 tablets by mouth daily.     Yes Historical Provider, MD  Milnacipran (SAVELLA) 50 MG TABS Take 50 mg by mouth 2 (two) times daily.     Yes Historical Provider, MD  roflumilast (DALIRESP) 500 MCG TABS tablet Take 1 tablet (500 mcg total) by mouth daily. 12/14/11  Yes Fredirick Maudlin, MD  tadalafil (CIALIS) 5 MG tablet Take 5 mg by mouth daily.    Yes Historical Provider, MD  tiotropium (SPIRIVA) 18 MCG inhalation capsule Place 18 mcg into inhaler and inhale daily.     Yes Historical Provider, MD   Scheduled Meds:   . albuterol  2.5 mg Nebulization Q4H  . aspirin  81 mg Oral Daily  . cyclobenzaprine  10 mg Oral TID  . docusate sodium  100 mg  Oral BID  . enoxaparin (LOVENOX) injection  40 mg Subcutaneous Q24H  . finasteride  5 mg Oral Daily  . gabapentin  300 mg Oral QHS  . hydrochlorothiazide  12.5 mg Oral Daily  . ipratropium  0.5 mg Nebulization Q4H  . levofloxacin  750 mg Intravenous Q24H  . lisinopril  10 mg Oral Daily  . methylPREDNISolone (SOLU-MEDROL) injection  125 mg Intravenous Q6H  . Milnacipran  50 mg Oral BID  . mometasone-formoterol  2 puff Inhalation BID  . polyethylene glycol  17 g Oral Daily  . roflumilast  500 mcg Oral Daily   Continuous Infusions:   . sodium chloride 125 mL/hr at 09/19/12 0800  . albuterol 10 mg/hr (09/18/12 1049)   PRN Meds:.acetaminophen, acetaminophen, albuterol, alum & mag hydroxide-simeth, HYDROcodone-acetaminophen, morphine injection, ondansetron (ZOFRAN) IV, ondansetron   Blood pressure 123/70, pulse 100, temperature 97.8 F (36.6 C), temperature source Oral, resp. rate 14, height 5\' 9"  (1.753 m), weight 69 kg (152 lb 1.9 oz), SpO2 94.00%.   Results for orders  placed during the hospital encounter of 09/18/12 (from the past 48 hour(s))  CBC WITH DIFFERENTIAL     Status: Abnormal   Collection Time   09/18/12  8:23 AM      Component Value Range Comment   WBC 10.1  4.0 - 10.5 K/uL    RBC 4.40  4.22 - 5.81 MIL/uL    Hemoglobin 13.1  13.0 - 17.0 g/dL    HCT 82.9  56.2 - 13.0 %    MCV 100.0  78.0 - 100.0 fL    MCH 29.8  26.0 - 34.0 pg    MCHC 29.8 (*) 30.0 - 36.0 g/dL    RDW 86.5  78.4 - 69.6 %    Platelets 231  150 - 400 K/uL    Neutrophils Relative 89 (*) 43 - 77 %    Neutro Abs 8.9 (*) 1.7 - 7.7 K/uL    Lymphocytes Relative 4 (*) 12 - 46 %    Lymphs Abs 0.4 (*) 0.7 - 4.0 K/uL    Monocytes Relative 5  3 - 12 %    Monocytes Absolute 0.5  0.1 - 1.0 K/uL    Eosinophils Relative 2  0 - 5 %    Eosinophils Absolute 0.2  0.0 - 0.7 K/uL    Basophils Relative 0  0 - 1 %    Basophils Absolute 0.0  0.0 - 0.1 K/uL   BASIC METABOLIC PANEL     Status: Abnormal   Collection Time   09/18/12  8:23 AM      Component Value Range Comment   Sodium 138  135 - 145 mEq/L    Potassium 4.9  3.5 - 5.1 mEq/L    Chloride 91 (*) 96 - 112 mEq/L    CO2 >45 (*) 19 - 32 mEq/L    Glucose, Bld 137 (*) 70 - 99 mg/dL    BUN 27 (*) 6 - 23 mg/dL    Creatinine, Ser 2.95  0.50 - 1.35 mg/dL    Calcium 9.3  8.4 - 28.4 mg/dL    GFR calc non Af Amer 59 (*) >90 mL/min    GFR calc Af Amer 68 (*) >90 mL/min   BLOOD GAS, ARTERIAL     Status: Abnormal   Collection Time   09/18/12 10:05 AM      Component Value Range Comment   O2 Content 2.0      pH, Arterial 7.290 (*) 7.350 - 7.450  pCO2 arterial 99.8 (*) 35.0 - 45.0 mmHg    pO2, Arterial 57.8 (*) 80.0 - 100.0 mmHg    Bicarbonate 46.5 (*) 20.0 - 24.0 mEq/L    TCO2 42.5  0 - 100 mmol/L    Acid-Base Excess 19.2 (*) 0.0 - 2.0 mmol/L    O2 Saturation 89.4      Patient temperature 37.0      Collection site REVIEWED BY      Drawn by COLLECTED BY RT      Sample type ARTERIAL      Allens test (pass/fail) PASS  PASS   BLOOD GAS,  ARTERIAL     Status: Abnormal   Collection Time   09/18/12  1:35 PM      Component Value Range Comment   FIO2 40.00      Delivery systems BILEVEL POSITIVE AIRWAY PRESSURE      Mode BILEVEL POSITIVE AIRWAY PRESSURE      Inspiratory PAP 10      Expiratory PAP 5      pH, Arterial 7.349 (*) 7.350 - 7.450    pCO2 arterial 84.0 (*) 35.0 - 45.0 mmHg    pO2, Arterial 66.6 (*) 80.0 - 100.0 mmHg    Bicarbonate 45.1 (*) 20.0 - 24.0 mEq/L    TCO2 41.0  0 - 100 mmol/L    Acid-Base Excess 18.5 (*) 0.0 - 2.0 mmol/L    O2 Saturation 93.5      Patient temperature 37.0      Collection site RIGHT RADIAL      Drawn by COLLECTED BY RT      Sample type ARTERIAL      Allens test (pass/fail) PASS  PASS   MRSA PCR SCREENING     Status: Normal   Collection Time   09/18/12  2:55 PM      Component Value Range Comment   MRSA by PCR NEGATIVE  NEGATIVE   INFLUENZA PANEL BY PCR     Status: Normal   Collection Time   09/18/12  2:55 PM      Component Value Range Comment   Influenza A By PCR NEGATIVE  NEGATIVE    Influenza B By PCR NEGATIVE  NEGATIVE    H1N1 flu by pcr NOT DETECTED  NOT DETECTED   BASIC METABOLIC PANEL     Status: Abnormal   Collection Time   09/19/12  4:52 AM      Component Value Range Comment   Sodium 133 (*) 135 - 145 mEq/L    Potassium 4.1  3.5 - 5.1 mEq/L    Chloride 88 (*) 96 - 112 mEq/L    CO2 40 (*) 19 - 32 mEq/L    Glucose, Bld 144 (*) 70 - 99 mg/dL    BUN 23  6 - 23 mg/dL    Creatinine, Ser 8.29  0.50 - 1.35 mg/dL    Calcium 8.6  8.4 - 56.2 mg/dL    GFR calc non Af Amer 71 (*) >90 mL/min    GFR calc Af Amer 82 (*) >90 mL/min   CBC     Status: Abnormal   Collection Time   09/19/12  4:52 AM      Component Value Range Comment   WBC 10.1  4.0 - 10.5 K/uL    RBC 4.03 (*) 4.22 - 5.81 MIL/uL    Hemoglobin 12.0 (*) 13.0 - 17.0 g/dL    HCT 13.0 (*) 86.5 - 52.0 %    MCV 94.3  78.0 -  100.0 fL    MCH 29.8  26.0 - 34.0 pg    MCHC 31.6  30.0 - 36.0 g/dL    RDW 40.9  81.1 - 91.4 %     Platelets 234  150 - 400 K/uL   MAGNESIUM     Status: Normal   Collection Time   09/19/12  4:52 AM      Component Value Range Comment   Magnesium 2.2  1.5 - 2.5 mg/dL   BLOOD GAS, ARTERIAL     Status: Abnormal   Collection Time   09/19/12  5:08 AM      Component Value Range Comment   FIO2 35.00      Delivery systems BILEVEL POSITIVE AIRWAY PRESSURE      Inspiratory PAP 10      Expiratory PAP 5      pH, Arterial 7.386  7.350 - 7.450    pCO2 arterial 71.9 (*) 35.0 - 45.0 mmHg    pO2, Arterial 63.0 (*) 80.0 - 100.0 mmHg    Bicarbonate 42.2 (*) 20.0 - 24.0 mEq/L    TCO2 38.4  0 - 100 mmol/L    Acid-Base Excess 16.3 (*) 0.0 - 2.0 mmol/L    O2 Saturation 92.2      Patient temperature 37.0      Collection site RIGHT RADIAL      Drawn by [id number]      Sample type ARTERIAL      Allens test (pass/fail) PASS  PASS     Dg Chest 2 View  09/18/2012  *RADIOLOGY REPORT*  Clinical Data: Shortness of breath, cough, COPD  CHEST - 2 VIEW  Comparison: 06/13/2012  Findings: Chronic interstitial markings/hyperinflation.  No focal consolidation. No pleural effusion or pneumothorax.  The heart is normal in size.  Stable right hilar prominence, likely vascular.  Visualized osseous structures are within normal limits.  Surgical clips in the right neck.  IMPRESSION: No evidence of acute cardiopulmonary disease.   Original Report Authenticated By: Charline Bills, M.D.         Connor Armstrong A. Gerilyn Armstrong, M.D.  Diplomate, Biomedical engineer of Psychiatry and Neurology ( Neurology). 09/19/2012, 9:24 AM

## 2012-09-19 NOTE — Care Management Note (Signed)
    Page 1 of 2   10/01/2012     9:32:59 AM   CARE MANAGEMENT NOTE 10/01/2012  Patient:  Connor Armstrong, Connor Armstrong   Account Number:  [id number]  Date Initiated:  09/19/2012  Documentation initiated by:  Sharrie Rothman  Subjective/Objective Assessment:   Pt admitted from home with respiratory failure. Pt lives with his wife and will return home at discharge. Pt uses a cane and has O2 with [facility]. Pt is followed by [facility] and has used [facility] in the past. Pt gets O2 with [facility].     Action/Plan:   Will notify [facility] of pts admission. WIll need resumption of HH at discharge. Will continue to follow.   Anticipated DC Date:  09/23/2012   Anticipated DC Plan:  HOME W HOME HEALTH SERVICES      DC Planning Services  CM consult      [facility] Choice  HOME HEALTH  DURABLE MEDICAL EQUIPMENT   Choice offered to / List presented to:  C-1 Patient   DME arranged  BIPAP  WALKER - ROLLING      DME agency  [facility]     [facility] arranged  HH-1 RN  HH-2 PT  HH-7 RESPIRATORY THERAPY      HH agency  [facility]   Status of service:  Completed, signed off Medicare Important Message given?  YES (If response is "NO", the following Medicare IM given date fields will be blank) Date Medicare IM given:  10/01/2012 Date Additional Medicare IM given:    Discharge Disposition:  HOME W HOME HEALTH SERVICES  Per UR Regulation:    If discussed at Long Length of Stay Meetings, dates discussed:   09/23/2012  09/25/2012  09/30/2012    Comments:  10/01/12 0930 [name], RN BSN CM Pt discharged home today with [facility]. Alroy Bailiff of [facility] is aware and will collect the pts information from the chart. Pt has rolling walker in room. Bipap is being arranged by Tula Nakayama of [facility]. No other CM needs noted. Pt and pts nurse aware of discharge arrangements.   09/30/12 1118 [name], RN BSN CM Pt potential discharge 24-48 hours. Pt chose [facility] for [facility] and DME. Pt is being worked up for bipap at home.  Pt does need rolling walker.  09/25/12 0715 [name], RN BSN CM Pt was intubated on 09/23/12.  09/19/12 1450 [name], RN BSN CM

## 2012-09-19 NOTE — Progress Notes (Signed)
Subjective: He feels better. He's having less shortness of breath. He is still having some myoclonic jerks and is having some pain from muscle spasm that he relates to his fibromyalgia. He's also having some trouble with constipation.  Objective: Vital signs in last 24 hours: Temp:  [97.8 F (36.6 C)-98.5 F (36.9 C)] 97.9 F (36.6 C) (01/10 0400) Pulse Rate:  [96-112] 104  (01/10 0324) Resp:  [14-24] 14  (01/10 0324) BP: (95-169)/(60-134) 115/73 mmHg (01/10 0300) SpO2:  [90 %-100 %] 98 % (01/10 0739) FiO2 (%):  [35 %] 35 % (01/10 0259) Weight:  [65.318 kg (144 lb)-69 kg (152 lb 1.9 oz)] 69 kg (152 lb 1.9 oz) (01/10 0500) Weight change:  Last BM Date: 09/17/12  Intake/Output from previous day: 01/09 0701 - 01/10 0700 In: 240 [P.O.:240] Out: 1450 [Urine:1450]  PHYSICAL EXAM General appearance: alert, cooperative and moderate distress Resp: diminished breath sounds bilaterally Cardio: regular rate and rhythm, S1, S2 normal, no murmur, click, rub or gallop GI: soft, non-tender; bowel sounds normal; no masses,  no organomegaly Extremities: extremities normal, atraumatic, no cyanosis or edema  Lab Results:    Basic Metabolic Panel:  Basename 09/19/12 0452 09/18/12 0823  NA 133* 138  K 4.1 4.9  CL 88* 91*  CO2 40* >45*  GLUCOSE 144* 137*  BUN 23 27*  CREATININE 1.10 1.28  CALCIUM 8.6 9.3  MG -- --  PHOS -- --   Liver Function Tests: No results found for this basename: AST:2,ALT:2,ALKPHOS:2,BILITOT:2,PROT:2,ALBUMIN:2 in the last 72 hours No results found for this basename: LIPASE:2,AMYLASE:2 in the last 72 hours No results found for this basename: AMMONIA:2 in the last 72 hours CBC:  Basename 09/19/12 0452 09/18/12 0823  WBC 10.1 10.1  NEUTROABS -- 8.9*  HGB 12.0* 13.1  HCT 38.0* 44.0  MCV 94.3 100.0  PLT 234 231   Cardiac Enzymes: No results found for this basename: CKTOTAL:3,CKMB:3,CKMBINDEX:3,TROPONINI:3 in the last 72 hours BNP: No results found for this  basename: PROBNP:3 in the last 72 hours D-Dimer: No results found for this basename: DDIMER:2 in the last 72 hours CBG: No results found for this basename: GLUCAP:6 in the last 72 hours Hemoglobin A1C: No results found for this basename: HGBA1C in the last 72 hours Fasting Lipid Panel: No results found for this basename: CHOL,HDL,LDLCALC,TRIG,CHOLHDL,LDLDIRECT in the last 72 hours Thyroid Function Tests: No results found for this basename: TSH,T4TOTAL,FREET4,T3FREE,THYROIDAB in the last 72 hours Anemia Panel: No results found for this basename: VITAMINB12,FOLATE,FERRITIN,TIBC,IRON,RETICCTPCT in the last 72 hours Coagulation: No results found for this basename: LABPROT:2,INR:2 in the last 72 hours Urine Drug Screen: Drugs of Abuse  No results found for this basename: labopia, cocainscrnur, labbenz, amphetmu, thcu, labbarb    Alcohol Level: No results found for this basename: ETH:2 in the last 72 hours Urinalysis: No results found for this basename: COLORURINE:2,APPERANCEUR:2,LABSPEC:2,PHURINE:2,GLUCOSEU:2,HGBUR:2,BILIRUBINUR:2,KETONESUR:2,PROTEINUR:2,UROBILINOGEN:2,NITRITE:2,LEUKOCYTESUR:2 in the last 72 hours Misc. Labs:  ABGS  Basename 09/19/12 0508  PHART 7.386  PO2ART 63.0*  TCO2 38.4  HCO3 42.2*   CULTURES Recent Results (from the past 240 hour(s))  MRSA PCR SCREENING     Status: Normal   Collection Time   09/18/12  2:55 PM      Component Value Range Status Comment   MRSA by PCR NEGATIVE  NEGATIVE Final    Studies/Results: Dg Chest 2 View  09/18/2012  *RADIOLOGY REPORT*  Clinical Data: Shortness of breath, cough, COPD  CHEST - 2 VIEW  Comparison: 06/13/2012  Findings: Chronic interstitial markings/hyperinflation.  No focal consolidation.  No pleural effusion or pneumothorax.  The heart is normal in size.  Stable right hilar prominence, likely vascular.  Visualized osseous structures are within normal limits.  Surgical clips in the right neck.  IMPRESSION: No evidence of  acute cardiopulmonary disease.   Original Report Authenticated By: Charline Bills, M.D.     Medications:  Prior to Admission:  Prescriptions prior to admission  Medication Sig Dispense Refill  . acetaminophen (TYLENOL) 500 MG tablet Take 1,000 mg by mouth every 6 (six) hours as needed. For pain       . albuterol (PROVENTIL HFA;VENTOLIN HFA) 108 (90 BASE) MCG/ACT inhaler Inhale 2 puffs into the lungs every 6 (six) hours as needed. For shortness of breath       . albuterol (PROVENTIL) (2.5 MG/3ML) 0.083% nebulizer solution Take 3 mLs (2.5 mg total) by nebulization every 6 (six) hours as needed for wheezing.  75 mL  12  . aspirin 81 MG chewable tablet Chew 81 mg by mouth daily.      . cyclobenzaprine (FLEXERIL) 10 MG tablet Take 10 mg by mouth 2 (two) times daily.        Marland Kitchen dextromethorphan-guaiFENesin (MUCINEX DM) 30-600 MG per 12 hr tablet Take 1 tablet by mouth every 12 (twelve) hours as needed. For congestion      . finasteride (PROSCAR) 5 MG tablet Take 5 mg by mouth daily.      . Fluticasone-Salmeterol (ADVAIR) 250-50 MCG/DOSE AEPB Inhale 1 puff into the lungs every 12 (twelve) hours.        . gabapentin (NEURONTIN) 300 MG capsule Take 300 mg by mouth at bedtime.      Marland Kitchen HYDROcodone-acetaminophen (NORCO) 5-325 MG per tablet Take 1 tablet by mouth every 6 (six) hours as needed. For shingles pain      . lisinopril-hydrochlorothiazide (PRINZIDE,ZESTORETIC) 20-12.5 MG per tablet Take 0.5 tablets by mouth daily.        . Milnacipran (SAVELLA) 50 MG TABS Take 50 mg by mouth 2 (two) times daily.        . roflumilast (DALIRESP) 500 MCG TABS tablet Take 1 tablet (500 mcg total) by mouth daily.  30 tablet  12  . tadalafil (CIALIS) 5 MG tablet Take 5 mg by mouth daily.       Marland Kitchen tiotropium (SPIRIVA) 18 MCG inhalation capsule Place 18 mcg into inhaler and inhale daily.         Scheduled:   . albuterol  2.5 mg Nebulization Q4H  . aspirin  81 mg Oral Daily  . cyclobenzaprine  10 mg Oral TID  .  enoxaparin (LOVENOX) injection  40 mg Subcutaneous Q24H  . finasteride  5 mg Oral Daily  . gabapentin  300 mg Oral QHS  . hydrochlorothiazide  12.5 mg Oral Daily  . ipratropium  0.5 mg Nebulization Q4H  . levofloxacin  750 mg Intravenous Q24H  . lisinopril  10 mg Oral Daily  . methylPREDNISolone (SOLU-MEDROL) injection  125 mg Intravenous Q6H  . Milnacipran  50 mg Oral BID  . mometasone-formoterol  2 puff Inhalation BID  . roflumilast  500 mcg Oral Daily   Continuous:   . sodium chloride 1,000 mL (09/19/12 0213)  . albuterol 10 mg/hr (09/18/12 1049)   HYQ:[id number], acetaminophen, albuterol, alum & mag hydroxide-simeth, HYDROcodone-acetaminophen, morphine injection, ondansetron (ZOFRAN) IV, ondansetron  Assesment: He has acute on chronic respiratory failure. He did not have influenza. He has fibromyalgia which I think is somewhat worse with his flareup from his COPD. He has  severe end-stage COPD. He is been having myoclonic jerks which may be related to his elevated PCO2. Active Problems:  * No active hospital problems. *     Plan: He has been started on Flexeril which seems to have helped. He needs medication for pain. He may need BiPAP at home. I will transfer him from ICU to step down. He will continue other treatments. I have requested neurological consultation because of the myoclonic jerks    LOS: 1 day   Anis Cinelli L 09/19/2012, 7:55 AM

## 2012-09-19 NOTE — Progress Notes (Signed)
UR chart review completed.  

## 2012-09-20 MED ORDER — BISACODYL 10 MG RE SUPP
10.0000 mg | Freq: Every day | RECTAL | Status: DC | PRN
  Administered 2012-09-22 – 2012-09-25 (×2): 10 mg via RECTAL
  Filled 2012-09-20 (×3): qty 1

## 2012-09-20 MED ORDER — FLEET ENEMA 7-19 GM/118ML RE ENEM
1.0000 | ENEMA | Freq: Every day | RECTAL | Status: DC | PRN
  Administered 2012-09-20 – 2012-09-26 (×3): 1 via RECTAL

## 2012-09-20 NOTE — Progress Notes (Signed)
Subjective: He feels better. He is still short of breath. He is still constipated. He is having muscle spasm Objective: Vital signs in last 24 hours: Temp:  [97.7 F (36.5 C)-98.5 F (36.9 C)] 98.3 F (36.8 C) (01/11 1200) Pulse Rate:  [86-114] 96  (01/11 1000) Resp:  [14-26] 17  (01/11 1000) BP: (96-124)/(52-80) 120/72 mmHg (01/11 1000) SpO2:  [89 %-100 %] 96 % (01/11 1107) FiO2 (%):  [35 %] 35 % (01/11 0442) Weight:  [72 kg (158 lb 11.7 oz)] 72 kg (158 lb 11.7 oz) (01/11 0400) Weight change: 6.682 kg (14 lb 11.7 oz) Last BM Date: 09/17/12  Intake/Output from previous day: 01/10 0701 - 01/11 0700 In: 4590 [P.O.:1440; I.V.:3000; IV Piggyback:150] Out: 3450 [Urine:3450]  PHYSICAL EXAM General appearance: alert, cooperative and mild distress Resp: diminished breath sounds bilaterally Cardio: regular rate and rhythm, S1, S2 normal, no murmur, click, rub or gallop GI: soft, non-tender; bowel sounds normal; no masses,  no organomegaly Extremities: extremities normal, atraumatic, no cyanosis or edema  Lab Results:    Basic Metabolic Panel:  Basename 09/19/12 0452 09/18/12 0823  NA 133* 138  K 4.1 4.9  CL 88* 91*  CO2 40* >45*  GLUCOSE 144* 137*  BUN 23 27*  CREATININE 1.10 1.28  CALCIUM 8.6 9.3  MG 2.2 --  PHOS -- --   Liver Function Tests: No results found for this basename: AST:2,ALT:2,ALKPHOS:2,BILITOT:2,PROT:2,ALBUMIN:2 in the last 72 hours No results found for this basename: LIPASE:2,AMYLASE:2 in the last 72 hours No results found for this basename: AMMONIA:2 in the last 72 hours CBC:  Basename 09/19/12 0452 09/18/12 0823  WBC 10.1 10.1  NEUTROABS -- 8.9*  HGB 12.0* 13.1  HCT 38.0* 44.0  MCV 94.3 100.0  PLT 234 231   Cardiac Enzymes: No results found for this basename: CKTOTAL:3,CKMB:3,CKMBINDEX:3,TROPONINI:3 in the last 72 hours BNP: No results found for this basename: PROBNP:3 in the last 72 hours D-Dimer: No results found for this basename: DDIMER:2  in the last 72 hours CBG: No results found for this basename: GLUCAP:6 in the last 72 hours Hemoglobin A1C: No results found for this basename: HGBA1C in the last 72 hours Fasting Lipid Panel: No results found for this basename: CHOL,HDL,LDLCALC,TRIG,CHOLHDL,LDLDIRECT in the last 72 hours Thyroid Function Tests:  Basename 09/19/12 0840  TSH 0.926  T4TOTAL --  FREET4 --  T3FREE --  THYROIDAB --   Anemia Panel:  Basename 09/19/12 0840  VITAMINB12 487  FOLATE --  FERRITIN 11*  TIBC --  IRON --  RETICCTPCT --   Coagulation: No results found for this basename: LABPROT:2,INR:2 in the last 72 hours Urine Drug Screen: Drugs of Abuse  No results found for this basename: labopia, cocainscrnur, labbenz, amphetmu, thcu, labbarb    Alcohol Level: No results found for this basename: ETH:2 in the last 72 hours Urinalysis: No results found for this basename: COLORURINE:2,APPERANCEUR:2,LABSPEC:2,PHURINE:2,GLUCOSEU:2,HGBUR:2,BILIRUBINUR:2,KETONESUR:2,PROTEINUR:2,UROBILINOGEN:2,NITRITE:2,LEUKOCYTESUR:2 in the last 72 hours Misc. Labs:  ABGS  Basename 09/19/12 0508  PHART 7.386  PO2ART 63.0*  TCO2 38.4  HCO3 42.2*   CULTURES Recent Results (from the past 240 hour(s))  MRSA PCR SCREENING     Status: Normal   Collection Time   09/18/12  2:55 PM      Component Value Range Status Comment   MRSA by PCR NEGATIVE  NEGATIVE Final    Studies/Results: No results found.  Medications:  Prior to Admission:  Prescriptions prior to admission  Medication Sig Dispense Refill  . acetaminophen (TYLENOL) 500 MG tablet Take 1,000  mg by mouth every 6 (six) hours as needed. For pain       . albuterol (PROVENTIL HFA;VENTOLIN HFA) 108 (90 BASE) MCG/ACT inhaler Inhale 2 puffs into the lungs every 6 (six) hours as needed. For shortness of breath       . albuterol (PROVENTIL) (2.5 MG/3ML) 0.083% nebulizer solution Take 3 mLs (2.5 mg total) by nebulization every 6 (six) hours as needed for wheezing.  75  mL  12  . aspirin 81 MG chewable tablet Chew 81 mg by mouth daily.      . cyclobenzaprine (FLEXERIL) 10 MG tablet Take 10 mg by mouth 2 (two) times daily.        Marland Kitchen dextromethorphan-guaiFENesin (MUCINEX DM) 30-600 MG per 12 hr tablet Take 1 tablet by mouth every 12 (twelve) hours as needed. For congestion      . finasteride (PROSCAR) 5 MG tablet Take 5 mg by mouth daily.      . Fluticasone-Salmeterol (ADVAIR) 250-50 MCG/DOSE AEPB Inhale 1 puff into the lungs every 12 (twelve) hours.        . gabapentin (NEURONTIN) 300 MG capsule Take 300 mg by mouth at bedtime.      Marland Kitchen HYDROcodone-acetaminophen (NORCO) 5-325 MG per tablet Take 1 tablet by mouth every 6 (six) hours as needed. For shingles pain      . lisinopril-hydrochlorothiazide (PRINZIDE,ZESTORETIC) 20-12.5 MG per tablet Take 0.5 tablets by mouth daily.        . Milnacipran (SAVELLA) 50 MG TABS Take 50 mg by mouth 2 (two) times daily.        . roflumilast (DALIRESP) 500 MCG TABS tablet Take 1 tablet (500 mcg total) by mouth daily.  30 tablet  12  . tadalafil (CIALIS) 5 MG tablet Take 5 mg by mouth daily.       Marland Kitchen tiotropium (SPIRIVA) 18 MCG inhalation capsule Place 18 mcg into inhaler and inhale daily.         Scheduled:   . albuterol  2.5 mg Nebulization Q4H  . aspirin  81 mg Oral Daily  . cyclobenzaprine  10 mg Oral QID  . docusate sodium  100 mg Oral BID  . enoxaparin (LOVENOX) injection  40 mg Subcutaneous Q24H  . feeding supplement  1 Container Oral TID BM  . finasteride  5 mg Oral Daily  . gabapentin  600 mg Oral QHS  . hydrochlorothiazide  12.5 mg Oral Daily  . ipratropium  0.5 mg Nebulization Q4H  . levofloxacin  750 mg Intravenous Q24H  . lisinopril  10 mg Oral Daily  . methylPREDNISolone (SOLU-MEDROL) injection  125 mg Intravenous Q6H  . Milnacipran  50 mg Oral BID  . mometasone-formoterol  2 puff Inhalation BID  . polyethylene glycol  17 g Oral Daily  . roflumilast  500 mcg Oral Daily   Continuous:   . sodium chloride  125 mL/hr at 09/20/12 1105  . albuterol 10 mg/hr (09/18/12 1049)   NFA:OZHYQMVHQIONG, acetaminophen, albuterol, alum & mag hydroxide-simeth, bisacodyl, HYDROcodone-acetaminophen, morphine injection, ondansetron (ZOFRAN) IV, ondansetron, sodium phosphate  Assesment:he has acute on chronic respiratory failure and severe COPD. He is having muscle spasms and myoclonus. He had EEG which is pending Principal Problem:  *Acute-on-chronic respiratory failure Active Problems:  Acute exacerbation of chronic obstructive pulmonary disease (COPD)  Hypertension  Fibromyalgia  Myoclonus    Plan:no change in medication. He will stay in step down since he is still using BiPAP.    LOS: 2 days   Gaither Biehn L 09/20/2012, 2:33  PM

## 2012-09-20 NOTE — Progress Notes (Signed)
TREMORS OF ALL FOUR EXTREMITIES HAS INCREASED. PT IS NOW ALSO HAVING EPISODES OF SEVERE RAMPING IN HEAD AND NECK THAT FRIGHTEN HIM PAIN MEDICATION AND MUCH EMOTIONAL SUPPORT GIVEN WHEN HE HAS THESE EPISODES.

## 2012-09-21 MED ORDER — LORAZEPAM 2 MG/ML IJ SOLN
INTRAMUSCULAR | Status: AC
  Filled 2012-09-21: qty 1

## 2012-09-21 MED ORDER — LORAZEPAM 2 MG/ML IJ SOLN: 0.5000 mg | Freq: Four times a day (QID) | INTRAMUSCULAR | Status: DC | PRN

## 2012-09-21 MED ORDER — SODIUM CHLORIDE 0.9 % IJ SOLN
10.0000 mL | INTRAMUSCULAR | Status: DC | PRN
  Administered 2012-09-21 – 2012-09-25 (×2): 10 mL via INTRAVENOUS

## 2012-09-21 MED ORDER — GUAIFENESIN ER 600 MG PO TB12
600.0000 mg | ORAL_TABLET | Freq: Two times a day (BID) | ORAL | Status: DC
  Administered 2012-09-21 – 2012-09-24 (×5): 600 mg via ORAL
  Filled 2012-09-21 (×5): qty 1

## 2012-09-21 MED ORDER — LORAZEPAM 2 MG/ML IJ SOLN
1.0000 mg | Freq: Four times a day (QID) | INTRAMUSCULAR | Status: DC | PRN
  Administered 2012-09-21 (×2): 1 mg via INTRAVENOUS
  Filled 2012-09-21 (×2): qty 1

## 2012-09-21 NOTE — Progress Notes (Signed)
Patient anxious and tearful, d/t tremors. Order received for ativan 1mg  iv QID PRN. 1st dose given, results pending.

## 2012-09-21 NOTE — Progress Notes (Signed)
Subjective: He has some Ativan this morning so he is a little sleepy and mildly confused. He has no new complaints. He says his bowel still did not move. He continues to have trouble with pain  in diffuse areas. He use BiPAP most of the night last night  Objective: Vital signs in last 24 hours: Temp:  [97.5 F (36.4 C)-98.3 F (36.8 C)] 97.8 F (36.6 C) (01/12 0400) Pulse Rate:  [86-124] 96  (01/12 0500) Resp:  [14-23] 19  (01/12 0002) BP: (92-172)/(48-129) 105/91 mmHg (01/12 0500) SpO2:  [91 %-100 %] 91 % (01/12 0718) Weight:  [73.1 kg (161 lb 2.5 oz)] 73.1 kg (161 lb 2.5 oz) (01/12 0500) Weight change: 1.1 kg (2 lb 6.8 oz) Last BM Date: 09/17/12  Intake/Output from previous day: 01/11 0701 - 01/12 0700 In: 4695 [P.O.:1920; I.V.:2625; IV Piggyback:150] Out: 3550 [Urine:3550]  PHYSICAL EXAM General appearance: mild distress and Mildly sluggish and confused Resp: He has bilateral end expiratory wheezes Cardio: regular rate and rhythm, S1, S2 normal, no murmur, click, rub or gallop GI: soft, non-tender; bowel sounds normal; no masses,  no organomegaly Extremities: extremities normal, atraumatic, no cyanosis or edema  Lab Results:    Basic Metabolic Panel:  Basename 09/19/12 0452 09/18/12 0823  NA 133* 138  K 4.1 4.9  CL 88* 91*  CO2 40* >45*  GLUCOSE 144* 137*  BUN 23 27*  CREATININE 1.10 1.28  CALCIUM 8.6 9.3  MG 2.2 --  PHOS -- --   Liver Function Tests: No results found for this basename: AST:2,ALT:2,ALKPHOS:2,BILITOT:2,PROT:2,ALBUMIN:2 in the last 72 hours No results found for this basename: LIPASE:2,AMYLASE:2 in the last 72 hours No results found for this basename: AMMONIA:2 in the last 72 hours CBC:  Basename 09/19/12 0452 09/18/12 0823  WBC 10.1 10.1  NEUTROABS -- 8.9*  HGB 12.0* 13.1  HCT 38.0* 44.0  MCV 94.3 100.0  PLT 234 231   Cardiac Enzymes: No results found for this basename: CKTOTAL:3,CKMB:3,CKMBINDEX:3,TROPONINI:3 in the last 72  hours BNP: No results found for this basename: PROBNP:3 in the last 72 hours D-Dimer: No results found for this basename: DDIMER:2 in the last 72 hours CBG: No results found for this basename: GLUCAP:6 in the last 72 hours Hemoglobin A1C: No results found for this basename: HGBA1C in the last 72 hours Fasting Lipid Panel: No results found for this basename: CHOL,HDL,LDLCALC,TRIG,CHOLHDL,LDLDIRECT in the last 72 hours Thyroid Function Tests:  Basename 09/19/12 0840  TSH 0.926  T4TOTAL --  FREET4 --  T3FREE --  THYROIDAB --   Anemia Panel:  Basename 09/19/12 0840  VITAMINB12 487  FOLATE --  FERRITIN 11*  TIBC --  IRON --  RETICCTPCT --   Coagulation: No results found for this basename: LABPROT:2,INR:2 in the last 72 hours Urine Drug Screen: Drugs of Abuse  No results found for this basename: labopia, cocainscrnur, labbenz, amphetmu, thcu, labbarb    Alcohol Level: No results found for this basename: ETH:2 in the last 72 hours Urinalysis: No results found for this basename: COLORURINE:2,APPERANCEUR:2,LABSPEC:2,PHURINE:2,GLUCOSEU:2,HGBUR:2,BILIRUBINUR:2,KETONESUR:2,PROTEINUR:2,UROBILINOGEN:2,NITRITE:2,LEUKOCYTESUR:2 in the last 72 hours Misc. Labs:  ABGS  Basename 09/19/12 0508  PHART 7.386  PO2ART 63.0*  TCO2 38.4  HCO3 42.2*   CULTURES Recent Results (from the past 240 hour(s))  MRSA PCR SCREENING     Status: Normal   Collection Time   09/18/12  2:55 PM      Component Value Range Status Comment   MRSA by PCR NEGATIVE  NEGATIVE Final    Studies/Results: No results  found.  Medications:  Prior to Admission:  Prescriptions prior to admission  Medication Sig Dispense Refill  . acetaminophen (TYLENOL) 500 MG tablet Take 1,000 mg by mouth every 6 (six) hours as needed. For pain       . albuterol (PROVENTIL HFA;VENTOLIN HFA) 108 (90 BASE) MCG/ACT inhaler Inhale 2 puffs into the lungs every 6 (six) hours as needed. For shortness of breath       . albuterol  (PROVENTIL) (2.5 MG/3ML) 0.083% nebulizer solution Take 3 mLs (2.5 mg total) by nebulization every 6 (six) hours as needed for wheezing.  75 mL  12  . aspirin 81 MG chewable tablet Chew 81 mg by mouth daily.      . cyclobenzaprine (FLEXERIL) 10 MG tablet Take 10 mg by mouth 2 (two) times daily.        Marland Kitchen dextromethorphan-guaiFENesin (MUCINEX DM) 30-600 MG per 12 hr tablet Take 1 tablet by mouth every 12 (twelve) hours as needed. For congestion      . finasteride (PROSCAR) 5 MG tablet Take 5 mg by mouth daily.      . Fluticasone-Salmeterol (ADVAIR) 250-50 MCG/DOSE AEPB Inhale 1 puff into the lungs every 12 (twelve) hours.        . gabapentin (NEURONTIN) 300 MG capsule Take 300 mg by mouth at bedtime.      Marland Kitchen HYDROcodone-acetaminophen (NORCO) 5-325 MG per tablet Take 1 tablet by mouth every 6 (six) hours as needed. For shingles pain      . lisinopril-hydrochlorothiazide (PRINZIDE,ZESTORETIC) 20-12.5 MG per tablet Take 0.5 tablets by mouth daily.        . Milnacipran (SAVELLA) 50 MG TABS Take 50 mg by mouth 2 (two) times daily.        . roflumilast (DALIRESP) 500 MCG TABS tablet Take 1 tablet (500 mcg total) by mouth daily.  30 tablet  12  . tadalafil (CIALIS) 5 MG tablet Take 5 mg by mouth daily.       Marland Kitchen tiotropium (SPIRIVA) 18 MCG inhalation capsule Place 18 mcg into inhaler and inhale daily.         Scheduled:   . albuterol  2.5 mg Nebulization Q4H  . aspirin  81 mg Oral Daily  . cyclobenzaprine  10 mg Oral QID  . docusate sodium  100 mg Oral BID  . enoxaparin (LOVENOX) injection  40 mg Subcutaneous Q24H  . feeding supplement  1 Container Oral TID BM  . finasteride  5 mg Oral Daily  . gabapentin  600 mg Oral QHS  . hydrochlorothiazide  12.5 mg Oral Daily  . ipratropium  0.5 mg Nebulization Q4H  . levofloxacin  750 mg Intravenous Q24H  . lisinopril  10 mg Oral Daily  . LORazepam      . methylPREDNISolone (SOLU-MEDROL) injection  125 mg Intravenous Q6H  . Milnacipran  50 mg Oral BID  .  mometasone-formoterol  2 puff Inhalation BID  . polyethylene glycol  17 g Oral Daily  . roflumilast  500 mcg Oral Daily   Continuous:   . sodium chloride 125 mL/hr at 09/21/12 0500  . albuterol 10 mg/hr (09/18/12 1049)   NFA:OZHYQMVHQIONG, acetaminophen, albuterol, alum & mag hydroxide-simeth, bisacodyl, HYDROcodone-acetaminophen, LORazepam, morphine injection, ondansetron (ZOFRAN) IV, ondansetron, sodium phosphate  Assesment: He has acute on chronic respiratory failure and is still requiring BiPAP. He has constipation and is still not able to have a good bowel movement. His ferritin level was low which may make things worse as far as his presumed restless  leg syndrome. He has severe fibromyalgia with multiple complaints from that. He has prostate problems but is doing pretty well as far as that's concerned. He has hypertension which is well controlled Principal Problem:  *Acute-on-chronic respiratory failure Active Problems:  Acute exacerbation of chronic obstructive pulmonary disease (COPD)  Hypertension  Fibromyalgia  Myoclonus    Plan: No change in treatments I'm reluctant to start iron yet in 2 weeks and get his bowels to move.    LOS: 3 days   Connor Armstrong L 09/21/2012, 7:34 AM

## 2012-09-21 NOTE — Progress Notes (Signed)
Patient remains tremulous, especially when trying to perform tasks, such as holding a cup or ambulating. Patient able to ambulate with two staff members assisting him. This am patient is complaining of tic-like movement of chin and eyelids which he is unable to stop. He is able to hold a conversation, and is oriented, but very anxious. Patient given PRN vicodin for generalized pain.

## 2012-09-21 NOTE — Progress Notes (Signed)
Patient resting comfortably at this time.

## 2012-09-22 LAB — CK: Total CK: 111 U/L (ref 7–232)

## 2012-09-22 MED ORDER — METHYLPREDNISOLONE SODIUM SUCC 40 MG IJ SOLR
40.0000 mg | Freq: Four times a day (QID) | INTRAMUSCULAR | Status: DC
  Administered 2012-09-22 – 2012-09-23 (×5): 40 mg via INTRAVENOUS
  Filled 2012-09-22 (×5): qty 1

## 2012-09-22 MED ORDER — GABAPENTIN 300 MG PO CAPS
600.0000 mg | ORAL_CAPSULE | Freq: Three times a day (TID) | ORAL | Status: DC
  Administered 2012-09-23 (×2): 600 mg via ORAL
  Filled 2012-09-22 (×2): qty 2

## 2012-09-22 MED ORDER — LEVOFLOXACIN 750 MG PO TABS
750.0000 mg | ORAL_TABLET | Freq: Every day | ORAL | Status: DC
  Administered 2012-09-22 – 2012-09-23 (×2): 750 mg via ORAL
  Filled 2012-09-22 (×2): qty 1

## 2012-09-22 MED ORDER — CLONAZEPAM 0.5 MG PO TABS
0.5000 mg | ORAL_TABLET | Freq: Every day | ORAL | Status: DC
  Administered 2012-09-22: 0.5 mg via ORAL
  Filled 2012-09-22: qty 1

## 2012-09-22 NOTE — Progress Notes (Signed)
UR Chart Review Completed  

## 2012-09-22 NOTE — Evaluation (Signed)
Physical Therapy Evaluation Patient Details Name: Connor Armstrong MRN: [id number] DOB: 1951/02/26 Today's Date: 09/22/2012 Time: 9629-5284 PT Time Calculation (min): 25 min  PT Assessment / Plan / Recommendation Clinical Impression  Pt with decreased strength and decreased safety in ambulation who will benefit from skilled PT to achieve maximal functional potential    PT Assessment  Patient needs continued PT services    Follow Up Recommendations  Home health PT    Does the patient have the potential to tolerate intense rehabilitation    no  Barriers to Discharge None      Equipment Recommendations  Rolling walker with 5" wheels    Recommendations for Other Services OT consult   Frequency Min 3X/week    Precautions / Restrictions Precautions Precautions: Fall         Mobility  Transfers Transfers: Sit to Stand Sit to Stand: 5: Supervision Ambulation/Gait Ambulation/Gait Assistance: 4: Min guard Ambulation Distance (Feet): 80 Feet Assistive device: Rolling walker Ambulation/Gait Assistance Details: Pt needed one 30 second rest break with walking.  Pt tremors continued with walking but able to ambulate with the use of RW Gait Pattern: Step-through pattern;Decreased step length - right;Decreased step length - left Gait velocity: slow; pt gt more steady when pt took longer steps as encouraged. Stairs: No           PT Diagnosis: Difficulty walking;Generalized weakness  PT Problem List: Decreased strength;Decreased activity tolerance;Decreased mobility;Decreased balance;Pain PT Treatment Interventions: Gait training;Therapeutic exercise   PT Goals Acute Rehab PT Goals PT Goal Formulation: With patient Time For Goal Achievement: 09/26/12 Potential to Achieve Goals: Good Pt will go Supine/Side to Sit: with modified independence PT Goal: Supine/Side to Sit - Progress: Goal set today Pt will go Sit to Supine/Side: with modified independence PT Goal: Sit to  Supine/Side - Progress: Goal set today Pt will Stand: with modified independence PT Goal: Stand - Progress: Goal set today Pt will Ambulate: >150 feet;with modified independence;with least restrictive assistive device  Visit Information  Last PT Received On: 09/22/12 Assistance Needed: +1    Subjective Data  Subjective: Pt states that he has not been this shaky or weak ever.  States he is use to tremblling but no this bad.  Pt normally ambulates without an assistive device but does have a cane at home. Patient Stated Goal: To be able to walk like normal   Prior Functioning  Home Living Lives With: Spouse Available Help at Discharge: Family Type of Home: House Home Access: Stairs to enter Secretary/administrator of Steps: 5 Entrance Stairs-Rails: Right    Cognition   WNL    Extremity/Trunk Assessment Right Lower Extremity Assessment RLE ROM/Strength/Tone: [facility] for tasks assessed Left Lower Extremity Assessment LLE ROM/Strength/Tone: WFL for tasks assessed       End of Session PT - End of Session Equipment Utilized During Treatment: Gait belt Activity Tolerance: Patient tolerated treatment well Patient left: in chair;with call bell/phone within reach Nurse Communication: Mobility status  GP     RUSSELL,CINDY 09/22/2012, 2:44 PM

## 2012-09-22 NOTE — Progress Notes (Signed)
PHARMACIST - PHYSICIAN COMMUNICATION DR:   Juanetta Gosling CONCERNING: Antibiotic IV to Oral Route Change Policy  RECOMMENDATION: This patient is receiving Levaquin by the intravenous route.  Based on criteria approved by the Pharmacy and Therapeutics Committee, the antibiotic(s) is/are being converted to the equivalent oral dose form(s).   DESCRIPTION: These criteria include:  Patient being treated for a respiratory tract infection, urinary tract infection, or cellulitis  The patient is not neutropenic and does not exhibit a GI malabsorption state  The patient is eating (either orally or via tube) and/or has been taking other orally administered medications for a least 24 hours  The patient is improving clinically and has a Tmax < 100.5  If you have questions about this conversion, please contact the Pharmacy Department  [x]   [phone] )  [facility] []   [phone] )  [facility]  []   [phone] )  [facility] []   [phone] )  Ilene Qua  Junita Push, PharmD, BCPS

## 2012-09-22 NOTE — Progress Notes (Signed)
Patient ID: Connor Armstrong, male   DOB: [birth year], 62 y.o.   MRN: [id number]  [facility] Karin Pinedo A. Gerilyn Pilgrim, MD     [email or web address]          Connor Armstrong is an 62 y.o. male.   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 The patient also symptoms developed myoclonic jerks. He seems not to have done well with the current regimen. Therefore the Neurontin will be increased. We will reluctantly add clonazepam given the severity of his symptoms.  The patient now states that he is having the myoclonic jerks both day and night. He reports that they are occurring quite frequently especially in the hospital. He also continues to have the cramping of his muscles. The Flexeril does help the cramping but he still is having them and would like additional treatments. It appears that the patient was on Savella for the last few months which helps his pain but there is some dispute if the medication may have worsened to his myoclonic jerks. He really can't give me a straight answer on this. So far the changes made to his medications have not been helpful over the last few days.  GENERAL: The patient is in discomfort but in no acute distress. He continues to be tremulous but much less so today.  HEENT: Supple. Atraumatic normocephalic.   ABDOMEN: soft  EXTREMITIES: No edema   BACK: Normal.  SKIN: Normal by inspection.    MENTAL STATUS: Alert and oriented. Speech, language and cognition are generally intact. Judgment and insight normal.   MOTOR: Normal tone, bulk and strength; no pronator drift.  COORDINATION: He does have a few myoclonic jerks. Again, he has less tremors and shakes. No dysmetria is noted.     Objective: Vital signs in last 24 hours: Temp:  [97.8 F (36.6 C)-98.6 F (37 C)] 98.4 F (36.9 C) (01/13 1200) Pulse Rate:  [88-121] 102  (01/13 1000) Resp:  [17-26] 21  (01/13 1900) BP: (95-138)/(64-79) 95/66 mmHg (01/13  1900) SpO2:  [77 %-100 %] 97 % (01/13 1934) FiO2 (%):  [35 %] 35 % (01/13 0429) Weight:  [77.4 kg (170 lb 10.2 oz)] 77.4 kg (170 lb 10.2 oz) (01/13 0500)  Intake/Output from previous day: 01/12 0701 - 01/13 0700 In: 3100 [P.O.:75; I.V.:2875; IV Piggyback:150] Out: 2150 [Urine:2150] Intake/Output this shift:   Nutritional status: General   Lab Results: No results found for this or any previous visit (from the past 48 hour(s)).  Lipid Panel No results found for this basename: CHOL,TRIG,HDL,CHOLHDL,VLDL,LDLCALC in the last 72 hours  Studies/Results: No results found.  Medications:  Scheduled Meds:   . albuterol  2.5 mg Nebulization Q4H  . aspirin  81 mg Oral Daily  . cyclobenzaprine  10 mg Oral QID  . docusate sodium  100 mg Oral BID  . enoxaparin (LOVENOX) injection  40 mg Subcutaneous Q24H  . feeding supplement  1 Container Oral TID BM  . finasteride  5 mg Oral Daily  . gabapentin  600 mg Oral QHS  . guaiFENesin  600 mg Oral BID  . hydrochlorothiazide  12.5 mg Oral Daily  . ipratropium  0.5 mg Nebulization Q4H  . levofloxacin  750 mg Oral Daily  . lisinopril  10 mg Oral Daily  . methylPREDNISolone (SOLU-MEDROL) injection  40 mg Intravenous Q6H  . Milnacipran  50 mg Oral BID  . mometasone-formoterol  2 puff Inhalation BID  . polyethylene glycol  17 g Oral Daily  . roflumilast  500  mcg Oral Daily   Continuous Infusions:   . sodium chloride 125 mL/hr at 09/22/12 1800  . albuterol 10 mg/hr (09/18/12 1049)   PRN Meds:.acetaminophen, acetaminophen, albuterol, alum & mag hydroxide-simeth, bisacodyl, HYDROcodone-acetaminophen, LORazepam, morphine injection, ondansetron (ZOFRAN) IV, ondansetron, sodium chloride, sodium phosphate     LOS: 4 days   Derl Abalos A. Gerilyn Pilgrim, M.D.  Diplomate, Biomedical engineer of Psychiatry and Neurology ( Neurology).

## 2012-09-22 NOTE — Procedures (Signed)
[facility] Tomio Kirk A. Gerilyn Pilgrim, MD     [email or web address]        NAMELUISCARLOS, KACZMARCZYK             ACCOUNT NO.:  [id number]  MEDICAL RECORD NO.:  [id number]  LOCATION:  [facility]                         FACILITY:  [facility]  PHYSICIAN:  Annitta Fifield A. Gerilyn Pilgrim, M.D. DATE OF BIRTH:  10/24/1950  DATE OF PROCEDURE:  09/19/2012 DATE OF DISCHARGE:                             EEG INTERPRETATION   INDICATION:  A 62 year old man who presents with altered mental status and  confusion along with shaking.  The study is being done to evaluate for seizures.  MEDICATIONS:  Acetaminophen, albuterol,  aspirin, Flexeril, dexamethasone, Proscar, gabapentin, lisinopril,  hydrochlorothiazide, Cialis and Spiriva and he is also on Lovenox and methylprednisolone.  ANALYSIS:  A 16-channel recording is conducted for 21 minutes.  There is a well-formed posterior dominant rhythm of 8 Hertz, which attenuates with eye opening.  There is beta activity observed in the frontal areas, awake and some drowsy activities are recorded.  Photic stimulation is carried out without abnormal changes in the background activity.  There is no focal or lateralized slowing.  There is no epileptiform activity observed.  IMPRESSION:  Mild generalized slowing, otherwise unremarkable recording. There is no epileptiform activity observed.     Anitria Andon A. Gerilyn Pilgrim, M.D.     KAD/MEDQ  D:  09/22/2012  T:  09/22/2012  Job:  [id number]

## 2012-09-22 NOTE — Progress Notes (Signed)
Subjective: He is overall about the same. He said his speech doesn't seem right to him. He is having some other episodes of muscle spasm et Karie Soda. His breathing he says is doing some better  Objective: Vital signs in last 24 hours: Temp:  [97.7 F (36.5 C)-98.6 F (37 C)] 98.4 F (36.9 C) (01/13 0400) Pulse Rate:  [88-149] 90  (01/13 0426) Resp:  [16-26] 17  (01/13 0500) BP: (107-133)/(43-90) 123/68 mmHg (01/13 0500) SpO2:  [65 %-100 %] 96 % (01/13 0725) FiO2 (%):  [2 %-35 %] 35 % (01/13 0429) Weight:  [77.4 kg (170 lb 10.2 oz)] 77.4 kg (170 lb 10.2 oz) (01/13 0500) Weight change: 4.3 kg (9 lb 7.7 oz) Last BM Date: 09/17/12  Intake/Output from previous day: 01/12 0701 - 01/13 0700 In: 2975 [P.O.:75; I.V.:2750; IV Piggyback:150] Out: 2150 [Urine:2150]  PHYSICAL EXAM General appearance: alert, cooperative and mild distress Resp: wheezes bilaterally Cardio: regular rate and rhythm, S1, S2 normal, no murmur, click, rub or gallop GI: soft, non-tender; bowel sounds normal; no masses,  no organomegaly Extremities: extremities normal, atraumatic, no cyanosis or edema  Lab Results:    Basic Metabolic Panel: No results found for this basename: NA:2,K:2,CL:2,CO2:2,GLUCOSE:2,BUN:2,CREATININE:2,CALCIUM:2,MG:2,PHOS:2 in the last 72 hours Liver Function Tests: No results found for this basename: AST:2,ALT:2,ALKPHOS:2,BILITOT:2,PROT:2,ALBUMIN:2 in the last 72 hours No results found for this basename: LIPASE:2,AMYLASE:2 in the last 72 hours No results found for this basename: AMMONIA:2 in the last 72 hours CBC: No results found for this basename: WBC:2,NEUTROABS:2,HGB:2,HCT:2,MCV:2,PLT:2 in the last 72 hours Cardiac Enzymes: No results found for this basename: CKTOTAL:3,CKMB:3,CKMBINDEX:3,TROPONINI:3 in the last 72 hours BNP: No results found for this basename: PROBNP:3 in the last 72 hours D-Dimer: No results found for this basename: DDIMER:2 in the last 72 hours CBG: No results  found for this basename: GLUCAP:6 in the last 72 hours Hemoglobin A1C: No results found for this basename: HGBA1C in the last 72 hours Fasting Lipid Panel: No results found for this basename: CHOL,HDL,LDLCALC,TRIG,CHOLHDL,LDLDIRECT in the last 72 hours Thyroid Function Tests:  Basename 09/19/12 0840  TSH 0.926  T4TOTAL --  FREET4 --  T3FREE --  THYROIDAB --   Anemia Panel:  Basename 09/19/12 0840  VITAMINB12 487  FOLATE --  FERRITIN 11*  TIBC --  IRON --  RETICCTPCT --   Coagulation: No results found for this basename: LABPROT:2,INR:2 in the last 72 hours Urine Drug Screen: Drugs of Abuse  No results found for this basename: labopia, cocainscrnur, labbenz, amphetmu, thcu, labbarb    Alcohol Level: No results found for this basename: ETH:2 in the last 72 hours Urinalysis: No results found for this basename: COLORURINE:2,APPERANCEUR:2,LABSPEC:2,PHURINE:2,GLUCOSEU:2,HGBUR:2,BILIRUBINUR:2,KETONESUR:2,PROTEINUR:2,UROBILINOGEN:2,NITRITE:2,LEUKOCYTESUR:2 in the last 72 hours Misc. Labs:  ABGS No results found for this basename: PHART,PCO2,PO2ART,TCO2,HCO3 in the last 72 hours CULTURES Recent Results (from the past 240 hour(s))  MRSA PCR SCREENING     Status: Normal   Collection Time   09/18/12  2:55 PM      Component Value Range Status Comment   MRSA by PCR NEGATIVE  NEGATIVE Final    Studies/Results: No results found.  Medications:  Prior to Admission:  Prescriptions prior to admission  Medication Sig Dispense Refill  . acetaminophen (TYLENOL) 500 MG tablet Take 1,000 mg by mouth every 6 (six) hours as needed. For pain       . albuterol (PROVENTIL HFA;VENTOLIN HFA) 108 (90 BASE) MCG/ACT inhaler Inhale 2 puffs into the lungs every 6 (six) hours as needed. For shortness of breath       .  albuterol (PROVENTIL) (2.5 MG/3ML) 0.083% nebulizer solution Take 3 mLs (2.5 mg total) by nebulization every 6 (six) hours as needed for wheezing.  75 mL  12  . aspirin 81 MG chewable  tablet Chew 81 mg by mouth daily.      . cyclobenzaprine (FLEXERIL) 10 MG tablet Take 10 mg by mouth 2 (two) times daily.        Marland Kitchen dextromethorphan-guaiFENesin (MUCINEX DM) 30-600 MG per 12 hr tablet Take 1 tablet by mouth every 12 (twelve) hours as needed. For congestion      . finasteride (PROSCAR) 5 MG tablet Take 5 mg by mouth daily.      . Fluticasone-Salmeterol (ADVAIR) 250-50 MCG/DOSE AEPB Inhale 1 puff into the lungs every 12 (twelve) hours.        . gabapentin (NEURONTIN) 300 MG capsule Take 300 mg by mouth at bedtime.      Marland Kitchen HYDROcodone-acetaminophen (NORCO) 5-325 MG per tablet Take 1 tablet by mouth every 6 (six) hours as needed. For shingles pain      . lisinopril-hydrochlorothiazide (PRINZIDE,ZESTORETIC) 20-12.5 MG per tablet Take 0.5 tablets by mouth daily.        . Milnacipran (SAVELLA) 50 MG TABS Take 50 mg by mouth 2 (two) times daily.        . roflumilast (DALIRESP) 500 MCG TABS tablet Take 1 tablet (500 mcg total) by mouth daily.  30 tablet  12  . tadalafil (CIALIS) 5 MG tablet Take 5 mg by mouth daily.       Marland Kitchen tiotropium (SPIRIVA) 18 MCG inhalation capsule Place 18 mcg into inhaler and inhale daily.         Scheduled:   . albuterol  2.5 mg Nebulization Q4H  . aspirin  81 mg Oral Daily  . cyclobenzaprine  10 mg Oral QID  . docusate sodium  100 mg Oral BID  . enoxaparin (LOVENOX) injection  40 mg Subcutaneous Q24H  . feeding supplement  1 Container Oral TID BM  . finasteride  5 mg Oral Daily  . gabapentin  600 mg Oral QHS  . guaiFENesin  600 mg Oral BID  . hydrochlorothiazide  12.5 mg Oral Daily  . ipratropium  0.5 mg Nebulization Q4H  . levofloxacin  750 mg Intravenous Q24H  . lisinopril  10 mg Oral Daily  . methylPREDNISolone (SOLU-MEDROL) injection  125 mg Intravenous Q6H  . Milnacipran  50 mg Oral BID  . mometasone-formoterol  2 puff Inhalation BID  . polyethylene glycol  17 g Oral Daily  . roflumilast  500 mcg Oral Daily   Continuous:   . sodium chloride 125  mL/hr at 09/21/12 2007  . albuterol 10 mg/hr (09/18/12 1049)   ZOX:[id number], acetaminophen, albuterol, alum & mag hydroxide-simeth, bisacodyl, HYDROcodone-acetaminophen, LORazepam, morphine injection, ondansetron (ZOFRAN) IV, ondansetron, sodium chloride, sodium phosphate  Assesment: He has acute on chronic respiratory failure and is still using BiPAP. He has fibromyalgia and has been having muscle twitches and spasm. He has severe COPD. Principal Problem:  *Acute-on-chronic respiratory failure Active Problems:  Acute exacerbation of chronic obstructive pulmonary disease (COPD)  Hypertension  Fibromyalgia  Myoclonus    Plan: I will ask for physical therapy to see him and try to get him up. I will discuss with Dr. Gerilyn Pilgrim. Continue his other treatments    LOS: 4 days   Aero Drummonds L 09/22/2012, 7:59 AM

## 2012-09-23 LAB — SEDIMENTATION RATE: Sed Rate: 4 mm/hr (ref 0–16)

## 2012-09-23 MED ORDER — METHYLPREDNISOLONE SODIUM SUCC 40 MG IJ SOLR
40.0000 mg | Freq: Two times a day (BID) | INTRAMUSCULAR | Status: DC
  Administered 2012-09-23 – 2012-09-24 (×2): 40 mg via INTRAVENOUS
  Filled 2012-09-23 (×2): qty 1

## 2012-09-23 NOTE — Progress Notes (Signed)
Physical Therapy Treatment Patient Details Name: Connor Armstrong MRN: [id number] DOB: 06/08/1951 Today's Date: 09/23/2012 Time: 7829-5621 PT Time Calculation (min): 46 min  PT Assessment / Plan / Recommendation Comments on Treatment Session  Tx limited by weakness and fibromyalgia pain. Pt presents with decrease tolerance for gait training this session. Attempted to go to chair after gait training but pt stated that is was too painful on his neck and back. Pt returned to bed at end of session. Pt requires manual assistance with bed exercise and increased assistance with gait.                      Plan  (Continue per PT POC.)    Precautions / Restrictions Restrictions Weight Bearing Restrictions: No       Mobility  Transfers Transfers: Sit to Stand Sit to Stand: 4: Min assist Ambulation/Gait Ambulation/Gait Assistance: 4: Min assist Ambulation Distance (Feet): 56 Feet Assistive device: Rolling walker Ambulation/Gait Assistance Details: Pt limited by fatigue and pain. Pt's gait was slow and labored with multiple standing rest breaks and 2 seated breaks. Gait Pattern: Step-to pattern;Trunk flexed;Decreased stride length Gait velocity: Slow and unsteady. Stairs: No    Exercises General Exercises - Lower Extremity Ankle Circles/Pumps: AROM;Both;10 reps;Supine Quad Sets: AROM;Both;10 reps;Supine Heel Slides: Both;5 reps;AAROM;Supine Hip ABduction/ADduction: AAROM;Both;5 reps;Supine     Visit Information  Last PT Received On: 09/23/12    Subjective Data  Subjective: Pt states that he feels weak.         End of Session PT - End of Session Equipment Utilized During Treatment: Gait belt Activity Tolerance: Patient limited by fatigue;Patient limited by pain Patient left: in bed;with call bell/phone within reach   Seth Bake, PTA 09/23/2012, 9:20 AM

## 2012-09-23 NOTE — Progress Notes (Signed)
Patient wore his bipap for 4hours tonight 10/5 35% sats 100% hr 95 tolerated well

## 2012-09-23 NOTE — Progress Notes (Signed)
Nutrition Follow-up  INTERVENTION: Continue with current nutrition care  NUTRITION DIAGNOSIS: Inadequate oral intake; improving  Goal: Pt to meet >/= 90% of their estimated nutrition needs; met  Monitor:  Diet advancement, Nutrition intake and adequacy, labs and wt changes   62 y.o. male  Admitting Dx: Acute-on-chronic respiratory failure  ASSESSMENT: Pt recent meal intake 75%. He is also receiving oral supplements. Will cont with current nutrition interventions. His current wt is reflective of 100% UBW. No edema noted.   Height: Ht Readings from Last 1 Encounters:  09/18/12 5\' 9"  (1.753 m)    Weight: Wt Readings from Last 1 Encounters:  09/23/12 177 lb 14.6 oz (80.7 kg)    Ideal Body Weight: 160# (72.7 kg)  % Ideal Body Weight: 95%  Wt Readings from Last 10 Encounters:  09/23/12 177 lb 14.6 oz (80.7 kg)  12/13/11 190 lb 4.1 oz (86.3 kg)  09/26/11 162 lb 6.4 oz (73.664 kg)  09/21/11 174 lb 13.2 oz (79.3 kg)    Usual Body Weight: 174# (79kg)  % Usual Body Weight: 87%   BMI:  Body mass index is 26.27 kg/(m^2). Normal range  Estimated Nutritional Needs: Kcal: 1725-2070 Protein: 75-90 gr Fluid: 1 ml/kcal  Skin: no issues noted  Diet Order: General 75% po  EDUCATION NEEDS: -No education needs identified at this time   Intake/Output Summary (Last 24 hours) at 09/23/12 1437 Last data filed at 09/23/12 1400  Gross per 24 hour  Intake   4560 ml  Output   3301 ml  Net   1259 ml    Last BM: 09/22/12  Labs:   Lab 09/19/12 0452 09/18/12 0823  NA 133* 138  K 4.1 4.9  CL 88* 91*  CO2 40* >45*  BUN 23 27*  CREATININE 1.10 1.28  CALCIUM 8.6 9.3  MG 2.2 --  PHOS -- --  GLUCOSE 144* 137*    CBG (last 3)  No results found for this basename: GLUCAP:3 in the last 72 hours  Scheduled Meds:    . albuterol  2.5 mg Nebulization Q4H  . aspirin  81 mg Oral Daily  . clonazePAM  0.5 mg Oral QHS  . cyclobenzaprine  10 mg Oral QID  . docusate sodium   100 mg Oral BID  . enoxaparin (LOVENOX) injection  40 mg Subcutaneous Q24H  . feeding supplement  1 Container Oral TID BM  . finasteride  5 mg Oral Daily  . gabapentin  600 mg Oral TID  . guaiFENesin  600 mg Oral BID  . hydrochlorothiazide  12.5 mg Oral Daily  . ipratropium  0.5 mg Nebulization Q4H  . levofloxacin  750 mg Oral Daily  . lisinopril  10 mg Oral Daily  . methylPREDNISolone (SOLU-MEDROL) injection  40 mg Intravenous Q12H  . Milnacipran  50 mg Oral BID  . mometasone-formoterol  2 puff Inhalation BID  . polyethylene glycol  17 g Oral Daily  . roflumilast  500 mcg Oral Daily    Continuous Infusions:    . sodium chloride 125 mL/hr at 09/23/12 1400  . albuterol 10 mg/hr (09/18/12 1049)    Past Medical History  Diagnosis Date  . COPD (chronic obstructive pulmonary disease)   . Hypertension   . Fibromyalgia   . Shingles   . Cancer     Past Surgical History  Procedure Date  . Hernia repair   . Thyroid surgery   . Parathyroid surgery   . Kidney surgery     [phone]

## 2012-09-23 NOTE — Progress Notes (Signed)
Patient ID: Connor Armstrong, male   DOB: June 16, 1951, 62 y.o.   MRN: [id number]  [facility] NEUROLOG[facility] Odessie Polzin A. Gerilyn Pilgrim, MD     [email or web address]          Connor Armstrong is an 62 y.o. male.   Assessment/Plan:  The patient is quite encephalopathic. This probably is accommodation of medication effect and increased PCO2. We will hold the Neurontin and clonazepam.  Cramp fasciculation syndrome. He is on Flexeril for this and also gabapentin. Again, the gabapentin has been held.  Myoclonus likely due to toxic metabolic processes. The clonazepam was restarted for this along with elevated dose of Neurontin has been held.  It is unclear if the dose adjustments in additional clonazepam have been helpful as patient is quite drowsy this even. The nurses report that he has had increased drowsiness and some hallucination although they have been able to awake the patient. Today for me is actually quite stuporous.   HEENT: Unremarkable.  ABDOMEN: soft  EXTREMITIES: No edema   SKIN: Normal by inspection.    MENTAL STATUS: The patient is sleeping. I was unable to awaken the patient with a painful stimuli.   CRANIAL NERVES: Pupils are equal, round and reactive to light and accomodation; extra ocular movements are full, there is no significant nystagmus; upper and lower facial muscles are normal in strength and symmetric, there is no flattening of the nasolabial folds.  MOTOR: The patient is moving both sides well.  COORDINATION: He is noted to have periodic myoclonic jerks. He is less tremulous today.  REFLEXES: Deep tendon reflexes are symmetrical and normal.  SENSATION: No response to deep pain bilaterally.     Objective: Vital signs in last 24 hours: Temp:  [97.9 F (36.6 C)-98.1 F (36.7 C)] 98.1 F (36.7 C) (01/14 1345) Resp:  [16-28] 23  (01/14 1900) BP: (97-158)/(52-100) 104/72 mmHg (01/14 1900) SpO2:  [9 %-99 %] 96 % (01/14 1938) FiO2 (%):  [30 %] 30 % (01/14  0200) Weight:  [80.7 kg (177 lb 14.6 oz)] 80.7 kg (177 lb 14.6 oz) (01/14 0500)  Intake/Output from previous day: 01/13 0701 - 01/14 0700 In: 4320 [P.O.:1320; I.V.:3000] Out: 3100 [Urine:3100] Intake/Output this shift:   Nutritional status: General   Lab Results: Results for orders placed during the hospital encounter of 09/18/12 (from the past 48 hour(s))  CK     Status: Normal   Collection Time   09/22/12  7:17 PM      Component Value Range Comment   Total CK 111  7 - 232 U/L   SEDIMENTATION RATE     Status: Normal   Collection Time   09/23/12  4:56 AM      Component Value Range Comment   Sed Rate 4  0 - 16 mm/hr   LACTATE DEHYDROGENASE     Status: Normal   Collection Time   09/23/12  4:56 AM      Component Value Range Comment   LDH 143  94 - 250 U/L     Lipid Panel No results found for this basename: CHOL,TRIG,HDL,CHOLHDL,VLDL,LDLCALC in the last 72 hours  Studies/Results: No results found.  Medications:  Scheduled Meds:   . albuterol  2.5 mg Nebulization Q4H  . aspirin  81 mg Oral Daily  . clonazePAM  0.5 mg Oral QHS  . cyclobenzaprine  10 mg Oral QID  . docusate sodium  100 mg Oral BID  . enoxaparin (LOVENOX) injection  40 mg Subcutaneous Q24H  . feeding supplement  1 Container Oral TID BM  . finasteride  5 mg Oral Daily  . gabapentin  600 mg Oral TID  . guaiFENesin  600 mg Oral BID  . hydrochlorothiazide  12.5 mg Oral Daily  . ipratropium  0.5 mg Nebulization Q4H  . levofloxacin  750 mg Oral Daily  . lisinopril  10 mg Oral Daily  . methylPREDNISolone (SOLU-MEDROL) injection  40 mg Intravenous Q12H  . Milnacipran  50 mg Oral BID  . mometasone-formoterol  2 puff Inhalation BID  . polyethylene glycol  17 g Oral Daily  . roflumilast  500 mcg Oral Daily   Continuous Infusions:   . sodium chloride 125 mL/hr at 09/23/12 1945  . albuterol 10 mg/hr (09/18/12 1049)   PRN Meds:.acetaminophen, acetaminophen, albuterol, alum & mag hydroxide-simeth, bisacodyl,  ondansetron (ZOFRAN) IV, ondansetron, sodium chloride, sodium phosphate     LOS: 5 days   Sia Gabrielsen A. Gerilyn Pilgrim, M.D.  Diplomate, Biomedical engineer of Psychiatry and Neurology ( Neurology).

## 2012-09-23 NOTE — Progress Notes (Signed)
Dr. Gerilyn Pilgrim rounded on [name] and discontinued his gabapentin and klonopin at this time. Pt was lethargic but saturations were upper 80's and 90's.

## 2012-09-23 NOTE — Progress Notes (Signed)
Subjective: And he is still wearing BiPAP. He uses it several hours every night. He says he's done better with his muscle spasm after starting Klonopin by Dr. Gerilyn Pilgrim. He is on a higher dose of gabapentin. He is coughing up more sputum  Objective: Vital signs in last 24 hours: Temp:  [97.8 F (36.6 C)-98.8 F (37.1 C)] 97.9 F (36.6 C) (01/14 0400) Pulse Rate:  [102-113] 102  (01/13 1000) Resp:  [16-26] 21  (01/14 0700) BP: (95-137)/(52-79) 130/75 mmHg (01/14 0700) SpO2:  [9 %-99 %] 9 % (01/14 0708) FiO2 (%):  [30 %] 30 % (01/14 0200) Weight:  [80.7 kg (177 lb 14.6 oz)] 80.7 kg (177 lb 14.6 oz) (01/14 0500) Weight change: 3.3 kg (7 lb 4.4 oz) Last BM Date: 09/22/12  Intake/Output from previous day: 01/13 0701 - 01/14 0700 In: 4195 [P.O.:1320; I.V.:2875] Out: 3100 [Urine:3100]  PHYSICAL EXAM General appearance: alert, cooperative and mild distress Resp: rhonchi bilaterally Cardio: regular rate and rhythm, S1, S2 normal, no murmur, click, rub or gallop GI: soft, non-tender; bowel sounds normal; no masses,  no organomegaly Extremities: extremities normal, atraumatic, no cyanosis or edema  Lab Results:    Basic Metabolic Panel: No results found for this basename: NA:2,K:2,CL:2,CO2:2,GLUCOSE:2,BUN:2,CREATININE:2,CALCIUM:2,MG:2,PHOS:2 in the last 72 hours Liver Function Tests: No results found for this basename: AST:2,ALT:2,ALKPHOS:2,BILITOT:2,PROT:2,ALBUMIN:2 in the last 72 hours No results found for this basename: LIPASE:2,AMYLASE:2 in the last 72 hours No results found for this basename: AMMONIA:2 in the last 72 hours CBC: No results found for this basename: WBC:2,NEUTROABS:2,HGB:2,HCT:2,MCV:2,PLT:2 in the last 72 hours Cardiac Enzymes:  Basename 09/22/12 1917  CKTOTAL 111  CKMB --  CKMBINDEX --  TROPONINI --   BNP: No results found for this basename: PROBNP:3 in the last 72 hours D-Dimer: No results found for this basename: DDIMER:2 in the last 72 hours CBG: No  results found for this basename: GLUCAP:6 in the last 72 hours Hemoglobin A1C: No results found for this basename: HGBA1C in the last 72 hours Fasting Lipid Panel: No results found for this basename: CHOL,HDL,LDLCALC,TRIG,CHOLHDL,LDLDIRECT in the last 72 hours Thyroid Function Tests: No results found for this basename: TSH,T4TOTAL,FREET4,T3FREE,THYROIDAB in the last 72 hours Anemia Panel: No results found for this basename: VITAMINB12,FOLATE,FERRITIN,TIBC,IRON,RETICCTPCT in the last 72 hours Coagulation: No results found for this basename: LABPROT:2,INR:2 in the last 72 hours Urine Drug Screen: Drugs of Abuse  No results found for this basename: labopia, cocainscrnur, labbenz, amphetmu, thcu, labbarb    Alcohol Level: No results found for this basename: ETH:2 in the last 72 hours Urinalysis: No results found for this basename: COLORURINE:2,APPERANCEUR:2,LABSPEC:2,PHURINE:2,GLUCOSEU:2,HGBUR:2,BILIRUBINUR:2,KETONESUR:2,PROTEINUR:2,UROBILINOGEN:2,NITRITE:2,LEUKOCYTESUR:2 in the last 72 hours Misc. Labs:  ABGS No results found for this basename: PHART,PCO2,PO2ART,TCO2,HCO3 in the last 72 hours CULTURES Recent Results (from the past 240 hour(s))  MRSA PCR SCREENING     Status: Normal   Collection Time   09/18/12  2:55 PM      Component Value Range Status Comment   MRSA by PCR NEGATIVE  NEGATIVE Final    Studies/Results: No results found.  Medications:  Prior to Admission:  Prescriptions prior to admission  Medication Sig Dispense Refill  . acetaminophen (TYLENOL) 500 MG tablet Take 1,000 mg by mouth every 6 (six) hours as needed. For pain       . albuterol (PROVENTIL HFA;VENTOLIN HFA) 108 (90 BASE) MCG/ACT inhaler Inhale 2 puffs into the lungs every 6 (six) hours as needed. For shortness of breath       . albuterol (PROVENTIL) (2.5 MG/3ML) 0.083% nebulizer  solution Take 3 mLs (2.5 mg total) by nebulization every 6 (six) hours as needed for wheezing.  75 mL  12  . aspirin 81 MG  chewable tablet Chew 81 mg by mouth daily.      . cyclobenzaprine (FLEXERIL) 10 MG tablet Take 10 mg by mouth 2 (two) times daily.        Marland Kitchen dextromethorphan-guaiFENesin (MUCINEX DM) 30-600 MG per 12 hr tablet Take 1 tablet by mouth every 12 (twelve) hours as needed. For congestion      . finasteride (PROSCAR) 5 MG tablet Take 5 mg by mouth daily.      . Fluticasone-Salmeterol (ADVAIR) 250-50 MCG/DOSE AEPB Inhale 1 puff into the lungs every 12 (twelve) hours.        . gabapentin (NEURONTIN) 300 MG capsule Take 300 mg by mouth at bedtime.      Marland Kitchen HYDROcodone-acetaminophen (NORCO) 5-325 MG per tablet Take 1 tablet by mouth every 6 (six) hours as needed. For shingles pain      . lisinopril-hydrochlorothiazide (PRINZIDE,ZESTORETIC) 20-12.5 MG per tablet Take 0.5 tablets by mouth daily.        . Milnacipran (SAVELLA) 50 MG TABS Take 50 mg by mouth 2 (two) times daily.        . roflumilast (DALIRESP) 500 MCG TABS tablet Take 1 tablet (500 mcg total) by mouth daily.  30 tablet  12  . tadalafil (CIALIS) 5 MG tablet Take 5 mg by mouth daily.       Marland Kitchen tiotropium (SPIRIVA) 18 MCG inhalation capsule Place 18 mcg into inhaler and inhale daily.         Scheduled:   . albuterol  2.5 mg Nebulization Q4H  . aspirin  81 mg Oral Daily  . clonazePAM  0.5 mg Oral QHS  . cyclobenzaprine  10 mg Oral QID  . docusate sodium  100 mg Oral BID  . enoxaparin (LOVENOX) injection  40 mg Subcutaneous Q24H  . feeding supplement  1 Container Oral TID BM  . finasteride  5 mg Oral Daily  . gabapentin  600 mg Oral TID  . guaiFENesin  600 mg Oral BID  . hydrochlorothiazide  12.5 mg Oral Daily  . ipratropium  0.5 mg Nebulization Q4H  . levofloxacin  750 mg Oral Daily  . lisinopril  10 mg Oral Daily  . methylPREDNISolone (SOLU-MEDROL) injection  40 mg Intravenous Q12H  . Milnacipran  50 mg Oral BID  . mometasone-formoterol  2 puff Inhalation BID  . polyethylene glycol  17 g Oral Daily  . roflumilast  500 mcg Oral Daily    Continuous:   . sodium chloride 125 mL/hr at 09/23/12 0600  . albuterol 10 mg/hr (09/18/12 1049)   ZOX:[id number], acetaminophen, albuterol, alum & mag hydroxide-simeth, bisacodyl, ondansetron (ZOFRAN) IV, ondansetron, sodium chloride, sodium phosphate  Assesment: He has acute on chronic respiratory failure. He has some abnormal movement disorder. He has severe fibromyalgia and severe COPD. He continues to use BiPAP at night. He is doing somewhat better with his movement disorder. Principal Problem:  *Acute-on-chronic respiratory failure Active Problems:  Acute exacerbation of chronic obstructive pulmonary disease (COPD)  Hypertension  Fibromyalgia  Myoclonus    Plan: Continue current treatments the help from Dr. Gerilyn Pilgrim is appreciated. Continue physical therapy    LOS: 5 days   Sendy Pluta L 09/23/2012, 7:57 AM

## 2012-09-24 ENCOUNTER — Inpatient Hospital Stay (HOSPITAL_COMMUNITY): Payer: Medicare Other

## 2012-09-24 ENCOUNTER — Encounter (HOSPITAL_COMMUNITY): Payer: Self-pay | Admitting: Anesthesiology

## 2012-09-24 ENCOUNTER — Inpatient Hospital Stay (HOSPITAL_COMMUNITY): Payer: Medicare Other | Admitting: Anesthesiology

## 2012-09-24 LAB — BLOOD GAS, ARTERIAL
Acid-Base Excess: 7.7 mmol/L — ABNORMAL HIGH (ref 0.0–2.0)
Acid-Base Excess: 7.7 mmol/L — ABNORMAL HIGH (ref 0.0–2.0)
Bicarbonate: 37.3 mEq/L — ABNORMAL HIGH (ref 20.0–24.0)
Drawn by: 21694
O2 Content: 60 L/min
O2 Saturation: 99.1 %
PEEP: 5 cmH2O
Patient temperature: 37
RATE: 12 resp/min
TCO2: 36.3 mmol/L (ref 0–100)
pCO2 arterial: 128 mmHg (ref 35.0–45.0)
pH, Arterial: 7.092 — CL (ref 7.350–7.450)
pO2, Arterial: 130 mmHg — ABNORMAL HIGH (ref 80.0–100.0)
pO2, Arterial: 77.7 mmHg — ABNORMAL LOW (ref 80.0–100.0)

## 2012-09-24 LAB — GLUCOSE, CAPILLARY
Glucose-Capillary: 197 mg/dL — ABNORMAL HIGH (ref 70–99)
Glucose-Capillary: 124 mg/dL — ABNORMAL HIGH (ref 70–99)

## 2012-09-24 LAB — BASIC METABOLIC PANEL
BUN: 25 mg/dL — ABNORMAL HIGH (ref 6–23)
Creatinine, Ser: 1.18 mg/dL (ref 0.50–1.35)
GFR calc non Af Amer: 65 mL/min — ABNORMAL LOW (ref >90)
Glucose, Bld: 111 mg/dL — ABNORMAL HIGH (ref 70–99)
Potassium: 4.4 mEq/L (ref 3.5–5.1)

## 2012-09-24 LAB — CBC WITH DIFFERENTIAL/PLATELET
Basophils Relative: 0 % (ref 0–1)
Eosinophils Absolute: 0 10*3/uL (ref 0.0–0.7)
Hemoglobin: 12.7 g/dL — ABNORMAL LOW (ref 13.0–17.0)
MCH: 30 pg (ref 26.0–34.0)
MCHC: 31.4 g/dL (ref 30.0–36.0)
Monocytes Absolute: 1 10*3/uL (ref 0.1–1.0)
Monocytes Relative: 9 % (ref 3–12)
Neutrophils Relative %: 87 % — ABNORMAL HIGH (ref 43–77)

## 2012-09-24 MED ORDER — PROPOFOL 10 MG/ML IV EMUL
INTRAVENOUS | Status: AC
  Filled 2012-09-24: qty 100

## 2012-09-24 MED ORDER — FENTANYL CITRATE 0.05 MG/ML IJ SOLN
50.0000 ug | INTRAMUSCULAR | Status: DC | PRN
  Administered 2012-09-24 – 2012-09-26 (×4): 100 ug via INTRAVENOUS
  Filled 2012-09-24 (×6): qty 2

## 2012-09-24 MED ORDER — VANCOMYCIN HCL IN DEXTROSE 1-5 GM/200ML-% IV SOLN
1000.0000 mg | Freq: Two times a day (BID) | INTRAVENOUS | Status: DC
  Administered 2012-09-24 – 2012-09-26 (×5): 1000 mg via INTRAVENOUS
  Filled 2012-09-24 (×11): qty 200

## 2012-09-24 MED ORDER — SODIUM CHLORIDE 0.9 % IV SOLN
25.0000 ug/h | INTRAVENOUS | Status: DC
  Administered 2012-09-24: 50 ug/h via INTRAVENOUS
  Filled 2012-09-24: qty 50

## 2012-09-24 MED ORDER — METHYLPREDNISOLONE SODIUM SUCC 125 MG IJ SOLR
125.0000 mg | Freq: Four times a day (QID) | INTRAMUSCULAR | Status: DC
  Administered 2012-09-24 – 2012-09-28 (×15): 125 mg via INTRAVENOUS
  Filled 2012-09-24 (×15): qty 2

## 2012-09-24 MED ORDER — ETOMIDATE 2 MG/ML IV SOLN
INTRAVENOUS | Status: AC
  Administered 2012-09-24: 10 mg
  Filled 2012-09-24: qty 20

## 2012-09-24 MED ORDER — SUCCINYLCHOLINE CHLORIDE 20 MG/ML IJ SOLN
INTRAMUSCULAR | Status: AC
  Administered 2012-09-24: 100 mg
  Filled 2012-09-24: qty 1

## 2012-09-24 MED ORDER — FENTANYL CITRATE 0.05 MG/ML IJ SOLN
INTRAMUSCULAR | Status: AC
  Filled 2012-09-24: qty 50

## 2012-09-24 MED ORDER — LEVOFLOXACIN IN D5W 750 MG/150ML IV SOLN
750.0000 mg | INTRAVENOUS | Status: DC
  Administered 2012-09-24 – 2012-09-28 (×5): 750 mg via INTRAVENOUS
  Filled 2012-09-24 (×6): qty 150

## 2012-09-24 MED ORDER — LIDOCAINE HCL (CARDIAC) 20 MG/ML IV SOLN
INTRAVENOUS | Status: AC
  Filled 2012-09-24: qty 5

## 2012-09-24 MED ORDER — BIOTENE DRY MOUTH MT LIQD
15.0000 mL | Freq: Four times a day (QID) | OROMUCOSAL | Status: DC
  Administered 2012-09-24 – 2012-10-01 (×26): 15 mL via OROMUCOSAL

## 2012-09-24 MED ORDER — CHLORHEXIDINE GLUCONATE 0.12 % MT SOLN
15.0000 mL | Freq: Two times a day (BID) | OROMUCOSAL | Status: DC
  Administered 2012-09-24 – 2012-09-29 (×12): 15 mL via OROMUCOSAL
  Filled 2012-09-24 (×12): qty 15

## 2012-09-24 MED ORDER — ROCURONIUM BROMIDE 50 MG/5ML IV SOLN
INTRAVENOUS | Status: AC
  Filled 2012-09-24: qty 2

## 2012-09-24 MED ORDER — PIPERACILLIN-TAZOBACTAM 3.375 G IVPB
3.3750 g | Freq: Three times a day (TID) | INTRAVENOUS | Status: DC
  Administered 2012-09-24 – 2012-09-29 (×15): 3.375 g via INTRAVENOUS
  Filled 2012-09-24 (×16): qty 50

## 2012-09-24 MED ORDER — PROPOFOL 10 MG/ML IV EMUL
5.0000 ug/kg/min | INTRAVENOUS | Status: DC
  Administered 2012-09-24: 50 ug/kg/min via INTRAVENOUS
  Administered 2012-09-24: 20 ug/kg/min via INTRAVENOUS
  Administered 2012-09-24 (×2): 50 ug/kg/min via INTRAVENOUS
  Administered 2012-09-24: 20 ug/kg/min via INTRAVENOUS
  Administered 2012-09-25: 50 ug/kg/min via INTRAVENOUS
  Administered 2012-09-25: 25 ug/kg/min via INTRAVENOUS
  Filled 2012-09-24 (×7): qty 100

## 2012-09-24 MED ORDER — PANTOPRAZOLE SODIUM 40 MG IV SOLR
40.0000 mg | INTRAVENOUS | Status: DC
  Administered 2012-09-24 – 2012-09-28 (×5): 40 mg via INTRAVENOUS
  Filled 2012-09-24 (×5): qty 40

## 2012-09-24 NOTE — Progress Notes (Signed)
Patient ID: Connor Armstrong, male   DOB: Apr 09, 1951, 62 y.o.   MRN: [id number]  [facility] NEUROLOGY [place] A. Gerilyn Pilgrim, MD     [email or web address]          Connor Armstrong is an 62 y.o. male.   Assessment/Plan: The patient is quite encephalopathic. This probably is accommodation of medication effect and increased PCO2. We will hold the Neurontin and clonazepam. The patient has been intubated because of the elevated PCO2.  Cramp fasciculation syndrome. He is on Flexeril for this and also gabapentin. Again, the gabapentin has been held.  Myoclonus likely due to toxic metabolic processes. The clonazepam was restarted for this along with elevated dose of Neurontin has been held.   The patient has been intubated overnight because of elevated PCO2 over 100. He is also less responsive as indicated last night. Today he is moving both sides well and still continued to have myoclonic jerks from time to time.     Objective: Vital signs in last 24 hours: Temp:  [97.7 F (36.5 C)-99.9 F (37.7 C)] 99.9 F (37.7 C) (01/15 0800) Pulse Rate:  [85-111] 98  (01/15 0800) Resp:  [12-28] 12  (01/15 0800) BP: (93-158)/(50-98) 118/87 mmHg (01/15 0800) SpO2:  [90 %-99 %] 99 % (01/15 0735) Arterial Line BP: (102-147)/(72-94) 135/77 mmHg (01/15 0800) FiO2 (%):  [35 %-60 %] 40 % (01/15 0800)  Intake/Output from previous day: 01/14 0701 - 01/15 0700 In: 4670.4 [P.O.:1760; I.V.:2910.4] Out: 1751 [Urine:1750; Stool:1] Intake/Output this shift:   Nutritional status: General   Lab Results: Results for orders placed during the hospital encounter of 09/18/12 (from the past 48 hour(s))  CK     Status: Normal   Collection Time   09/22/12  7:17 PM      Component Value Range Comment   Total CK 111  7 - 232 U/L   SEDIMENTATION RATE     Status: Normal   Collection Time   09/23/12  4:56 AM      Component Value Range Comment   Sed Rate 4  0 - 16 mm/hr   LACTATE DEHYDROGENASE     Status: Normal   Collection Time   09/23/12  4:56 AM      Component Value Range Comment   LDH 143  94 - 250 U/L   BLOOD GAS, ARTERIAL     Status: Abnormal   Collection Time   09/24/12 12:55 AM      Component Value Range Comment   FIO2 40.00      Delivery systems BILEVEL POSITIVE AIRWAY PRESSURE      Inspiratory PAP 16      Expiratory PAP 8      pH, Arterial 7.092 (*) 7.350 - 7.450    pCO2 arterial 128.0 (*) 35.0 - 45.0 mmHg    pO2, Arterial 77.7 (*) 80.0 - 100.0 mmHg    Bicarbonate 37.3 (*) 20.0 - 24.0 mEq/L    TCO2 36.3  0 - 100 mmol/L    Acid-Base Excess 7.7 (*) 0.0 - 2.0 mmol/L    O2 Saturation 93.1      Collection site LEFT RADIAL      Drawn by [id number]      Sample type ARTERIAL      Allens test (pass/fail) PASS  PASS   BLOOD GAS, ARTERIAL     Status: Abnormal   Collection Time   09/24/12  4:12 AM      Component Value Range Comment   O2 Content 60.0  Delivery systems VENTILATOR      Mode PRESSURE REGULATED VOLUME CONTROL      VT 550      Rate 12      Peep/cpap 5.0      pH, Arterial 7.390  7.350 - 7.450    pCO2 arterial 55.3 (*) 35.0 - 45.0 mmHg    pO2, Arterial 130.0 (*) 80.0 - 100.0 mmHg    Bicarbonate 32.7 (*) 20.0 - 24.0 mEq/L    TCO2 29.4  0 - 100 mmol/L    Acid-Base Excess 7.7 (*) 0.0 - 2.0 mmol/L    O2 Saturation 99.1      Patient temperature 37.0      Collection site RIGHT RADIAL      Drawn by [id number]      Sample type ARTERIAL      Allens test (pass/fail) PASS  PASS   GLUCOSE, CAPILLARY     Status: Abnormal   Collection Time   09/24/12  5:02 AM      Component Value Range Comment   Glucose-Capillary 197 (*) 70 - 99 mg/dL    Comment 1 Documented in Chart      Comment 2 Notify RN     GLUCOSE, CAPILLARY     Status: Normal   Collection Time   09/24/12  7:32 AM      Component Value Range Comment   Glucose-Capillary 92  70 - 99 mg/dL   BASIC METABOLIC PANEL     Status: Abnormal   Collection Time   09/24/12  8:27 AM      Component Value Range Comment   Sodium 136  135 - 145  mEq/L    Potassium 4.4  3.5 - 5.1 mEq/L    Chloride 99  96 - 112 mEq/L    CO2 37 (*) 19 - 32 mEq/L    Glucose, Bld 111 (*) 70 - 99 mg/dL    BUN 25 (*) 6 - 23 mg/dL    Creatinine, Ser 1.61  0.50 - 1.35 mg/dL    Calcium 8.4  8.4 - 09.6 mg/dL    GFR calc non Af Amer 65 (*) >90 mL/min    GFR calc Af Amer 75 (*) >90 mL/min   CBC WITH DIFFERENTIAL     Status: Abnormal   Collection Time   09/24/12  8:27 AM      Component Value Range Comment   WBC 11.3 (*) 4.0 - 10.5 K/uL    RBC 4.24  4.22 - 5.81 MIL/uL    Hemoglobin 12.7 (*) 13.0 - 17.0 g/dL    HCT 04.5  40.9 - 81.1 %    MCV 95.5  78.0 - 100.0 fL    MCH 30.0  26.0 - 34.0 pg    MCHC 31.4  30.0 - 36.0 g/dL    RDW 91.4 (*) 78.2 - 15.5 %    Platelets 190  150 - 400 K/uL    Neutrophils Relative 87 (*) 43 - 77 %    Neutro Abs 9.8 (*) 1.7 - 7.7 K/uL    Lymphocytes Relative 4 (*) 12 - 46 %    Lymphs Abs 0.4 (*) 0.7 - 4.0 K/uL    Monocytes Relative 9  3 - 12 %    Monocytes Absolute 1.0  0.1 - 1.0 K/uL    Eosinophils Relative 0  0 - 5 %    Eosinophils Absolute 0.0  0.0 - 0.7 K/uL    Basophils Relative 0  0 - 1 %  Basophils Absolute 0.0  0.0 - 0.1 K/uL     Lipid Panel No results found for this basename: CHOL,TRIG,HDL,CHOLHDL,VLDL,LDLCALC in the last 72 hours  Studies/Results: Dg Chest Port 1 View  09/24/2012  *RADIOLOGY REPORT*  Clinical Data: Intubation.  Respiratory failure.  PORTABLE CHEST - 1 VIEW  Comparison: 09/18/2012  Findings: Interval placement of an endotracheal tube with tip about 6.1 cm above the carina.  An enteric tube was placed.  The tip is not visualized but is below the left hemidiaphragm consistent with location in the inner distal to the stomach.  There is interval development of infiltration or atelectasis in both lung bases, greater on the left.  Probable emphysematous changes in the upper lungs.  Scattered fibrosis.  Normal heart size and pulmonary vascularity.  No pneumothorax.  IMPRESSION: Appliances appear in  satisfactory location.  Infiltration or atelectasis in the lung bases is developing.   Original Report Authenticated By: Burman Nieves, M.D.    Dg Chest Port 1v Same Day  09/24/2012  *RADIOLOGY REPORT*  Clinical Data: Endotracheal tube was advanced.  PORTABLE CHEST - 1 VIEW SAME DAY  Comparison: 09/24/2012 at 0209 hours  Findings: Endotracheal tube is present with tip about 4.8 cm above the carina.  The enteric tube tip is in the left upper quadrant consistent with location in the upper stomach.  Shallow inspiration.  Normal heart size and pulmonary vascularity. Infiltration or atelectasis in the lung bases.  IMPRESSION: Endotracheal tube tip is about 4.8 cm above the carina.  Persistent infiltration or atelectasis in the lung bases.   Original Report Authenticated By: Burman Nieves, M.D.     Medications:  Scheduled Meds:   . albuterol  2.5 mg Nebulization Q4H  . antiseptic oral rinse  15 mL Mouth Rinse QID  . aspirin  81 mg Oral Daily  . chlorhexidine  15 mL Mouth Rinse BID  . cyclobenzaprine  10 mg Oral QID  . docusate sodium  100 mg Oral BID  . enoxaparin (LOVENOX) injection  40 mg Subcutaneous Q24H  . feeding supplement  1 Container Oral TID BM  . finasteride  5 mg Oral Daily  . guaiFENesin  600 mg Oral BID  . hydrochlorothiazide  12.5 mg Oral Daily  . ipratropium  0.5 mg Nebulization Q4H  . levofloxacin (LEVAQUIN) IV  750 mg Intravenous Q24H  . lidocaine (cardiac) 100 mg/54ml      . lisinopril  10 mg Oral Daily  . methylPREDNISolone (SOLU-MEDROL) injection  125 mg Intravenous Q6H  . Milnacipran  50 mg Oral BID  . mometasone-formoterol  2 puff Inhalation BID  . piperacillin-tazobactam (ZOSYN)  IV  3.375 g Intravenous Q8H  . polyethylene glycol  17 g Oral Daily  . propofol      . rocuronium      . roflumilast  500 mcg Oral Daily  . vancomycin  1,000 mg Intravenous Q12H   Continuous Infusions:   . sodium chloride 125 mL/hr at 09/24/12 0600  . albuterol 10 mg/hr (09/18/12  1049)  . propofol 45 mcg/kg/min (09/24/12 0823)   PRN Meds:.acetaminophen, acetaminophen, albuterol, alum & mag hydroxide-simeth, bisacodyl, fentaNYL, ondansetron (ZOFRAN) IV, ondansetron, sodium chloride, sodium phosphate     LOS: 6 days   Xiamara Hulet A. Gerilyn Pilgrim, M.D.  Diplomate, Biomedical engineer of Psychiatry and Neurology ( Neurology).

## 2012-09-24 NOTE — Progress Notes (Signed)
Subjective: The events of last night are noted. He is now intubated and on the ventilator. He has severe COPD with chronic elevation of PCO2. When I saw him last night he said he was feeling well and felt like his lungs were doing better but he was having episodes of low oxygen saturation. He apparently basically just became unresponsive was found to have worsened PCO2.  Objective: Vital signs in last 24 hours: Temp:  [97.7 F (36.5 C)-98.8 F (37.1 C)] 98.8 F (37.1 C) (01/15 0400) Pulse Rate:  [85-111] 98  (01/15 0500) Resp:  [12-28] 12  (01/15 0600) BP: (93-158)/(50-100) 98/69 mmHg (01/15 0600) SpO2:  [90 %-99 %] 99 % (01/15 0735) Arterial Line BP: (138-147)/(72-94) 138/72 mmHg (01/15 0500) FiO2 (%):  [35 %-60 %] 40 % (01/15 0735) Weight change:  Last BM Date: 09/23/12  Intake/Output from previous day: 01/14 0701 - 01/15 0700 In: 4670.4 [P.O.:1760; I.V.:2910.4] Out: 1751 [Urine:1750; Stool:1]  PHYSICAL EXAM General appearance: Intubated and sedated. He has brown material coming from his oral gastric tube. Resp: rhonchi bilaterally Cardio: regular rate and rhythm, S1, S2 normal, no murmur, click, rub or gallop GI: soft, non-tender; bowel sounds normal; no masses,  no organomegaly Extremities: extremities normal, atraumatic, no cyanosis or edema  Lab Results:    Basic Metabolic Panel: No results found for this basename: NA:2,K:2,CL:2,CO2:2,GLUCOSE:2,BUN:2,CREATININE:2,CALCIUM:2,MG:2,PHOS:2 in the last 72 hours Liver Function Tests: No results found for this basename: AST:2,ALT:2,ALKPHOS:2,BILITOT:2,PROT:2,ALBUMIN:2 in the last 72 hours No results found for this basename: LIPASE:2,AMYLASE:2 in the last 72 hours No results found for this basename: AMMONIA:2 in the last 72 hours CBC: No results found for this basename: WBC:2,NEUTROABS:2,HGB:2,HCT:2,MCV:2,PLT:2 in the last 72 hours Cardiac Enzymes:  Basename 09/22/12 1917  CKTOTAL 111  CKMB --  CKMBINDEX --  TROPONINI --     BNP: No results found for this basename: PROBNP:3 in the last 72 hours D-Dimer: No results found for this basename: DDIMER:2 in the last 72 hours CBG:  Basename 09/24/12 0502  GLUCAP 197*   Hemoglobin A1C: No results found for this basename: HGBA1C in the last 72 hours Fasting Lipid Panel: No results found for this basename: CHOL,HDL,LDLCALC,TRIG,CHOLHDL,LDLDIRECT in the last 72 hours Thyroid Function Tests: No results found for this basename: TSH,T4TOTAL,FREET4,T3FREE,THYROIDAB in the last 72 hours Anemia Panel: No results found for this basename: VITAMINB12,FOLATE,FERRITIN,TIBC,IRON,RETICCTPCT in the last 72 hours Coagulation: No results found for this basename: LABPROT:2,INR:2 in the last 72 hours Urine Drug Screen: Drugs of Abuse  No results found for this basename: labopia, cocainscrnur, labbenz, amphetmu, thcu, labbarb    Alcohol Level: No results found for this basename: ETH:2 in the last 72 hours Urinalysis: No results found for this basename: COLORURINE:2,APPERANCEUR:2,LABSPEC:2,PHURINE:2,GLUCOSEU:2,HGBUR:2,BILIRUBINUR:2,KETONESUR:2,PROTEINUR:2,UROBILINOGEN:2,NITRITE:2,LEUKOCYTESUR:2 in the last 72 hours Misc. Labs:  ABGS  Basename 09/24/12 0412  PHART 7.390  PO2ART 130.0*  TCO2 29.4  HCO3 32.7*   CULTURES Recent Results (from the past 240 hour(s))  MRSA PCR SCREENING     Status: Normal   Collection Time   09/18/12  2:55 PM      Component Value Range Status Comment   MRSA by PCR NEGATIVE  NEGATIVE Final    Studies/Results: Dg Chest Port 1 View  09/24/2012  *RADIOLOGY REPORT*  Clinical Data: Intubation.  Respiratory failure.  PORTABLE CHEST - 1 VIEW  Comparison: 09/18/2012  Findings: Interval placement of an endotracheal tube with tip about 6.1 cm above the carina.  An enteric tube was placed.  The tip is not visualized but is below the left hemidiaphragm  consistent with location in the inner distal to the stomach.  There is interval development of  infiltration or atelectasis in both lung bases, greater on the left.  Probable emphysematous changes in the upper lungs.  Scattered fibrosis.  Normal heart size and pulmonary vascularity.  No pneumothorax.  IMPRESSION: Appliances appear in satisfactory location.  Infiltration or atelectasis in the lung bases is developing.   Original Report Authenticated By: Burman Nieves, M.D.    Dg Chest Port 1v Same Day  09/24/2012  *RADIOLOGY REPORT*  Clinical Data: Endotracheal tube was advanced.  PORTABLE CHEST - 1 VIEW SAME DAY  Comparison: 09/24/2012 at 0209 hours  Findings: Endotracheal tube is present with tip about 4.8 cm above the carina.  The enteric tube tip is in the left upper quadrant consistent with location in the upper stomach.  Shallow inspiration.  Normal heart size and pulmonary vascularity. Infiltration or atelectasis in the lung bases.  IMPRESSION: Endotracheal tube tip is about 4.8 cm above the carina.  Persistent infiltration or atelectasis in the lung bases.   Original Report Authenticated By: Burman Nieves, M.D.     Medications:  Prior to Admission:  Prescriptions prior to admission  Medication Sig Dispense Refill  . acetaminophen (TYLENOL) 500 MG tablet Take 1,000 mg by mouth every 6 (six) hours as needed. For pain       . albuterol (PROVENTIL HFA;VENTOLIN HFA) 108 (90 BASE) MCG/ACT inhaler Inhale 2 puffs into the lungs every 6 (six) hours as needed. For shortness of breath       . albuterol (PROVENTIL) (2.5 MG/3ML) 0.083% nebulizer solution Take 3 mLs (2.5 mg total) by nebulization every 6 (six) hours as needed for wheezing.  75 mL  12  . aspirin 81 MG chewable tablet Chew 81 mg by mouth daily.      . cyclobenzaprine (FLEXERIL) 10 MG tablet Take 10 mg by mouth 2 (two) times daily.        Marland Kitchen dextromethorphan-guaiFENesin (MUCINEX DM) 30-600 MG per 12 hr tablet Take 1 tablet by mouth every 12 (twelve) hours as needed. For congestion      . finasteride (PROSCAR) 5 MG tablet Take 5 mg by  mouth daily.      . Fluticasone-Salmeterol (ADVAIR) 250-50 MCG/DOSE AEPB Inhale 1 puff into the lungs every 12 (twelve) hours.        . gabapentin (NEURONTIN) 300 MG capsule Take 300 mg by mouth at bedtime.      Marland Kitchen HYDROcodone-acetaminophen (NORCO) 5-325 MG per tablet Take 1 tablet by mouth every 6 (six) hours as needed. For shingles pain      . lisinopril-hydrochlorothiazide (PRINZIDE,ZESTORETIC) 20-12.5 MG per tablet Take 0.5 tablets by mouth daily.        . Milnacipran (SAVELLA) 50 MG TABS Take 50 mg by mouth 2 (two) times daily.        . roflumilast (DALIRESP) 500 MCG TABS tablet Take 1 tablet (500 mcg total) by mouth daily.  30 tablet  12  . tadalafil (CIALIS) 5 MG tablet Take 5 mg by mouth daily.       Marland Kitchen tiotropium (SPIRIVA) 18 MCG inhalation capsule Place 18 mcg into inhaler and inhale daily.         Scheduled:   . albuterol  2.5 mg Nebulization Q4H  . antiseptic oral rinse  15 mL Mouth Rinse QID  . aspirin  81 mg Oral Daily  . chlorhexidine  15 mL Mouth Rinse BID  . cyclobenzaprine  10 mg Oral QID  .  docusate sodium  100 mg Oral BID  . enoxaparin (LOVENOX) injection  40 mg Subcutaneous Q24H  . feeding supplement  1 Container Oral TID BM  . finasteride  5 mg Oral Daily  . guaiFENesin  600 mg Oral BID  . hydrochlorothiazide  12.5 mg Oral Daily  . ipratropium  0.5 mg Nebulization Q4H  . levofloxacin  750 mg Oral Daily  . lidocaine (cardiac) 100 mg/79ml      . lisinopril  10 mg Oral Daily  . methylPREDNISolone (SOLU-MEDROL) injection  40 mg Intravenous Q12H  . Milnacipran  50 mg Oral BID  . mometasone-formoterol  2 puff Inhalation BID  . polyethylene glycol  17 g Oral Daily  . propofol      . rocuronium      . roflumilast  500 mcg Oral Daily   Continuous:   . sodium chloride 125 mL/hr at 09/24/12 0600  . albuterol 10 mg/hr (09/18/12 1049)  . propofol 20 mcg/kg/min (09/24/12 1610)   RUE:[id number], acetaminophen, albuterol, alum & mag hydroxide-simeth, bisacodyl,  fentaNYL, ondansetron (ZOFRAN) IV, ondansetron, sodium chloride, sodium phosphate  Assesment: He now has ventilator dependent acute on chronic respiratory failure. He has severe COPD. He has some movement disorder. He has severe fibromyalgia Principal Problem:  *Acute-on-chronic respiratory failure Active Problems:  Acute exacerbation of chronic obstructive pulmonary disease (COPD)  Hypertension  Fibromyalgia  Myoclonus    Plan: Continue ventilator support. I'm going to hold on tube feedings until his gastric secretions are less    LOS: 6 days   Morse Brueggemann L 09/24/2012, 7:51 AM

## 2012-09-24 NOTE — Procedures (Signed)
Arterial Catheter Insertion Procedure Note Connor Armstrong [id number] 1951-07-23  Procedure: Insertion of Arterial Catheter  Indications: Blood pressure monitoring and Frequent blood sampling  Procedure Details Consent: Risks of procedure as well as the alternatives and risks of each were explained to the (patient/caregiver).  Consent for procedure obtained. Time Out: Verified patient identification, verified procedure, site/side was marked, verified correct patient position, special equipment/implants available, medications/allergies/relevent history reviewed, required imaging and test results available.  Performed  Maximum sterile technique was used including antiseptics, cap, gloves, gown, hand hygiene, mask and sheet. Skin prep: Chlorhexidine; local anesthetic administered 20 gauge catheter was inserted into right radial artery using the Seldinger technique.  Evaluation Blood flow good; BP tracing good. Complications: No apparent complications.   Olegario Shearer 09/24/2012

## 2012-09-24 NOTE — Progress Notes (Signed)
Nutrition Follow-up (Change in Status)  INTERVENTION:  If pt unable to successfully wean 24-48 hr and  is appropriate for EN recommend initiate continuous Osm. 1.5 @ 20 ml/hr via NG/OGT. Add ProStat 60 ml TID. Nutrition support will provide: 1320 kcal, 120 gr protein, 365 ml water which will meet 76% energy, and 100% estimated protein needs.  NUTRITION DIAGNOSIS: Inadequate oral intake related to inability to eat as evidenced by NPO status.  Goal: Pt to meet >/= 90% of their estimated nutrition needs; not met  Monitor:  Nutrition status and adequacy, labs and wt changes   62 y.o. male  Admitting Dx: Acute-on-chronic respiratory failure  ASSESSMENT: Patient is currently intubated on ventilator support. Possible colonic ileus per radiology report today. MV: 6.9 ml Temp:Temp (24hrs), Avg:98.7 F (37.1 C), Min:97.7 F (36.5 C), Max:99.9 F (37.7 C)  Propofol: 9.7 ml/hr: caloric load 256 kcal daily at current rate  IVF-NS @125  ml/hr  RADIOLOGY REPORT*   Clinical Data: Distended abdomen.   PORTABLE ABDOMEN - 1 VIEW  Comparison: CT abdomen pelvis contrast 08/28/2010  Findings: The imaged lung bases are clear. A nasogastric tube  terminates in the fundal region of the stomach. There are multiple  dilated loops of bowel, each of which appear to have haustra, and  favored to be colon. No definite dilated small bowel loops. The  lower pelvis is not occluded on this imaging. Therefore evaluation  of the distal sigmoid and rectum is not possible. There are  surgical clips projecting over the right abdomen. No gross  evidence of free intraperitoneal air on this portable supine view  of the abdomen.  IMPRESSION:  Multiple gas-filled dilated loops of bowel, favored to be colon.  The lower pelvis is not included on this image. The primary  considerations include distal colonic obstruction or colonic ileus.   Original Report Authenticated By: Britta Mccreedy, M.D.       Height: Ht  Readings from Last 1 Encounters:  09/18/12 5\' 9"  (1.753 m)    Weight: Wt Readings from Last 1 Encounters:  09/23/12 177 lb 14.6 oz (80.7 kg)    Ideal Body Weight: 160# (72.7 kg)  % Ideal Body Weight: 95%  Wt Readings from Last 10 Encounters:  09/23/12 177 lb 14.6 oz (80.7 kg)  12/13/11 190 lb 4.1 oz (86.3 kg)  09/26/11 162 lb 6.4 oz (73.664 kg)  09/21/11 174 lb 13.2 oz (79.3 kg)    Usual Body Weight: 174# (79kg)  % Usual Body Weight: 87%   BMI:  Body mass index is 26.27 kg/(m^2). Normal range  Estimated Nutritional Needs: Kcal: 1738 Protein: 112-129 gr Fluid: 1 ml/kcal  Skin: no issues noted  Diet Order: General   EDUCATION NEEDS: -No education needs identified at this time   Intake/Output Summary (Last 24 hours) at 09/24/12 1512 Last data filed at 09/24/12 1411  Gross per 24 hour  Intake 2830.41 ml  Output   2450 ml  Net 380.41 ml    Last BM: 09/22/12  Labs:   Lab 09/24/12 0827 09/19/12 0452 09/18/12 0823  NA 136 133* 138  K 4.4 4.1 4.9  CL 99 88* 91*  CO2 37* 40* >45*  BUN 25* 23 27*  CREATININE 1.18 1.10 1.28  CALCIUM 8.4 8.6 9.3  MG -- 2.2 --  PHOS -- -- --  GLUCOSE 111* 144* 137*    CBG (last 3)   Basename 09/24/12 1118 09/24/12 0732 09/24/12 0502  GLUCAP 133* 92 197*    Scheduled Meds:    .  albuterol  2.5 mg Nebulization Q4H  . antiseptic oral rinse  15 mL Mouth Rinse QID  . aspirin  81 mg Oral Daily  . chlorhexidine  15 mL Mouth Rinse BID  . cyclobenzaprine  10 mg Oral QID  . docusate sodium  100 mg Oral BID  . enoxaparin (LOVENOX) injection  40 mg Subcutaneous Q24H  . feeding supplement  1 Container Oral TID BM  . finasteride  5 mg Oral Daily  . guaiFENesin  600 mg Oral BID  . hydrochlorothiazide  12.5 mg Oral Daily  . ipratropium  0.5 mg Nebulization Q4H  . levofloxacin (LEVAQUIN) IV  750 mg Intravenous Q24H  . lisinopril  10 mg Oral Daily  . methylPREDNISolone (SOLU-MEDROL) injection  125 mg Intravenous Q6H  .  Milnacipran  50 mg Oral BID  . mometasone-formoterol  2 puff Inhalation BID  . piperacillin-tazobactam (ZOSYN)  IV  3.375 g Intravenous Q8H  . polyethylene glycol  17 g Oral Daily  . roflumilast  500 mcg Oral Daily  . vancomycin  1,000 mg Intravenous Q12H    Continuous Infusions:    . sodium chloride 125 mL/hr at 09/24/12 1402  . albuterol 10 mg/hr (09/18/12 1049)  . propofol 50 mcg/kg/min (09/24/12 1109)    Past Medical History  Diagnosis Date  . COPD (chronic obstructive pulmonary disease)   . Hypertension   . Fibromyalgia   . Shingles   . Cancer     Past Surgical History  Procedure Date  . Hernia repair   . Thyroid surgery   . Parathyroid surgery   . Kidney surgery     [phone]

## 2012-09-24 NOTE — Progress Notes (Signed)
Noted that patient abdomen distended and very hypo bowels sounds. Dr. Juanetta Gosling informed abd. Xray ordered.

## 2012-09-24 NOTE — Progress Notes (Signed)
CRNA, Amy Andraza intubated pt and prior to intubation 5cc of Amidate and 5cc of Anectine were administered. Pt tolerated procedure well. CRNA also inserted OG tube size 16. Verification of placement of OG tube was auscultated and verified by chest xray.

## 2012-09-24 NOTE — Addendum Note (Signed)
Addendum  created 09/24/12 0216 by Marolyn Hammock, CRNA   Modules edited:Charges VN

## 2012-09-24 NOTE — Progress Notes (Signed)
Aline zeroed, waveform good, and site is clean and dressed per protocol. Aline has been flushed

## 2012-09-24 NOTE — Progress Notes (Signed)
Pt's stomach is very hard and he has a lot of blood coming from his subglotic suction and a lot of tan from his OG.RT changed his ET holder.

## 2012-09-24 NOTE — Progress Notes (Addendum)
eLink Physician-Brief Progress Note Patient Name: BUBBA VANBENSCHOTEN DOB: [birth year] MRN: [id number]  Date of Service  09/24/2012   HPI/Events of Note  RN says earlier in shift alert, calm but mildly confused. 4h ago went on routine nocturnal bipap. Now unrespnsive per RN  On camera exam  HR 94, MAP 71, RR 14, not paradoxical But RASS -3/-4  eICU Interventions  Stat abg Intubation likely needed   Addendum 1:22 AM on 09/24/2012  Lab 09/24/12 0055 09/19/12 0508 09/18/12 1335 09/18/12 1005  PHART 7.092* 7.386 7.349* 7.290*  PCO2ART 128.0* 71.9* 84.0* 99.8*  PO2ART 77.7* 63.0* 66.6* 57.8*  HCO3 37.3* 42.2* 45.1* 46.5*  TCO2 36.3 38.4 41.0 42.5  O2SAT 93.1 92.2 93.5 89.4    Plan  - intubate (RN d/w wife about goal of care)  - post intubation cxr, abg vent orders sent   Intervention Category Major Interventions: Other:;Respiratory failure - evaluation and management  Lillar Bianca 09/24/2012, 12:54 AM

## 2012-09-24 NOTE — Progress Notes (Signed)
Rounding on pt after respiratory completed breathing treatment. Pt unresponsive to sternal rub and other painful stimuli. Notified ELINK MD. Stat ABG ordered. Upon results of ABG, ELINK MD made decision that pt needed to be intubated. Called wife and made her aware that pt needed to be intubated and she agreed that she did want him to intubated. Wife stated she wanted all measures to be taken to keep patient alive. She consented to 2 RN's (myself and Consuello Masse, RN) that respiratory could insert arterial line. Nursing supervisor was notified that patient was to be intubated.

## 2012-09-24 NOTE — Procedures (Signed)
Intubation Procedure Note Connor Armstrong [id number] [birth year]           Procedure: Intubation Indications: Respiratory insufficiency   Procedure Details Consent: Risks of procedure as well as the alternatives and risks of each were explained to the (patient/caregiver).  Consent for procedure obtained. Time Out: Verified patient identification, verified procedure, site/side was marked, verified correct patient position, special equipment/implants available, medications/allergies/relevent history reviewed, required imaging and test results available.  Performed  Maximum sterile technique was used including antiseptics, cap, gloves, gown, hand hygiene, mask and sheet.  Miller and 3    Evaluation Hemodynamic Status: BP stable throughout; O2 sats: stable throughout Patient's Current Condition: stable Complications: No apparent complications Patient did tolerate procedure well. Chest X-ray ordered to verify placement.  CXR: pending.   Olegario Shearer 09/24/2012

## 2012-09-24 NOTE — Progress Notes (Signed)
Dr. Juanetta Gosling informed of the results of the xray. Informed about OG tube output and changing PO meds.

## 2012-09-24 NOTE — Anesthesia Procedure Notes (Signed)
Procedure Name: Intubation Date/Time: 09/24/2012 1:56 AM Performed by: Carolyne Littles, AMY L Pre-anesthesia Checklist: Patient identified, Emergency Drugs available, Suction available, Patient being monitored and Timeout performed Patient Re-evaluated:Patient Re-evaluated prior to inductionOxygen Delivery Method: Ambu bag Preoxygenation: Pre-oxygenation with 100% oxygen Intubation Type: IV induction, Rapid sequence and Cricoid Pressure applied Laryngoscope Size: Miller and 3 Tube type: Subglottic suction tube Tube size: 7.5 mm Number of attempts: 1 Placement Confirmation: ETT inserted through vocal cords under direct vision,  CO2 detector and breath sounds checked- equal and bilateral (PCXR) Secured at: 22 cm Tube secured with: Tape Dental Injury: Teeth and Oropharynx as per pre-operative assessment

## 2012-09-24 NOTE — Progress Notes (Signed)
ANTIBIOTIC CONSULT NOTE - INITIAL  Pharmacy Consult for Vancomycin, Zosyn, Levaquin Indication: pneumonia  No Known Allergies  Patient Measurements: Height: 5\' 9"  (175.3 cm) Weight: 177 lb 14.6 oz (80.7 kg) IBW/kg (Calculated) : 70.7   Vital Signs: Temp: 99.9 F (37.7 C) (01/15 0800) Temp src: Oral (01/15 0800) BP: 118/87 mmHg (01/15 0800) Pulse Rate: 98  (01/15 0800) Intake/Output from previous day: 01/14 0701 - 01/15 0700 In: 4670.4 [P.O.:1760; I.V.:2910.4] Out: 1751 [Urine:1750; Stool:1] Intake/Output from this shift:    Labs:  [facility] 09/24/12 0827  WBC 11.3*  HGB 12.7*  PLT 190  LABCREA --  CREATININE --   Estimated Creatinine Clearance: 70.5 ml/min (by C-G formula based on Cr of 1.1). No results found for this basename: VANCOTROUGH:2,VANCOPEAK:2,VANCORANDOM:2,GENTTROUGH:2,GENTPEAK:2,GENTRANDOM:2,TOBRATROUGH:2,TOBRAPEAK:2,TOBRARND:2,AMIKACINPEAK:2,AMIKACINTROU:2,AMIKACIN:2, in the last 72 hours   Microbiology: Recent Results (from the past 720 hour(s))  MRSA PCR SCREENING     Status: Normal   Collection Time   09/18/12  2:55 PM      Component Value Range Status Comment   MRSA by PCR NEGATIVE  NEGATIVE Final     Medical History: Past Medical History  Diagnosis Date  . COPD (chronic obstructive pulmonary disease)   . Hypertension   . Fibromyalgia   . Shingles   . Cancer     Medications:  Scheduled:    . albuterol  2.5 mg Nebulization Q4H  . antiseptic oral rinse  15 mL Mouth Rinse QID  . aspirin  81 mg Oral Daily  . chlorhexidine  15 mL Mouth Rinse BID  . cyclobenzaprine  10 mg Oral QID  . docusate sodium  100 mg Oral BID  . enoxaparin (LOVENOX) injection  40 mg Subcutaneous Q24H  . [COMPLETED] etomidate      . feeding supplement  1 Container Oral TID BM  . finasteride  5 mg Oral Daily  . guaiFENesin  600 mg Oral BID  . hydrochlorothiazide  12.5 mg Oral Daily  . ipratropium  0.5 mg Nebulization Q4H  . levofloxacin (LEVAQUIN) IV  750 mg  Intravenous Q24H  . lidocaine (cardiac) 100 mg/60ml      . lisinopril  10 mg Oral Daily  . methylPREDNISolone (SOLU-MEDROL) injection  125 mg Intravenous Q6H  . Milnacipran  50 mg Oral BID  . mometasone-formoterol  2 puff Inhalation BID  . piperacillin-tazobactam (ZOSYN)  IV  3.375 g Intravenous Q8H  . polyethylene glycol  17 g Oral Daily  . propofol      . rocuronium      . roflumilast  500 mcg Oral Daily  . [COMPLETED] succinylcholine      . vancomycin  1,000 mg Intravenous Q12H  . [DISCONTINUED] clonazePAM  0.5 mg Oral QHS  . [DISCONTINUED] gabapentin  600 mg Oral TID  . [DISCONTINUED] levofloxacin  750 mg Oral Daily  . [DISCONTINUED] methylPREDNISolone (SOLU-MEDROL) injection  40 mg Intravenous Q12H   Assessment: 62yo male in ICU, intubated and on vent due to respiratory distress.  Pt has good renal fxn.  Estimated Creatinine Clearance: 70.5 ml/min (by C-G formula based on Cr of 1.1).  Goal of Therapy:  Vancomycin trough level 15-20 mcg/ml  Plan: Vancomycin 1gm IV q12hrs Check trough at steady state Zosyn 3.375gm IV q8hrs (4hr infusion) Levaquin 750mg  IV q24hrs Monitor labs, renal fxn, and cultures per protocol Duration of therapy per MD  Valrie Hart A 09/24/2012,9:00 AM

## 2012-09-24 NOTE — Addendum Note (Signed)
Addendum  created 09/24/12 0216 by Amy L Andraza, CRNA   Modules edited:Charges VN    

## 2012-09-24 NOTE — Progress Notes (Signed)
Checked patient for impaction, no stool noted in rectum.

## 2012-09-25 ENCOUNTER — Inpatient Hospital Stay (HOSPITAL_COMMUNITY): Payer: Medicare Other

## 2012-09-25 LAB — BLOOD GAS, ARTERIAL
Acid-Base Excess: 12.6 mmol/L — ABNORMAL HIGH (ref 0.0–2.0)
Bicarbonate: 37.3 mEq/L — ABNORMAL HIGH (ref 20.0–24.0)
Bicarbonate: 38.4 mEq/L — ABNORMAL HIGH (ref 20.0–24.0)
MECHVT: 500 mL
O2 Content: 40 L/min
Pressure support: 5 cmH2O
TCO2: 33.5 mmol/L (ref 0–100)
pCO2 arterial: 54.2 mmHg — ABNORMAL HIGH (ref 35.0–45.0)
pCO2 arterial: 55.7 mmHg — ABNORMAL HIGH (ref 35.0–45.0)
pH, Arterial: 7.453 — ABNORMAL HIGH (ref 7.350–7.450)
pO2, Arterial: 75.7 mmHg — ABNORMAL LOW (ref 80.0–100.0)
pO2, Arterial: 64.9 mmHg — ABNORMAL LOW (ref 80.0–100.0)

## 2012-09-25 LAB — GLUCOSE, CAPILLARY
Glucose-Capillary: 110 mg/dL — ABNORMAL HIGH (ref 70–99)
Glucose-Capillary: 137 mg/dL — ABNORMAL HIGH (ref 70–99)
Glucose-Capillary: 152 mg/dL — ABNORMAL HIGH (ref 70–99)
Glucose-Capillary: 153 mg/dL — ABNORMAL HIGH (ref 70–99)

## 2012-09-25 LAB — CBC WITH DIFFERENTIAL/PLATELET
Basophils Absolute: 0 10*3/uL (ref 0.0–0.1)
Eosinophils Relative: 0 % (ref 0–5)
Lymphocytes Relative: 3 % — ABNORMAL LOW (ref 12–46)
MCV: 91.3 fL (ref 78.0–100.0)
Platelets: 198 10*3/uL (ref 150–400)
RDW: 15.2 % (ref 11.5–15.5)
WBC: 10.9 10*3/uL — ABNORMAL HIGH (ref 4.0–10.5)

## 2012-09-25 LAB — COMPREHENSIVE METABOLIC PANEL
Alkaline Phosphatase: 44 U/L (ref 39–117)
BUN: 28 mg/dL — ABNORMAL HIGH (ref 6–23)
CO2: 37 mEq/L — ABNORMAL HIGH (ref 19–32)
Chloride: 97 mEq/L (ref 96–112)
GFR calc Af Amer: 80 mL/min — ABNORMAL LOW (ref >90)
Glucose, Bld: 144 mg/dL — ABNORMAL HIGH (ref 70–99)
Potassium: 4 mEq/L (ref 3.5–5.1)
Total Bilirubin: 0.3 mg/dL (ref 0.3–1.2)

## 2012-09-25 NOTE — ED Provider Notes (Signed)
Medical screening examination/treatment/procedure(s) were conducted as a shared visit with non-physician practitioner(s) and myself.  I personally evaluated the patient during the encounter.  Please see my previous note.   Laray Anger, DO 09/25/12 1717

## 2012-09-25 NOTE — Procedures (Signed)
Extubation Procedure Note  Patient Details:   Name: Connor Armstrong DOB: 1951/05/10 MRN: [id number]   Airway Documentation:  Airway (Active)     Airway 7.5 mm (Active)  Secured at (cm) 23 cm 09/25/2012  8:00 AM  Measured From Lips 09/25/2012  8:00 AM  Secured Location Right 09/25/2012  8:00 AM  Secured By [employer] 09/25/2012  8:00 AM  Cuff Pressure (cm H2O) 27 cm H2O 09/25/2012  7:39 AM    Evaluation  O2 sats: stable throughout Complications: No apparent complications Patient did tolerate procedure well. Bilateral Breath Sounds: Diminished Suctioning: Oral;Airway Yes  Katheren Shams 09/25/2012, 10:12 AM

## 2012-09-25 NOTE — Progress Notes (Signed)
Pt is now on ventilator.  We will d/c from PT at this time.  Please reconsult when medically appropriate.

## 2012-09-25 NOTE — Progress Notes (Signed)
Pt extubated to a 3L [place] HR 91, RR 18, BP 134/72. PT's NIF -30, FVC .9 L.. PT has a great cough and is able to follow direction and state what he wants

## 2012-09-25 NOTE — Progress Notes (Signed)
Subjective: He remains on the ventilator. He is weaning and attempting to communicate. He has no memory of the events that led to him being intubated  Objective: Vital signs in last 24 hours: Temp:  [97.9 F (36.6 C)-99.2 F (37.3 C)] 97.9 F (36.6 C) (01/16 0800) Pulse Rate:  [82-105] 92  (01/16 0800) Resp:  [12-17] 17  (01/16 0800) BP: (95-140)/(57-108) 129/84 mmHg (01/16 0800) SpO2:  [95 %-99 %] 97 % (01/15 1904) Arterial Line BP: (95-175)/(54-91) 95/56 mmHg (01/16 0500) FiO2 (%):  [40 %] 40 % (01/16 0739) Weight:  [83.3 kg (183 lb 10.3 oz)] 83.3 kg (183 lb 10.3 oz) (01/16 0500) Weight change:  Last BM Date: 09/23/12  Intake/Output from previous day: 01/15 0701 - 01/16 0700 In: 4473.9 [I.V.:3653.9; NG/GT:120; IV Piggyback:700] Out: 4900 [Urine:3200; Emesis/NG output:1400]  PHYSICAL EXAM General appearance: alert and moderate distress Resp: rhonchi bilaterally Cardio: regular rate and rhythm, S1, S2 normal, no murmur, click, rub or gallop GI: He is still somewhat distended with diminished bowel sounds Extremities: extremities normal, atraumatic, no cyanosis or edema  Lab Results:    Basic Metabolic Panel:  Basename 09/25/12 0435 09/24/12 0827  NA 138 136  K 4.0 4.4  CL 97 99  CO2 37* 37*  GLUCOSE 144* 111*  BUN 28* 25*  CREATININE 1.12 1.18  CALCIUM 7.9* 8.4  MG -- --  PHOS -- --   Liver Function Tests:  [facility] 09/25/12 0435  AST 14  ALT 14  ALKPHOS 44  BILITOT 0.3  PROT 4.6*  ALBUMIN 2.1*   No results found for this basename: LIPASE:2,AMYLASE:2 in the last 72 hours No results found for this basename: AMMONIA:2 in the last 72 hours CBC:  Basename 09/25/12 0435 09/24/12 0827  WBC 10.9* 11.3*  NEUTROABS 10.5* 9.8*  HGB 12.1* 12.7*  HCT 36.6* 40.5  MCV 91.3 95.5  PLT 198 190   Cardiac Enzymes:  Basename 09/22/12 1917  CKTOTAL 111  CKMB --  CKMBINDEX --  TROPONINI --   BNP: No results found for this basename: PROBNP:3 in the last 72  hours D-Dimer: No results found for this basename: DDIMER:2 in the last 72 hours CBG:  Basename 09/25/12 0753 09/25/12 0357 09/25/12 0010 09/24/12 1953 09/24/12 1608 09/24/12 1118  GLUCAP 128* 152* 153* 124* 117* 133*   Hemoglobin A1C: No results found for this basename: HGBA1C in the last 72 hours Fasting Lipid Panel: No results found for this basename: CHOL,HDL,LDLCALC,TRIG,CHOLHDL,LDLDIRECT in the last 72 hours Thyroid Function Tests: No results found for this basename: TSH,T4TOTAL,FREET4,T3FREE,THYROIDAB in the last 72 hours Anemia Panel: No results found for this basename: VITAMINB12,FOLATE,FERRITIN,TIBC,IRON,RETICCTPCT in the last 72 hours Coagulation: No results found for this basename: LABPROT:2,INR:2 in the last 72 hours Urine Drug Screen: Drugs of Abuse  No results found for this basename: labopia, cocainscrnur, labbenz, amphetmu, thcu, labbarb    Alcohol Level: No results found for this basename: ETH:2 in the last 72 hours Urinalysis: No results found for this basename: COLORURINE:2,APPERANCEUR:2,LABSPEC:2,PHURINE:2,GLUCOSEU:2,HGBUR:2,BILIRUBINUR:2,KETONESUR:2,PROTEINUR:2,UROBILINOGEN:2,NITRITE:2,LEUKOCYTESUR:2 in the last 72 hours Misc. Labs:  ABGS  Basename 09/25/12 0453  PHART 7.453*  PO2ART 75.7*  TCO2 33.5  HCO3 37.3*   CULTURES Recent Results (from the past 240 hour(s))  MRSA PCR SCREENING     Status: Normal   Collection Time   09/18/12  2:55 PM      Component Value Range Status Comment   MRSA by PCR NEGATIVE  NEGATIVE Final    Studies/Results: Dg Chest Port 1 View  09/25/2012  *RADIOLOGY REPORT*  Clinical Data: Respiratory failure  PORTABLE CHEST - 1 VIEW  Comparison: Portable chest x-ray of 09/24/2012  Findings: The lungs remain hyperaerated consistent with COPD. There is probable pulmonary vascular congestion present and there may be a small left effusion.  The heart is within upper limits of normal. Prominent hila again are noted which compared to  prior films appear to due to prominent pulmonary artery segments due to pulmonary arterial hypertension.  NG tube is noted into the fundus of the stomach.  The endotracheal tube remains in good position well above the carina.  IMPRESSION: COPD.  Question mild pulmonary vascular ingestion and possible small left effusion.   Original Report Authenticated By: Dwyane Dee, M.D.    Dg Chest Port 1 View  09/24/2012  *RADIOLOGY REPORT*  Clinical Data: Intubation.  Respiratory failure.  PORTABLE CHEST - 1 VIEW  Comparison: 09/18/2012  Findings: Interval placement of an endotracheal tube with tip about 6.1 cm above the carina.  An enteric tube was placed.  The tip is not visualized but is below the left hemidiaphragm consistent with location in the inner distal to the stomach.  There is interval development of infiltration or atelectasis in both lung bases, greater on the left.  Probable emphysematous changes in the upper lungs.  Scattered fibrosis.  Normal heart size and pulmonary vascularity.  No pneumothorax.  IMPRESSION: Appliances appear in satisfactory location.  Infiltration or atelectasis in the lung bases is developing.   Original Report Authenticated By: Burman Nieves, M.D.    Dg Chest Port 1v Same Day  09/24/2012  *RADIOLOGY REPORT*  Clinical Data: Endotracheal tube was advanced.  PORTABLE CHEST - 1 VIEW SAME DAY  Comparison: 09/24/2012 at 0209 hours  Findings: Endotracheal tube is present with tip about 4.8 cm above the carina.  The enteric tube tip is in the left upper quadrant consistent with location in the upper stomach.  Shallow inspiration.  Normal heart size and pulmonary vascularity. Infiltration or atelectasis in the lung bases.  IMPRESSION: Endotracheal tube tip is about 4.8 cm above the carina.  Persistent infiltration or atelectasis in the lung bases.   Original Report Authenticated By: Burman Nieves, M.D.    Dg Abd Portable 1v  09/25/2012  *RADIOLOGY REPORT*  Clinical Data: Ileus,  follow-up  PORTABLE ABDOMEN - 1 VIEW  Comparison: Abdomen films of 09/24/2012  Findings: There is persistent gaseous distention primarily of colon with air seen to the rectum.  This is most consistent with colonic ileus with no definite evidence of obstruction by plain film.  Only the lower abdomen and pelvis is visualized, and the NG tube position cannot be determined.  IMPRESSION: Little change in probable colonic ileus.   Original Report Authenticated By: Dwyane Dee, M.D.    Dg Abd Portable 1v  09/24/2012  **ADDENDUM** CREATED: 09/24/2012 14:29:48  The technologist performed a second AP view of the pelvis, as it was not included in on the first image of the abdomen.  This of the pelvis demonstrates gaseous distention of the cecum. Gas is seen in the nondistended sigmoid colon and rectum.  No dilated small bowel loops.  The bony pelvis is intact.  Impression:  Gas is seen throughout the colon to the level of the rectum.  Mild gaseous distention of the colon is not unexpected given colonoscopy performed today.  **END ADDENDUM** SIGNED BY: Britta Mccreedy, M.D.   09/24/2012  *RADIOLOGY REPORT*  Clinical Data: Distended abdomen.  PORTABLE ABDOMEN - 1 VIEW  Comparison: CT abdomen pelvis contrast  08/28/2010  Findings: The imaged lung bases are clear.  A nasogastric tube terminates in the fundal region of the stomach.  There are multiple dilated loops of bowel, each of which appear to have haustra, and favored to be colon. No definite dilated small bowel loops.  The lower pelvis is not occluded on this imaging.  Therefore evaluation of the distal sigmoid and rectum is not possible.  There are surgical clips projecting over the right abdomen.  No gross evidence of free intraperitoneal air on this portable supine view of the abdomen.  IMPRESSION: Multiple gas-filled dilated loops of bowel, favored to be colon. The lower pelvis is not included on this image.  The primary considerations include distal colonic obstruction  or colonic ileus.  Original Report Authenticated By: Britta Mccreedy, M.D.     Medications:  Prior to Admission:  Prescriptions prior to admission  Medication Sig Dispense Refill  . acetaminophen (TYLENOL) 500 MG tablet Take 1,000 mg by mouth every 6 (six) hours as needed. For pain       . albuterol (PROVENTIL HFA;VENTOLIN HFA) 108 (90 BASE) MCG/ACT inhaler Inhale 2 puffs into the lungs every 6 (six) hours as needed. For shortness of breath       . albuterol (PROVENTIL) (2.5 MG/3ML) 0.083% nebulizer solution Take 3 mLs (2.5 mg total) by nebulization every 6 (six) hours as needed for wheezing.  75 mL  12  . aspirin 81 MG chewable tablet Chew 81 mg by mouth daily.      . cyclobenzaprine (FLEXERIL) 10 MG tablet Take 10 mg by mouth 2 (two) times daily.        Marland Kitchen dextromethorphan-guaiFENesin (MUCINEX DM) 30-600 MG per 12 hr tablet Take 1 tablet by mouth every 12 (twelve) hours as needed. For congestion      . finasteride (PROSCAR) 5 MG tablet Take 5 mg by mouth daily.      . Fluticasone-Salmeterol (ADVAIR) 250-50 MCG/DOSE AEPB Inhale 1 puff into the lungs every 12 (twelve) hours.        . gabapentin (NEURONTIN) 300 MG capsule Take 300 mg by mouth at bedtime.      Marland Kitchen HYDROcodone-acetaminophen (NORCO) 5-325 MG per tablet Take 1 tablet by mouth every 6 (six) hours as needed. For shingles pain      . lisinopril-hydrochlorothiazide (PRINZIDE,ZESTORETIC) 20-12.5 MG per tablet Take 0.5 tablets by mouth daily.        . Milnacipran (SAVELLA) 50 MG TABS Take 50 mg by mouth 2 (two) times daily.        . roflumilast (DALIRESP) 500 MCG TABS tablet Take 1 tablet (500 mcg total) by mouth daily.  30 tablet  12  . tadalafil (CIALIS) 5 MG tablet Take 5 mg by mouth daily.       Marland Kitchen tiotropium (SPIRIVA) 18 MCG inhalation capsule Place 18 mcg into inhaler and inhale daily.         Scheduled:   . albuterol  2.5 mg Nebulization Q4H  . antiseptic oral rinse  15 mL Mouth Rinse QID  . aspirin  81 mg Oral Daily  .  chlorhexidine  15 mL Mouth Rinse BID  . enoxaparin (LOVENOX) injection  40 mg Subcutaneous Q24H  . ipratropium  0.5 mg Nebulization Q4H  . levofloxacin (LEVAQUIN) IV  750 mg Intravenous Q24H  . methylPREDNISolone (SOLU-MEDROL) injection  125 mg Intravenous Q6H  . mometasone-formoterol  2 puff Inhalation BID  . pantoprazole (PROTONIX) IV  40 mg Intravenous Q24H  . piperacillin-tazobactam (ZOSYN)  IV  3.375 g Intravenous Q8H  . vancomycin  1,000 mg Intravenous Q12H   Continuous:   . sodium chloride 125 mL/hr at 09/25/12 0628  . albuterol 10 mg/hr (09/18/12 1049)  . fentaNYL infusion INTRAVENOUS 25 mcg/hr (09/25/12 0622)  . propofol 25 mcg/kg/min (09/25/12 0622)   ZOX:[id number], acetaminophen, albuterol, alum & mag hydroxide-simeth, bisacodyl, fentaNYL, ondansetron (ZOFRAN) IV, ondansetron, sodium chloride, sodium phosphate  Assesment: He has acute on chronic respiratory failure. He remains intubated on the ventilator. He is doing pretty well with weaning at this point I don't know she's going to be extubated yet or not. He still has some distention of his colon but x-ray looks a little better to me. Official report is still pending Principal Problem:  *Acute-on-chronic respiratory failure Active Problems:  Acute exacerbation of chronic obstructive pulmonary disease (COPD)  Hypertension  Fibromyalgia  Myoclonus    Plan: Continue weaning and see how he does    LOS: 7 days   Lorik Guo L 09/25/2012, 8:38 AM

## 2012-09-26 ENCOUNTER — Inpatient Hospital Stay (HOSPITAL_COMMUNITY): Payer: Medicare Other

## 2012-09-26 ENCOUNTER — Encounter (HOSPITAL_COMMUNITY): Payer: Self-pay | Admitting: Urgent Care

## 2012-09-26 DIAGNOSIS — K560 Paralytic ileus: Secondary | ICD-10-CM

## 2012-09-26 LAB — BASIC METABOLIC PANEL
CO2: 35 mEq/L — ABNORMAL HIGH (ref 19–32)
Calcium: 8.3 mg/dL — ABNORMAL LOW (ref 8.4–10.5)
Chloride: 97 mEq/L (ref 96–112)
Creatinine, Ser: 1.09 mg/dL (ref 0.50–1.35)
Glucose, Bld: 118 mg/dL — ABNORMAL HIGH (ref 70–99)

## 2012-09-26 LAB — CBC WITH DIFFERENTIAL/PLATELET
Basophils Absolute: 0 10*3/uL (ref 0.0–0.1)
Eosinophils Absolute: 0 10*3/uL (ref 0.0–0.7)
Eosinophils Relative: 0 % (ref 0–5)
HCT: 38 % — ABNORMAL LOW (ref 39.0–52.0)
Lymphocytes Relative: 2 % — ABNORMAL LOW (ref 12–46)
Lymphs Abs: 0.1 10*3/uL — ABNORMAL LOW (ref 0.7–4.0)
MCH: 29.9 pg (ref 26.0–34.0)
MCV: 90 fL (ref 78.0–100.0)
Monocytes Absolute: 0.2 10*3/uL (ref 0.1–1.0)
Platelets: 211 10*3/uL (ref 150–400)
RDW: 15.3 % (ref 11.5–15.5)
WBC: 8.6 10*3/uL (ref 4.0–10.5)

## 2012-09-26 LAB — GLUCOSE, CAPILLARY: Glucose-Capillary: 116 mg/dL — ABNORMAL HIGH (ref 70–99)

## 2012-09-26 LAB — MAGNESIUM: Magnesium: 2.4 mg/dL (ref 1.5–2.5)

## 2012-09-26 LAB — VANCOMYCIN, TROUGH: Vancomycin Tr: 17.8 ug/mL (ref 10.0–20.0)

## 2012-09-26 MED ORDER — HYDROCODONE-ACETAMINOPHEN 5-325 MG PO TABS
1.0000 | ORAL_TABLET | Freq: Four times a day (QID) | ORAL | Status: DC | PRN
  Administered 2012-09-26 – 2012-09-27 (×2): 1 via ORAL
  Filled 2012-09-26 (×2): qty 1

## 2012-09-26 MED ORDER — FENTANYL CITRATE 0.05 MG/ML IJ SOLN: 25.0000 ug | INTRAMUSCULAR | Status: DC | PRN

## 2012-09-26 MED ORDER — IOHEXOL 300 MG/ML  SOLN
100.0000 mL | Freq: Once | INTRAMUSCULAR | Status: AC | PRN
  Administered 2012-09-26: 100 mL via INTRAVENOUS

## 2012-09-26 MED ORDER — IOHEXOL 300 MG/ML  SOLN
50.0000 mL | INTRAMUSCULAR | Status: AC
  Administered 2012-09-26 (×2): 50 mL via ORAL

## 2012-09-26 MED ORDER — CYCLOBENZAPRINE HCL 10 MG PO TABS
10.0000 mg | ORAL_TABLET | Freq: Two times a day (BID) | ORAL | Status: DC
  Filled 2012-09-26 (×2): qty 1

## 2012-09-26 MED ORDER — VANCOMYCIN HCL IN DEXTROSE 1-5 GM/200ML-% IV SOLN
1000.0000 mg | Freq: Two times a day (BID) | INTRAVENOUS | Status: DC
  Administered 2012-09-26 – 2012-09-28 (×6): 1000 mg via INTRAVENOUS
  Filled 2012-09-26 (×7): qty 200

## 2012-09-26 MED ORDER — GABAPENTIN 100 MG PO CAPS
100.0000 mg | ORAL_CAPSULE | Freq: Every day | ORAL | Status: DC
  Administered 2012-09-26 – 2012-09-27 (×2): 100 mg via ORAL
  Filled 2012-09-26 (×4): qty 1

## 2012-09-26 NOTE — Progress Notes (Signed)
ANTIBIOTIC CONSULT NOTE  Pharmacy Consult for Vancomycin, Zosyn, Levaquin Indication: pneumonia  No Known Allergies  Patient Measurements: Height: 5\' 9"  (175.3 cm) Weight: 192 lb 7.4 oz (87.3 kg) IBW/kg (Calculated) : 70.7   Vital Signs: Temp: 98.4 F (36.9 C) (01/17 0830) Temp src: Oral (01/17 0830) BP: 145/80 mmHg (01/17 1100) Intake/Output from previous day: 01/16 0701 - 01/17 0700 In: 3623.3 [I.V.:2921.3; IV Piggyback:702] Out: 1400 [Urine:1400] Intake/Output from this shift: Total I/O In: 970 [P.O.:420; I.V.:400; IV Piggyback:150] Out: -   Labs:  Basename 09/26/12 0900 09/25/12 0435 09/24/12 0827  WBC 8.6 10.9* 11.3*  HGB 12.6* 12.1* 12.7*  PLT 211 198 190  LABCREA -- -- --  CREATININE 1.09 1.12 1.18   Estimated Creatinine Clearance: 77.8 ml/min (by C-G formula based on Cr of 1.09).  Basename 09/26/12 0852  VANCOTROUGH 17.8  VANCOPEAK --  VANCORANDOM --  GENTTROUGH --  GENTPEAK --  GENTRANDOM --  TOBRATROUGH --  TOBRAPEAK --  TOBRARND --  AMIKACINPEAK --  AMIKACINTROU --  AMIKACIN --     Microbiology: Recent Results (from the past 720 hour(s))  MRSA PCR SCREENING     Status: Normal   Collection Time   09/18/12  2:55 PM      Component Value Range Status Comment   MRSA by PCR NEGATIVE  NEGATIVE Final     Medical History: Past Medical History  Diagnosis Date  . COPD (chronic obstructive pulmonary disease)   . Hypertension   . Fibromyalgia   . Shingles   . Cancer     kidney    Medications:  Scheduled:     . albuterol  2.5 mg Nebulization Q4H  . antiseptic oral rinse  15 mL Mouth Rinse QID  . aspirin  81 mg Oral Daily  . chlorhexidine  15 mL Mouth Rinse BID  . enoxaparin (LOVENOX) injection  40 mg Subcutaneous Q24H  . [COMPLETED] iohexol  50 mL Oral Q1 Hr x 2  . ipratropium  0.5 mg Nebulization Q4H  . levofloxacin (LEVAQUIN) IV  750 mg Intravenous Q24H  . methylPREDNISolone (SOLU-MEDROL) injection  125 mg Intravenous Q6H  .  mometasone-formoterol  2 puff Inhalation BID  . pantoprazole (PROTONIX) IV  40 mg Intravenous Q24H  . piperacillin-tazobactam (ZOSYN)  IV  3.375 g Intravenous Q8H  . vancomycin  1,000 mg Intravenous Q12H  . [DISCONTINUED] vancomycin  1,000 mg Intravenous Q12H   Assessment: 62yo male in ICU, extubated yesterday.  Estimated Creatinine Clearance: 77.8 ml/min (by C-G formula based on Cr of 1.09). Trough @ goal.  Goal of Therapy:  Vancomycin trough level 15-20 mcg/ml  Plan: Continue Vancomycin 1gm IV q12hrs Check trough weekly (or sooner if changes in renal function noted). Continue Zosyn  3.375gm IV q8hrs (4hr infusion) Levaquin 750mg  IV q24hrs Monitor labs, renal fxn, and cultures per protocol Duration of therapy per MD  Mady Gemma 09/26/2012,12:30 PM

## 2012-09-26 NOTE — Progress Notes (Signed)
FLEXISEAL INSERTED AS ORDERED. LOW INTERMITTINT SUCTION CONNECTED. PT TOLERATEDEDWELL.

## 2012-09-26 NOTE — Consult Note (Signed)
Referring Provider: Juanetta Gosling Primary Care Physician:  Fredirick Maudlin, MD Primary Gastroenterologist:  Dr. Jonette Eva  Reason for Consultation:  Ileus  HPI: Connor Armstrong is a 62 y.o. male admitted with respiratory therapy due to COPD exacerbation. He is found to have multiple dilated loops of bowel on plain films felt to be colonic distention/ileus.  He has been having abdominal pain which she rates 3/10 after coming off the vent. His pain is 7/10 at worse. His last bowel movement was last night & was a small amount of brown stool. He denies any rectal bleeding or melena. He does have nausea but denies any vomiting. He complains of abdominal distention. He denies any upper GI symptoms including dysphagia, odynophagia, anorexia, heartburn or indigestion. He reports a 47# pound weight loss in the last year that has been unintentional. He did start gabapentin and took one dose prior to his admission to the hospital. Denies family history of colorectal carcinoma.  He has not had a colonoscopy that he can remember.  Past Medical History  Diagnosis Date  . COPD (chronic obstructive pulmonary disease)   . Hypertension   . Fibromyalgia   . Shingles   . Cancer     Past Surgical History  Procedure Date  . Hernia repair   . Thyroid surgery   . Parathyroid surgery   . Kidney surgery     Prior to Admission medications   Medication Sig Start Date End Date Taking? Authorizing Provider  acetaminophen (TYLENOL) 500 MG tablet Take 1,000 mg by mouth every 6 (six) hours as needed. For pain    Yes Historical Provider, MD  albuterol (PROVENTIL HFA;VENTOLIN HFA) 108 (90 BASE) MCG/ACT inhaler Inhale 2 puffs into the lungs every 6 (six) hours as needed. For shortness of breath    Yes Historical Provider, MD  albuterol (PROVENTIL) (2.5 MG/3ML) 0.083% nebulizer solution Take 3 mLs (2.5 mg total) by nebulization every 6 (six) hours as needed for wheezing. 12/14/11 12/13/12 Yes Fredirick Maudlin, MD  aspirin 81  MG chewable tablet Chew 81 mg by mouth daily.   Yes Historical Provider, MD  cyclobenzaprine (FLEXERIL) 10 MG tablet Take 10 mg by mouth 2 (two) times daily.     Yes Historical Provider, MD  dextromethorphan-guaiFENesin (MUCINEX DM) 30-600 MG per 12 hr tablet Take 1 tablet by mouth every 12 (twelve) hours as needed. For congestion   Yes Historical Provider, MD  finasteride (PROSCAR) 5 MG tablet Take 5 mg by mouth daily.   Yes Historical Provider, MD  Fluticasone-Salmeterol (ADVAIR) 250-50 MCG/DOSE AEPB Inhale 1 puff into the lungs every 12 (twelve) hours.     Yes Historical Provider, MD  gabapentin (NEURONTIN) 300 MG capsule Take 300 mg by mouth at bedtime.   Yes Historical Provider, MD  HYDROcodone-acetaminophen (NORCO) 5-325 MG per tablet Take 1 tablet by mouth every 6 (six) hours as needed. For shingles pain   Yes Historical Provider, MD  lisinopril-hydrochlorothiazide (PRINZIDE,ZESTORETIC) 20-12.5 MG per tablet Take 0.5 tablets by mouth daily.     Yes Historical Provider, MD  Milnacipran (SAVELLA) 50 MG TABS Take 50 mg by mouth 2 (two) times daily.     Yes Historical Provider, MD  roflumilast (DALIRESP) 500 MCG TABS tablet Take 1 tablet (500 mcg total) by mouth daily. 12/14/11  Yes Fredirick Maudlin, MD  tadalafil (CIALIS) 5 MG tablet Take 5 mg by mouth daily.    Yes Historical Provider, MD  tiotropium (SPIRIVA) 18 MCG inhalation capsule Place 18 mcg into inhaler  and inhale daily.     Yes Historical Provider, MD    Current Facility-Administered Medications  Medication Dose Route Frequency Provider Last Rate Last Dose  . 0.9 %  sodium chloride infusion   Intravenous Continuous Fredirick Maudlin, MD 75 mL/hr at 09/26/12 1000    . acetaminophen (TYLENOL) tablet 650 mg  650 mg Oral Q6H PRN Fredirick Maudlin, MD   650 mg at 09/23/12 1329   Or  . acetaminophen (TYLENOL) suppository 650 mg  650 mg Rectal Q6H PRN Fredirick Maudlin, MD      . albuterol (PROVENTIL) (5 MG/ML) 0.5% nebulizer solution 2.5 mg   2.5 mg Nebulization Q4H Fredirick Maudlin, MD   2.5 mg at 09/26/12 0732  . albuterol (PROVENTIL) (5 MG/ML) 0.5% nebulizer solution 2.5 mg  2.5 mg Nebulization Q2H PRN Fredirick Maudlin, MD   2.5 mg at 09/22/12 1805  . albuterol (PROVENTIL,VENTOLIN) solution continuous neb  10 mg/hr Nebulization Continuous Kathie Dike, PA 2 mL/hr at 09/18/12 1049 10 mg/hr at 09/18/12 1049  . alum & mag hydroxide-simeth (MAALOX/MYLANTA) 200-200-20 MG/5ML suspension 30 mL  30 mL Oral Q6H PRN Fredirick Maudlin, MD   30 mL at 09/25/12 1053  . antiseptic oral rinse (BIOTENE) solution 15 mL  15 mL Mouth Rinse QID Fredirick Maudlin, MD   15 mL at 09/26/12 0400  . aspirin chewable tablet 81 mg  81 mg Oral Daily Fredirick Maudlin, MD   81 mg at 09/26/12 0936  . bisacodyl (DULCOLAX) suppository 10 mg  10 mg Rectal Daily PRN Fredirick Maudlin, MD   10 mg at 09/25/12 1547  . chlorhexidine (PERIDEX) 0.12 % solution 15 mL  15 mL Mouth Rinse BID Fredirick Maudlin, MD   15 mL at 09/26/12 0900  . enoxaparin (LOVENOX) injection 40 mg  40 mg Subcutaneous Q24H Fredirick Maudlin, MD   40 mg at 09/25/12 1427  . fentaNYL (SUBLIMAZE) 10 mcg/mL in sodium chloride 0.9 % 250 mL infusion  25-400 mcg/hr Intravenous Continuous Lonia Farber, MD   50 mcg/hr at 09/25/12 0900  . fentaNYL (SUBLIMAZE) injection 50-100 mcg  50-100 mcg Intravenous Q2H PRN Kalman Shan, MD   100 mcg at 09/26/12 0959  . ipratropium (ATROVENT) nebulizer solution 0.5 mg  0.5 mg Nebulization Q4H Fredirick Maudlin, MD   0.5 mg at 09/26/12 0732  . levofloxacin (LEVAQUIN) IVPB 750 mg  750 mg Intravenous Q24H Fredirick Maudlin, MD   750 mg at 09/25/12 1127  . methylPREDNISolone sodium succinate (SOLU-MEDROL) 125 mg/2 mL injection 125 mg  125 mg Intravenous Q6H Fredirick Maudlin, MD   125 mg at 09/26/12 0537  . mometasone-formoterol (DULERA) 100-5 MCG/ACT inhaler 2 puff  2 puff Inhalation BID Fredirick Maudlin, MD   2 puff at 09/26/12 0737  . ondansetron (ZOFRAN) tablet 4 mg   4 mg Oral Q6H PRN Fredirick Maudlin, MD       Or  . ondansetron [facility]) injection 4 mg  4 mg Intravenous Q6H PRN Fredirick Maudlin, MD   4 mg at 09/25/12 1127  . pantoprazole (PROTONIX) injection 40 mg  40 mg Intravenous Q24H Lonia Farber, MD   40 mg at 09/25/12 2152  . piperacillin-tazobactam (ZOSYN) IVPB 3.375 g  3.375 g Intravenous Q8H Fredirick Maudlin, MD   3.375 g at 09/26/12 0936  . propofol (DIPRIVAN) 10 mg/ml infusion  5-50 mcg/kg/min Intravenous Titrated Kalman Shan, MD   25 mcg/kg/min at 09/25/12 0900  .  sodium chloride 0.9 % injection 10 mL  10 mL Intravenous PRN Fredirick Maudlin, MD   10 mL at 09/25/12 2153  . sodium phosphate (FLEET) 7-19 GM/118ML enema 1 enema  1 enema Rectal Daily PRN Fredirick Maudlin, MD   1 enema at 09/26/12 0218  . vancomycin (VANCOCIN) IVPB 1000 mg/200 mL premix  1,000 mg Intravenous Q12H Fredirick Maudlin, MD   1,000 mg at 09/26/12 0936    Allergies as of 09/18/2012  . (No Known Allergies)    Family History  Problem Relation Age of Onset  . Hypertension Mother     History   Social History  . Marital Status: Married    Spouse Name: N/A    Number of Children: N/A  . Years of Education: N/A   Occupational History  . Not on file.   Social History Main Topics  . Smoking status: Current Every Day Smoker -- 1.0 packs/day  . Smokeless tobacco: Not on file  . Alcohol Use: No  . Drug Use: No  . Sexually Active: Not Currently   Other Topics Concern  . Not on file   Social History Narrative  . No narrative on file    Review of Systems: Gen: c/o fatigue & malaise CV: Denies chest pain, angina, palpitations, syncope, orthopnea, PND, peripheral edema, and claudication. Resp: chronic cough. GI: Denies vomiting blood, jaundice GU : Denies urinary burning, blood in urine, urinary frequency, urinary hesitancy, nocturnal urination, and urinary incontinence. MS: c/o low back pain  Derm: Denies rash, itching, dry skin, hives, moles,  warts, or unhealing ulcers.  Psych: Denies depression, anxiety, memory loss, suicidal ideation, hallucinations, paranoia, and confusion. Heme: +easy bruising Neuro:  Denies any headaches, dizziness, paresthesias. Endo:  Denies any problems with DM, thyroid, adrenal function.  Physical Exam: Vital signs in last 24 hours: Temp:  [98 F (36.7 C)-99 F (37.2 C)] 98.4 F (36.9 C) (01/17 0830) Pulse Rate:  [90] 90  (01/16 1100) Resp:  [14-21] 18  (01/17 0900) BP: (123-157)/(63-122) 154/87 mmHg (01/17 0900) SpO2:  [92 %-95 %] 93 % (01/17 0900) Arterial Line BP: (138)/(78) 138/78 mmHg (01/16 1100) Weight:  [192 lb 7.4 oz (87.3 kg)] 192 lb 7.4 oz (87.3 kg) (01/17 0500) Last BM Date: 09/23/12 No LMP for male patient. General:   Alert,  Well-developed, well-nourished, pleasant and cooperative in NAD Head:  Normocephalic and atraumatic. Eyes:  Sclera clear, no icterus.   Conjunctiva pink. Ears:  Normal auditory acuity. Nose:  No deformity, discharge, or lesions. Mouth:  oropharynx pink & moist. Neck:  Old scar.  Supple; no masses or thyromegaly. Lungs:  Inspiratory/Expiratory mod wheezes & crackles at bases bilat.  Decreased BS.  NAD. Heart:  Regular rate and rhythm Abdomen:  Normal bowel sounds.  No bruits.  + Distended, firm.  Mild RLQ tenderness to palpation.  Unable to palpate masses, hepatosplenomegaly or hernias noted.  No guarding or rebound tenderness.   Rectal:  No external or internal lesions visualized or palpated.  No dilation noted.  Mild prostatomegaly.  No distal obstruction.  Scant brown stool was Heme negative. Msk:  Symmetrical without gross deformities. Pulses:  Normal pulses noted. Extremities:  + clubbing.  No edema. Neurologic:  Alert and oriented x4;  grossly normal neurologically. Skin:  Intact without significant lesions or rashes. Lymph Nodes:  No significant cervical adenopathy. Psych:  Alert and cooperative. Normal mood and affect.  Intake/Output from previous  day: 01/16 0701 - 01/17 0700 In: 3623.3 [I.V.:2921.3; IV Piggyback:702]  Out: 1400 [Urine:1400] Intake/Output this shift: Total I/O In: 435 [P.O.:60; I.V.:325; IV Piggyback:50] Out: -   Lab Results:  Basename 09/26/12 0900 09/25/12 0435 09/24/12 0827  WBC 8.6 10.9* 11.3*  HGB 12.6* 12.1* 12.7*  HCT 38.0* 36.6* 40.5  PLT 211 198 190   BMET  Basename 09/26/12 0900 09/25/12 0435 09/24/12 0827  NA 139 138 136  K 4.1 4.0 4.4  CL 97 97 99  CO2 35* 37* 37*  GLUCOSE 118* 144* 111*  BUN 35* 28* 25*  CREATININE 1.09 1.12 1.18  CALCIUM 8.3* 7.9* 8.4   LFT  Basename 09/25/12 0435  PROT 4.6*  ALBUMIN 2.1*  AST 14  ALT 14  ALKPHOS 44  BILITOT 0.3  BILIDIR --  IBILI --  LIPASE --  AMYLASE --   Studies/Results: Dg Chest Port 1 View  09/25/2012  *RADIOLOGY REPORT*  Clinical Data: Respiratory failure  PORTABLE CHEST - 1 VIEW  Comparison: Portable chest x-ray of 09/24/2012  Findings: The lungs remain hyperaerated consistent with COPD. There is probable pulmonary vascular congestion present and there may be a small left effusion.  The heart is within upper limits of normal. Prominent hila again are noted which compared to prior films appear to due to prominent pulmonary artery segments due to pulmonary arterial hypertension.  NG tube is noted into the fundus of the stomach.  The endotracheal tube remains in good position well above the carina.  IMPRESSION: COPD.  Question mild pulmonary vascular ingestion and possible small left effusion.   Original Report Authenticated By: Dwyane Dee, M.D.    Dg Abd Portable 1v  09/25/2012  *RADIOLOGY REPORT*  Clinical Data: Ileus, follow-up  PORTABLE ABDOMEN - 1 VIEW  Comparison: Abdomen films of 09/24/2012  Findings: There is persistent gaseous distention primarily of colon with air seen to the rectum.  This is most consistent with colonic ileus with no definite evidence of obstruction by plain film.  Only the lower abdomen and pelvis is visualized, and  the NG tube position cannot be determined.  IMPRESSION: Little change in probable colonic ileus.   Original Report Authenticated By: Dwyane Dee, M.D.    Dg Abd Portable 1v  09/24/2012  **ADDENDUM** CREATED: 09/24/2012 14:29:48  The technologist performed a second AP view of the pelvis, as it was not included in on the first image of the abdomen.  This of the pelvis demonstrates gaseous distention of the cecum. Gas is seen in the nondistended sigmoid colon and rectum.  No dilated small bowel loops.  The bony pelvis is intact.  Impression:  Gas is seen throughout the colon to the level of the rectum.  Mild gaseous distention of the colon is not unexpected given colonoscopy performed today.  **END ADDENDUM** SIGNED BY: Britta Mccreedy, M.D.   09/24/2012  *RADIOLOGY REPORT*  Clinical Data: Distended abdomen.  PORTABLE ABDOMEN - 1 VIEW  Comparison: CT abdomen pelvis contrast 08/28/2010  Findings: The imaged lung bases are clear.  A nasogastric tube terminates in the fundal region of the stomach.  There are multiple dilated loops of bowel, each of which appear to have haustra, and favored to be colon. No definite dilated small bowel loops.  The lower pelvis is not occluded on this imaging.  Therefore evaluation of the distal sigmoid and rectum is not possible.  There are surgical clips projecting over the right abdomen.  No gross evidence of free intraperitoneal air on this portable supine view of the abdomen.  IMPRESSION: Multiple gas-filled dilated loops of  bowel, favored to be colon. The lower pelvis is not included on this image.  The primary considerations include distal colonic obstruction or colonic ileus.  Original Report Authenticated By: Britta Mccreedy, M.D.     Impression: Connor Armstrong is a pleasant 62 y.o. male admitted with COPD exacerbation & respiratory failure.  Abdominal distention & pain started after weaning off ventilator.  Plain films show colonic distention/ileus suggestive of Ogilvie's  syndrome.  No recent colonoscopy.  He does not appear to have toxic megacolon.    Plan: 1. Agree with PPI 2. CT Abd/pelvis w/ IV/oral contrast 3. Further recommendations pending CT 4. Continue clear liquids 5. Position changes q2hrs if able  6. Consider colonoscopy once acute respiratory issues resolved .   LOS: 8 days   Lorenza Burton  09/26/2012, 10:07 AM [facility]

## 2012-09-26 NOTE — Progress Notes (Signed)
Subjective: He says he had a difficult night mostly because of his abdomen. He was able to be extubated yesterday. His respiratory status is still tenuous considering his severe COPD. He has no other new complaints  Objective: Vital signs in last 24 hours: Temp:  [97.9 F (36.6 C)-99 F (37.2 C)] 98 F (36.7 C) (01/17 0400) Pulse Rate:  [87-92] 90  (01/16 1100) Resp:  [14-21] 16  (01/17 0500) BP: (123-157)/(63-122) 143/83 mmHg (01/17 0500) SpO2:  [92 %-98 %] 93 % (01/17 0732) Arterial Line BP: (134-148)/(71-79) 138/78 mmHg (01/16 1100) FiO2 (%):  [40 %] 40 % (01/16 0800) Weight:  [87.3 kg (192 lb 7.4 oz)] 87.3 kg (192 lb 7.4 oz) (01/17 0500) Weight change: 4 kg (8 lb 13.1 oz) Last BM Date: 09/23/12  Intake/Output from previous day: 01/16 0701 - 01/17 0700 In: 3498.3 [I.V.:2796.3; IV Piggyback:702] Out: 1400 [Urine:1400]  PHYSICAL EXAM General appearance: alert, cooperative and mild distress Resp: diminished breath sounds bilaterally Cardio: regular rate and rhythm, S1, S2 normal, no murmur, click, rub or gallop GI: Still markedly distended. Extremities: extremities normal, atraumatic, no cyanosis or edema  Lab Results:    Basic Metabolic Panel:  Basename 09/25/12 0435 09/24/12 0827  NA 138 136  K 4.0 4.4  CL 97 99  CO2 37* 37*  GLUCOSE 144* 111*  BUN 28* 25*  CREATININE 1.12 1.18  CALCIUM 7.9* 8.4  MG -- --  PHOS -- --   Liver Function Tests:  Basename 09/25/12 0435  AST 14  ALT 14  ALKPHOS 44  BILITOT 0.3  PROT 4.6*  ALBUMIN 2.1*   No results found for this basename: LIPASE:2,AMYLASE:2 in the last 72 hours No results found for this basename: AMMONIA:2 in the last 72 hours CBC:  Basename 09/25/12 0435 09/24/12 0827  WBC 10.9* 11.3*  NEUTROABS 10.5* 9.8*  HGB 12.1* 12.7*  HCT 36.6* 40.5  MCV 91.3 95.5  PLT 198 190   Cardiac Enzymes: No results found for this basename: CKTOTAL:3,CKMB:3,CKMBINDEX:3,TROPONINI:3 in the last 72 hours BNP: No results  found for this basename: PROBNP:3 in the last 72 hours D-Dimer: No results found for this basename: DDIMER:2 in the last 72 hours CBG:  Basename 09/26/12 0723 09/25/12 2140 09/25/12 1616 09/25/12 1145 09/25/12 0753 09/25/12 0357  GLUCAP 92 110* 101* 137* 128* 152*   Hemoglobin A1C: No results found for this basename: HGBA1C in the last 72 hours Fasting Lipid Panel: No results found for this basename: CHOL,HDL,LDLCALC,TRIG,CHOLHDL,LDLDIRECT in the last 72 hours Thyroid Function Tests: No results found for this basename: TSH,T4TOTAL,FREET4,T3FREE,THYROIDAB in the last 72 hours Anemia Panel: No results found for this basename: VITAMINB12,FOLATE,FERRITIN,TIBC,IRON,RETICCTPCT in the last 72 hours Coagulation: No results found for this basename: LABPROT:2,INR:2 in the last 72 hours Urine Drug Screen: Drugs of Abuse  No results found for this basename: labopia, cocainscrnur, labbenz, amphetmu, thcu, labbarb    Alcohol Level: No results found for this basename: ETH:2 in the last 72 hours Urinalysis: No results found for this basename: COLORURINE:2,APPERANCEUR:2,LABSPEC:2,PHURINE:2,GLUCOSEU:2,HGBUR:2,BILIRUBINUR:2,KETONESUR:2,PROTEINUR:2,UROBILINOGEN:2,NITRITE:2,LEUKOCYTESUR:2 in the last 72 hours Misc. Labs:  ABGS  Basename 09/25/12 0930  PHART 7.453*  PO2ART 64.9*  TCO2 34.2  HCO3 38.4*   CULTURES Recent Results (from the past 240 hour(s))  MRSA PCR SCREENING     Status: Normal   Collection Time   09/18/12  2:55 PM      Component Value Range Status Comment   MRSA by PCR NEGATIVE  NEGATIVE Final    Studies/Results: Dg Chest Port 1 View  09/25/2012  *  RADIOLOGY REPORT*  Clinical Data: Respiratory failure  PORTABLE CHEST - 1 VIEW  Comparison: Portable chest x-ray of 09/24/2012  Findings: The lungs remain hyperaerated consistent with COPD. There is probable pulmonary vascular congestion present and there may be a small left effusion.  The heart is within upper limits of normal.  Prominent hila again are noted which compared to prior films appear to due to prominent pulmonary artery segments due to pulmonary arterial hypertension.  NG tube is noted into the fundus of the stomach.  The endotracheal tube remains in good position well above the carina.  IMPRESSION: COPD.  Question mild pulmonary vascular ingestion and possible small left effusion.   Original Report Authenticated By: Dwyane Dee, M.D.    Dg Abd Portable 1v  09/25/2012  *RADIOLOGY REPORT*  Clinical Data: Ileus, follow-up  PORTABLE ABDOMEN - 1 VIEW  Comparison: Abdomen films of 09/24/2012  Findings: There is persistent gaseous distention primarily of colon with air seen to the rectum.  This is most consistent with colonic ileus with no definite evidence of obstruction by plain film.  Only the lower abdomen and pelvis is visualized, and the NG tube position cannot be determined.  IMPRESSION: Little change in probable colonic ileus.   Original Report Authenticated By: Dwyane Dee, M.D.    Dg Abd Portable 1v  09/24/2012  **ADDENDUM** CREATED: 09/24/2012 14:29:48  The technologist performed a second AP view of the pelvis, as it was not included in on the first image of the abdomen.  This of the pelvis demonstrates gaseous distention of the cecum. Gas is seen in the nondistended sigmoid colon and rectum.  No dilated small bowel loops.  The bony pelvis is intact.  Impression:  Gas is seen throughout the colon to the level of the rectum.  Mild gaseous distention of the colon is not unexpected given colonoscopy performed today.  **END ADDENDUM** SIGNED BY: Britta Mccreedy, M.D.   09/24/2012  *RADIOLOGY REPORT*  Clinical Data: Distended abdomen.  PORTABLE ABDOMEN - 1 VIEW  Comparison: CT abdomen pelvis contrast 08/28/2010  Findings: The imaged lung bases are clear.  A nasogastric tube terminates in the fundal region of the stomach.  There are multiple dilated loops of bowel, each of which appear to have haustra, and favored to be colon.  No definite dilated small bowel loops.  The lower pelvis is not occluded on this imaging.  Therefore evaluation of the distal sigmoid and rectum is not possible.  There are surgical clips projecting over the right abdomen.  No gross evidence of free intraperitoneal air on this portable supine view of the abdomen.  IMPRESSION: Multiple gas-filled dilated loops of bowel, favored to be colon. The lower pelvis is not included on this image.  The primary considerations include distal colonic obstruction or colonic ileus.  Original Report Authenticated By: Britta Mccreedy, M.D.     Medications:  Prior to Admission:  Prescriptions prior to admission  Medication Sig Dispense Refill  . acetaminophen (TYLENOL) 500 MG tablet Take 1,000 mg by mouth every 6 (six) hours as needed. For pain       . albuterol (PROVENTIL HFA;VENTOLIN HFA) 108 (90 BASE) MCG/ACT inhaler Inhale 2 puffs into the lungs every 6 (six) hours as needed. For shortness of breath       . albuterol (PROVENTIL) (2.5 MG/3ML) 0.083% nebulizer solution Take 3 mLs (2.5 mg total) by nebulization every 6 (six) hours as needed for wheezing.  75 mL  12  . aspirin 81 MG chewable tablet  Chew 81 mg by mouth daily.      . cyclobenzaprine (FLEXERIL) 10 MG tablet Take 10 mg by mouth 2 (two) times daily.        Marland Kitchen dextromethorphan-guaiFENesin (MUCINEX DM) 30-600 MG per 12 hr tablet Take 1 tablet by mouth every 12 (twelve) hours as needed. For congestion      . finasteride (PROSCAR) 5 MG tablet Take 5 mg by mouth daily.      . Fluticasone-Salmeterol (ADVAIR) 250-50 MCG/DOSE AEPB Inhale 1 puff into the lungs every 12 (twelve) hours.        . gabapentin (NEURONTIN) 300 MG capsule Take 300 mg by mouth at bedtime.      Marland Kitchen HYDROcodone-acetaminophen (NORCO) 5-325 MG per tablet Take 1 tablet by mouth every 6 (six) hours as needed. For shingles pain      . lisinopril-hydrochlorothiazide (PRINZIDE,ZESTORETIC) 20-12.5 MG per tablet Take 0.5 tablets by mouth daily.        .  Milnacipran (SAVELLA) 50 MG TABS Take 50 mg by mouth 2 (two) times daily.        . roflumilast (DALIRESP) 500 MCG TABS tablet Take 1 tablet (500 mcg total) by mouth daily.  30 tablet  12  . tadalafil (CIALIS) 5 MG tablet Take 5 mg by mouth daily.       Marland Kitchen tiotropium (SPIRIVA) 18 MCG inhalation capsule Place 18 mcg into inhaler and inhale daily.         Scheduled:   . albuterol  2.5 mg Nebulization Q4H  . antiseptic oral rinse  15 mL Mouth Rinse QID  . aspirin  81 mg Oral Daily  . chlorhexidine  15 mL Mouth Rinse BID  . enoxaparin (LOVENOX) injection  40 mg Subcutaneous Q24H  . ipratropium  0.5 mg Nebulization Q4H  . levofloxacin (LEVAQUIN) IV  750 mg Intravenous Q24H  . methylPREDNISolone (SOLU-MEDROL) injection  125 mg Intravenous Q6H  . mometasone-formoterol  2 puff Inhalation BID  . pantoprazole (PROTONIX) IV  40 mg Intravenous Q24H  . piperacillin-tazobactam (ZOSYN)  IV  3.375 g Intravenous Q8H  . vancomycin  1,000 mg Intravenous Q12H   Continuous:   . sodium chloride 125 mL/hr at 09/26/12 0500  . albuterol 10 mg/hr (09/18/12 1049)  . fentaNYL infusion INTRAVENOUS Stopped (09/25/12 1000)  . propofol Stopped (09/25/12 1000)   WNU:[id number], acetaminophen, albuterol, alum & mag hydroxide-simeth, bisacodyl, fentaNYL, ondansetron (ZOFRAN) IV, ondansetron, sodium chloride, sodium phosphate  Assesment: He had acute respiratory failure requiring ventilator support. He has severe COPD. It is not exactly clear what happened that required him to go on the ventilator but it may have been a combination of factors including his severe COPD the colonic ileus with inability to fully use his diaphragms and perhaps medications Principal Problem:  *Acute-on-chronic respiratory failure Active Problems:  Acute exacerbation of chronic obstructive pulmonary disease (COPD)  Hypertension  Fibromyalgia  Myoclonus    Plan: We are holding off on oral meds including medications for the episodes of  myoclonus. They seem better anyway. I'm going to ask for gastrointestinal consultation to see if there's anything else we can do about his abdominal situation    LOS: 8 days   Landry Kamath L 09/26/2012, 7:59 AM

## 2012-09-26 NOTE — Consult Note (Signed)
Abd: distended. Pt not ON FENTANYL DRIP BUT GETTING FENTANYL Q2-3H FOR FIBROMYALGIA PAIN. EXPLAINED TO PT IT IS CAUSING HIS BOWELS TO "SHUT DOWN." TAPER FENTANYL OFF. NORCO PRN. ADD BACK NEURONTIN. FLEX SIG TOMORROW AT 0900 IF PT'S COLON REMAINS AT 9-10 CM. RECTAL TUBE TO SUCTION. CHECK MAGNESIUM. DISCUSSED WITH DR. FAGAN,  REVIEWED.

## 2012-09-27 ENCOUNTER — Inpatient Hospital Stay (HOSPITAL_COMMUNITY): Payer: Medicare Other

## 2012-09-27 ENCOUNTER — Encounter (HOSPITAL_COMMUNITY)
Admission: EM | Disposition: A | Discharge status: Home Health | Payer: Self-pay | Source: Home / Self Care | Attending: Pulmonary Disease

## 2012-09-27 ENCOUNTER — Encounter (HOSPITAL_COMMUNITY): Payer: Self-pay | Admitting: *Deleted

## 2012-09-27 HISTORY — PX: FLEXIBLE SIGMOIDOSCOPY: SHX5431

## 2012-09-27 LAB — CBC WITH DIFFERENTIAL/PLATELET
Basophils Relative: 0 % (ref 0–1)
Eosinophils Absolute: 0 10*3/uL (ref 0.0–0.7)
Eosinophils Relative: 0 % (ref 0–5)
HCT: 39.7 % (ref 39.0–52.0)
Hemoglobin: 13.3 g/dL (ref 13.0–17.0)
MCH: 30 pg (ref 26.0–34.0)
MCHC: 33.5 g/dL (ref 30.0–36.0)
MCV: 89.6 fL (ref 78.0–100.0)
Monocytes Absolute: 0.3 10*3/uL (ref 0.1–1.0)
Monocytes Relative: 3 % (ref 3–12)

## 2012-09-27 LAB — GLUCOSE, CAPILLARY: Glucose-Capillary: 112 mg/dL — ABNORMAL HIGH (ref 70–99)

## 2012-09-27 LAB — COMPREHENSIVE METABOLIC PANEL
ALT: 27 U/L (ref 0–53)
AST: 30 U/L (ref 0–37)
Alkaline Phosphatase: 48 U/L (ref 39–117)
Calcium: 8.9 mg/dL (ref 8.4–10.5)
Potassium: 4.8 mEq/L (ref 3.5–5.1)
Sodium: 138 mEq/L (ref 135–145)
Total Protein: 5.8 g/dL — ABNORMAL LOW (ref 6.0–8.3)

## 2012-09-27 SURGERY — SIGMOIDOSCOPY, FLEXIBLE
Anesthesia: Moderate Sedation

## 2012-09-27 MED ORDER — MEPERIDINE HCL 100 MG/ML IJ SOLN
INTRAMUSCULAR | Status: DC | PRN
  Administered 2012-09-27 (×2): 25 mg via INTRAVENOUS

## 2012-09-27 MED ORDER — MIDAZOLAM HCL 5 MG/5ML IJ SOLN
INTRAMUSCULAR | Status: AC
  Filled 2012-09-27: qty 5

## 2012-09-27 MED ORDER — MEPERIDINE HCL 100 MG/ML IJ SOLN
INTRAMUSCULAR | Status: AC
  Filled 2012-09-27: qty 1

## 2012-09-27 MED ORDER — MIDAZOLAM HCL 5 MG/5ML IJ SOLN
INTRAMUSCULAR | Status: DC | PRN
  Administered 2012-09-27 (×2): 2 mg via INTRAVENOUS

## 2012-09-27 MED ORDER — STERILE WATER FOR IRRIGATION IR SOLN
Status: DC | PRN
  Administered 2012-09-27: 10:00:00

## 2012-09-27 MED ORDER — ATROPINE SULFATE 1 MG/ML IJ SOLN
1.0000 mg | Freq: Once | INTRAMUSCULAR | Status: AC | PRN
  Filled 2012-09-27: qty 1

## 2012-09-27 MED ORDER — NEOSTIGMINE METHYLSULFATE 1 MG/ML IJ SOLN
2.0000 mg | Freq: Once | INTRAMUSCULAR | Status: AC
  Administered 2012-09-27: 2 mg via INTRAVENOUS
  Filled 2012-09-27: qty 2

## 2012-09-27 MED ORDER — ATROPINE SULFATE 0.1 MG/ML IJ SOLN
INTRAMUSCULAR | Status: AC
  Administered 2012-09-27: 1 mg
  Filled 2012-09-27: qty 10

## 2012-09-27 NOTE — H&P (Signed)
Primary Care Physician:  Fredirick Maudlin, MD Primary Gastroenterologist:  Dr. Darrick Penna  Pre-Procedure History & Physical: HPI:  Connor Armstrong is a 62 y.o. male here for ILEUS-DISTENDED CECUM.  Past Medical History  Diagnosis Date  . COPD (chronic obstructive pulmonary disease)   . Hypertension   . Fibromyalgia   . Shingles   . Cancer     kidney    Past Surgical History  Procedure Date  . Hernia repair   . Thyroid surgery   . Parathyroid surgery   . Kidney surgery     Prior to Admission medications   Medication Sig Start Date End Date Taking? Authorizing Provider  acetaminophen (TYLENOL) 500 MG tablet Take 1,000 mg by mouth every 6 (six) hours as needed. For pain    Yes Historical Provider, MD  albuterol (PROVENTIL HFA;VENTOLIN HFA) 108 (90 BASE) MCG/ACT inhaler Inhale 2 puffs into the lungs every 6 (six) hours as needed. For shortness of breath    Yes Historical Provider, MD  albuterol (PROVENTIL) (2.5 MG/3ML) 0.083% nebulizer solution Take 3 mLs (2.5 mg total) by nebulization every 6 (six) hours as needed for wheezing. 12/14/11 12/13/12 Yes Fredirick Maudlin, MD  aspirin 81 MG chewable tablet Chew 81 mg by mouth daily.   Yes Historical Provider, MD  cyclobenzaprine (FLEXERIL) 10 MG tablet Take 10 mg by mouth 2 (two) times daily.     Yes Historical Provider, MD  dextromethorphan-guaiFENesin (MUCINEX DM) 30-600 MG per 12 hr tablet Take 1 tablet by mouth every 12 (twelve) hours as needed. For congestion   Yes Historical Provider, MD  finasteride (PROSCAR) 5 MG tablet Take 5 mg by mouth daily.   Yes Historical Provider, MD  Fluticasone-Salmeterol (ADVAIR) 250-50 MCG/DOSE AEPB Inhale 1 puff into the lungs every 12 (twelve) hours.     Yes Historical Provider, MD  gabapentin (NEURONTIN) 300 MG capsule Take 300 mg by mouth at bedtime.   Yes Historical Provider, MD  HYDROcodone-acetaminophen (NORCO) 5-325 MG per tablet Take 1 tablet by mouth every 6 (six) hours as needed. For shingles pain    Yes Historical Provider, MD  lisinopril-hydrochlorothiazide (PRINZIDE,ZESTORETIC) 20-12.5 MG per tablet Take 0.5 tablets by mouth daily.     Yes Historical Provider, MD  Milnacipran (SAVELLA) 50 MG TABS Take 50 mg by mouth 2 (two) times daily.     Yes Historical Provider, MD  roflumilast (DALIRESP) 500 MCG TABS tablet Take 1 tablet (500 mcg total) by mouth daily. 12/14/11  Yes Fredirick Maudlin, MD  tadalafil (CIALIS) 5 MG tablet Take 5 mg by mouth daily.    Yes Historical Provider, MD  tiotropium (SPIRIVA) 18 MCG inhalation capsule Place 18 mcg into inhaler and inhale daily.     Yes Historical Provider, MD    Allergies as of 09/18/2012  . (No Known Allergies)    Family History  Problem Relation Age of Onset  . Hypertension Mother     History   Social History  . Marital Status: Married    Spouse Name: N/A    Number of Children: N/A  . Years of Education: N/A   Occupational History  . Not on file.   Social History Main Topics  . Smoking status: Current Every Day Smoker -- 1.0 packs/day  . Smokeless tobacco: Not on file  . Alcohol Use: No  . Drug Use: No  . Sexually Active: Not Currently   Other Topics Concern  . Not on file   Social History Narrative  . No narrative on  file    Review of Systems: See HPI, otherwise negative ROS   Physical Exam: BP 168/93  Pulse 93  Temp 97.6 F (36.4 C) (Axillary)  Resp 19  Ht 5\' 9"  (1.753 m)  Wt 197 lb 1.5 oz (89.4 kg)  BMI 29.11 kg/m2  SpO2 93% General:   Alert,  pleasant and cooperative in NAD Head:  Normocephalic and atraumatic. Neck:  Supple; Lungs:  Clear throughout to auscultation.    Heart:  Regular rate and rhythm. Abdomen:  Soft, nontender and nondistended. Normal bowel sounds, without guarding, and without rebound.   Neurologic:  Alert and  oriented x4;  grossly normal neurologically.  Impression/Plan:    Ileus with distended cecum  PLAN: FLEX SIG TO ASPIRATE AIR. CONTINUE FLEXISEAL FOR ANOTHER 24 HOURS

## 2012-09-27 NOTE — Progress Notes (Signed)
Subjective: He says he is doing okay as far as his breathing is concerned but he still has abdominal distention. He feels like his abdominal distention is better. He has produced some stool via rectal tube. However his x-ray is unchanged  Objective: Vital signs in last 24 hours: Temp:  [97.6 F (36.4 C)-99.8 F (37.7 C)] 97.6 F (36.4 C) (01/18 0400) Pulse Rate:  [70-80] 70  (01/18 0408) Resp:  [11-19] 16  (01/18 0500) BP: (145-189)/(80-107) 189/103 mmHg (01/18 0500) SpO2:  [92 %-96 %] 92 % (01/18 0759) FiO2 (%):  [28 %] 28 % (01/18 0744) Weight:  [89.4 kg (197 lb 1.5 oz)] 89.4 kg (197 lb 1.5 oz) (01/18 0500) Weight change: 2.1 kg (4 lb 10.1 oz) Last BM Date: 09/26/12  Intake/Output from previous day: 01/17 0701 - 01/18 0700 In: 3255 [P.O.:880; I.V.:1525; IV Piggyback:850] Out: 1150 [Urine:1150]  PHYSICAL EXAM General appearance: alert, cooperative and mild distress Resp: clear to auscultation bilaterally Cardio: regular rate and rhythm, S1, S2 normal, no murmur, click, rub or gallop GI: Markedly distended but nontender Extremities: extremities normal, atraumatic, no cyanosis or edema  Lab Results:    Basic Metabolic Panel:  Basename 09/27/12 0530 09/26/12 0900  NA 138 139  K 4.8 4.1  CL 96 97  CO2 36* 35*  GLUCOSE 102* 118*  BUN 41* 35*  CREATININE 1.14 1.09  CALCIUM 8.9 8.3*  MG -- 2.4  PHOS -- --   Liver Function Tests:  Basename 09/27/12 0530 09/25/12 0435  AST 30 14  ALT 27 14  ALKPHOS 48 44  BILITOT 0.6 0.3  PROT 5.8* 4.6*  ALBUMIN 2.8* 2.1*   No results found for this basename: LIPASE:2,AMYLASE:2 in the last 72 hours No results found for this basename: AMMONIA:2 in the last 72 hours CBC:  Basename 09/27/12 0530 09/26/12 0900  WBC 9.9 8.6  NEUTROABS 9.2* 8.3*  HGB 13.3 12.6*  HCT 39.7 38.0*  MCV 89.6 90.0  PLT 222 211   Cardiac Enzymes: No results found for this basename: CKTOTAL:3,CKMB:3,CKMBINDEX:3,TROPONINI:3 in the last 72  hours BNP: No results found for this basename: PROBNP:3 in the last 72 hours D-Dimer: No results found for this basename: DDIMER:2 in the last 72 hours CBG:  Basename 09/27/12 0746 09/26/12 2105 09/26/12 1645 09/26/12 1122 09/26/12 0723 09/25/12 2140  GLUCAP 109* 100* 116* 115* 92 110*   Hemoglobin A1C: No results found for this basename: HGBA1C in the last 72 hours Fasting Lipid Panel: No results found for this basename: CHOL,HDL,LDLCALC,TRIG,CHOLHDL,LDLDIRECT in the last 72 hours Thyroid Function Tests: No results found for this basename: TSH,T4TOTAL,FREET4,T3FREE,THYROIDAB in the last 72 hours Anemia Panel: No results found for this basename: VITAMINB12,FOLATE,FERRITIN,TIBC,IRON,RETICCTPCT in the last 72 hours Coagulation: No results found for this basename: LABPROT:2,INR:2 in the last 72 hours Urine Drug Screen: Drugs of Abuse  No results found for this basename: labopia, cocainscrnur, labbenz, amphetmu, thcu, labbarb    Alcohol Level: No results found for this basename: ETH:2 in the last 72 hours Urinalysis: No results found for this basename: COLORURINE:2,APPERANCEUR:2,LABSPEC:2,PHURINE:2,GLUCOSEU:2,HGBUR:2,BILIRUBINUR:2,KETONESUR:2,PROTEINUR:2,UROBILINOGEN:2,NITRITE:2,LEUKOCYTESUR:2 in the last 72 hours Misc. Labs:  ABGS  Basename 09/25/12 0930  PHART 7.453*  PO2ART 64.9*  TCO2 34.2  HCO3 38.4*   CULTURES Recent Results (from the past 240 hour(s))  MRSA PCR SCREENING     Status: Normal   Collection Time   09/18/12  2:55 PM      Component Value Range Status Comment   MRSA by PCR NEGATIVE  NEGATIVE Final    Studies/Results:  Dg Abd 1 View  09/27/2012  *RADIOLOGY REPORT*  Clinical Data: Pre-procedure colonoscopy, ileus  ABDOMEN - 1 VIEW  Comparison: Prior chest x-ray 09/26/2012  Findings: Single abdominal radiograph demonstrates relatively unchanged diffuse gaseous distension of both large and small bowel throughout the abdomen.  Gas is noted within the rectum.  Incidentally, there is bilateral hydronephrosis identified by residual contrast material within the renal collecting systems. Radiopacity in the left renal collecting systems suggest nephrolithiasis.  No acute osseous abnormality.  IMPRESSION:  1.  No significant interval change in the degree of diffuse gaseous distension of large and small bowel throughout the abdomen most consistent with adynamic ileus.  2.  Bilateral hydronephrosis and left and nephrolithiasis.   Original Report Authenticated By: Malachy Moan, M.D.    Dg Abd 1 View  09/26/2012  *RADIOLOGY REPORT*  Clinical Data: Ileus.  ABDOMEN - 1 VIEW  Comparison: CT of earlier today  Findings: Single supine view of the abdomen and pelvis.  High abdomen and low pelvis excluded.  Similar diffuse large and small bowel dilatation.  Retained contrast within an obstructed left renal collecting system, moderate.  No pneumatosis.  Limited sensitivity for free intraperitoneal air. Contrast within the pelvis is favored to be in the sigmoid colon.  IMPRESSION: Similar large and small bowel dilatation, likely ileus.  Left-sided hydronephrosis with retained contrast in the dilated collecting system.   Original Report Authenticated By: Jeronimo Greaves, M.D.    Ct Abdomen Pelvis W Contrast  09/26/2012  *RADIOLOGY REPORT*  Clinical Data: Abdominal pain and distention.  CT ABDOMEN AND PELVIS WITH CONTRAST  Technique:  Multidetector CT imaging of the abdomen and pelvis was performed following the standard protocol during bolus administration of intravenous contrast.  Contrast:  OMNIPAQUE IOHEXOL 300 MG/ML  SOLN  Comparison: Noncontrast CT on 08/28/2010  Findings: Diffuse colonic dilatation is seen with air-fluid levels, however there is no evidence of an obstructing lesion involving the distal colon.  This is consistent with a severe colonic ileus. There is no evidence of dilated small bowel loops.  Diffuse body wall and mesenteric edema is noted as well as small  bilateral pleural effusions and bibasilar atelectasis.  Foley catheter is seen within the bladder.  A small right inguinal hernia is again noted.  No evidence of focal inflammatory process or abscess within the abdomen or pelvis.  Severe right hydronephrosis has increased since previous study. There is diffuse right renal parenchymal atrophy.  No obstructing calculus or mass identified.  A nonobstructing calculus is noted in the left renal pelvis which measures 6 mm.  No ureteral calculi or dilatation identified.  The liver, gallbladder, spleen, pancreas, and adrenal glands are normal in appearance.  No soft tissue masses or lymphadenopathy identified.  IMPRESSION:  1.  Severe colonic ileus.  No mass or inflammatory process identified. 2.  Diffuse body wall and mesenteric edema, and small bilateral pleural effusions. 3.  Severe right hydronephrosis and right renal parenchymal atrophy.  No obstructing etiology visualized by CT.  A congenital UPJ obstruction cannot be excluded. 4.  6 mm nonobstructing calculus in the left renal pelvis. 5.  Stable small right inguinal hernia.   Original Report Authenticated By: Myles Rosenthal, M.D.     Medications:  Prior to Admission:  Prescriptions prior to admission  Medication Sig Dispense Refill  . acetaminophen (TYLENOL) 500 MG tablet Take 1,000 mg by mouth every 6 (six) hours as needed. For pain       . albuterol (PROVENTIL HFA;VENTOLIN HFA) 108 (  90 BASE) MCG/ACT inhaler Inhale 2 puffs into the lungs every 6 (six) hours as needed. For shortness of breath       . albuterol (PROVENTIL) (2.5 MG/3ML) 0.083% nebulizer solution Take 3 mLs (2.5 mg total) by nebulization every 6 (six) hours as needed for wheezing.  75 mL  12  . aspirin 81 MG chewable tablet Chew 81 mg by mouth daily.      . cyclobenzaprine (FLEXERIL) 10 MG tablet Take 10 mg by mouth 2 (two) times daily.        Marland Kitchen dextromethorphan-guaiFENesin (MUCINEX DM) 30-600 MG per 12 hr tablet Take 1 tablet by mouth every 12  (twelve) hours as needed. For congestion      . finasteride (PROSCAR) 5 MG tablet Take 5 mg by mouth daily.      . Fluticasone-Salmeterol (ADVAIR) 250-50 MCG/DOSE AEPB Inhale 1 puff into the lungs every 12 (twelve) hours.        . gabapentin (NEURONTIN) 300 MG capsule Take 300 mg by mouth at bedtime.      Marland Kitchen HYDROcodone-acetaminophen (NORCO) 5-325 MG per tablet Take 1 tablet by mouth every 6 (six) hours as needed. For shingles pain      . lisinopril-hydrochlorothiazide (PRINZIDE,ZESTORETIC) 20-12.5 MG per tablet Take 0.5 tablets by mouth daily.        . Milnacipran (SAVELLA) 50 MG TABS Take 50 mg by mouth 2 (two) times daily.        . roflumilast (DALIRESP) 500 MCG TABS tablet Take 1 tablet (500 mcg total) by mouth daily.  30 tablet  12  . tadalafil (CIALIS) 5 MG tablet Take 5 mg by mouth daily.       Marland Kitchen tiotropium (SPIRIVA) 18 MCG inhalation capsule Place 18 mcg into inhaler and inhale daily.         Scheduled:   . albuterol  2.5 mg Nebulization Q4H  . antiseptic oral rinse  15 mL Mouth Rinse QID  . aspirin  81 mg Oral Daily  . chlorhexidine  15 mL Mouth Rinse BID  . cyclobenzaprine  10 mg Oral BID  . enoxaparin (LOVENOX) injection  40 mg Subcutaneous Q24H  . gabapentin  100 mg Oral QHS  . ipratropium  0.5 mg Nebulization Q4H  . levofloxacin (LEVAQUIN) IV  750 mg Intravenous Q24H  . methylPREDNISolone (SOLU-MEDROL) injection  125 mg Intravenous Q6H  . mometasone-formoterol  2 puff Inhalation BID  . pantoprazole (PROTONIX) IV  40 mg Intravenous Q24H  . piperacillin-tazobactam (ZOSYN)  IV  3.375 g Intravenous Q8H  . vancomycin  1,000 mg Intravenous Q12H   Continuous:   . sodium chloride 75 mL/hr at 09/27/12 0400  . albuterol 10 mg/hr (09/18/12 1049)   ZOX:[id number], acetaminophen, albuterol, alum & mag hydroxide-simeth, bisacodyl, fentaNYL, HYDROcodone-acetaminophen, ondansetron (ZOFRAN) IV, ondansetron, sodium chloride, sodium phosphate  Assesment: He was admitted with acute on  chronic respiratory failure. He has been having trouble with ileus and had a markedly distended colon. He has a rectal tube in place and has had some stool and gas produced but his x-ray is unchanged. Principal Problem:  *Acute-on-chronic respiratory failure Active Problems:  Acute exacerbation of chronic obstructive pulmonary disease (COPD)  Hypertension  Fibromyalgia  Myoclonus    Plan: Dr.Ffields will evaluate again and he may need colonoscopy.    LOS: 9 days   Jessina Marse L 09/27/2012, 9:08 AM

## 2012-09-27 NOTE — Op Note (Signed)
[facility] [address] [place], [address]   FLEX SIGMOIDOSCOPY PROCEDURE REPORT  PATIENT: Connor Armstrong, Connor Armstrong  MR#: [id number] BIRTHDATE: [birth year] , 61  yrs. old GENDER: Male ENDOSCOPIST: Jonette Eva, MD REFERRED OZ:[id number] Juanetta Gosling, M.D. PROCEDURE DATE:  09/27/2012 PROCEDURE:   Sigmoidoscopy, screening INDICATIONS:ILEUS.-CECUM 9-12 CM ON CT/PLAIN FILMS MEDICATIONS: Demerol 50 mg IV and Versed 4 mg IV  DESCRIPTION OF PROCEDURE:    Physical exam was performed.  Informed consent was obtained from the patient after explaining the benefits, risks, and alternatives to procedure.  The patient was connected to monitor and placed in left lateral position. Continuous oxygen was provided by nasal cannula and IV medicine administered through an indwelling cannula.  After administration of sedation and rectal exam, the patients rectum was intubated and the     colonoscope was advanced under direct visualization to the cecum.  The scope was removed slowly by carefully examining the color, texture, anatomy, and integrity mucosa on the way out.  The patient was recovered in endoscopy and discharged home in satisfactory condition.    COLON FINDINGS: Diverticulosis was noted.  , SMALL Hemorrhoids were found.  , and LIMITED VIEW OF THE COLON DUE TO RETAINED LIQUID AND FORMED STOOL.  ABLE TO PASS SCOPE TO 50-60 CM FROM THE ANUS.  COLON DILATED AND NO PERISTALSIS OBSERVED.  ABLE TO VISUALIZE MUCOSA AND SAW NO EVIDENCE OF ISCHEMIA OR NECROSIS.  PREP QUALITY: poor  COMPLICATIONS: None  ENDOSCOPIC IMPRESSION: 1.   Diverticulosis was noted 2.   Hemorrhoids 3.   LIMITED VIEW OF THE COLON DUE TO RETAINED LIQUID AND FORMED STOOL.  ABLE TO PASS SCOPE TO 50-60 CM FROM THE ANUS.  COLON DILATED AND NO PERISTALSIS OBSERVED.  ABLE TO VISUALIZE MUCOSA AND SAW NO EVIDENCE OF ISCHEMIA OR NECROSIS.  AIR ASPIRATED  UPON WITHDRAWING THE SCOPE.  RECOMMENDATIONS: ILEUS UNCHANGED AFTER  FLEXI-SEAL TO iLWS NO EVIDENCE FOR ISCHEMIA OR NECROSIS ON EXAM OF LEFT COLON PT HAS RESPONDED TO ?NEOSTIGMINE IN THE PAST WHEN HE HAD AN ILEUS AFTER KIDNEY SURGERY.  NEOSTIGMINE 2 MG IV WITH ATROPINE AT THE BEDSIDE.  DISCUSSED WITH RN.       _______________________________ Rosalie DoctorJonette Eva, MD 09/27/2012 11:35 AM

## 2012-09-27 NOTE — Progress Notes (Signed)
FLEXISEAL HAS BEEN REMOVED AND OR TEAM  AND DR FIELDS IN PT'S ROOM. FLEXIBLE SIGMOID STARTED AT BEDSIDE. PT IS ON HIS BIPAP AT DR. FIELDS REQUEST. R.T. ON STANDBY IN CASE OF EMERGENCY. WIFE IN WAITING ROOM.

## 2012-09-28 ENCOUNTER — Inpatient Hospital Stay (HOSPITAL_COMMUNITY): Payer: Medicare Other

## 2012-09-28 ENCOUNTER — Other Ambulatory Visit (HOSPITAL_COMMUNITY): Payer: Medicare Other

## 2012-09-28 DIAGNOSIS — K560 Paralytic ileus: Secondary | ICD-10-CM | POA: Diagnosis not present

## 2012-09-28 DIAGNOSIS — N5089 Other specified disorders of the male genital organs: Secondary | ICD-10-CM | POA: Diagnosis not present

## 2012-09-28 LAB — GLUCOSE, CAPILLARY: Glucose-Capillary: 124 mg/dL — ABNORMAL HIGH (ref 70–99)

## 2012-09-28 LAB — BASIC METABOLIC PANEL
BUN: 41 mg/dL — ABNORMAL HIGH (ref 6–23)
CO2: 34 mEq/L — ABNORMAL HIGH (ref 19–32)
Chloride: 95 mEq/L — ABNORMAL LOW (ref 96–112)
Glucose, Bld: 112 mg/dL — ABNORMAL HIGH (ref 70–99)
Potassium: 4.3 mEq/L (ref 3.5–5.1)
Sodium: 135 mEq/L (ref 135–145)

## 2012-09-28 MED ORDER — METHYLPREDNISOLONE SODIUM SUCC 40 MG IJ SOLR
40.0000 mg | Freq: Four times a day (QID) | INTRAMUSCULAR | Status: DC
  Administered 2012-09-28 – 2012-09-29 (×4): 40 mg via INTRAVENOUS
  Filled 2012-09-28 (×4): qty 1

## 2012-09-28 MED ORDER — GABAPENTIN 100 MG PO CAPS
200.0000 mg | ORAL_CAPSULE | Freq: Every day | ORAL | Status: DC
  Administered 2012-09-28 – 2012-09-30 (×3): 200 mg via ORAL
  Filled 2012-09-28 (×4): qty 2

## 2012-09-28 MED ORDER — SODIUM CHLORIDE 0.9 % IV SOLN
INTRAVENOUS | Status: DC
  Administered 2012-09-29: 1000 mL via INTRAVENOUS

## 2012-09-28 MED ORDER — FENTANYL CITRATE 0.05 MG/ML IJ SOLN: 25.0000 ug | Freq: Four times a day (QID) | INTRAMUSCULAR | Status: DC | PRN

## 2012-09-28 MED ORDER — FUROSEMIDE 10 MG/ML IJ SOLN
40.0000 mg | Freq: Once | INTRAMUSCULAR | Status: AC
  Administered 2012-09-28: 40 mg via INTRAVENOUS

## 2012-09-28 MED ORDER — FUROSEMIDE 10 MG/ML IJ SOLN
INTRAMUSCULAR | Status: AC
  Administered 2012-09-28: 40 mg via INTRAVENOUS
  Filled 2012-09-28: qty 4

## 2012-09-28 NOTE — Progress Notes (Signed)
Subjective: He says he feels better. He has no new complaints. He has been passing gas but has not had a bowel movement. He is generally feeling better. His flat plate abdomen looks to me like he still markedly distended but I don't have an official reading yet. His scrotum is swollen.  Objective: Vital signs in last 24 hours: Temp:  [98 F (36.7 C)-98.7 F (37.1 C)] 98.2 F (36.8 C) (01/19 0818) Pulse Rate:  [68-93] 68  (01/19 0037) Resp:  [14-22] 15  (01/19 0600) BP: (120-171)/(73-105) 137/82 mmHg (01/19 0600) SpO2:  [93 %-98 %] 95 % (01/19 0713) FiO2 (%):  [28 %-35 %] 28 % (01/19 0035) Weight:  [88.9 kg (195 lb 15.8 oz)] 88.9 kg (195 lb 15.8 oz) (01/19 0500) Weight change: -0.5 kg (-1 lb 1.6 oz) Last BM Date: 09/27/12  Intake/Output from previous day: 01/18 0701 - 01/19 0700 In: 2105 [P.O.:30; I.V.:1425; IV Piggyback:650] Out: 1550 [Urine:1550]  PHYSICAL EXAM General appearance: alert, cooperative and mild distress Resp: rhonchi bilaterally Cardio: regular rate and rhythm, S1, S2 normal, no murmur, click, rub or gallop GI: Still very distended but less than yesterday. His scrotum is edematous Extremities: extremities normal, atraumatic, no cyanosis or edema  Lab Results:    Basic Metabolic Panel:  Basename 09/27/12 0530 09/26/12 0900  NA 138 139  K 4.8 4.1  CL 96 97  CO2 36* 35*  GLUCOSE 102* 118*  BUN 41* 35*  CREATININE 1.14 1.09  CALCIUM 8.9 8.3*  MG -- 2.4  PHOS -- --   Liver Function Tests:  Basename 09/27/12 0530  AST 30  ALT 27  ALKPHOS 48  BILITOT 0.6  PROT 5.8*  ALBUMIN 2.8*   No results found for this basename: LIPASE:2,AMYLASE:2 in the last 72 hours No results found for this basename: AMMONIA:2 in the last 72 hours CBC:  Basename 09/27/12 0530 09/26/12 0900  WBC 9.9 8.6  NEUTROABS 9.2* 8.3*  HGB 13.3 12.6*  HCT 39.7 38.0*  MCV 89.6 90.0  PLT 222 211   Cardiac Enzymes: No results found for this basename:  CKTOTAL:3,CKMB:3,CKMBINDEX:3,TROPONINI:3 in the last 72 hours BNP: No results found for this basename: PROBNP:3 in the last 72 hours D-Dimer: No results found for this basename: DDIMER:2 in the last 72 hours CBG:  Basename 09/28/12 0740 09/27/12 2208 09/27/12 1638 09/27/12 1127 09/27/12 0746 09/26/12 2105  GLUCAP 107* 112* 96 96 109* 100*   Hemoglobin A1C: No results found for this basename: HGBA1C in the last 72 hours Fasting Lipid Panel: No results found for this basename: CHOL,HDL,LDLCALC,TRIG,CHOLHDL,LDLDIRECT in the last 72 hours Thyroid Function Tests: No results found for this basename: TSH,T4TOTAL,FREET4,T3FREE,THYROIDAB in the last 72 hours Anemia Panel: No results found for this basename: VITAMINB12,FOLATE,FERRITIN,TIBC,IRON,RETICCTPCT in the last 72 hours Coagulation: No results found for this basename: LABPROT:2,INR:2 in the last 72 hours Urine Drug Screen: Drugs of Abuse  No results found for this basename: labopia, cocainscrnur, labbenz, amphetmu, thcu, labbarb    Alcohol Level: No results found for this basename: ETH:2 in the last 72 hours Urinalysis: No results found for this basename: COLORURINE:2,APPERANCEUR:2,LABSPEC:2,PHURINE:2,GLUCOSEU:2,HGBUR:2,BILIRUBINUR:2,KETONESUR:2,PROTEINUR:2,UROBILINOGEN:2,NITRITE:2,LEUKOCYTESUR:2 in the last 72 hours Misc. Labs:  ABGS  Basename 09/25/12 0930  PHART 7.453*  PO2ART 64.9*  TCO2 34.2  HCO3 38.4*   CULTURES Recent Results (from the past 240 hour(s))  MRSA PCR SCREENING     Status: Normal   Collection Time   09/18/12  2:55 PM      Component Value Range Status Comment   MRSA by  PCR NEGATIVE  NEGATIVE Final    Studies/Results: Dg Abd 1 View  09/27/2012  *RADIOLOGY REPORT*  Clinical Data: Pre-procedure colonoscopy, ileus  ABDOMEN - 1 VIEW  Comparison: Prior chest x-ray 09/26/2012  Findings: Single abdominal radiograph demonstrates relatively unchanged diffuse gaseous distension of both large and small bowel  throughout the abdomen.  Gas is noted within the rectum. Incidentally, there is bilateral hydronephrosis identified by residual contrast material within the renal collecting systems. Radiopacity in the left renal collecting systems suggest nephrolithiasis.  No acute osseous abnormality.  IMPRESSION:  1.  No significant interval change in the degree of diffuse gaseous distension of large and small bowel throughout the abdomen most consistent with adynamic ileus.  2.  Bilateral hydronephrosis and left and nephrolithiasis.   Original Report Authenticated By: Malachy Moan, M.D.    Dg Abd 1 View  09/26/2012  *RADIOLOGY REPORT*  Clinical Data: Ileus.  ABDOMEN - 1 VIEW  Comparison: CT of earlier today  Findings: Single supine view of the abdomen and pelvis.  High abdomen and low pelvis excluded.  Similar diffuse large and small bowel dilatation.  Retained contrast within an obstructed left renal collecting system, moderate.  No pneumatosis.  Limited sensitivity for free intraperitoneal air. Contrast within the pelvis is favored to be in the sigmoid colon.  IMPRESSION: Similar large and small bowel dilatation, likely ileus.  Left-sided hydronephrosis with retained contrast in the dilated collecting system.   Original Report Authenticated By: Jeronimo Greaves, M.D.    Ct Abdomen Pelvis W Contrast  09/26/2012  *RADIOLOGY REPORT*  Clinical Data: Abdominal pain and distention.  CT ABDOMEN AND PELVIS WITH CONTRAST  Technique:  Multidetector CT imaging of the abdomen and pelvis was performed following the standard protocol during bolus administration of intravenous contrast.  Contrast:  OMNIPAQUE IOHEXOL 300 MG/ML  SOLN  Comparison: Noncontrast CT on 08/28/2010  Findings: Diffuse colonic dilatation is seen with air-fluid levels, however there is no evidence of an obstructing lesion involving the distal colon.  This is consistent with a severe colonic ileus. There is no evidence of dilated small bowel loops.  Diffuse  body wall and mesenteric edema is noted as well as small bilateral pleural effusions and bibasilar atelectasis.  Foley catheter is seen within the bladder.  A small right inguinal hernia is again noted.  No evidence of focal inflammatory process or abscess within the abdomen or pelvis.  Severe right hydronephrosis has increased since previous study. There is diffuse right renal parenchymal atrophy.  No obstructing calculus or mass identified.  A nonobstructing calculus is noted in the left renal pelvis which measures 6 mm.  No ureteral calculi or dilatation identified.  The liver, gallbladder, spleen, pancreas, and adrenal glands are normal in appearance.  No soft tissue masses or lymphadenopathy identified.  IMPRESSION:  1.  Severe colonic ileus.  No mass or inflammatory process identified. 2.  Diffuse body wall and mesenteric edema, and small bilateral pleural effusions. 3.  Severe right hydronephrosis and right renal parenchymal atrophy.  No obstructing etiology visualized by CT.  A congenital UPJ obstruction cannot be excluded. 4.  6 mm nonobstructing calculus in the left renal pelvis. 5.  Stable small right inguinal hernia.   Original Report Authenticated By: Myles Rosenthal, M.D.     Medications:  Scheduled:   . albuterol  2.5 mg Nebulization Q4H  . antiseptic oral rinse  15 mL Mouth Rinse QID  . aspirin  81 mg Oral Daily  . chlorhexidine  15 mL Mouth  Rinse BID  . enoxaparin (LOVENOX) injection  40 mg Subcutaneous Q24H  . gabapentin  100 mg Oral QHS  . ipratropium  0.5 mg Nebulization Q4H  . levofloxacin (LEVAQUIN) IV  750 mg Intravenous Q24H  . methylPREDNISolone (SOLU-MEDROL) injection  40 mg Intravenous Q6H  . mometasone-formoterol  2 puff Inhalation BID  . pantoprazole (PROTONIX) IV  40 mg Intravenous Q24H  . piperacillin-tazobactam (ZOSYN)  IV  3.375 g Intravenous Q8H  . vancomycin  1,000 mg Intravenous Q12H   Continuous:   . sodium chloride 75 mL/hr at 09/28/12 0800  . albuterol 10  mg/hr (09/18/12 1049)   AVW:[id number], acetaminophen, albuterol, alum & mag hydroxide-simeth, bisacodyl, fentaNYL, HYDROcodone-acetaminophen, ondansetron (ZOFRAN) IV, ondansetron, sodium chloride, sodium phosphate  Assesment: He has acute on chronic respiratory failure. He has ileus which is still a problem. He has advanced COPD with chronic respiratory failure. His blood pressure is up a little bit today. He has swelling of his scrotum. He has severe fibromyalgia Principal Problem:  *Acute-on-chronic respiratory failure Active Problems:  Acute exacerbation of chronic obstructive pulmonary disease (COPD)  Hypertension  Fibromyalgia  Myoclonus    Plan: he's going to use a scrotal elevator. He will have a dose of Lasix. Continue other treatments.    LOS: 10 days   Shiza Thelen L 09/28/2012, 8:55 AM

## 2012-09-28 NOTE — Plan of Care (Signed)
Problem: Phase I Progression Outcomes Goal: O2 sats > or equal 90% or at baseline Outcome: Progressing Wearing CPAP at night,  3 liters at present, 98-100% oxygen saturation Goal: Dyspnea controlled at rest Outcome: Progressing Improvement in dyspnea noted, tremors remain bilateral arms Goal: Pain controlled Outcome: Progressing Mild pain requiring no medication, to right hip and leg Goal: Pt OOB to Walk or Exercise Daily With Nursing or PT Patient OOB to walk or exercise daily with nursing or PT if activity order permits  Encouraged to get oob tonight.  Had finished bath before shift change and declined to ambulate now. Goal: Discharge plan established Outcome: Progressing Home with wife Goal: Tolerating diet Outcome: Progressing Advanced to full liquids today, tolerating diet.

## 2012-09-28 NOTE — Progress Notes (Addendum)
Subjective: Since I last evaluated the patient he has no pain meds since yesterday. Abd pain 3/10. Pain improved. Mild nausea. No vomiting. Passing gas.  Objective: Vital signs in last 24 hours: Temp:  [98 F (36.7 C)-98.7 F (37.1 C)] 98.2 F (36.8 C) (01/19 0818) Pulse Rate:  [68-84] 68  (01/19 0037) Resp:  [14-22] 16  (01/19 0900) BP: (120-167)/(73-105) 146/78 mmHg (01/19 0900) SpO2:  [94 %-98 %] 95 % (01/19 0713) FiO2 (%):  [28 %-35 %] 28 % (01/19 0035) Weight:  [195 lb 15.8 oz (88.9 kg)] 195 lb 15.8 oz (88.9 kg) (01/19 0500) Last BM Date: 09/27/12 (before flexiseal removed for procedure)  Intake/Output from previous day: 01/18 0701 - 01/19 0700 In: 2180 [P.O.:30; I.V.:1500; IV Piggyback:650] Out: 1550 [Urine:1550] Intake/Output this shift: Total I/O In: 79 [I.V.:75; IV Piggyback:4] Out: -   General appearance: alert, cooperative and no distress Resp: rales bilaterally THAT CLEAR WITH COUGH. FAIR AIR MOVEMENT Cardio: regular rate and rhythm GI: BS PRESENT, distended-MILD TTP x4  Lab Results:  Basename 09/27/12 0530 09/26/12 0900  WBC 9.9 8.6  HGB 13.3 12.6*  HCT 39.7 38.0*  PLT 222 211   BMET  Basename 09/28/12 0857 09/27/12 0530 09/26/12 0900  NA 135 138 139  K 4.3 4.8 4.1  CL 95* 96 97  CO2 34* 36* 35*  GLUCOSE 112* 102* 118*  BUN 41* 41* 35*  CREATININE 1.04 1.14 1.09  CALCIUM 8.4 8.9 8.3*   LFT  Basename 09/27/12 0530  PROT 5.8*  ALBUMIN 2.8*  AST 30  ALT 27  ALKPHOS 48  BILITOT 0.6  BILIDIR --  IBILI --   PT/INR No results found for this basename: LABPROT:2,INR:2 in the last 72 hours Hepatitis Panel No results found for this basename: HEPBSAG,HCVAB,HEPAIGM,HEPBIGM in the last 72 hours C-Diff No results found for this basename: CDIFFTOX:3 in the last 72 hours Fecal Lactopherrin No results found for this basename: FECLLACTOFRN in the last 72 hours  Studies/Results: I PERSONALLY REVIEWED ABD FILMS WITH DR. KYLE TALBOT-CECUM LESS  DISTENDED Dg Abd 1 View  09/28/2012  *RADIOLOGY REPORT*  Clinical Data: Monitor ileus  ABDOMEN - 1 VIEW  Comparison: Abdominal radiograph 09/27/2012 and CT abdomen pelvis 09/26/2012  Findings:  No significant change in diffuse gaseous distention of the  colon, and likely of small bowel loops as well, compared to the abdominal radiograph of 09/27/2012.  Some oral contrast is seen within the inferior bowel loops.  Evaluation for free intraperitoneal air is markedly limited on the supine view of the abdomen.  Left abdominal focal calcification is unchanged, likely reflects the known left renal stone.  IMPRESSION:  1.  No significant change in ileus pattern. 2.  Left renal stone.   Original Report Authenticated By: Britta Mccreedy, M.D.    Dg Abd 1 View  09/27/2012  *RADIOLOGY REPORT*  Clinical Data: Pre-procedure colonoscopy, ileus  ABDOMEN - 1 VIEW  Comparison: Prior chest x-ray 09/26/2012  Findings: Single abdominal radiograph demonstrates relatively unchanged diffuse gaseous distension of both large and small bowel throughout the abdomen.  Gas is noted within the rectum. Incidentally, there is bilateral hydronephrosis identified by residual contrast material within the renal collecting systems. Radiopacity in the left renal collecting systems suggest nephrolithiasis.  No acute osseous abnormality.  IMPRESSION:  1.  No significant interval change in the degree of diffuse gaseous distension of large and small bowel throughout the abdomen most consistent with adynamic ileus.  2.  Bilateral hydronephrosis and left and nephrolithiasis.  Original Report Authenticated By: Malachy Moan, M.D.    Dg Abd 1 View  09/26/2012  *RADIOLOGY REPORT*  Clinical Data: Ileus.  ABDOMEN - 1 VIEW  Comparison: CT of earlier today  Findings: Single supine view of the abdomen and pelvis.  High abdomen and low pelvis excluded.  Similar diffuse large and small bowel dilatation.  Retained contrast within an obstructed left renal  collecting system, moderate.  No pneumatosis.  Limited sensitivity for free intraperitoneal air. Contrast within the pelvis is favored to be in the sigmoid colon.  IMPRESSION: Similar large and small bowel dilatation, likely ileus.  Left-sided hydronephrosis with retained contrast in the dilated collecting system.   Original Report Authenticated By: Jeronimo Greaves, M.D.    Ct Abdomen Pelvis W Contrast  09/26/2012  *RADIOLOGY REPORT*  Clinical Data: Abdominal pain and distention.  CT ABDOMEN AND PELVIS WITH CONTRAST  Technique:  Multidetector CT imaging of the abdomen and pelvis was performed following the standard protocol during bolus administration of intravenous contrast.  Contrast:  OMNIPAQUE IOHEXOL 300 MG/ML  SOLN  Comparison: Noncontrast CT on 08/28/2010  Findings: Diffuse colonic dilatation is seen with air-fluid levels, however there is no evidence of an obstructing lesion involving the distal colon.  This is consistent with a severe colonic ileus. There is no evidence of dilated small bowel loops.  Diffuse body wall and mesenteric edema is noted as well as small bilateral pleural effusions and bibasilar atelectasis.  Foley catheter is seen within the bladder.  A small right inguinal hernia is again noted.  No evidence of focal inflammatory process or abscess within the abdomen or pelvis.  Severe right hydronephrosis has increased since previous study. There is diffuse right renal parenchymal atrophy.  No obstructing calculus or mass identified.  A nonobstructing calculus is noted in the left renal pelvis which measures 6 mm.  No ureteral calculi or dilatation identified.  The liver, gallbladder, spleen, pancreas, and adrenal glands are normal in appearance.  No soft tissue masses or lymphadenopathy identified.  IMPRESSION:  1.  Severe colonic ileus.  No mass or inflammatory process identified. 2.  Diffuse body wall and mesenteric edema, and small bilateral pleural effusions. 3.  Severe right  hydronephrosis and right renal parenchymal atrophy.  No obstructing etiology visualized by CT.  A congenital UPJ obstruction cannot be excluded. 4.  6 mm nonobstructing calculus in the left renal pelvis. 5.  Stable small right inguinal hernia.   Original Report Authenticated By: Myles Rosenthal, M.D.     Medications: I have reviewed the patient's current medications.  Assessment/Plan: ILEUS DUE TO MEDS/ILLNESS. S/P NEOSTIGMINE 2MG  IV x1. CLINICALLY IMPROVED-PASSING GASS, PAIN IMPROVED, DISTENTION IMPROVED. ABD FILM WITH SLIGHT IMPROVEMENT IN DISTENTION OF THE  CECUM. NO S/SX OF ACUTE ABDOMEN.  PLAN: 1. FULL LIQUID DIET 2. CHANGE FENTANYL TO Q6HPRN 3. INCREASE NEURONTIN TO 200 MG QHS 4. PROTONIX QD 5. NO NEED TO REPEAT NEOSTIGMINE AT THIS TIME.   LOS: 10 days   [facility] 09/28/2012, 10:39 AM

## 2012-09-28 NOTE — Plan of Care (Signed)
Problem: Phase II Progression Outcomes Goal: Pain controlled on oral analgesia Outcome: Completed/Met Date Met:  09/28/12 Po pain medication.  Denies need for pain medication tonight Goal: Activity at appropriate level-compared to baseline (UP IN CHAIR FOR HEMODIALYSIS)  Outcome: Progressing OOB to chair today, up again tonight

## 2012-09-29 DIAGNOSIS — K560 Paralytic ileus: Secondary | ICD-10-CM

## 2012-09-29 LAB — BASIC METABOLIC PANEL
BUN: 36 mg/dL — ABNORMAL HIGH (ref 6–23)
CO2: 39 mEq/L — ABNORMAL HIGH (ref 19–32)
Chloride: 94 mEq/L — ABNORMAL LOW (ref 96–112)
Creatinine, Ser: 1.12 mg/dL (ref 0.50–1.35)
GFR calc Af Amer: 80 mL/min — ABNORMAL LOW (ref >90)

## 2012-09-29 LAB — GLUCOSE, CAPILLARY
Glucose-Capillary: 101 mg/dL — ABNORMAL HIGH (ref 70–99)
Glucose-Capillary: 146 mg/dL — ABNORMAL HIGH (ref 70–99)

## 2012-09-29 MED ORDER — FUROSEMIDE 10 MG/ML IJ SOLN
INTRAMUSCULAR | Status: AC
  Administered 2012-09-29: 40 mg via INTRAVENOUS
  Filled 2012-09-29: qty 4

## 2012-09-29 MED ORDER — FUROSEMIDE 10 MG/ML IJ SOLN
40.0000 mg | Freq: Once | INTRAMUSCULAR | Status: AC
  Administered 2012-09-29: 40 mg via INTRAVENOUS

## 2012-09-29 MED ORDER — METHYLPREDNISOLONE SODIUM SUCC 40 MG IJ SOLR
40.0000 mg | Freq: Two times a day (BID) | INTRAMUSCULAR | Status: DC
  Administered 2012-09-29 – 2012-10-01 (×4): 40 mg via INTRAVENOUS
  Filled 2012-09-29 (×5): qty 1

## 2012-09-29 MED ORDER — GUAIFENESIN ER 600 MG PO TB12
1200.0000 mg | ORAL_TABLET | Freq: Two times a day (BID) | ORAL | Status: DC
  Administered 2012-09-29 – 2012-10-01 (×5): 1200 mg via ORAL
  Filled 2012-09-29 (×5): qty 2

## 2012-09-29 MED ORDER — PANTOPRAZOLE SODIUM 40 MG PO TBEC
40.0000 mg | DELAYED_RELEASE_TABLET | Freq: Every day | ORAL | Status: DC
  Administered 2012-09-29 – 2012-10-01 (×3): 40 mg via ORAL
  Filled 2012-09-29 (×3): qty 1

## 2012-09-29 MED ORDER — ENSURE COMPLETE PO LIQD
237.0000 mL | Freq: Two times a day (BID) | ORAL | Status: DC
  Administered 2012-09-29 – 2012-09-30 (×3): 237 mL via ORAL

## 2012-09-29 NOTE — Progress Notes (Signed)
NUTRITION FOLLOW UP  Intervention:   Ensure Complete po BID, each supplement provides 350 kcal and 13 grams of protein.  Nutrition Dx:   Inadequate oral intake continues- due to decreased appetite, altered GI function AEB PO: 0-50%.  Goal:   Pt to meet >/= 90% of their estimated nutrition needs- goal not met  Monitor:   PO intake, weight changes, changes in status, labs, skin integrity  Assessment:   Pt very pleasant and upbeat at time of visit. He is very pleased with his progress. He was extubated 09/25/12. He is happy that he is now on solid foods. PO: 0-50%.  Per RN, pt s/p flex sigmoid 09/27/12. Pt has had no BM, however, pt is passing gas and had decreased abdominal distention.  Noted 14# (7.9%) x 1 week, likely due to abdominal distention and fluid retention.   Height: Ht Readings from Last 1 Encounters:  09/18/12 5\' 9"  (1.753 m)    Weight Status:   Wt Readings from Last 1 Encounters:  09/29/12 191 lb 12.8 oz (87 kg)    Re-estimated needs:  Kcal: 1600-1700 daily Protein: 70-87 grams daily Fluid: 1.6-1.7 L daily  Skin: WDL  Diet Order: Cardiac   Intake/Output Summary (Last 24 hours) at 09/29/12 1519 Last data filed at 09/29/12 1410  Gross per 24 hour  Intake   4314 ml  Output   4000 ml  Net    314 ml    Last BM: 09/23/12   Labs:   Lab 09/29/12 0445 09/28/12 0857 09/27/12 0530 09/26/12 0900  NA 137 135 138 --  K 3.6 4.3 4.8 --  CL 94* 95* 96 --  CO2 39* 34* 36* --  BUN 36* 41* 41* --  CREATININE 1.12 1.04 1.14 --  CALCIUM 8.4 8.4 8.9 --  MG -- -- -- 2.4  PHOS -- -- -- --  GLUCOSE 111* 112* 102* --    CBG (last 3)   Basename 09/29/12 1145 09/29/12 0722 09/28/12 2132  GLUCAP 146* 101* 118*    Scheduled Meds:   . albuterol  2.5 mg Nebulization Q4H  . antiseptic oral rinse  15 mL Mouth Rinse QID  . aspirin  81 mg Oral Daily  . chlorhexidine  15 mL Mouth Rinse BID  . enoxaparin (LOVENOX) injection  40 mg Subcutaneous Q24H  . gabapentin  200  mg Oral QHS  . guaiFENesin  1,200 mg Oral BID  . ipratropium  0.5 mg Nebulization Q4H  . methylPREDNISolone (SOLU-MEDROL) injection  40 mg Intravenous Q12H  . mometasone-formoterol  2 puff Inhalation BID  . pantoprazole  40 mg Oral QAC breakfast    Continuous Infusions:   . sodium chloride 50 mL/hr at 09/29/12 1400  . sodium chloride 1,000 mL (09/29/12 0532)  . albuterol 10 mg/hr (09/18/12 1049)   Melody Haver, RD, LDN Pager: [phone]

## 2012-09-29 NOTE — Progress Notes (Signed)
Subjective: Pt c/o abdominal distention, bloating.  Denies any abdominal pain.  Denies N/V.  No BM yet.  Tolerating liquid diet well.  Objective: Vital signs in last 24 hours: Temp:  [97.9 F (36.6 C)-98.7 F (37.1 C)] 97.9 F (36.6 C) (01/20 0500) Pulse Rate:  [69-88] 75  (01/20 0600) Resp:  [12-23] 20  (01/19 2300) BP: (108-147)/(57-90) 127/70 mmHg (01/20 0600) SpO2:  [94 %-100 %] 100 % (01/20 0727) Weight:  [191 lb 12.8 oz (87 kg)] 191 lb 12.8 oz (87 kg) (01/20 0500) Last BM Date: 09/27/12 No LMP for male patient. Body mass index is 28.32 kg/(m^2). General:   Alert,  Well-developed, well-nourished, pleasant and cooperative in NAD Head:  Normocephalic and atraumatic. Eyes:  Sclera clear, no icterus.   Conjunctiva pink. Mouth:  No deformity or lesions, oropharynx pink & moist. Neck:  Supple; no masses or thyromegaly. Heart:  Regular rate and rhythm; no murmurs, clicks, rubs,  or gallops. Abdomen:   Normal bowel sounds.  Soft, nontender.  Mild abdominal distention.  No masses, hepatosplenomegaly or hernias noted.  No guarding or rebound tenderness.   Msk:  Symmetrical without gross deformities. Normal posture. Pulses:  Normal pulses noted. Extremities:  Without edema. Neurologic:  Alert and  oriented x4;  grossly normal neurologically. Skin:  Intact without significant lesions or rashes. Cervical Nodes:  No significant cervical adenopathy. Psych:  Alert and cooperative. Normal mood and affect.  Intake/Output from previous day: 01/19 0701 - 01/20 0700 In: 2869 [P.O.:990; I.V.:1175; IV Piggyback:704] Out: 3650 [Urine:3650]  Lab Results:  [facility] 09/27/12 0530 09/26/12 0900  WBC 9.9 8.6  HGB 13.3 12.6*  HCT 39.7 38.0*  PLT 222 211   BMET  Basename 09/29/12 0445 09/28/12 0857 09/27/12 0530  NA 137 135 138  K 3.6 4.3 4.8  CL 94* 95* 96  CO2 39* 34* 36*  GLUCOSE 111* 112* 102*  BUN 36* 41* 41*  CREATININE 1.12 1.04 1.14  CALCIUM 8.4 8.4 8.9   LFT  Basename  09/27/12 0530  PROT 5.8*  ALBUMIN 2.8*  AST 30  ALT 27  ALKPHOS 48  BILITOT 0.6  BILIDIR --  IBILI --  LIPASE --  AMYLASE --   Studies/Results: Dg Abd 1 View  09/28/2012  *RADIOLOGY REPORT*  Clinical Data: Monitor ileus  ABDOMEN - 1 VIEW  Comparison: Abdominal radiograph 09/27/2012 and CT abdomen pelvis 09/26/2012  Findings:  No significant change in diffuse gaseous distention of the  colon, and likely of small bowel loops as well, compared to the abdominal radiograph of 09/27/2012.  Some oral contrast is seen within the inferior bowel loops.  Evaluation for free intraperitoneal air is markedly limited on the supine view of the abdomen.  Left abdominal focal calcification is unchanged, likely reflects the known left renal stone.  IMPRESSION:  1.  No significant change in ileus pattern. 2.  Left renal stone.   Original Report Authenticated By: Britta Mccreedy, M.D.     Assessment: 1. Colonic ileus secondary to medications/acute illness:  S/p single dose neostigmine, gradually improving, however no BM yet  Plan: 1. Advance to regular diet 2. Encouraged ambulation & position changes as able 3. Continue to follow closely  LOS: 11 days   Suraj Ramdass  09/29/2012, 8:01 AM

## 2012-09-29 NOTE — Progress Notes (Signed)
Subjective: He feels better. His abdomen is better. He was able to tolerate full liquids. He is otherwise about the same. He says he is doing well as far as his breathing is concerned. He is not having as much trouble with his fibromyalgia and muscle  Objective: Vital signs in last 24 hours: Temp:  [97.9 F (36.6 C)-98.7 F (37.1 C)] 97.9 F (36.6 C) (01/20 0500) Pulse Rate:  [69-88] 75  (01/20 0600) Resp:  [12-23] 20  (01/19 2300) BP: (108-147)/(57-90) 127/70 mmHg (01/20 0600) SpO2:  [94 %-100 %] 100 % (01/20 0727) Weight:  [87 kg (191 lb 12.8 oz)] 87 kg (191 lb 12.8 oz) (01/20 0500) Weight change: -1.9 kg (-4 lb 3 oz) Last BM Date: 09/27/12  Intake/Output from previous day: 01/19 0701 - 01/20 0700 In: 2869 [P.O.:990; I.V.:1175; IV Piggyback:704] Out: 3650 [Urine:3650]  PHYSICAL EXAM General appearance: alert, cooperative and mild distress Resp: rhonchi bilaterally Cardio: regular rate and rhythm, S1, S2 normal, no murmur, click, rub or gallop GI: Still minimally distended but much improved Extremities: Trace edema and he still has edema of his scrotum  Lab Results:    Basic Metabolic Panel:  Basename 09/29/12 0445 09/28/12 0857 09/26/12 0900  NA 137 135 --  K 3.6 4.3 --  CL 94* 95* --  CO2 39* 34* --  GLUCOSE 111* 112* --  BUN 36* 41* --  CREATININE 1.12 1.04 --  CALCIUM 8.4 8.4 --  MG -- -- 2.4  PHOS -- -- --   Liver Function Tests:  Basename 09/27/12 0530  AST 30  ALT 27  ALKPHOS 48  BILITOT 0.6  PROT 5.8*  ALBUMIN 2.8*   No results found for this basename: LIPASE:2,AMYLASE:2 in the last 72 hours No results found for this basename: AMMONIA:2 in the last 72 hours CBC:  Basename 09/27/12 0530 09/26/12 0900  WBC 9.9 8.6  NEUTROABS 9.2* 8.3*  HGB 13.3 12.6*  HCT 39.7 38.0*  MCV 89.6 90.0  PLT 222 211   Cardiac Enzymes: No results found for this basename: CKTOTAL:3,CKMB:3,CKMBINDEX:3,TROPONINI:3 in the last 72 hours BNP: No results found for this  basename: PROBNP:3 in the last 72 hours D-Dimer: No results found for this basename: DDIMER:2 in the last 72 hours CBG:  Basename 09/29/12 0722 09/28/12 2132 09/28/12 1653 09/28/12 1148 09/28/12 0740 09/27/12 2208  GLUCAP 101* 118* 110* 124* 107* 112*   Hemoglobin A1C: No results found for this basename: HGBA1C in the last 72 hours Fasting Lipid Panel: No results found for this basename: CHOL,HDL,LDLCALC,TRIG,CHOLHDL,LDLDIRECT in the last 72 hours Thyroid Function Tests: No results found for this basename: TSH,T4TOTAL,FREET4,T3FREE,THYROIDAB in the last 72 hours Anemia Panel: No results found for this basename: VITAMINB12,FOLATE,FERRITIN,TIBC,IRON,RETICCTPCT in the last 72 hours Coagulation: No results found for this basename: LABPROT:2,INR:2 in the last 72 hours Urine Drug Screen: Drugs of Abuse  No results found for this basename: labopia, cocainscrnur, labbenz, amphetmu, thcu, labbarb    Alcohol Level: No results found for this basename: ETH:2 in the last 72 hours Urinalysis: No results found for this basename: COLORURINE:2,APPERANCEUR:2,LABSPEC:2,PHURINE:2,GLUCOSEU:2,HGBUR:2,BILIRUBINUR:2,KETONESUR:2,PROTEINUR:2,UROBILINOGEN:2,NITRITE:2,LEUKOCYTESUR:2 in the last 72 hours Misc. Labs:  ABGS No results found for this basename: PHART,PCO2,PO2ART,TCO2,HCO3 in the last 72 hours CULTURES No results found for this or any previous visit (from the past 240 hour(s)). Studies/Results: Dg Abd 1 View  09/28/2012  *RADIOLOGY REPORT*  Clinical Data: Monitor ileus  ABDOMEN - 1 VIEW  Comparison: Abdominal radiograph 09/27/2012 and CT abdomen pelvis 09/26/2012  Findings:  No significant change in diffuse gaseous  distention of the  colon, and likely of small bowel loops as well, compared to the abdominal radiograph of 09/27/2012.  Some oral contrast is seen within the inferior bowel loops.  Evaluation for free intraperitoneal air is markedly limited on the supine view of the abdomen.  Left  abdominal focal calcification is unchanged, likely reflects the known left renal stone.  IMPRESSION:  1.  No significant change in ileus pattern. 2.  Left renal stone.   Original Report Authenticated By: Britta Mccreedy, M.D.     Medications:  Prior to Admission:  Prescriptions prior to admission  Medication Sig Dispense Refill  . acetaminophen (TYLENOL) 500 MG tablet Take 1,000 mg by mouth every 6 (six) hours as needed. For pain       . albuterol (PROVENTIL HFA;VENTOLIN HFA) 108 (90 BASE) MCG/ACT inhaler Inhale 2 puffs into the lungs every 6 (six) hours as needed. For shortness of breath       . albuterol (PROVENTIL) (2.5 MG/3ML) 0.083% nebulizer solution Take 3 mLs (2.5 mg total) by nebulization every 6 (six) hours as needed for wheezing.  75 mL  12  . aspirin 81 MG chewable tablet Chew 81 mg by mouth daily.      . cyclobenzaprine (FLEXERIL) 10 MG tablet Take 10 mg by mouth 2 (two) times daily.        Marland Kitchen dextromethorphan-guaiFENesin (MUCINEX DM) 30-600 MG per 12 hr tablet Take 1 tablet by mouth every 12 (twelve) hours as needed. For congestion      . finasteride (PROSCAR) 5 MG tablet Take 5 mg by mouth daily.      . Fluticasone-Salmeterol (ADVAIR) 250-50 MCG/DOSE AEPB Inhale 1 puff into the lungs every 12 (twelve) hours.        . gabapentin (NEURONTIN) 300 MG capsule Take 300 mg by mouth at bedtime.      Marland Kitchen HYDROcodone-acetaminophen (NORCO) 5-325 MG per tablet Take 1 tablet by mouth every 6 (six) hours as needed. For shingles pain      . lisinopril-hydrochlorothiazide (PRINZIDE,ZESTORETIC) 20-12.5 MG per tablet Take 0.5 tablets by mouth daily.        . Milnacipran (SAVELLA) 50 MG TABS Take 50 mg by mouth 2 (two) times daily.        . roflumilast (DALIRESP) 500 MCG TABS tablet Take 1 tablet (500 mcg total) by mouth daily.  30 tablet  12  . tadalafil (CIALIS) 5 MG tablet Take 5 mg by mouth daily.       Marland Kitchen tiotropium (SPIRIVA) 18 MCG inhalation capsule Place 18 mcg into inhaler and inhale daily.           Scheduled:   . albuterol  2.5 mg Nebulization Q4H  . antiseptic oral rinse  15 mL Mouth Rinse QID  . aspirin  81 mg Oral Daily  . chlorhexidine  15 mL Mouth Rinse BID  . enoxaparin (LOVENOX) injection  40 mg Subcutaneous Q24H  . gabapentin  200 mg Oral QHS  . guaiFENesin  1,200 mg Oral BID  . ipratropium  0.5 mg Nebulization Q4H  . methylPREDNISolone (SOLU-MEDROL) injection  40 mg Intravenous Q12H  . mometasone-formoterol  2 puff Inhalation BID  . pantoprazole (PROTONIX) IV  40 mg Intravenous Q24H   Continuous:   . sodium chloride 50 mL/hr at 09/29/12 0600  . sodium chloride 1,000 mL (09/29/12 0532)  . albuterol 10 mg/hr (09/18/12 1049)   XBJ:YNWGNFAOZHYQM, acetaminophen, albuterol, alum & mag hydroxide-simeth, bisacodyl, fentaNYL, HYDROcodone-acetaminophen, ondansetron (ZOFRAN) IV, ondansetron, sodium chloride, sodium phosphate  Assesment: He was admitted with respiratory failure which is acute on chronic. He is improving as far as that's concerned. He has severe COPD. He developed paralytic ileus which is improving. He has edema of his scrotum. Principal Problem:  *Acute-on-chronic respiratory failure Active Problems:  Acute exacerbation of chronic obstructive pulmonary disease (COPD)  Hypertension  Fibromyalgia  Myoclonus  Paralytic ileus  Scrotal edema    Plan: He will have more Lasix today probably take the Foley out later today and perhaps move out of the intensive care unit later today. It should be noted that he did not oxygenate as well on BiPAP as he did on a nasal cannula    LOS: 11 days   Ginevra Tacker L 09/29/2012, 8:11 AM

## 2012-09-29 NOTE — Progress Notes (Signed)
Physical Therapy Treatment Patient Details Name: Connor Armstrong MRN: [id number] DOB: Jan 27, 1951 Today's Date: 09/29/2012 Time: 3086-5784 PT Time Calculation (min): 33 min  PT Assessment / Plan / Recommendation Comments on Treatment Session  Pt describes being very limited in mobility at baseline due to severe fibromyalgia...even moderate stretching or active ex causes increased pain the following day.  We are going to need to temper all ther ex, gait with this in mind.  His gait is fairly stable with a walker and he still should be able to transfer to home at d/c.    Follow Up Recommendations  Home health PT     Does the patient have the potential to tolerate intense rehabilitation     Barriers to Discharge        Equipment Recommendations  Rolling walker with 5" wheels    Recommendations for Other Services    Frequency Min 3X/week   Plan Discharge plan remains appropriate;Frequency remains appropriate    Precautions / Restrictions Precautions Precautions: Fall Restrictions Weight Bearing Restrictions: No   Pertinent Vitals/Pain     Mobility  Bed Mobility Bed Mobility: Sit to Supine;Supine to Sit Supine to Sit: 5: Supervision;HOB elevated Sit to Supine: 6: Modified independent (Device/Increase time);HOB elevated Transfers Sit to Stand: 5: Supervision;With upper extremity assist;From bed Ambulation/Gait Ambulation/Gait Assistance: 5: Supervision Ambulation Distance (Feet): 60 Feet Assistive device: Rolling walker Ambulation/Gait Assistance Details: rest taken every 10', standing.  O2 sats fluctuate from mid 80s to mid 90s Gait Pattern: Trunk flexed;Shuffle Gait velocity: slow but steady Stairs: No Wheelchair Mobility Wheelchair Mobility: No    Exercises Other Exercises Other Exercises: bilateral shoulder flexion for trunk stretch x 3 reps Other Exercises: single knee to chest pull x 3 reps each Other Exercises: marching in place Other Exercises: toe raises    PT Diagnosis:    PT Problem List:   PT Treatment Interventions:     PT Goals Acute Rehab PT Goals PT Goal: Supine/Side to Sit - Progress: Progressing toward goal PT Goal: Sit to Supine/Side - Progress: Met PT Goal: Stand - Progress: Progressing toward goal PT Goal: Ambulate - Progress: Progressing toward goal Pt will Go Up / Down Stairs: Other (comment) PT Goal: Up/Down Stairs - Progress: Discontinued (comment)  Visit Information  Last PT Received On: 09/29/12    Subjective Data  Subjective: Pt feels better today...stronger   Cognition  Overall Cognitive Status: Appears within functional limits for tasks assessed/performed Arousal/Alertness: Awake/alert Orientation Level: Appears intact for tasks assessed Behavior During Session: [facility] for tasks performed    Balance     End of Session PT - End of Session Equipment Utilized During Treatment: Gait belt Activity Tolerance: Patient tolerated treatment well Patient left: in bed;with call bell/phone within reach   GP     Myrlene Broker L 09/29/2012, 10:17 AM

## 2012-09-29 NOTE — Progress Notes (Signed)
PT UP AMBULATING IN ROOM W/ STANDBY ASSIST. THEN TO COMMODE FOR 2 FORMED BALL OF STOOL.

## 2012-09-29 NOTE — Progress Notes (Signed)
Pt place on BIPAP for the night per request in report from day shift therapist Broadnax and ICU nurse Vear Clock. There is no order for this Bipap but per pt previous respiratory CO2 history and pt severe COPD I put patient on and night shift nurse Nedra Hai informed of no order and will be address in the am. Pt placed on setting 18/8 with 35% pt tolerating well at the moment with nasal mask, hr 81 spo2 99 rr 18-20/ pt has no compliants and resting. Will check on pt in about 2 hours.

## 2012-09-29 NOTE — Progress Notes (Signed)
Patient ID: Connor Armstrong, male   DOB: [birth year], 62 y.o.   MRN: [id number]  [facility] NEUROLOGY Connor Kiang A. Gerilyn Pilgrim, MD     [email or web address]          Connor Armstrong is an 62 y.o. male.   Assessment/Plan: Myoclonus. This is usually due to toxic metabolic reasons. It has improved although he think that a man got slightly worse. We will continue to observe and continue with the low-dose medications.  The patient has been extubated. He is awake, alert and lucid. Facial muscle strength is symmetric and extra ocular movements are intact. He does have occasional tremors and myoclonus noted. He moves both sides well.     Objective: Vital signs in last 24 hours: Temp:  [97.9 F (36.6 C)-98.1 F (36.7 C)] 98 F (36.7 C) (01/20 1320) Pulse Rate:  [69-109] 85  (01/20 1900) Resp:  [15-21] 20  (01/20 1900) BP: (110-145)/(57-101) 130/75 mmHg (01/20 1900) SpO2:  [92 %-100 %] 97 % (01/20 2029) Weight:  [87 kg (191 lb 12.8 oz)] 87 kg (191 lb 12.8 oz) (01/20 0500)  Intake/Output from previous day: 01/19 0701 - 01/20 0700 In: 2919 [P.O.:990; I.V.:1225; IV Piggyback:704] Out: 3650 [Urine:3650] Intake/Output this shift: Total I/O In: 74.2 [I.V.:74.2] Out: -  Nutritional status: Cardiac   Lab Results: Results for orders placed during the hospital encounter of 09/18/12 (from the past 48 hour(s))  GLUCOSE, CAPILLARY     Status: Abnormal   Collection Time   09/27/12 10:08 PM      Component Value Range Comment   Glucose-Capillary 112 (*) 70 - 99 mg/dL    Comment 1 Documented in Chart      Comment 2 Notify RN     GLUCOSE, CAPILLARY     Status: Abnormal   Collection Time   09/28/12  7:40 AM      Component Value Range Comment   Glucose-Capillary 107 (*) 70 - 99 mg/dL    Comment 1 Documented in Chart      Comment 2 Notify RN     BASIC METABOLIC PANEL     Status: Abnormal   Collection Time   09/28/12  8:57 AM      Component Value Range Comment   Sodium 135  135 - 145 mEq/L    Potassium 4.3  3.5 - 5.1 mEq/L    Chloride 95 (*) 96 - 112 mEq/L    CO2 34 (*) 19 - 32 mEq/L    Glucose, Bld 112 (*) 70 - 99 mg/dL    BUN 41 (*) 6 - 23 mg/dL    Creatinine, Ser 9.56  0.50 - 1.35 mg/dL    Calcium 8.4  8.4 - 21.3 mg/dL    GFR calc non Af Amer 76 (*) >90 mL/min    GFR calc Af Amer 88 (*) >90 mL/min   GLUCOSE, CAPILLARY     Status: Abnormal   Collection Time   09/28/12 11:48 AM      Component Value Range Comment   Glucose-Capillary 124 (*) 70 - 99 mg/dL    Comment 1 Documented in Chart      Comment 2 Notify RN     GLUCOSE, CAPILLARY     Status: Abnormal   Collection Time   09/28/12  4:53 PM      Component Value Range Comment   Glucose-Capillary 110 (*) 70 - 99 mg/dL    Comment 1 Documented in Chart      Comment 2 Notify RN  GLUCOSE, CAPILLARY     Status: Abnormal   Collection Time   09/28/12  9:32 PM      Component Value Range Comment   Glucose-Capillary 118 (*) 70 - 99 mg/dL    Comment 1 Documented in Chart      Comment 2 Notify RN     BASIC METABOLIC PANEL     Status: Abnormal   Collection Time   09/29/12  4:45 AM      Component Value Range Comment   Sodium 137  135 - 145 mEq/L    Potassium 3.6  3.5 - 5.1 mEq/L    Chloride 94 (*) 96 - 112 mEq/L    CO2 39 (*) 19 - 32 mEq/L    Glucose, Bld 111 (*) 70 - 99 mg/dL    BUN 36 (*) 6 - 23 mg/dL    Creatinine, Ser 1.61  0.50 - 1.35 mg/dL    Calcium 8.4  8.4 - 09.6 mg/dL    GFR calc non Af Amer 69 (*) >90 mL/min    GFR calc Af Amer 80 (*) >90 mL/min   GLUCOSE, CAPILLARY     Status: Abnormal   Collection Time   09/29/12  7:22 AM      Component Value Range Comment   Glucose-Capillary 101 (*) 70 - 99 mg/dL    Comment 1 Documented in Chart      Comment 2 Notify RN     GLUCOSE, CAPILLARY     Status: Abnormal   Collection Time   09/29/12 11:45 AM      Component Value Range Comment   Glucose-Capillary 146 (*) 70 - 99 mg/dL    Comment 1 Documented in Chart      Comment 2 Notify RN     GLUCOSE, CAPILLARY     Status:  Abnormal   Collection Time   09/29/12  4:06 PM      Component Value Range Comment   Glucose-Capillary 101 (*) 70 - 99 mg/dL    Comment 1 Notify RN       Lipid Panel No results found for this basename: CHOL,TRIG,HDL,CHOLHDL,VLDL,LDLCALC in the last 72 hours  Studies/Results: Dg Abd 1 View  09/28/2012  *RADIOLOGY REPORT*  Clinical Data: Monitor ileus  ABDOMEN - 1 VIEW  Comparison: Abdominal radiograph 09/27/2012 and CT abdomen pelvis 09/26/2012  Findings:  No significant change in diffuse gaseous distention of the  colon, and likely of small bowel loops as well, compared to the abdominal radiograph of 09/27/2012.  Some oral contrast is seen within the inferior bowel loops.  Evaluation for free intraperitoneal air is markedly limited on the supine view of the abdomen.  Left abdominal focal calcification is unchanged, likely reflects the known left renal stone.  IMPRESSION:  1.  No significant change in ileus pattern. 2.  Left renal stone.   Original Report Authenticated By: Britta Mccreedy, M.D.     Medications:  Scheduled Meds:   . albuterol  2.5 mg Nebulization Q4H  . antiseptic oral rinse  15 mL Mouth Rinse QID  . aspirin  81 mg Oral Daily  . chlorhexidine  15 mL Mouth Rinse BID  . enoxaparin (LOVENOX) injection  40 mg Subcutaneous Q24H  . feeding supplement  237 mL Oral BID BM  . gabapentin  200 mg Oral QHS  . guaiFENesin  1,200 mg Oral BID  . ipratropium  0.5 mg Nebulization Q4H  . methylPREDNISolone (SOLU-MEDROL) injection  40 mg Intravenous Q12H  . mometasone-formoterol  2 puff Inhalation  BID  . pantoprazole  40 mg Oral QAC breakfast   Continuous Infusions:   . sodium chloride 50 mL/hr at 09/29/12 1947  . sodium chloride 1,000 mL (09/29/12 0532)  . albuterol 10 mg/hr (09/18/12 1049)   PRN Meds:.acetaminophen, acetaminophen, albuterol, alum & mag hydroxide-simeth, bisacodyl, fentaNYL, HYDROcodone-acetaminophen, ondansetron (ZOFRAN) IV, ondansetron, sodium chloride, sodium  phosphate     LOS: 11 days   Tenee Wish A. Gerilyn Armstrong, M.D.  Diplomate, Biomedical engineer of Psychiatry and Neurology ( Neurology).

## 2012-09-29 NOTE — Progress Notes (Signed)
REVIEWED.  TOLERATING POS. BS PRESENT(NL), ABD DISTENDED WITH MILD TTP IN LLQ.

## 2012-09-30 ENCOUNTER — Encounter (HOSPITAL_COMMUNITY): Payer: Self-pay | Admitting: Gastroenterology

## 2012-09-30 LAB — GLUCOSE, CAPILLARY
Glucose-Capillary: 80 mg/dL (ref 70–99)
Glucose-Capillary: 124 mg/dL — ABNORMAL HIGH (ref 70–99)

## 2012-09-30 MED ORDER — FUROSEMIDE 10 MG/ML IJ SOLN
40.0000 mg | Freq: Once | INTRAMUSCULAR | Status: AC
  Administered 2012-09-30: 40 mg via INTRAVENOUS
  Filled 2012-09-30: qty 4

## 2012-09-30 MED ORDER — FUROSEMIDE 10 MG/ML IJ SOLN
40.0000 mg | Freq: Two times a day (BID) | INTRAMUSCULAR | Status: DC
  Administered 2012-09-30 – 2012-10-01 (×2): 40 mg via INTRAVENOUS
  Filled 2012-09-30 (×2): qty 4

## 2012-09-30 NOTE — Progress Notes (Signed)
Physical Therapy Treatment Patient Details Name: Connor Armstrong MRN: [id number] DOB: 08-15-1951 Today's Date: 09/30/2012 Time: 4098-1191 PT Time Calculation (min): 26 min  PT Assessment / Plan / Recommendation Comments on Treatment Session  Pt improving activity tolerance, able to ambulate 29' with supervision with RW no rest breaks with O2 sat 91% following therex and gait.  Pt left in chair with call bell within reach and chair alarm set.  O2 sat 96% 5 minutes following gait.      Follow Up Recommendations        Does the patient have the potential to tolerate intense rehabilitation     Barriers to Discharge        Equipment Recommendations       Recommendations for Other Services    Frequency     Plan      Precautions / Restrictions Precautions Precautions: Fall Restrictions Weight Bearing Restrictions: No   Pertinent Vitals/Pain     Mobility  Transfers Transfers: Sit to Stand;Stand to Sit Sit to Stand: 5: Supervision;With upper extremity assist;From bed Stand to Sit: 5: Supervision;With upper extremity assist;To chair/3-in-1 Ambulation/Gait Ambulation/Gait Assistance: 5: Supervision Ambulation Distance (Feet): 96 Feet Assistive device: Rolling walker Gait Pattern: Within Functional Limits Stairs: No Wheelchair Mobility Wheelchair Mobility: No    Exercises Other Exercises Other Exercises: single knee to chest pull x 3 reps each  Other Exercises: heel and toe raises 10 reps   PT Diagnosis:    PT Problem List:   PT Treatment Interventions:     PT Goals Acute Rehab PT Goals PT Goal Formulation: With patient Time For Goal Achievement: 09/26/12 Potential to Achieve Goals: Good Pt will go Supine/Side to Sit: with modified independence PT Goal: Supine/Side to Sit - Progress: Met Pt will go Sit to Supine/Side: with modified independence PT Goal: Sit to Supine/Side - Progress: Met Pt will Stand: with modified independence PT Goal: Stand - Progress:  Progressing toward goal Pt will Ambulate: >150 feet;with modified independence;with least restrictive assistive device PT Goal: Ambulate - Progress: Progressing toward goal PT Goal: Up/Down Stairs - Progress: Discontinued (comment)  Visit Information  Last PT Received On: 09/30/12    Subjective Data  Subjective: Feeling much better today.... alot stronger.  Minimal pain   Cognition  Overall Cognitive Status: Appears within functional limits for tasks assessed/performed Arousal/Alertness: Awake/alert Orientation Level: Appears intact for tasks assessed Behavior During Session: [facility] for tasks performed    Balance     End of Session PT - End of Session Equipment Utilized During Treatment: Gait belt Activity Tolerance: Patient tolerated treatment well Patient left: in chair;with call bell/phone within reach Nurse Communication: Mobility status   GP     Juel Burrow 09/30/2012, 4:18 PM

## 2012-09-30 NOTE — Progress Notes (Signed)
I am going to prescribe BiPAP for him at home. This is for his severe COPD. He does not have sleep apnea and CPAP IS NOT APPROPRIATE FOR HIM

## 2012-09-30 NOTE — Progress Notes (Signed)
Subjective: He did well on BiPAP last night. I had him start on BiPAP basically is something of an experiment because there was some question as to whether it made him worse previously. He has chronic elevation of the CO2 and has been hypoxic and the BiPAP may be helpful to him at home. He says he feels better. His scrotal edema is improved. He still has some edema of his legs and ankles. He has had a bowel movement.  Objective: Vital signs in last 24 hours: Temp:  [97.6 F (36.4 C)-98.3 F (36.8 C)] 97.6 F (36.4 C) (01/21 0400) Pulse Rate:  [65-109] 74  (01/21 0600) Resp:  [13-22] 18  (01/21 0600) BP: (111-145)/(68-101) 137/71 mmHg (01/21 0600) SpO2:  [82 %-100 %] 92 % (01/21 0713) Weight:  [86.6 kg (190 lb 14.7 oz)] 86.6 kg (190 lb 14.7 oz) (01/21 0500) Weight change: -0.4 kg (-14.1 oz) Last BM Date: 09/23/12  Intake/Output from previous day: 01/20 0701 - 01/21 0700 In: 5919 [P.O.:4780; I.V.:1135; IV Piggyback:4] Out: 4500 [Urine:4500]  PHYSICAL EXAM General appearance: alert, cooperative and mild distress Resp: rhonchi bilaterally Cardio: regular rate and rhythm, S1, S2 normal, no murmur, click, rub or gallop GI: soft, non-tender; bowel sounds normal; no masses,  no organomegaly Extremities: edema Trace bilateral  Lab Results:    Basic Metabolic Panel:  Basename 09/29/12 0445 09/28/12 0857  NA 137 135  K 3.6 4.3  CL 94* 95*  CO2 39* 34*  GLUCOSE 111* 112*  BUN 36* 41*  CREATININE 1.12 1.04  CALCIUM 8.4 8.4  MG -- --  PHOS -- --   Liver Function Tests: No results found for this basename: AST:2,ALT:2,ALKPHOS:2,BILITOT:2,PROT:2,ALBUMIN:2 in the last 72 hours No results found for this basename: LIPASE:2,AMYLASE:2 in the last 72 hours No results found for this basename: AMMONIA:2 in the last 72 hours CBC: No results found for this basename: WBC:2,NEUTROABS:2,HGB:2,HCT:2,MCV:2,PLT:2 in the last 72 hours Cardiac Enzymes: No results found for this basename:  CKTOTAL:3,CKMB:3,CKMBINDEX:3,TROPONINI:3 in the last 72 hours BNP: No results found for this basename: PROBNP:3 in the last 72 hours D-Dimer: No results found for this basename: DDIMER:2 in the last 72 hours CBG:  Basename 09/30/12 0733 09/29/12 2143 09/29/12 1606 09/29/12 1145 09/29/12 0722 09/28/12 2132  GLUCAP 80 131* 101* 146* 101* 118*   Hemoglobin A1C: No results found for this basename: HGBA1C in the last 72 hours Fasting Lipid Panel: No results found for this basename: CHOL,HDL,LDLCALC,TRIG,CHOLHDL,LDLDIRECT in the last 72 hours Thyroid Function Tests: No results found for this basename: TSH,T4TOTAL,FREET4,T3FREE,THYROIDAB in the last 72 hours Anemia Panel: No results found for this basename: VITAMINB12,FOLATE,FERRITIN,TIBC,IRON,RETICCTPCT in the last 72 hours Coagulation: No results found for this basename: LABPROT:2,INR:2 in the last 72 hours Urine Drug Screen: Drugs of Abuse  No results found for this basename: labopia, cocainscrnur, labbenz, amphetmu, thcu, labbarb    Alcohol Level: No results found for this basename: ETH:2 in the last 72 hours Urinalysis: No results found for this basename: COLORURINE:2,APPERANCEUR:2,LABSPEC:2,PHURINE:2,GLUCOSEU:2,HGBUR:2,BILIRUBINUR:2,KETONESUR:2,PROTEINUR:2,UROBILINOGEN:2,NITRITE:2,LEUKOCYTESUR:2 in the last 72 hours Misc. Labs:  ABGS No results found for this basename: PHART,PCO2,PO2ART,TCO2,HCO3 in the last 72 hours CULTURES No results found for this or any previous visit (from the past 240 hour(s)). Studies/Results: Dg Abd 1 View  09/28/2012  *RADIOLOGY REPORT*  Clinical Data: Monitor ileus  ABDOMEN - 1 VIEW  Comparison: Abdominal radiograph 09/27/2012 and CT abdomen pelvis 09/26/2012  Findings:  No significant change in diffuse gaseous distention of the  colon, and likely of small bowel loops as well, compared to  the abdominal radiograph of 09/27/2012.  Some oral contrast is seen within the inferior bowel loops.  Evaluation for  free intraperitoneal air is markedly limited on the supine view of the abdomen.  Left abdominal focal calcification is unchanged, likely reflects the known left renal stone.  IMPRESSION:  1.  No significant change in ileus pattern. 2.  Left renal stone.   Original Report Authenticated By: Britta Mccreedy, M.D.     Medications:  Prior to Admission:  Prescriptions prior to admission  Medication Sig Dispense Refill  . acetaminophen (TYLENOL) 500 MG tablet Take 1,000 mg by mouth every 6 (six) hours as needed. For pain       . albuterol (PROVENTIL HFA;VENTOLIN HFA) 108 (90 BASE) MCG/ACT inhaler Inhale 2 puffs into the lungs every 6 (six) hours as needed. For shortness of breath       . albuterol (PROVENTIL) (2.5 MG/3ML) 0.083% nebulizer solution Take 3 mLs (2.5 mg total) by nebulization every 6 (six) hours as needed for wheezing.  75 mL  12  . aspirin 81 MG chewable tablet Chew 81 mg by mouth daily.      . cyclobenzaprine (FLEXERIL) 10 MG tablet Take 10 mg by mouth 2 (two) times daily.        Marland Kitchen dextromethorphan-guaiFENesin (MUCINEX DM) 30-600 MG per 12 hr tablet Take 1 tablet by mouth every 12 (twelve) hours as needed. For congestion      . finasteride (PROSCAR) 5 MG tablet Take 5 mg by mouth daily.      . Fluticasone-Salmeterol (ADVAIR) 250-50 MCG/DOSE AEPB Inhale 1 puff into the lungs every 12 (twelve) hours.        . gabapentin (NEURONTIN) 300 MG capsule Take 300 mg by mouth at bedtime.      Marland Kitchen HYDROcodone-acetaminophen (NORCO) 5-325 MG per tablet Take 1 tablet by mouth every 6 (six) hours as needed. For shingles pain      . lisinopril-hydrochlorothiazide (PRINZIDE,ZESTORETIC) 20-12.5 MG per tablet Take 0.5 tablets by mouth daily.        . Milnacipran (SAVELLA) 50 MG TABS Take 50 mg by mouth 2 (two) times daily.        . roflumilast (DALIRESP) 500 MCG TABS tablet Take 1 tablet (500 mcg total) by mouth daily.  30 tablet  12  . tadalafil (CIALIS) 5 MG tablet Take 5 mg by mouth daily.       Marland Kitchen tiotropium  (SPIRIVA) 18 MCG inhalation capsule Place 18 mcg into inhaler and inhale daily.         Scheduled:   . albuterol  2.5 mg Nebulization Q4H  . antiseptic oral rinse  15 mL Mouth Rinse QID  . aspirin  81 mg Oral Daily  . enoxaparin (LOVENOX) injection  40 mg Subcutaneous Q24H  . feeding supplement  237 mL Oral BID BM  . gabapentin  200 mg Oral QHS  . guaiFENesin  1,200 mg Oral BID  . ipratropium  0.5 mg Nebulization Q4H  . methylPREDNISolone (SOLU-MEDROL) injection  40 mg Intravenous Q12H  . mometasone-formoterol  2 puff Inhalation BID  . pantoprazole  40 mg Oral QAC breakfast   Continuous:   . sodium chloride 50 mL/hr at 09/30/12 0600  . sodium chloride 1,000 mL (09/29/12 0532)  . albuterol 10 mg/hr (09/18/12 1049)   BMW:[id number], acetaminophen, albuterol, alum & mag hydroxide-simeth, bisacodyl, fentaNYL, HYDROcodone-acetaminophen, ondansetron (ZOFRAN) IV, ondansetron, sodium chloride, sodium phosphate  Assesment: He is much improved. He had acute on chronic respiratory failure from acute exacerbation  of COPD. He required ventilator support briefly. He has had paralytic ileus it seems to have resolved. He has scrotal edema that is improving. He has hypertension which is pretty well controlled. He has fibromyalgia unchanged Principal Problem:  *Acute-on-chronic respiratory failure Active Problems:  Acute exacerbation of chronic obstructive pulmonary disease (COPD)  Hypertension  Fibromyalgia  Myoclonus  Paralytic ileus  Scrotal edema    Plan: Continue current treatments move him out of the step down unit he'll have a dose of Lasix    LOS: 12 days   Connor Armstrong 09/30/2012, 7:53 AM

## 2012-09-30 NOTE — Progress Notes (Signed)
Report called to [facility]. Patient is ready to for transfer to room 302.  No acute distress noted.

## 2012-09-30 NOTE — Progress Notes (Signed)
Subjective: States had a large BM today, 2 yesterday. Decreased abdominal distension per pt, feels much improved. Tolerating diet.   Objective: Vital signs in last 24 hours: Temp:  [97.6 F (36.4 C)-98.3 F (36.8 C)] 97.6 F (36.4 C) (01/21 0400) Pulse Rate:  [65-109] 74  (01/21 0600) Resp:  [13-22] 18  (01/21 0600) BP: (111-145)/(68-101) 137/71 mmHg (01/21 0600) SpO2:  [82 %-100 %] 92 % (01/21 0713) Weight:  [190 lb 14.7 oz (86.6 kg)] 190 lb 14.7 oz (86.6 kg) (01/21 0500) General:   Alert and oriented, pleasant Head:  Normocephalic and atraumatic. Eyes:  No icterus, sclera clear. Conjuctiva pink.  Heart:  S1, S2 present, no murmurs noted.  Lungs: scattered rhonchi Abdomen:  Bowel sounds present, slight distension but soft, mild TTP RLQ Msk:  Symmetrical without gross deformities. Normal posture. Extremities:  3+ edema bilateral lower extremities to knee Neurologic:  Alert and  oriented x4;  grossly normal neurologically. Skin:  Warm and dry, intact without significant lesions.  Psych:  Alert and cooperative. Normal mood and affect.  Intake/Output from previous day: 01/20 0701 - 01/21 0700 In: 5919 [P.O.:4780; I.V.:1135; IV Piggyback:4] Out: 4500 [Urine:4500] Intake/Output this shift:    BMET  Basename 09/29/12 0445 09/28/12 0857  NA 137 135  K 3.6 4.3  CL 94* 95*  CO2 39* 34*  GLUCOSE 111* 112*  BUN 36* 41*  CREATININE 1.12 1.04  CALCIUM 8.4 8.4    Studies/Results: Dg Abd 1 View  09/28/2012  *RADIOLOGY REPORT*  Clinical Data: Monitor ileus  ABDOMEN - 1 VIEW  Comparison: Abdominal radiograph 09/27/2012 and CT abdomen pelvis 09/26/2012  Findings:  No significant change in diffuse gaseous distention of the  colon, and likely of small bowel loops as well, compared to the abdominal radiograph of 09/27/2012.  Some oral contrast is seen within the inferior bowel loops.  Evaluation for free intraperitoneal air is markedly limited on the supine view of the abdomen.  Left  abdominal focal calcification is unchanged, likely reflects the known left renal stone.  IMPRESSION:  1.  No significant change in ileus pattern. 2.  Left renal stone.   Original Report Authenticated By: Britta Mccreedy, M.D.     Assessment: 62 year old male with colonic ileus secondary to medications/acute illness, clinically improving. Notes 2 BMs yesterday and 1 this morning. Tolerating diet.   Plan: Continue PPI  Continue heart healthy diet as tolerated Supportive measures   LOS: 12 days   Gerrit Halls  09/30/2012, 7:59 AM

## 2012-09-30 NOTE — Progress Notes (Signed)
REVIEWED.  

## 2012-10-01 ENCOUNTER — Telehealth: Payer: Self-pay | Admitting: Urgent Care

## 2012-10-01 LAB — BASIC METABOLIC PANEL
CO2: >45 mEq/L (ref 19–32)
Calcium: 8.8 mg/dL (ref 8.4–10.5)
Creatinine, Ser: 1.06 mg/dL (ref 0.50–1.35)
Glucose, Bld: 116 mg/dL — ABNORMAL HIGH (ref 70–99)

## 2012-10-01 LAB — GLUCOSE, CAPILLARY: Glucose-Capillary: 87 mg/dL (ref 70–99)

## 2012-10-01 NOTE — Progress Notes (Signed)
CRITICAL VALUE ALERT  Critical value received:  CO2 49  Date of notification:  10/01/2012   Time of notification:  0642  Critical value read back:yes  Nurse who received alert:  CW  MD notified (1st page):  Dr. Sudie Bailey   Time of first page:  0645  MD notified (2nd page):  Time of second page:  Responding MD:    Time MD responded:

## 2012-10-01 NOTE — Discharge Summary (Signed)
Physician Discharge Summary  Patient ID: Connor Armstrong MRN: [id number] DOB/AGE: [birth year] 62 y.o. Primary Care Physician:Lillah Standre L, MD Admit date: 09/18/2012 Discharge date: 10/01/2012    Discharge Diagnoses:   Principal Problem:  *Acute-on-chronic respiratory failure Active Problems:  Acute exacerbation of chronic obstructive pulmonary disease (COPD)  Hypertension  Fibromyalgia  Myoclonus  Paralytic ileus  Scrotal edema     Medication List     As of 10/01/2012  6:47 PM    TAKE these medications         acetaminophen 500 MG tablet   Commonly known as: TYLENOL   Take 1,000 mg by mouth every 6 (six) hours as needed. For pain      albuterol 108 (90 BASE) MCG/ACT inhaler   Commonly known as: PROVENTIL HFA;VENTOLIN HFA   Inhale 2 puffs into the lungs every 6 (six) hours as needed. For shortness of breath      albuterol (2.5 MG/3ML) 0.083% nebulizer solution   Commonly known as: PROVENTIL   Take 3 mLs (2.5 mg total) by nebulization every 6 (six) hours as needed for wheezing.      aspirin 81 MG chewable tablet   Chew 81 mg by mouth daily.      cyclobenzaprine 10 MG tablet   Commonly known as: FLEXERIL   Take 10 mg by mouth 2 (two) times daily.      dextromethorphan-guaiFENesin 30-600 MG per 12 hr tablet   Commonly known as: MUCINEX DM   Take 1 tablet by mouth every 12 (twelve) hours as needed. For congestion      finasteride 5 MG tablet   Commonly known as: PROSCAR   Take 5 mg by mouth daily.      Fluticasone-Salmeterol 250-50 MCG/DOSE Aepb   Commonly known as: ADVAIR   Inhale 1 puff into the lungs every 12 (twelve) hours.      gabapentin 300 MG capsule   Commonly known as: NEURONTIN   Take 300 mg by mouth at bedtime.      HYDROcodone-acetaminophen 5-325 MG per tablet   Commonly known as: NORCO/VICODIN   Take 1 tablet by mouth every 6 (six) hours as needed. For shingles pain      lisinopril-hydrochlorothiazide 20-12.5 MG per tablet   Commonly  known as: PRINZIDE,ZESTORETIC   Take 0.5 tablets by mouth daily.      Milnacipran 50 MG Tabs   Commonly known as: SAVELLA   Take 50 mg by mouth 2 (two) times daily.      roflumilast 500 MCG Tabs tablet   Commonly known as: DALIRESP   Take 1 tablet (500 mcg total) by mouth daily.      tadalafil 5 MG tablet   Commonly known as: CIALIS   Take 5 mg by mouth daily.      tiotropium 18 MCG inhalation capsule   Commonly known as: SPIRIVA   Place 18 mcg into inhaler and inhale daily.        Discharged Condition: Improved    Consults: Neurology/gastroenterology  Significant Diagnostic Studies: Dg Chest 2 View  09/18/2012  *RADIOLOGY REPORT*  Clinical Data: Shortness of breath, cough, COPD  CHEST - 2 VIEW  Comparison: 06/13/2012  Findings: Chronic interstitial markings/hyperinflation.  No focal consolidation. No pleural effusion or pneumothorax.  The heart is normal in size.  Stable right hilar prominence, likely vascular.  Visualized osseous structures are within normal limits.  Surgical clips in the right neck.  IMPRESSION: No evidence of acute cardiopulmonary disease.   Original Report Authenticated  By: Charline Bills, M.D.    Dg Abd 1 View  09/28/2012  *RADIOLOGY REPORT*  Clinical Data: Monitor ileus  ABDOMEN - 1 VIEW  Comparison: Abdominal radiograph 09/27/2012 and CT abdomen pelvis 09/26/2012  Findings:  No significant change in diffuse gaseous distention of the  colon, and likely of small bowel loops as well, compared to the abdominal radiograph of 09/27/2012.  Some oral contrast is seen within the inferior bowel loops.  Evaluation for free intraperitoneal air is markedly limited on the supine view of the abdomen.  Left abdominal focal calcification is unchanged, likely reflects the known left renal stone.  IMPRESSION:  1.  No significant change in ileus pattern. 2.  Left renal stone.   Original Report Authenticated By: Britta Mccreedy, M.D.    Dg Abd 1 View  09/27/2012  *RADIOLOGY  REPORT*  Clinical Data: Pre-procedure colonoscopy, ileus  ABDOMEN - 1 VIEW  Comparison: Prior chest x-ray 09/26/2012  Findings: Single abdominal radiograph demonstrates relatively unchanged diffuse gaseous distension of both large and small bowel throughout the abdomen.  Gas is noted within the rectum. Incidentally, there is bilateral hydronephrosis identified by residual contrast material within the renal collecting systems. Radiopacity in the left renal collecting systems suggest nephrolithiasis.  No acute osseous abnormality.  IMPRESSION:  1.  No significant interval change in the degree of diffuse gaseous distension of large and small bowel throughout the abdomen most consistent with adynamic ileus.  2.  Bilateral hydronephrosis and left and nephrolithiasis.   Original Report Authenticated By: Malachy Moan, M.D.    Dg Abd 1 View  09/26/2012  *RADIOLOGY REPORT*  Clinical Data: Ileus.  ABDOMEN - 1 VIEW  Comparison: CT of earlier today  Findings: Single supine view of the abdomen and pelvis.  High abdomen and low pelvis excluded.  Similar diffuse large and small bowel dilatation.  Retained contrast within an obstructed left renal collecting system, moderate.  No pneumatosis.  Limited sensitivity for free intraperitoneal air. Contrast within the pelvis is favored to be in the sigmoid colon.  IMPRESSION: Similar large and small bowel dilatation, likely ileus.  Left-sided hydronephrosis with retained contrast in the dilated collecting system.   Original Report Authenticated By: Jeronimo Greaves, M.D.    Ct Abdomen Pelvis W Contrast  09/26/2012  *RADIOLOGY REPORT*  Clinical Data: Abdominal pain and distention.  CT ABDOMEN AND PELVIS WITH CONTRAST  Technique:  Multidetector CT imaging of the abdomen and pelvis was performed following the standard protocol during bolus administration of intravenous contrast.  Contrast:  OMNIPAQUE IOHEXOL 300 MG/ML  SOLN  Comparison: Noncontrast CT on 08/28/2010  Findings:  Diffuse colonic dilatation is seen with air-fluid levels, however there is no evidence of an obstructing lesion involving the distal colon.  This is consistent with a severe colonic ileus. There is no evidence of dilated small bowel loops.  Diffuse body wall and mesenteric edema is noted as well as small bilateral pleural effusions and bibasilar atelectasis.  Foley catheter is seen within the bladder.  A small right inguinal hernia is again noted.  No evidence of focal inflammatory process or abscess within the abdomen or pelvis.  Severe right hydronephrosis has increased since previous study. There is diffuse right renal parenchymal atrophy.  No obstructing calculus or mass identified.  A nonobstructing calculus is noted in the left renal pelvis which measures 6 mm.  No ureteral calculi or dilatation identified.  The liver, gallbladder, spleen, pancreas, and adrenal glands are normal in appearance.  No soft tissue masses  or lymphadenopathy identified.  IMPRESSION:  1.  Severe colonic ileus.  No mass or inflammatory process identified. 2.  Diffuse body wall and mesenteric edema, and small bilateral pleural effusions. 3.  Severe right hydronephrosis and right renal parenchymal atrophy.  No obstructing etiology visualized by CT.  A congenital UPJ obstruction cannot be excluded. 4.  6 mm nonobstructing calculus in the left renal pelvis. 5.  Stable small right inguinal hernia.   Original Report Authenticated By: Myles Rosenthal, M.D.    Dg Chest Port 1 View  09/25/2012  *RADIOLOGY REPORT*  Clinical Data: Respiratory failure  PORTABLE CHEST - 1 VIEW  Comparison: Portable chest x-ray of 09/24/2012  Findings: The lungs remain hyperaerated consistent with COPD. There is probable pulmonary vascular congestion present and there may be a small left effusion.  The heart is within upper limits of normal. Prominent hila again are noted which compared to prior films appear to due to prominent pulmonary artery segments due to  pulmonary arterial hypertension.  NG tube is noted into the fundus of the stomach.  The endotracheal tube remains in good position well above the carina.  IMPRESSION: COPD.  Question mild pulmonary vascular ingestion and possible small left effusion.   Original Report Authenticated By: Dwyane Dee, M.D.    Dg Chest Port 1 View  09/24/2012  *RADIOLOGY REPORT*  Clinical Data: Intubation.  Respiratory failure.  PORTABLE CHEST - 1 VIEW  Comparison: 09/18/2012  Findings: Interval placement of an endotracheal tube with tip about 6.1 cm above the carina.  An enteric tube was placed.  The tip is not visualized but is below the left hemidiaphragm consistent with location in the inner distal to the stomach.  There is interval development of infiltration or atelectasis in both lung bases, greater on the left.  Probable emphysematous changes in the upper lungs.  Scattered fibrosis.  Normal heart size and pulmonary vascularity.  No pneumothorax.  IMPRESSION: Appliances appear in satisfactory location.  Infiltration or atelectasis in the lung bases is developing.   Original Report Authenticated By: Burman Nieves, M.D.    Dg Chest Port 1v Same Day  09/24/2012  *RADIOLOGY REPORT*  Clinical Data: Endotracheal tube was advanced.  PORTABLE CHEST - 1 VIEW SAME DAY  Comparison: 09/24/2012 at 0209 hours  Findings: Endotracheal tube is present with tip about 4.8 cm above the carina.  The enteric tube tip is in the left upper quadrant consistent with location in the upper stomach.  Shallow inspiration.  Normal heart size and pulmonary vascularity. Infiltration or atelectasis in the lung bases.  IMPRESSION: Endotracheal tube tip is about 4.8 cm above the carina.  Persistent infiltration or atelectasis in the lung bases.   Original Report Authenticated By: Burman Nieves, M.D.    Dg Abd Portable 1v  09/25/2012  *RADIOLOGY REPORT*  Clinical Data: Ileus, follow-up  PORTABLE ABDOMEN - 1 VIEW  Comparison: Abdomen films of 09/24/2012   Findings: There is persistent gaseous distention primarily of colon with air seen to the rectum.  This is most consistent with colonic ileus with no definite evidence of obstruction by plain film.  Only the lower abdomen and pelvis is visualized, and the NG tube position cannot be determined.  IMPRESSION: Little change in probable colonic ileus.   Original Report Authenticated By: Dwyane Dee, M.D.    Dg Abd Portable 1v  09/24/2012  **ADDENDUM** CREATED: 09/24/2012 14:29:48  The technologist performed a second AP view of the pelvis, as it was not included in on the first image  of the abdomen.  This of the pelvis demonstrates gaseous distention of the cecum. Gas is seen in the nondistended sigmoid colon and rectum.  No dilated small bowel loops.  The bony pelvis is intact.  Impression:  Gas is seen throughout the colon to the level of the rectum.  Mild gaseous distention of the colon is not unexpected given colonoscopy performed today.  **END ADDENDUM** SIGNED BY: Britta Mccreedy, M.D.   09/24/2012  *RADIOLOGY REPORT*  Clinical Data: Distended abdomen.  PORTABLE ABDOMEN - 1 VIEW  Comparison: CT abdomen pelvis contrast 08/28/2010  Findings: The imaged lung bases are clear.  A nasogastric tube terminates in the fundal region of the stomach.  There are multiple dilated loops of bowel, each of which appear to have haustra, and favored to be colon. No definite dilated small bowel loops.  The lower pelvis is not occluded on this imaging.  Therefore evaluation of the distal sigmoid and rectum is not possible.  There are surgical clips projecting over the right abdomen.  No gross evidence of free intraperitoneal air on this portable supine view of the abdomen.  IMPRESSION: Multiple gas-filled dilated loops of bowel, favored to be colon. The lower pelvis is not included on this image.  The primary considerations include distal colonic obstruction or colonic ileus.  Original Report Authenticated By: Britta Mccreedy, M.D.      Lab Results: Basic Metabolic Panel:  Basename 10/01/12 0344 09/29/12 0445  NA 138 137  K 4.1 3.6  CL 91* 94*  CO2 >45* 39*  GLUCOSE 116* 111*  BUN 23 36*  CREATININE 1.06 1.12  CALCIUM 8.8 8.4  MG -- --  PHOS -- --   Liver Function Tests: No results found for this basename: AST:2,ALT:2,ALKPHOS:2,BILITOT:2,PROT:2,ALBUMIN:2 in the last 72 hours   CBC: No results found for this basename: WBC:2,NEUTROABS:2,HGB:2,HCT:2,MCV:2,PLT:2 in the last 72 hours  No results found for this or any previous visit (from the past 240 hour(s)).   Hospital Course: He was admitted with acute respiratory failure. His PCO2 was in the 90s. He was placed on BiPAP and improved. He was complaining of myoclonic jerks. It was felt that these may be related to some sort of problem in his muscular system or perhaps to his elevated PCO2. Neurology consultation was requested and his medications were altered. He did well initially and then about 3 days into his hospitalization developed another episode of acute respiratory failure with a PCO2 of about 120 despite BiPAP. He was intubated that point. He required about 36 hours of ventilator support. He was given 3 antibiotics. He then developed problems with paralytic ileus. He ended up having to have colonoscopy. His ileus eventually resolved. He had trouble with scrotal edema. He had trouble with urinary retention. He was given Lasix and his scrotal edema improved. His Foley catheter was removed on the day of discharge but he was unable to urinate and seemed to get more penile swelling. Foley catheter was replaced with good results. It was felt that he's going to need BiPAP at home so that is being arranged. His breathing is about as good as it ever is and he is able to ambulate short distances. He is using oxygen  Discharge Exam: Blood pressure 109/66, pulse 78, temperature 97.3 F (36.3 C), temperature source Oral, resp. rate 20, height 5\' 9"  (1.753 m), weight 86.6  kg (190 lb 14.7 oz), SpO2 94.00%. He is awake and alert. His chest is relatively clear. His heart is regular. He has 1+  edema of the extremities but his scrotal edema appeared better. He has a Foley catheter in place  Disposition: Home with Foley catheter and home health services. His catheter can be removed at home. If he develops more swelling of his penis after the catheter is removed again then he will need urology consultation. He will start BiPAP. He will continue with his oxygen.      Discharge Orders    Future Appointments: Provider: Department: Dept Phone: Center:   11/10/2012 1:30 PM Joselyn Arrow, NP [facility] [phone] [facility]     Future Orders Please Complete By Expires   Home Health      Questions: Responses:   To provide the following care/treatments PT    RN    Respiratory Care   Face-to-face encounter      Comments:   I Chaundra Abreu L certify that this patient is under my care and that I, or a nurse practitioner or physician's assistant working with me, had a face-to-face encounter that meets the physician face-to-face encounter requirements with this patient on 10/01/2012. The encounter with the patient was in whole, or in part for the following medical condition(s) which is the primary reason for home health care (List medical condition): Acute respiratory failure/paralytic ileus   Questions: Responses:   The encounter with the patient was in whole, or in part, for the following medical condition, which is the primary reason for home health care Acute respiratory failure/paralytic ileus   I certify that, based on my findings, the following services are medically necessary home health services Nursing    Physical therapy   My clinical findings support the need for the above services Shortness of breath with activity   Further, I certify that my clinical findings support that this patient is homebound due to: Shortness of Breath with activity    To provide the following care/treatments PT    RN    Respiratory Care   Discharge patient         Follow-up Information    Follow up with [facility].   Contact information:   [address] [place] [address] [phone]         Signed: Fredirick Maudlin Pager [phone]  10/01/2012, 6:47 PM

## 2012-10-01 NOTE — Progress Notes (Addendum)
Patient unable to void after foley d/ced and penis swollen.  MD informed and ordered foley reinserted. #16 french foley inserted with return of 800cc clear yellow urine.

## 2012-10-01 NOTE — Telephone Encounter (Signed)
Pt to be D/C'd soon Needs 90mo FU OV re: colonic ileus Thanks

## 2012-10-01 NOTE — Telephone Encounter (Signed)
Pt is aware of OV on 3/3 at 130 with KJ and appt card was mailed

## 2012-10-01 NOTE — Progress Notes (Signed)
Subjective: He says he feels much better. His penis was markedly swollen last night and his scrotum was still inflamed. He had to have a Foley catheter replaced. This morning he looks better and wants to go home  Objective: Vital signs in last 24 hours: Temp:  [97.3 F (36.3 C)-97.9 F (36.6 C)] 97.3 F (36.3 C) (01/22 0510) Pulse Rate:  [78-103] 78  (01/22 0510) Resp:  [20-23] 20  (01/22 0510) BP: (109-140)/(66-82) 109/66 mmHg (01/22 0510) SpO2:  [91 %-97 %] 97 % (01/22 0644) FiO2 (%):  [35 %] 35 % (01/22 0403) Weight:  [86.6 kg (190 lb 14.7 oz)] 86.6 kg (190 lb 14.7 oz) (01/22 0555) Weight change: 0 kg (0 lb) Last BM Date: 09/30/12  Intake/Output from previous day: 01/21 0701 - 01/22 0700 In: 4250 [P.O.:1800; I.V.:150] Out: 4425 [Urine:4425]  PHYSICAL EXAM General appearance: alert, cooperative and no distress Resp: clear to auscultation bilaterally Cardio: regular rate and rhythm, S1, S2 normal, no murmur, click, rub or gallop GI: soft, non-tender; bowel sounds normal; no masses,  no organomegaly Extremities: extremities normal, atraumatic, no cyanosis or edema  Lab Results:    Basic Metabolic Panel:  Basename 10/01/12 0344 09/29/12 0445  NA 138 137  K 4.1 3.6  CL 91* 94*  CO2 >45* 39*  GLUCOSE 116* 111*  BUN 23 36*  CREATININE 1.06 1.12  CALCIUM 8.8 8.4  MG -- --  PHOS -- --   Liver Function Tests: No results found for this basename: AST:2,ALT:2,ALKPHOS:2,BILITOT:2,PROT:2,ALBUMIN:2 in the last 72 hours No results found for this basename: LIPASE:2,AMYLASE:2 in the last 72 hours No results found for this basename: AMMONIA:2 in the last 72 hours CBC: No results found for this basename: WBC:2,NEUTROABS:2,HGB:2,HCT:2,MCV:2,PLT:2 in the last 72 hours Cardiac Enzymes: No results found for this basename: CKTOTAL:3,CKMB:3,CKMBINDEX:3,TROPONINI:3 in the last 72 hours BNP: No results found for this basename: PROBNP:3 in the last 72 hours D-Dimer: No results found  for this basename: DDIMER:2 in the last 72 hours CBG:  Basename 10/01/12 0727 09/30/12 2045 09/30/12 1635 09/30/12 1132 09/30/12 0733 09/29/12 2143  GLUCAP 92 150* 124* 80 80 131*   Hemoglobin A1C: No results found for this basename: HGBA1C in the last 72 hours Fasting Lipid Panel: No results found for this basename: CHOL,HDL,LDLCALC,TRIG,CHOLHDL,LDLDIRECT in the last 72 hours Thyroid Function Tests: No results found for this basename: TSH,T4TOTAL,FREET4,T3FREE,THYROIDAB in the last 72 hours Anemia Panel: No results found for this basename: VITAMINB12,FOLATE,FERRITIN,TIBC,IRON,RETICCTPCT in the last 72 hours Coagulation: No results found for this basename: LABPROT:2,INR:2 in the last 72 hours Urine Drug Screen: Drugs of Abuse  No results found for this basename: labopia, cocainscrnur, labbenz, amphetmu, thcu, labbarb    Alcohol Level: No results found for this basename: ETH:2 in the last 72 hours Urinalysis: No results found for this basename: COLORURINE:2,APPERANCEUR:2,LABSPEC:2,PHURINE:2,GLUCOSEU:2,HGBUR:2,BILIRUBINUR:2,KETONESUR:2,PROTEINUR:2,UROBILINOGEN:2,NITRITE:2,LEUKOCYTESUR:2 in the last 72 hours Misc. Labs:  ABGS No results found for this basename: PHART,PCO2,PO2ART,TCO2,HCO3 in the last 72 hours CULTURES No results found for this or any previous visit (from the past 240 hour(s)). Studies/Results: No results found.  Medications:  Prior to Admission:  Prescriptions prior to admission  Medication Sig Dispense Refill  . acetaminophen (TYLENOL) 500 MG tablet Take 1,000 mg by mouth every 6 (six) hours as needed. For pain       . albuterol (PROVENTIL HFA;VENTOLIN HFA) 108 (90 BASE) MCG/ACT inhaler Inhale 2 puffs into the lungs every 6 (six) hours as needed. For shortness of breath       . albuterol (PROVENTIL) (2.5 MG/3ML)  0.083% nebulizer solution Take 3 mLs (2.5 mg total) by nebulization every 6 (six) hours as needed for wheezing.  75 mL  12  . aspirin 81 MG chewable  tablet Chew 81 mg by mouth daily.      . cyclobenzaprine (FLEXERIL) 10 MG tablet Take 10 mg by mouth 2 (two) times daily.        Marland Kitchen dextromethorphan-guaiFENesin (MUCINEX DM) 30-600 MG per 12 hr tablet Take 1 tablet by mouth every 12 (twelve) hours as needed. For congestion      . finasteride (PROSCAR) 5 MG tablet Take 5 mg by mouth daily.      . Fluticasone-Salmeterol (ADVAIR) 250-50 MCG/DOSE AEPB Inhale 1 puff into the lungs every 12 (twelve) hours.        . gabapentin (NEURONTIN) 300 MG capsule Take 300 mg by mouth at bedtime.      Marland Kitchen HYDROcodone-acetaminophen (NORCO) 5-325 MG per tablet Take 1 tablet by mouth every 6 (six) hours as needed. For shingles pain      . lisinopril-hydrochlorothiazide (PRINZIDE,ZESTORETIC) 20-12.5 MG per tablet Take 0.5 tablets by mouth daily.        . Milnacipran (SAVELLA) 50 MG TABS Take 50 mg by mouth 2 (two) times daily.        . roflumilast (DALIRESP) 500 MCG TABS tablet Take 1 tablet (500 mcg total) by mouth daily.  30 tablet  12  . tadalafil (CIALIS) 5 MG tablet Take 5 mg by mouth daily.       Marland Kitchen tiotropium (SPIRIVA) 18 MCG inhalation capsule Place 18 mcg into inhaler and inhale daily.         Scheduled:   . albuterol  2.5 mg Nebulization Q4H  . antiseptic oral rinse  15 mL Mouth Rinse QID  . aspirin  81 mg Oral Daily  . enoxaparin (LOVENOX) injection  40 mg Subcutaneous Q24H  . feeding supplement  237 mL Oral BID BM  . furosemide  40 mg Intravenous BID  . gabapentin  200 mg Oral QHS  . guaiFENesin  1,200 mg Oral BID  . ipratropium  0.5 mg Nebulization Q4H  . methylPREDNISolone (SOLU-MEDROL) injection  40 mg Intravenous Q12H  . mometasone-formoterol  2 puff Inhalation BID  . pantoprazole  40 mg Oral QAC breakfast   Continuous:   . sodium chloride 50 mL/hr at 09/30/12 1000  . albuterol 10 mg/hr (09/18/12 1049)   ZOX:[id number], acetaminophen, albuterol, alum & mag hydroxide-simeth, bisacodyl, fentaNYL, HYDROcodone-acetaminophen, ondansetron  (ZOFRAN) IV, ondansetron, sodium chloride, sodium phosphate  Assesment: His penile and scrotal edema are much better at about back to normal. He is breathing very well. His ileus has resolved. I think she's ready for discharge Principal Problem:  *Acute-on-chronic respiratory failure Active Problems:  Acute exacerbation of chronic obstructive pulmonary disease (COPD)  Hypertension  Fibromyalgia  Myoclonus  Paralytic ileus  Scrotal edema    Plan: Discharge home today    LOS: 13 days   Kaeleigh Westendorf L 10/01/2012, 8:48 AM

## 2012-10-25 ENCOUNTER — Other Ambulatory Visit: Payer: Self-pay

## 2012-11-01 ENCOUNTER — Emergency Department (HOSPITAL_COMMUNITY): Payer: Medicare Other

## 2012-11-01 ENCOUNTER — Inpatient Hospital Stay (HOSPITAL_COMMUNITY)
Admission: EM | Admit: 2012-11-01 | Discharge: 2012-11-06 | DRG: 193 | Disposition: A | Discharge status: Home Health | Payer: Medicare Other | Attending: Pulmonary Disease | Admitting: Pulmonary Disease

## 2012-11-01 ENCOUNTER — Inpatient Hospital Stay (HOSPITAL_COMMUNITY): Payer: Medicare Other

## 2012-11-01 ENCOUNTER — Encounter (HOSPITAL_COMMUNITY): Payer: Self-pay

## 2012-11-01 DIAGNOSIS — Z85528 Personal history of other malignant neoplasm of kidney: Secondary | ICD-10-CM

## 2012-11-01 DIAGNOSIS — IMO0001 Reserved for inherently not codable concepts without codable children: Secondary | ICD-10-CM | POA: Diagnosis present

## 2012-11-01 DIAGNOSIS — J9620 Acute and chronic respiratory failure, unspecified whether with hypoxia or hypercapnia: Secondary | ICD-10-CM | POA: Diagnosis present

## 2012-11-01 DIAGNOSIS — I10 Essential (primary) hypertension: Secondary | ICD-10-CM | POA: Diagnosis present

## 2012-11-01 DIAGNOSIS — J189 Pneumonia, unspecified organism: Principal | ICD-10-CM | POA: Diagnosis present

## 2012-11-01 DIAGNOSIS — Z9981 Dependence on supplemental oxygen: Secondary | ICD-10-CM

## 2012-11-01 DIAGNOSIS — J441 Chronic obstructive pulmonary disease with (acute) exacerbation: Secondary | ICD-10-CM | POA: Diagnosis present

## 2012-11-01 DIAGNOSIS — F17200 Nicotine dependence, unspecified, uncomplicated: Secondary | ICD-10-CM | POA: Diagnosis present

## 2012-11-01 DIAGNOSIS — Z79899 Other long term (current) drug therapy: Secondary | ICD-10-CM

## 2012-11-01 DIAGNOSIS — Z7982 Long term (current) use of aspirin: Secondary | ICD-10-CM

## 2012-11-01 LAB — CBC WITH DIFFERENTIAL/PLATELET
Basophils Absolute: 0 10*3/uL (ref 0.0–0.1)
Lymphocytes Relative: 7 % — ABNORMAL LOW (ref 12–46)
Neutro Abs: 7.7 10*3/uL (ref 1.7–7.7)
Platelets: 390 10*3/uL (ref 150–400)
RDW: 14.9 % (ref 11.5–15.5)
WBC: 10.2 10*3/uL (ref 4.0–10.5)

## 2012-11-01 LAB — BLOOD GAS, ARTERIAL
Patient temperature: 37
TCO2: 33.3 mmol/L (ref 0–100)
pH, Arterial: 7.346 — ABNORMAL LOW (ref 7.350–7.450)

## 2012-11-01 LAB — MRSA PCR SCREENING: MRSA by PCR: NEGATIVE

## 2012-11-01 LAB — BASIC METABOLIC PANEL
CO2: 36 mEq/L — ABNORMAL HIGH (ref 19–32)
Calcium: 9.4 mg/dL (ref 8.4–10.5)
Chloride: 94 mEq/L — ABNORMAL LOW (ref 96–112)
Sodium: 136 mEq/L (ref 135–145)

## 2012-11-01 LAB — TROPONIN I: Troponin I: <0.3 ng/mL (ref <0.30)

## 2012-11-01 LAB — PRO B NATRIURETIC PEPTIDE: Pro B Natriuretic peptide (BNP): 62.2 pg/mL (ref 0–125)

## 2012-11-01 MED ORDER — SODIUM CHLORIDE 0.9 % IV SOLN
INTRAVENOUS | Status: DC
  Administered 2012-11-01 – 2012-11-05 (×4): via INTRAVENOUS

## 2012-11-01 MED ORDER — LEVOFLOXACIN IN D5W 500 MG/100ML IV SOLN
500.0000 mg | Freq: Once | INTRAVENOUS | Status: AC
  Administered 2012-11-01: 500 mg via INTRAVENOUS
  Filled 2012-11-01: qty 100

## 2012-11-01 MED ORDER — ENOXAPARIN SODIUM 40 MG/0.4ML ~~LOC~~ SOLN
40.0000 mg | SUBCUTANEOUS | Status: DC
  Administered 2012-11-01 – 2012-11-05 (×5): 40 mg via SUBCUTANEOUS
  Filled 2012-11-01 (×5): qty 0.4

## 2012-11-01 MED ORDER — GABAPENTIN 300 MG PO CAPS
300.0000 mg | ORAL_CAPSULE | Freq: Every day | ORAL | Status: DC
  Administered 2012-11-01 – 2012-11-05 (×5): 300 mg via ORAL
  Filled 2012-11-01 (×5): qty 1

## 2012-11-01 MED ORDER — HYDROCODONE-ACETAMINOPHEN 5-325 MG PO TABS: 1.0000 | ORAL_TABLET | Freq: Four times a day (QID) | ORAL | Status: DC | PRN

## 2012-11-01 MED ORDER — METHYLPREDNISOLONE SODIUM SUCC 125 MG IJ SOLR
125.0000 mg | Freq: Four times a day (QID) | INTRAMUSCULAR | Status: DC
  Administered 2012-11-01 – 2012-11-06 (×20): 125 mg via INTRAVENOUS
  Filled 2012-11-01 (×20): qty 2

## 2012-11-01 MED ORDER — ASPIRIN 81 MG PO CHEW
81.0000 mg | CHEWABLE_TABLET | Freq: Every day | ORAL | Status: DC
  Administered 2012-11-02 – 2012-11-06 (×5): 81 mg via ORAL
  Filled 2012-11-01 (×6): qty 1

## 2012-11-01 MED ORDER — ROFLUMILAST 500 MCG PO TABS
500.0000 ug | ORAL_TABLET | Freq: Every day | ORAL | Status: DC
  Administered 2012-11-02 – 2012-11-06 (×5): 500 ug via ORAL
  Filled 2012-11-01 (×6): qty 1

## 2012-11-01 MED ORDER — ALBUTEROL SULFATE (5 MG/ML) 0.5% IN NEBU: 2.5000 mg | INHALATION_SOLUTION | RESPIRATORY_TRACT | Status: AC | PRN

## 2012-11-01 MED ORDER — CYCLOBENZAPRINE HCL 10 MG PO TABS
10.0000 mg | ORAL_TABLET | Freq: Two times a day (BID) | ORAL | Status: DC
  Administered 2012-11-01 – 2012-11-06 (×10): 10 mg via ORAL
  Filled 2012-11-01 (×10): qty 1

## 2012-11-01 MED ORDER — ALBUTEROL SULFATE (5 MG/ML) 0.5% IN NEBU
2.5000 mg | INHALATION_SOLUTION | Freq: Three times a day (TID) | RESPIRATORY_TRACT | Status: DC
  Administered 2012-11-01 – 2012-11-06 (×15): 2.5 mg via RESPIRATORY_TRACT
  Filled 2012-11-01 (×13): qty 0.5

## 2012-11-01 MED ORDER — TIOTROPIUM BROMIDE MONOHYDRATE 18 MCG IN CAPS
18.0000 ug | ORAL_CAPSULE | Freq: Every day | RESPIRATORY_TRACT | Status: DC
  Administered 2012-11-02 – 2012-11-06 (×5): 18 ug via RESPIRATORY_TRACT
  Filled 2012-11-01: qty 5

## 2012-11-01 MED ORDER — MILNACIPRAN HCL 50 MG PO TABS
50.0000 mg | ORAL_TABLET | Freq: Two times a day (BID) | ORAL | Status: DC
  Administered 2012-11-01 – 2012-11-06 (×10): 50 mg via ORAL
  Filled 2012-11-01 (×13): qty 1

## 2012-11-01 MED ORDER — MAGNESIUM SULFATE 40 MG/ML IJ SOLN
2.0000 g | Freq: Once | INTRAMUSCULAR | Status: AC
  Administered 2012-11-01: 2 g via INTRAVENOUS
  Filled 2012-11-01: qty 50

## 2012-11-01 MED ORDER — SODIUM CHLORIDE 0.9 % IV SOLN
INTRAVENOUS | Status: AC
  Administered 2012-11-01: 17:00:00 via INTRAVENOUS

## 2012-11-01 MED ORDER — DM-GUAIFENESIN ER 30-600 MG PO TB12: 1.0000 | ORAL_TABLET | Freq: Two times a day (BID) | ORAL | Status: DC | PRN

## 2012-11-01 MED ORDER — IOHEXOL 350 MG/ML SOLN
100.0000 mL | Freq: Once | INTRAVENOUS | Status: AC | PRN
  Administered 2012-11-01: 100 mL via INTRAVENOUS

## 2012-11-01 MED ORDER — FINASTERIDE 5 MG PO TABS
5.0000 mg | ORAL_TABLET | Freq: Every day | ORAL | Status: DC
  Administered 2012-11-02 – 2012-11-06 (×5): 5 mg via ORAL
  Filled 2012-11-01 (×6): qty 1

## 2012-11-01 MED ORDER — IPRATROPIUM BROMIDE 0.02 % IN SOLN
0.5000 mg | Freq: Once | RESPIRATORY_TRACT | Status: AC
  Administered 2012-11-01: 0.5 mg via RESPIRATORY_TRACT
  Filled 2012-11-01: qty 2.5

## 2012-11-01 MED ORDER — ALBUTEROL SULFATE (5 MG/ML) 0.5% IN NEBU
2.5000 mg | INHALATION_SOLUTION | Freq: Once | RESPIRATORY_TRACT | Status: AC
  Administered 2012-11-01: 2.5 mg via RESPIRATORY_TRACT
  Filled 2012-11-01: qty 0.5

## 2012-11-01 MED ORDER — METHYLPREDNISOLONE SODIUM SUCC 125 MG IJ SOLR
125.0000 mg | Freq: Once | INTRAMUSCULAR | Status: AC
  Administered 2012-11-01: 125 mg via INTRAVENOUS
  Filled 2012-11-01: qty 2

## 2012-11-01 MED ORDER — ACETAMINOPHEN 325 MG PO TABS: 650.0000 mg | ORAL_TABLET | Freq: Four times a day (QID) | ORAL | Status: DC | PRN

## 2012-11-01 MED ORDER — ONDANSETRON HCL 4 MG/2ML IJ SOLN: 4.0000 mg | Freq: Three times a day (TID) | INTRAMUSCULAR | Status: AC | PRN

## 2012-11-01 NOTE — ED Notes (Signed)
CRITICAL VALUE ALERT  Critical value received:  PC02 66.6  Date of notification:  11/01/2012  Time of notification:  1539  Critical value read back:yes  Nurse who received alert:  L. Elmer Picker RN  MD notified (1st page):  Dr. Manus Gunning  Time of first page:  1539  MD notified (2nd page):  Time of second page:  Responding MD:  Dr. Manus Gunning  Time MD responded:  [phone]

## 2012-11-01 NOTE — ED Notes (Signed)
Pt breathing easier, awaiting bed assignment. Wife at bedside. 02 remains at 3l by nasal cannula. Stated he did not feel like he needed bi-pap at present and will let staff know if he did.

## 2012-11-01 NOTE — ED Notes (Signed)
Pt and wife report that he has been sick for 2 days, with increased sob, today unable to keep sats above 85% on home 02, had to put pt on cpap which he only uses only at night, to keep sats up. 90 at arrival. Placed on 3l and sats are 94%. md to bedside for exam.  Exp. Wheezes noted.

## 2012-11-01 NOTE — ED Provider Notes (Signed)
History     CSN: [id number]  Arrival date & time 11/01/12  1421   First MD Initiated Contact with Patient 11/01/12 1434      Chief Complaint  Patient presents with  . Shortness of Breath    (Consider location/radiation/quality/duration/timing/severity/associated sxs/prior treatment) HPI Comments: Patient presents with shortness of breath, cough for the past 2 days. He has a history of COPD on 2 L of oxygen all times. His wife states today she had difficulty keeping his oxygen saturation about 85. He normally runs and 96% on 3 L but today she was only 85% and she had put him on CPAP. Referred to the ED by Dr. Sudie Bailey. Has had low-grade fevers at home which he attributes to fibromyalgia. Endorses nonproductive cough. No chest pain, leg pain or swelling, abdominal pain nausea or vomiting. He was admitted one month ago and required intubation for hypercapnia.  The history is provided by the patient and the spouse.    Past Medical History  Diagnosis Date  . COPD (chronic obstructive pulmonary disease)   . Hypertension   . Fibromyalgia   . Shingles   . Cancer     kidney    Past Surgical History  Procedure Laterality Date  . Hernia repair    . Thyroid surgery    . Parathyroid surgery    . Kidney surgery    . Flexible sigmoidoscopy  09/27/2012    Procedure: FLEXIBLE SIGMOIDOSCOPY;  Surgeon: West Bali, MD;  Location: [facility];  Service: Endoscopy;  Laterality: N/A;    Family History  Problem Relation Age of Onset  . Hypertension Mother     History  Substance Use Topics  . Smoking status: Current Every Day Smoker -- 1.00 packs/day  . Smokeless tobacco: Not on file  . Alcohol Use: No      Review of Systems  Constitutional: Positive for activity change, appetite change and fatigue. Negative for fever.  HENT: Negative for congestion and rhinorrhea.   Respiratory: Positive for cough and shortness of breath.   Cardiovascular: Negative for chest pain and leg  swelling.  Gastrointestinal: Negative for nausea, vomiting and abdominal pain.  Genitourinary: Negative for dysuria and hematuria.  Musculoskeletal: Negative for back pain.  Skin: Negative for rash.  Neurological: Negative for dizziness, weakness and headaches.  A complete 10 system review of systems was obtained and all systems are negative except as noted in the HPI and PMH.    Allergies  Morphine and related  Home Medications   Current Outpatient Rx  Name  Route  Sig  Dispense  Refill  . acetaminophen (TYLENOL) 500 MG tablet   Oral   Take 1,000 mg by mouth every 6 (six) hours as needed. For pain          . albuterol (PROVENTIL HFA;VENTOLIN HFA) 108 (90 BASE) MCG/ACT inhaler   Inhalation   Inhale 2 puffs into the lungs every 6 (six) hours as needed. For shortness of breath          . albuterol (PROVENTIL) (2.5 MG/3ML) 0.083% nebulizer solution   Nebulization   Take 2.5 mg by nebulization 3 (three) times daily.         Marland Kitchen aspirin 81 MG chewable tablet   Oral   Chew 81 mg by mouth daily.         . cyclobenzaprine (FLEXERIL) 10 MG tablet   Oral   Take 10 mg by mouth 2 (two) times daily.           Marland Kitchen  dextromethorphan-guaiFENesin (MUCINEX DM) 30-600 MG per 12 hr tablet   Oral   Take 1 tablet by mouth every 12 (twelve) hours as needed. For congestion         . finasteride (PROSCAR) 5 MG tablet   Oral   Take 5 mg by mouth daily.         . Fluticasone-Salmeterol (ADVAIR) 250-50 MCG/DOSE AEPB   Inhalation   Inhale 1 puff into the lungs every 12 (twelve) hours.           . gabapentin (NEURONTIN) 300 MG capsule   Oral   Take 300 mg by mouth at bedtime.         Marland Kitchen HYDROcodone-acetaminophen (NORCO) 5-325 MG per tablet   Oral   Take 1 tablet by mouth every 6 (six) hours as needed. For shingles pain         . Milnacipran (SAVELLA) 50 MG TABS   Oral   Take 50 mg by mouth 2 (two) times daily.           . roflumilast (DALIRESP) 500 MCG TABS tablet    Oral   Take 1 tablet (500 mcg total) by mouth daily.   30 tablet   12   . SALINE NASAL MIST NA   Nasal   Place 1-2 sprays into the nose as needed (nasal congestion).         . tadalafil (CIALIS) 5 MG tablet   Oral   Take 5 mg by mouth daily as needed for erectile dysfunction.          Marland Kitchen tiotropium (SPIRIVA) 18 MCG inhalation capsule   Inhalation   Place 18 mcg into inhaler and inhale daily.             BP 104/70  Pulse 110  Temp(Src) 99.9 F (37.7 C) (Rectal)  Resp 28  SpO2 96%  Physical Exam  Constitutional: He is oriented to person, place, and time. He appears well-developed and well-nourished. He appears distressed.  Mild respiratory distress, speaking in full sentences  HENT:  Head: Normocephalic and atraumatic.  Mouth/Throat: Oropharynx is clear and moist. No oropharyngeal exudate.  Eyes: Conjunctivae are normal. Pupils are equal, round, and reactive to light.  Neck: Normal range of motion. Neck supple.  Cardiovascular: Normal rate, regular rhythm and normal heart sounds.   No murmur heard. Pulmonary/Chest: He is in respiratory distress. He has wheezes.  Bibasilar rhonchi with diffuse wheezes  Abdominal: Soft. There is no tenderness. There is no rebound and no guarding.  Musculoskeletal: Normal range of motion. He exhibits no edema and no tenderness.  Neurological: He is alert and oriented to person, place, and time. No cranial nerve deficit. He exhibits normal muscle tone. Coordination normal.  Skin: Skin is warm.    ED Course  Procedures (including critical care time)  Labs Reviewed  CBC WITH DIFFERENTIAL - Abnormal; Notable for the following:    RBC 3.71 (*)    Hemoglobin 10.4 (*)    HCT 33.8 (*)    Lymphocytes Relative 7 (*)    Monocytes Absolute 1.1 (*)    Eosinophils Relative 6 (*)    All other components within normal limits  BASIC METABOLIC PANEL - Abnormal; Notable for the following:    Chloride 94 (*)    CO2 36 (*)    Glucose, Bld 113  (*)    All other components within normal limits  BLOOD GAS, ARTERIAL - Abnormal; Notable for the following:    pH, Arterial 7.346 (*)  pCO2 arterial 66.6 (*)    Bicarbonate 35.5 (*)    Acid-Base Excess 9.7 (*)    All other components within normal limits  TROPONIN I  PRO B NATRIURETIC PEPTIDE   Dg Chest Portable 1 View  11/01/2012  *RADIOLOGY REPORT*  Clinical Data: Shortness of breath.  PORTABLE CHEST - 1 VIEW  Comparison: 09/25/2012.  Findings: The cardiac silhouette, mediastinal and hilar contours are stable.  Stable underlying emphysematous changes with apical bulla.  Suspect asymmetric perihilar pulmonary edema.  No pleural effusion.  IMPRESSION: Severe chronic emphysematous changes with suspected overlying asymmetric perihilar edema.   Original Report Authenticated By: Rudie Meyer, M.D.      1. COPD exacerbation       MDM  Oxygen dependent COPD presenting with worsening difficulty breathing, hypoxia, cough and fever. No distress on 3 L in the mid 90s. Denies chest pain. Denies any leg pain or swelling.  Severe emphysema a chest x-ray without infiltrate. Mild CO2 retention on ABG but much improved from last month when he was admitted. Patient given nebulizers, steroids, antibiotics. Work of breathing and wheezing improved.  Hemoglobin 10.4 from 12. FOBT negative. Patient unable to ambulate and desaturates to 70% with movement. CTPE pending at time of admission.  Suspect COPD exacerbation with possible PNA.  Admission discussed with Dr. Sudie Bailey.   Date: 11/01/2012  Rate: 113  Rhythm: sinus tachycardia  QRS Axis: normal  Intervals: normal  ST/T Wave abnormalities: normal  Conduction Disutrbances:none  Narrative Interpretation:   Old EKG Reviewed: unchanged  CRITICAL CARE Performed by: Glynn Octave   Total critical care time: 30  Critical care time was exclusive of separately billable procedures and treating other patients.  Critical care was necessary to  treat or prevent imminent or life-threatening deterioration.  Critical care was time spent personally by me on the following activities: development of treatment plan with patient and/or surrogate as well as nursing, discussions with consultants, evaluation of patient's response to treatment, examination of patient, obtaining history from patient or surrogate, ordering and performing treatments and interventions, ordering and review of laboratory studies, ordering and review of radiographic studies, pulse oximetry and re-evaluation of patient's condition.      Glynn Octave, MD 11/01/12 2059

## 2012-11-01 NOTE — ED Notes (Signed)
Fever, cough for 2 days. Increased sob for 2 days, today is worse copd. Is on home 02 at 2l, unable to keep sat over 85 at home unless on cpap/

## 2012-11-01 NOTE — ED Notes (Signed)
Pt returned from ct, scan transported to icu

## 2012-11-01 NOTE — ED Notes (Signed)
Report called to cindy, ppt in ct at present

## 2012-11-01 NOTE — Progress Notes (Signed)
Per T. Aubery Lapping, RN the doctor stated the BiPAP is PRN at this point. Patient to be admitted and is to be ordered BiPAP at night. Patient is adamant about use of nasal mask with BiPAP.

## 2012-11-01 NOTE — ED Notes (Signed)
Pt ambulated into hallway on 02, sats dropped into 70's, md at bedside and aware.

## 2012-11-02 LAB — BLOOD GAS, ARTERIAL
Bicarbonate: 33.8 mEq/L — ABNORMAL HIGH (ref 20.0–24.0)
TCO2: 31.7 mmol/L (ref 0–100)
pCO2 arterial: 64.6 mmHg (ref 35.0–45.0)
pH, Arterial: 7.339 — ABNORMAL LOW (ref 7.350–7.450)

## 2012-11-02 MED ORDER — VANCOMYCIN HCL IN DEXTROSE 1-5 GM/200ML-% IV SOLN
1000.0000 mg | Freq: Two times a day (BID) | INTRAVENOUS | Status: DC
  Administered 2012-11-02 – 2012-11-06 (×9): 1000 mg via INTRAVENOUS
  Filled 2012-11-02 (×11): qty 200

## 2012-11-02 MED ORDER — PIPERACILLIN-TAZOBACTAM 3.375 G IVPB
3.3750 g | Freq: Three times a day (TID) | INTRAVENOUS | Status: DC
  Administered 2012-11-02 – 2012-11-06 (×13): 3.375 g via INTRAVENOUS
  Filled 2012-11-02 (×17): qty 50

## 2012-11-02 MED ORDER — ALBUTEROL SULFATE (5 MG/ML) 0.5% IN NEBU
2.5000 mg | INHALATION_SOLUTION | RESPIRATORY_TRACT | Status: DC | PRN
  Administered 2012-11-02 – 2012-11-04 (×2): 2.5 mg via RESPIRATORY_TRACT
  Filled 2012-11-02 (×2): qty 0.5

## 2012-11-02 NOTE — H&P (Signed)
Connor Armstrong, KOZLOV             ACCOUNT NO.:  [id number]  MEDICAL RECORD NO.:  [id number]  LOCATION:  IC03                          FACILITY:  [facility]  PHYSICIAN:  Mila Homer. Sudie Bailey, M.D.DATE OF BIRTH:  [birth year]  DATE OF ADMISSION:  11/01/2012 DATE OF DISCHARGE:  LH                             HISTORY & PHYSICAL   This 62 year old has had a problem maintaining O2 saturation at home in the last 2 days.  O2 sats had dipped into the 80s, and he has had to be on CPAP at home.  His wife called me early this afternoon about this, and I recommended, given his COPD, that he be evaluated in the emergency room.  It turns out that he was admitted here about 5 weeks ago, in fact, was intubated.  He had a number of admissions for COPD last year as well.  He smoked for many years but stopped 6 weeks ago and really has felt a lot better since stopping.  He is originally from [place], has lived here 22 years with his wife, who is also from [place].  In addition to COPD he has hypertension, fibromyalgia, and has had a kidney cancer.  Surgery includes surgery on his thyroid for unknown reasons, hernia repair, parathyroid surgery, and kidney surgeries.  During his last hospitalization he developed paralytic ileus, presumably secondary to use of narcotics such as morphine.  He also had inability to urinate and actually had to self-catheterize himself for several weeks before that cleared.  HOME MEDS: 1. Acetaminophen 1000 mg q.6h. p.r.n. 2. Albuterol inhaler, albuterol by nebulizer. 3. Aspirin 81 mg daily. 4. Cyclobenzaprine 10 mg b.i.d. 5. Dexamethorphan/guaifenesin tablet q.12h. p.r.n. cough. 6. Mucinex DM 30/600 q.12h. p.r.n. congestion. 7. Finasteride 5 mg daily. 8. Fluticasone/salmeterol 250/50 puff q.12h. 9. Gabapentin 300 mg at bedtime. 10.Hydrocodone/APAP 1 tablets q.6h. for pain, such as the pain of     shingles. 11.Savella 50 mg b.i.d. 12.__________ 500 mg  daily. 13.Saline nasal mist. 14.Cialis 5 mg daily. 15.Spiriva 18 mcg inhaled daily.  He has the difficulty with morphine as noted above.  ADMISSION PHYSICAL EXAM:  In the emergency room showed a very dyspneic man who was tachycardic.  By the time I saw him, temperature 98.3. Pulse still elevated at 114, respiratory rate 24, blood pressure 114/71, O2 saturation 91%. He was in the ICU step-down unit sitting up in bed, feet on the floor. He was well-developed and thin.  He was alert and oriented. SKIN:  Turgor was normal.  Mucous membranes moist. LUNGS:  His lungs showed decreased breath sounds throughout. HEART:  His heart rate was about 100.  Heart sounds are quite faint. ABDOMEN:  Soft, without tenderness, without organomegaly or mass. There is no edema of the ankles.  The feet were warm, but there are somewhat scaly and the loss of hair on the dorsi of the toes.  There was rubor as well.  There were 1+ dorsalis pedis pulses, however.  Admission white cell count was 10,200, hemoglobin 10.4.  Chem profile showed a glucose of 113.  D-dimer is 1.49.  Fecal occult blood was negative.  Chest x-ray showed severe chronic emphysematous changes and possible perihilar edema.  CT  angio of the chest showed no pulmonary emboli but multiple nodular and masslike areas of airspace opacification throughout the lungs bilaterally, felt to represent a multifocal atypical infectious process, even a fungal pneumonia.  Because several of these nodules were spiculated, there was some concern about neoplasm.  There was also diffuse bronchial wall thickening with severe centrilobular paraseptal emphysema, compatible with advanced COPD.  Blood gases on 3 L showed a pH of 7.346, pCO2 67, pO2 97, bicarb 35.  ADMISSION DIAGNOSES: 1. Chronic obstructive pulmonary disease exacerbation with CO2     retention. 2. Abnormal CT scan of the chest. 3. Tobacco use disorder. 4. Fibromyalgia. 5. Question benign  prostatic hypertrophy. 6. Status post renal cell carcinoma. 7. Benign essential hypertension. The patient is doing much better on O2 and albuterol.  He is on high- dose steroids, again, in the step-down unit.  Blood gases will be repeated in the morning.  He is on levofloxacin.  He is also on Lovenox anticoagulation.  He will probably require another CT of the lung in the near future to evaluate the lung masses.     Mila Homer. Sudie Bailey, M.D.     SDK/MEDQ  D:  11/01/2012  T:  11/02/2012  Job:  [id number]

## 2012-11-02 NOTE — Progress Notes (Signed)
ANTICOAGULATION CONSULT NOTE - Initial Consult  Pharmacy Consult for Lovenox Indication: VTE prophylaxis  Allergies  Allergen Reactions  . Morphine And Related Other (See Comments)    Paralysis, "shuts down kidney and bowel tract"     Patient Measurements: Height: 5\' 10"  (177.8 cm) Weight: 143 lb 11.8 oz (65.2 kg) IBW/kg (Calculated) : 73 Heparin Dosing Weight: 65.2 kg  Vital Signs: Temp: 98 F (36.7 C) (02/23 0400) Temp src: Oral (02/23 0400) BP: 119/72 mmHg (02/23 0600) Pulse Rate: 92 (02/23 0600)  Labs:  Recent Labs  11/01/12 1443  HGB 10.4*  HCT 33.8*  PLT 390  CREATININE 0.88  TROPONINI <0.30    Estimated Creatinine Clearance: 81.3 ml/min (by C-G formula based on Cr of 0.88).   Medical History: Past Medical History  Diagnosis Date  . COPD (chronic obstructive pulmonary disease)   . Hypertension   . Fibromyalgia   . Shingles   . Cancer     kidney    Medications:  Scheduled:  . [COMPLETED] sodium chloride   Intravenous STAT  . [COMPLETED] albuterol  2.5 mg Nebulization Once  . albuterol  2.5 mg Nebulization TID  . aspirin  81 mg Oral Daily  . cyclobenzaprine  10 mg Oral BID  . enoxaparin (LOVENOX) injection  40 mg Subcutaneous Q24H  . finasteride  5 mg Oral Daily  . gabapentin  300 mg Oral QHS  . [COMPLETED] ipratropium  0.5 mg Nebulization Once  . [COMPLETED] levofloxacin (LEVAQUIN) IV  500 mg Intravenous Once  . [COMPLETED] magnesium sulfate  2 g Intravenous Once  . [COMPLETED] methylPREDNISolone (SOLU-MEDROL) injection  125 mg Intravenous Once  . methylPREDNISolone (SOLU-MEDROL) injection  125 mg Intravenous Q6H  . Milnacipran  50 mg Oral BID  . piperacillin-tazobactam (ZOSYN)  IV  3.375 g Intravenous Q8H  . roflumilast  500 mcg Oral Daily  . tiotropium  18 mcg Inhalation Daily  . vancomycin  1,000 mg Intravenous Q12H    Assessment: Patient weight > 45 kg CrCl > 30 ml/min  Goal of Therapy:  Lovenox DVT prophylaxis dosing Monitor  platelets by anticoagulation protocol: Yes   Plan:  Lovenox 40 mg SQ every 24 hours Monitor platelets Monitor renal function Labs per protocol  Raquel James, [place] Bennett 11/02/2012,7:27 AM

## 2012-11-02 NOTE — Progress Notes (Signed)
ANTIBIOTIC CONSULT NOTE - INITIAL  Pharmacy Consult for Vancomycin & Zosyn Indication: pneumonia  Allergies  Allergen Reactions  . Morphine And Related Other (See Comments)    Paralysis, "shuts down kidney and bowel tract"     Patient Measurements: Height: 5\' 10"  (177.8 cm) Weight: 143 lb 11.8 oz (65.2 kg) IBW/kg (Calculated) : 73  Vital Signs: Temp: 98 F (36.7 C) (02/23 0400) Temp src: Oral (02/23 0400) BP: 119/72 mmHg (02/23 0600) Pulse Rate: 92 (02/23 0600) Intake/Output from previous day: 02/22 0701 - 02/23 0700 In: 1391.7 [P.O.:720; I.V.:671.7] Out: 700 [Urine:700] Intake/Output from this shift:    Labs:  Recent Labs  11/01/12 1443  WBC 10.2  HGB 10.4*  PLT 390  CREATININE 0.88   Estimated Creatinine Clearance: 81.3 ml/min (by C-G formula based on Cr of 0.88). No results found for this basename: VANCOTROUGH, Leodis Binet, VANCORANDOM, GENTTROUGH, GENTPEAK, GENTRANDOM, TOBRATROUGH, TOBRAPEAK, TOBRARND, AMIKACINPEAK, AMIKACINTROU, AMIKACIN,  in the last 72 hours   Microbiology: Recent Results (from the past 720 hour(s))  MRSA PCR SCREENING     Status: None   Collection Time    11/01/12  7:10 PM      Result Value Range Status   MRSA by PCR NEGATIVE  NEGATIVE Final   Comment:            The GeneXpert MRSA Assay (FDA     approved for NASAL specimens     only), is one component of a     comprehensive MRSA colonization     surveillance program. It is not     intended to diagnose MRSA     infection nor to guide or     monitor treatment for     MRSA infections.    Medical History: Past Medical History  Diagnosis Date  . COPD (chronic obstructive pulmonary disease)   . Hypertension   . Fibromyalgia   . Shingles   . Cancer     kidney    Medications:  Scheduled:  . [COMPLETED] sodium chloride   Intravenous STAT  . [COMPLETED] albuterol  2.5 mg Nebulization Once  . albuterol  2.5 mg Nebulization TID  . aspirin  81 mg Oral Daily  . cyclobenzaprine   10 mg Oral BID  . enoxaparin (LOVENOX) injection  40 mg Subcutaneous Q24H  . finasteride  5 mg Oral Daily  . gabapentin  300 mg Oral QHS  . [COMPLETED] ipratropium  0.5 mg Nebulization Once  . [COMPLETED] levofloxacin (LEVAQUIN) IV  500 mg Intravenous Once  . [COMPLETED] magnesium sulfate  2 g Intravenous Once  . [COMPLETED] methylPREDNISolone (SOLU-MEDROL) injection  125 mg Intravenous Once  . methylPREDNISolone (SOLU-MEDROL) injection  125 mg Intravenous Q6H  . Milnacipran  50 mg Oral BID  . piperacillin-tazobactam (ZOSYN)  IV  3.375 g Intravenous Q8H  . roflumilast  500 mcg Oral Daily  . tiotropium  18 mcg Inhalation Daily  . vancomycin  1,000 mg Intravenous Q12H   Assessment: Suspected pneumonia Patient weight 65.2 kg CrCl 81.3 ml/min  Goal of Therapy:  Vancomycin trough level 15-20 mcg/ml  Plan:  Zosyn 3.375 GM IV every 8 hours Vancomycin 1 GM IV every 12 hours Vancomycin trough at steady state Labs per protocol  Raquel James, Shatori Bertucci Bennett 11/02/2012,7:23 AM

## 2012-11-02 NOTE — Progress Notes (Signed)
Subjective: He was admitted with pneumonia which is health-care related. He has severe COPD. He says he feels better  Objective: Vital signs in last 24 hours: Temp:  [97.4 F (36.3 C)-98.1 F (36.7 C)] 97.6 F (36.4 C) (02/23 1600) Pulse Rate:  [92-114] 102 (02/23 1800) Resp:  [18-28] 23 (02/23 1800) BP: (103-133)/(47-79) 120/60 mmHg (02/23 1800) SpO2:  [87 %-98 %] 94 % (02/23 1800) Weight:  [65.2 kg (143 lb 11.8 oz)] 65.2 kg (143 lb 11.8 oz) (02/23 0500) Weight change:  Last BM Date: 11/01/12  Intake/Output from previous day: 02/22 0701 - 02/23 0700 In: 1441.7 [P.O.:720; I.V.:721.7] Out: 700 [Urine:700]  PHYSICAL EXAM General appearance: alert, cooperative and mild distress Resp: rhonchi bilaterally Cardio: regular rate and rhythm, S1, S2 normal, no murmur, click, rub or gallop GI: soft, non-tender; bowel sounds normal; no masses,  no organomegaly Extremities: extremities normal, atraumatic, no cyanosis or edema  Lab Results:    Basic Metabolic Panel:  Recent Labs  16/10/96 1443  NA 136  K 4.1  CL 94*  CO2 36*  GLUCOSE 113*  BUN 17  CREATININE 0.88  CALCIUM 9.4   Liver Function Tests: No results found for this basename: AST, ALT, ALKPHOS, BILITOT, PROT, ALBUMIN,  in the last 72 hours No results found for this basename: LIPASE, AMYLASE,  in the last 72 hours No results found for this basename: AMMONIA,  in the last 72 hours CBC:  Recent Labs  11/01/12 1443  WBC 10.2  NEUTROABS 7.7  HGB 10.4*  HCT 33.8*  MCV 91.1  PLT 390   Cardiac Enzymes:  Recent Labs  11/01/12 1443  TROPONINI <0.30   BNP:  Recent Labs  11/01/12 1443  PROBNP 62.2   D-Dimer:  Recent Labs  11/01/12 1435  DDIMER 1.49*   CBG: No results found for this basename: GLUCAP,  in the last 72 hours Hemoglobin A1C: No results found for this basename: HGBA1C,  in the last 72 hours Fasting Lipid Panel: No results found for this basename: CHOL, HDL, LDLCALC, TRIG, CHOLHDL,  LDLDIRECT,  in the last 72 hours Thyroid Function Tests: No results found for this basename: TSH, T4TOTAL, FREET4, T3FREE, THYROIDAB,  in the last 72 hours Anemia Panel: No results found for this basename: VITAMINB12, FOLATE, FERRITIN, TIBC, IRON, RETICCTPCT,  in the last 72 hours Coagulation: No results found for this basename: LABPROT, INR,  in the last 72 hours Urine Drug Screen: Drugs of Abuse  No results found for this basename: labopia, cocainscrnur, labbenz, amphetmu, thcu, labbarb    Alcohol Level: No results found for this basename: ETH,  in the last 72 hours Urinalysis: No results found for this basename: COLORURINE, APPERANCEUR, LABSPEC, PHURINE, GLUCOSEU, HGBUR, BILIRUBINUR, KETONESUR, PROTEINUR, UROBILINOGEN, NITRITE, LEUKOCYTESUR,  in the last 72 hours Misc. Labs:  ABGS  Recent Labs  11/02/12 0850  PHART 7.339*  PO2ART 60.7*  TCO2 31.7  HCO3 33.8*   CULTURES Recent Results (from the past 240 hour(s))  MRSA PCR SCREENING     Status: None   Collection Time    11/01/12  7:10 PM      Result Value Range Status   MRSA by PCR NEGATIVE  NEGATIVE Final   Comment:            The GeneXpert MRSA Assay (FDA     approved for NASAL specimens     only), is one component of a     comprehensive MRSA colonization     surveillance program. It is  not     intended to diagnose MRSA     infection nor to guide or     monitor treatment for     MRSA infections.   Studies/Results: Ct Angio Chest Pe W/cm &/or Wo Cm  11/01/2012  *RADIOLOGY REPORT*  Clinical Data: Shortness of breath.  Hypoxia.  CT ANGIOGRAPHY CHEST  Technique:  Multidetector CT imaging of the chest using the standard protocol during bolus administration of intravenous contrast. Multiplanar reconstructed images including MIPs were obtained and reviewed to evaluate the vascular anatomy.  Contrast: OMNIPAQUE IOHEXOL 350 MG/ML SOLN  Comparison: Chest CT 05/24/2004.  Findings:  Mediastinum: No filling defects within  the pulmonary arterial tree to suggest underlying pulmonary embolism. Heart size is normal. A small amount of pericardial fluid and/or thickening, unlikely to be of hemodynamic significance at this time.  No associated pericardial calcification.  There are numerous borderline enlarged mediastinal and bilateral hilar lymph nodes, largest of which measures up to 9 mm in short axis in the AP window.  These are presumably reactive.  Postoperative changes of a right hemithyroidectomy.  The esophagus is unremarkable in appearance.  Lungs/Pleura: There is severe centrilobular and paraseptal emphysema, with numerous bilateral bullae and blebs, most pronounced at the apices of the lungs bilaterally.  There are numerous nodular masslike airspace opacities in the lungs bilaterally, particularly in the periphery of the left upper lobe. These are favored to be infectious, however, some of them are somewhat solid in appearance and warrant close attention on follow- up studies, including a 13 mm nodule with spiculated margins in the left upper lobe (image 66 of series 5) and a 9 mm nodule with spiculated margins in the medial segment of the right middle lobe (image 61 of series 5).  Mild diffuse bronchial wall thickening. No pleural effusions.  Upper Abdomen: Surgical clips in the upper right retroperitoneum.  Musculoskeletal: There are no aggressive appearing lytic or blastic lesions noted in the visualized portions of the skeleton.  IMPRESSION: 1.  No evidence of pulmonary embolism. 2.  Multiple nodular and mass like areas of air space opacification throughout the lungs bilaterally.  The overall appearance is favored to represent a multi focal atypical infectious process, potentially even fungal pneumonia.  Clinical correlation is recommended.  The possibility of neoplasm is not excluded, and accordingly, close attention on short-term repeat chest CT in 3 months is recommended. 3.  Diffuse bronchial wall thickening with severe  centrilobular paraseptal emphysema; imaging findings compatible with advanced COPD. 4.  Small amount of anterior pericardial fluid and/or thickening, unlikely to be of hemodynamic significance at this time.   Original Report Authenticated By: Trudie Reed, M.D.    Dg Chest Portable 1 View  11/01/2012  *RADIOLOGY REPORT*  Clinical Data: Shortness of breath.  PORTABLE CHEST - 1 VIEW  Comparison: 09/25/2012.  Findings: The cardiac silhouette, mediastinal and hilar contours are stable.  Stable underlying emphysematous changes with apical bulla.  Suspect asymmetric perihilar pulmonary edema.  No pleural effusion.  IMPRESSION: Severe chronic emphysematous changes with suspected overlying asymmetric perihilar edema.   Original Report Authenticated By: Rudie Meyer, M.D.     Medications:  Prior to Admission:  Prescriptions prior to admission  Medication Sig Dispense Refill  . acetaminophen (TYLENOL) 500 MG tablet Take 1,000 mg by mouth every 6 (six) hours as needed. For pain       . albuterol (PROVENTIL HFA;VENTOLIN HFA) 108 (90 BASE) MCG/ACT inhaler Inhale 2 puffs into the lungs every 6 (six) hours  as needed. For shortness of breath       . albuterol (PROVENTIL) (2.5 MG/3ML) 0.083% nebulizer solution Take 2.5 mg by nebulization 3 (three) times daily.      Marland Kitchen aspirin 81 MG chewable tablet Chew 81 mg by mouth daily.      . cyclobenzaprine (FLEXERIL) 10 MG tablet Take 10 mg by mouth 2 (two) times daily.        Marland Kitchen dextromethorphan-guaiFENesin (MUCINEX DM) 30-600 MG per 12 hr tablet Take 1 tablet by mouth every 12 (twelve) hours as needed. For congestion      . finasteride (PROSCAR) 5 MG tablet Take 5 mg by mouth daily.      . Fluticasone-Salmeterol (ADVAIR) 250-50 MCG/DOSE AEPB Inhale 1 puff into the lungs every 12 (twelve) hours.        . gabapentin (NEURONTIN) 300 MG capsule Take 300 mg by mouth at bedtime.      Marland Kitchen HYDROcodone-acetaminophen (NORCO) 5-325 MG per tablet Take 1 tablet by mouth every 6 (six)  hours as needed. For shingles pain      . Milnacipran (SAVELLA) 50 MG TABS Take 50 mg by mouth 2 (two) times daily.        . roflumilast (DALIRESP) 500 MCG TABS tablet Take 1 tablet (500 mcg total) by mouth daily.  30 tablet  12  . SALINE NASAL MIST NA Place 1-2 sprays into the nose as needed (nasal congestion).      . tadalafil (CIALIS) 5 MG tablet Take 5 mg by mouth daily as needed for erectile dysfunction.       Marland Kitchen tiotropium (SPIRIVA) 18 MCG inhalation capsule Place 18 mcg into inhaler and inhale daily.         Scheduled: . albuterol  2.5 mg Nebulization TID  . aspirin  81 mg Oral Daily  . cyclobenzaprine  10 mg Oral BID  . enoxaparin (LOVENOX) injection  40 mg Subcutaneous Q24H  . finasteride  5 mg Oral Daily  . gabapentin  300 mg Oral QHS  . methylPREDNISolone (SOLU-MEDROL) injection  125 mg Intravenous Q6H  . Milnacipran  50 mg Oral BID  . piperacillin-tazobactam (ZOSYN)  IV  3.375 g Intravenous Q8H  . roflumilast  500 mcg Oral Daily  . tiotropium  18 mcg Inhalation Daily  . vancomycin  1,000 mg Intravenous Q12H   Continuous: . sodium chloride 50 mL/hr at 11/02/12 1900   ZOX:[id number], albuterol, dextromethorphan-guaiFENesin, HYDROcodone-acetaminophen  Assesment:he has healthcare associated pneumonia. He has severe COPD. He has fibromyalgia  Active Problems:   * No active hospital problems. *    Plan:no change in treatments except to increase his antibiotic    LOS: 1 day   Kenzlie Disch L 11/02/2012, 7:37 PM

## 2012-11-03 ENCOUNTER — Inpatient Hospital Stay (HOSPITAL_COMMUNITY): Payer: Medicare Other

## 2012-11-03 ENCOUNTER — Encounter (HOSPITAL_COMMUNITY): Payer: Medicare Other

## 2012-11-03 DIAGNOSIS — J189 Pneumonia, unspecified organism: Secondary | ICD-10-CM | POA: Diagnosis present

## 2012-11-03 LAB — BASIC METABOLIC PANEL
CO2: 37 mEq/L — ABNORMAL HIGH (ref 19–32)
Calcium: 9.2 mg/dL (ref 8.4–10.5)
Creatinine, Ser: 1 mg/dL (ref 0.50–1.35)
GFR calc non Af Amer: 79 mL/min — ABNORMAL LOW (ref >90)
Sodium: 140 mEq/L (ref 135–145)

## 2012-11-03 LAB — CBC
MCV: 90.2 fL (ref 78.0–100.0)
Platelets: 446 10*3/uL — ABNORMAL HIGH (ref 150–400)
RBC: 3.67 MIL/uL — ABNORMAL LOW (ref 4.22–5.81)
RDW: 14.9 % (ref 11.5–15.5)
WBC: 20.8 10*3/uL — ABNORMAL HIGH (ref 4.0–10.5)

## 2012-11-03 LAB — OCCULT BLOOD, POC DEVICE: Fecal Occult Bld: NEGATIVE

## 2012-11-03 MED ORDER — SODIUM CHLORIDE 0.9 % IJ SOLN
10.0000 mL | Freq: Two times a day (BID) | INTRAMUSCULAR | Status: DC
  Administered 2012-11-03 – 2012-11-06 (×5): 10 mL

## 2012-11-03 MED ORDER — SODIUM CHLORIDE 0.9 % IJ SOLN: 10.0000 mL | INTRAMUSCULAR | Status: DC | PRN

## 2012-11-03 NOTE — Progress Notes (Signed)
Subjective: He has healthcare associated pneumonia. This may be atypical or even fungal based on the appearance on the x-ray. Considering his extensive smoking history this could be a neoplasm but I think that's less likely  Objective: Vital signs in last 24 hours: Temp:  [97.4 F (36.3 C)-98.5 F (36.9 C)] 98.5 F (36.9 C) (02/24 0400) Pulse Rate:  [88-109] 89 (02/24 0600) Resp:  [19-28] 23 (02/24 0600) BP: (102-136)/(45-77) 128/72 mmHg (02/24 0600) SpO2:  [87 %-100 %] 97 % (02/24 0702) Weight:  [69.1 kg (152 lb 5.4 oz)] 69.1 kg (152 lb 5.4 oz) (02/24 0434) Weight change: 4.1 kg (9 lb 0.6 oz) Last BM Date: 11/02/12  Intake/Output from previous day: 02/23 0701 - 02/24 0700 In: 1957.5 [P.O.:600; I.V.:807.5; IV Piggyback:550] Out: 1675 [Urine:1675]  PHYSICAL EXAM General appearance: alert, cooperative and mild distress Resp: wheezes bilaterally Cardio: regular rate and rhythm, S1, S2 normal, no murmur, click, rub or gallop GI: soft, non-tender; bowel sounds normal; no masses,  no organomegaly Extremities: extremities normal, atraumatic, no cyanosis or edema  Lab Results:    Basic Metabolic Panel:  Recent Labs  16/10/96 1443 11/03/12 0433  NA 136 140  K 4.1 5.1  CL 94* 99  CO2 36* 37*  GLUCOSE 113* 156*  BUN 17 19  CREATININE 0.88 1.00  CALCIUM 9.4 9.2   Liver Function Tests: No results found for this basename: AST, ALT, ALKPHOS, BILITOT, PROT, ALBUMIN,  in the last 72 hours No results found for this basename: LIPASE, AMYLASE,  in the last 72 hours No results found for this basename: AMMONIA,  in the last 72 hours CBC:  Recent Labs  11/01/12 1443 11/03/12 0433  WBC 10.2 20.8*  NEUTROABS 7.7  --   HGB 10.4* 10.4*  HCT 33.8* 33.1*  MCV 91.1 90.2  PLT 390 446*   Cardiac Enzymes:  Recent Labs  11/01/12 1443  TROPONINI <0.30   BNP:  Recent Labs  11/01/12 1443  PROBNP 62.2   D-Dimer:  Recent Labs  11/01/12 1435  DDIMER 1.49*   CBG: No  results found for this basename: GLUCAP,  in the last 72 hours Hemoglobin A1C: No results found for this basename: HGBA1C,  in the last 72 hours Fasting Lipid Panel: No results found for this basename: CHOL, HDL, LDLCALC, TRIG, CHOLHDL, LDLDIRECT,  in the last 72 hours Thyroid Function Tests: No results found for this basename: TSH, T4TOTAL, FREET4, T3FREE, THYROIDAB,  in the last 72 hours Anemia Panel: No results found for this basename: VITAMINB12, FOLATE, FERRITIN, TIBC, IRON, RETICCTPCT,  in the last 72 hours Coagulation: No results found for this basename: LABPROT, INR,  in the last 72 hours Urine Drug Screen: Drugs of Abuse  No results found for this basename: labopia, cocainscrnur, labbenz, amphetmu, thcu, labbarb    Alcohol Level: No results found for this basename: ETH,  in the last 72 hours Urinalysis: No results found for this basename: COLORURINE, APPERANCEUR, LABSPEC, PHURINE, GLUCOSEU, HGBUR, BILIRUBINUR, KETONESUR, PROTEINUR, UROBILINOGEN, NITRITE, LEUKOCYTESUR,  in the last 72 hours Misc. Labs:  ABGS  Recent Labs  11/02/12 0850  PHART 7.339*  PO2ART 60.7*  TCO2 31.7  HCO3 33.8*   CULTURES Recent Results (from the past 240 hour(s))  MRSA PCR SCREENING     Status: None   Collection Time    11/01/12  7:10 PM      Result Value Range Status   MRSA by PCR NEGATIVE  NEGATIVE Final   Comment:  The GeneXpert MRSA Assay (FDA     approved for NASAL specimens     only), is one component of a     comprehensive MRSA colonization     surveillance program. It is not     intended to diagnose MRSA     infection nor to guide or     monitor treatment for     MRSA infections.   Studies/Results: Ct Angio Chest Pe W/cm &/or Wo Cm  11/01/2012  *RADIOLOGY REPORT*  Clinical Data: Shortness of breath.  Hypoxia.  CT ANGIOGRAPHY CHEST  Technique:  Multidetector CT imaging of the chest using the standard protocol during bolus administration of intravenous contrast.  Multiplanar reconstructed images including MIPs were obtained and reviewed to evaluate the vascular anatomy.  Contrast: OMNIPAQUE IOHEXOL 350 MG/ML SOLN  Comparison: Chest CT 05/24/2004.  Findings:  Mediastinum: No filling defects within the pulmonary arterial tree to suggest underlying pulmonary embolism. Heart size is normal. A small amount of pericardial fluid and/or thickening, unlikely to be of hemodynamic significance at this time.  No associated pericardial calcification.  There are numerous borderline enlarged mediastinal and bilateral hilar lymph nodes, largest of which measures up to 9 mm in short axis in the AP window.  These are presumably reactive.  Postoperative changes of a right hemithyroidectomy.  The esophagus is unremarkable in appearance.  Lungs/Pleura: There is severe centrilobular and paraseptal emphysema, with numerous bilateral bullae and blebs, most pronounced at the apices of the lungs bilaterally.  There are numerous nodular masslike airspace opacities in the lungs bilaterally, particularly in the periphery of the left upper lobe. These are favored to be infectious, however, some of them are somewhat solid in appearance and warrant close attention on follow- up studies, including a 13 mm nodule with spiculated margins in the left upper lobe (image 66 of series 5) and a 9 mm nodule with spiculated margins in the medial segment of the right middle lobe (image 61 of series 5).  Mild diffuse bronchial wall thickening. No pleural effusions.  Upper Abdomen: Surgical clips in the upper right retroperitoneum.  Musculoskeletal: There are no aggressive appearing lytic or blastic lesions noted in the visualized portions of the skeleton.  IMPRESSION: 1.  No evidence of pulmonary embolism. 2.  Multiple nodular and mass like areas of air space opacification throughout the lungs bilaterally.  The overall appearance is favored to represent a multi focal atypical infectious process, potentially even  fungal pneumonia.  Clinical correlation is recommended.  The possibility of neoplasm is not excluded, and accordingly, close attention on short-term repeat chest CT in 3 months is recommended. 3.  Diffuse bronchial wall thickening with severe centrilobular paraseptal emphysema; imaging findings compatible with advanced COPD. 4.  Small amount of anterior pericardial fluid and/or thickening, unlikely to be of hemodynamic significance at this time.   Original Report Authenticated By: Trudie Reed, M.D.    Dg Chest Portable 1 View  11/01/2012  *RADIOLOGY REPORT*  Clinical Data: Shortness of breath.  PORTABLE CHEST - 1 VIEW  Comparison: 09/25/2012.  Findings: The cardiac silhouette, mediastinal and hilar contours are stable.  Stable underlying emphysematous changes with apical bulla.  Suspect asymmetric perihilar pulmonary edema.  No pleural effusion.  IMPRESSION: Severe chronic emphysematous changes with suspected overlying asymmetric perihilar edema.   Original Report Authenticated By: Rudie Meyer, M.D.     Medications:  Prior to Admission:  Prescriptions prior to admission  Medication Sig Dispense Refill  . acetaminophen (TYLENOL) 500 MG tablet Take  1,000 mg by mouth every 6 (six) hours as needed. For pain       . albuterol (PROVENTIL HFA;VENTOLIN HFA) 108 (90 BASE) MCG/ACT inhaler Inhale 2 puffs into the lungs every 6 (six) hours as needed. For shortness of breath       . albuterol (PROVENTIL) (2.5 MG/3ML) 0.083% nebulizer solution Take 2.5 mg by nebulization 3 (three) times daily.      Marland Kitchen aspirin 81 MG chewable tablet Chew 81 mg by mouth daily.      . cyclobenzaprine (FLEXERIL) 10 MG tablet Take 10 mg by mouth 2 (two) times daily.        Marland Kitchen dextromethorphan-guaiFENesin (MUCINEX DM) 30-600 MG per 12 hr tablet Take 1 tablet by mouth every 12 (twelve) hours as needed. For congestion      . finasteride (PROSCAR) 5 MG tablet Take 5 mg by mouth daily.      . Fluticasone-Salmeterol (ADVAIR) 250-50  MCG/DOSE AEPB Inhale 1 puff into the lungs every 12 (twelve) hours.        . gabapentin (NEURONTIN) 300 MG capsule Take 300 mg by mouth at bedtime.      Marland Kitchen HYDROcodone-acetaminophen (NORCO) 5-325 MG per tablet Take 1 tablet by mouth every 6 (six) hours as needed. For shingles pain      . Milnacipran (SAVELLA) 50 MG TABS Take 50 mg by mouth 2 (two) times daily.        . roflumilast (DALIRESP) 500 MCG TABS tablet Take 1 tablet (500 mcg total) by mouth daily.  30 tablet  12  . SALINE NASAL MIST NA Place 1-2 sprays into the nose as needed (nasal congestion).      . tadalafil (CIALIS) 5 MG tablet Take 5 mg by mouth daily as needed for erectile dysfunction.       Marland Kitchen tiotropium (SPIRIVA) 18 MCG inhalation capsule Place 18 mcg into inhaler and inhale daily.         Scheduled: . albuterol  2.5 mg Nebulization TID  . aspirin  81 mg Oral Daily  . cyclobenzaprine  10 mg Oral BID  . enoxaparin (LOVENOX) injection  40 mg Subcutaneous Q24H  . finasteride  5 mg Oral Daily  . gabapentin  300 mg Oral QHS  . methylPREDNISolone (SOLU-MEDROL) injection  125 mg Intravenous Q6H  . Milnacipran  50 mg Oral BID  . piperacillin-tazobactam (ZOSYN)  IV  3.375 g Intravenous Q8H  . roflumilast  500 mcg Oral Daily  . tiotropium  18 mcg Inhalation Daily  . vancomycin  1,000 mg Intravenous Q12H   Continuous: . sodium chloride 50 mL/hr at 11/03/12 0600   WUX:[id number], albuterol, dextromethorphan-guaiFENesin, HYDROcodone-acetaminophen  Assesment: He has healthcare associated pneumonia. He has severe COPD. He is in chronic respiratory failure. He has hypertension which is pretty well controlled. He has fibromyalgia which is doing relatively well. Active Problems:   * No active hospital problems. *    Plan: Continue current treatments. I think he needs a PICC line.    LOS: 2 days   Montine Hight L 11/03/2012, 7:37 AM

## 2012-11-03 NOTE — Evaluation (Signed)
Physical Therapy Evaluation Patient Details Name: Connor Armstrong MRN: [id number] DOB: Mar 06, 1951 Today's Date: 11/03/2012 Time: 0865-7846 PT Time Calculation (min): 25 min  PT Assessment / Plan / Recommendation Clinical Impression  Pt main functional limitation is respiratory in nature.  Pt has normal ROM, balance and strength.  O2 stats decrease quickly with minimal activity but able to recover quickly.    PT Assessment  Patient needs continued PT services    Follow Up Recommendations  No PT follow up    Does the patient have the potential to tolerate intense rehabilitation      Barriers to Discharge None      Equipment Recommendations  None recommended by PT       Frequency Min 3X/week    Precautions / Restrictions Precautions Precautions: None   Pertinent Vitals/Pain None noted      Mobility  Bed Mobility Bed Mobility: Supine to Sit Supine to Sit: 7: Independent Transfers Transfers: Sit to Stand Sit to Stand: 7: Independent Ambulation/Gait Ambulation/Gait Assistance: 6: Modified independent (Device/Increase time) Ambulation Distance (Feet): 70 Feet Assistive device: Rolling walker (O2) Ambulation/Gait Assistance Details: Pt O2 states decreased to 85 with ambulation.  Pt O2 increased to 4L and pt was able to recover in less than 30 seconds.  Pt stated typical distance from room to room in his house is 50 ft. Gait Pattern: Within Functional Limits Gait velocity: slow and steady Stairs: No         PT Diagnosis: Difficulty walking  PT Problem List: Decreased activity tolerance PT Treatment Interventions: Functional mobility training;Therapeutic activities;Therapeutic exercise   PT Goals Acute Rehab PT Goals PT Goal Formulation: With patient Time For Goal Achievement: 11/06/12 Potential to Achieve Goals: Good Pt will Ambulate: 51 - 150 feet;with modified independence (w/ 02 stat staying at 90 or above) Pt will Go Up / Down Stairs: 3-5 stairs;with  supervision (with 02 stat staying at 90 or above) PT Goal: Up/Down Stairs - Progress: Goal set today  Visit Information  Last PT Received On: 11/03/12    Subjective Data  Subjective: Pt states he has a walker but normally does not use it Patient Stated Goal: To go home   Prior Functioning  Home Living Lives With: Spouse Type of Home: House Home Access: Stairs to enter Secretary/administrator of Steps: 5 Entrance Stairs-Rails: Right Home Layout: One level Firefighter: Standard Home Adaptive Equipment: Walker - rolling Prior Function Level of Independence: Independent Able to Take Stairs?: Yes Driving: Yes Vocation: Unemployed Communication Communication: No difficulties Dominant Hand: Right    Cognition  Cognition Overall Cognitive Status: Appears within functional limits for tasks assessed/performed Arousal/Alertness: Awake/alert Behavior During Session: [facility] for tasks performed    Extremity/Trunk Assessment Right Lower Extremity Assessment RLE ROM/Strength/Tone: [facility] for tasks assessed Left Lower Extremity Assessment LLE ROM/Strength/Tone: [facility] for tasks assessed   Balance    End of Session PT - End of Session Equipment Utilized During Treatment: Gait belt;Oxygen Activity Tolerance: Other (comment) (treatment limited due to cardiopulmonary function) Patient left: in chair;with call bell/phone within reach  GP     Allena Pietila,CINDY 11/03/2012, 9:32 AM

## 2012-11-03 NOTE — Progress Notes (Signed)
UR Chart Review Completed  

## 2012-11-03 NOTE — Plan of Care (Signed)
Problem: ICU Phase Progression Outcomes Goal: Dyspnea controlled at rest Outcome: Progressing Pt states he still has dyspnea at rest at times  Problem: Phase I Progression Outcomes Goal: O2 sats > or equal 90% or at baseline Outcome: Completed/Met Date Met:  11/03/12 On O2 Goal: Dyspnea controlled at rest Outcome: Progressing Pt states that he still has dyspnea at rest at times Goal: Flu/PneumoVaccines if indicated Outcome: Not Applicable Date Met:  11/03/12 Pt states that he has the flu vaccine this season and the PNA vaccine three years ago Goal: Pain controlled Outcome: Progressing Pt at goal at this moment, but at times the pt states that it does get above goal still Goal: Tolerating diet Outcome: Completed/Met Date Met:  11/03/12 Pt states that his appetite is up and down but tolerating his diet

## 2012-11-03 NOTE — Care Management Note (Signed)
    Page 1 of 2   11/06/2012     9:22:34 AM   CARE MANAGEMENT NOTE 11/06/2012  Patient:  Connor Armstrong, Connor Armstrong   Account Number:  [id number]  Date Initiated:  11/03/2012  Documentation initiated by:  Sharrie Rothman  Subjective/Objective Assessment:   Pt admitted from home with HCAP. Pt lives with his wife and will return home at discharge. Pt is fairly independent with ADL's. Pt has O2, cpap, walker, and neb machine for home use. Followed by [facility]. Used [facility] in past.     Action/Plan:   WIll need HH at discharge. Will notify [facility] of pts admission. No DME needs noted at this time.   Anticipated DC Date:  11/07/2012   Anticipated DC Plan:  HOME W HOME HEALTH SERVICES      DC Planning Services  CM consult      [facility] Choice  HOME HEALTH   Choice offered to / List presented to:  C-1 Patient        HH arranged  HH-1 RN  IV Antibiotics  HH-2 PT      HH agency  [facility]   Status of service:  In process, will continue to follow Medicare Important Message given?  YES (If response is "NO", the following Medicare IM given date fields will be blank) Date Medicare IM given:  11/06/2012 Date Additional Medicare IM given:    Discharge Disposition:  HOME W HOME HEALTH SERVICES  Per UR Regulation:    If discussed at Long Length of Stay Meetings, dates discussed:   11/06/2012    Comments:  11/06/12 0920 [name], RN BSN CM Pt discharged home today with [facility] for IV AB. Alroy Bailiff of [facility] is aware and will collect the pts information from the chart. Pt does not need any DME. HH services to start this pm for evening dose of IV AB. Pt and pts nurse aware of discharge arrangements.  11/05/12 1025 [name], RN BSN CM Pt potential discharge 11/06/12 home with IV AB. Pt was just discharged from services of [facility] and wants to resume care with them at discharge. Alroy Bailiff of [facility] is aware and will arrange IV AB's. No DME needs noted.  11/03/12 1015 [name], RN BSN  CM

## 2012-11-04 LAB — BASIC METABOLIC PANEL
CO2: 35 mEq/L — ABNORMAL HIGH (ref 19–32)
Chloride: 101 mEq/L (ref 96–112)
Creatinine, Ser: 0.9 mg/dL (ref 0.50–1.35)
Glucose, Bld: 139 mg/dL — ABNORMAL HIGH (ref 70–99)
Sodium: 139 mEq/L (ref 135–145)

## 2012-11-04 NOTE — Progress Notes (Signed)
ANTIBIOTIC CONSULT NOTE   Pharmacy Consult for Vancomycin & Zosyn Indication: pneumonia  Allergies  Allergen Reactions  . Morphine And Related Other (See Comments)    Paralysis, "shuts down kidney and bowel tract"    Patient Measurements: Height: 5\' 10"  (177.8 cm) Weight: 151 lb 14.4 oz (68.9 kg) IBW/kg (Calculated) : 73  Vital Signs: Temp: 97.5 F (36.4 C) (02/25 0800) Temp src: Oral (02/25 0400) BP: 116/80 mmHg (02/25 0900) Pulse Rate: 103 (02/25 0900) Intake/Output from previous day: 02/24 0701 - 02/25 0700 In: 2780 [P.O.:1020; I.V.:1210; IV Piggyback:550] Out: 1600 [Urine:1600] Intake/Output from this shift: Total I/O In: 240 [P.O.:240] Out: 400 [Urine:400]  Labs:  Recent Labs  11/01/12 1443 11/03/12 0433 11/04/12 0525  WBC 10.2 20.8*  --   HGB 10.4* 10.4*  --   PLT 390 446*  --   CREATININE 0.88 1.00 0.90   Estimated Creatinine Clearance: 84 ml/min (by C-G formula based on Cr of 0.9). No results found for this basename: VANCOTROUGH, Leodis Binet, VANCORANDOM, GENTTROUGH, GENTPEAK, GENTRANDOM, TOBRATROUGH, TOBRAPEAK, TOBRARND, AMIKACINPEAK, AMIKACINTROU, AMIKACIN,  in the last 72 hours   Microbiology: Recent Results (from the past 720 hour(s))  MRSA PCR SCREENING     Status: None   Collection Time    11/01/12  7:10 PM      Result Value Range Status   MRSA by PCR NEGATIVE  NEGATIVE Final   Comment:            The GeneXpert MRSA Assay (FDA     approved for NASAL specimens     only), is one component of a     comprehensive MRSA colonization     surveillance program. It is not     intended to diagnose MRSA     infection nor to guide or     monitor treatment for     MRSA infections.  CULTURE, EXPECTORATED SPUTUM-ASSESSMENT     Status: None   Collection Time    11/02/12  8:25 PM      Result Value Range Status   Specimen Description SPUTUM EXPECTORATED   Final   Special Requests NONE   Final   Sputum evaluation     Final   Value: THIS SPECIMEN IS  ACCEPTABLE. RESPIRATORY CULTURE REPORT TO FOLLOW.     Performed at [facility]   Report Status PENDING   Incomplete  CULTURE, RESPIRATORY (NON-EXPECTORATED)     Status: None   Collection Time    11/02/12  8:25 PM      Result Value Range Status   Specimen Description SPUTUM EXPECTORATED   Final   Special Requests NONE   Final   Gram Stain     Final   Value: RARE WBC PRESENT,BOTH PMN AND MONONUCLEAR     RARE SQUAMOUS EPITHELIAL CELLS PRESENT     NO ORGANISMS SEEN   Culture NO GROWTH 1 DAY   Final   Report Status PENDING   Incomplete   Medical History: Past Medical History  Diagnosis Date  . COPD (chronic obstructive pulmonary disease)   . Hypertension   . Fibromyalgia   . Shingles   . Cancer     kidney   Medications:  Scheduled:  . albuterol  2.5 mg Nebulization TID  . aspirin  81 mg Oral Daily  . cyclobenzaprine  10 mg Oral BID  . enoxaparin (LOVENOX) injection  40 mg Subcutaneous Q24H  . finasteride  5 mg Oral Daily  . gabapentin  300 mg Oral QHS  .  methylPREDNISolone (SOLU-MEDROL) injection  125 mg Intravenous Q6H  . Milnacipran  50 mg Oral BID  . piperacillin-tazobactam (ZOSYN)  IV  3.375 g Intravenous Q8H  . roflumilast  500 mcg Oral Daily  . sodium chloride  10-40 mL Intracatheter Q12H  . tiotropium  18 mcg Inhalation Daily  . vancomycin  1,000 mg Intravenous Q12H   Assessment: Suspected pneumonia Good renal fxn.  Estimated Creatinine Clearance: 84 ml/min (by C-G formula based on Cr of 0.9).  Goal of Therapy:  Vancomycin trough level 15-20 mcg/ml  Plan:  Zosyn 3.375 GM IV every 8 hours Vancomycin 1 GM IV every 12 hours Vancomycin trough tomorrow am Labs per protocol  Valrie Hart A 11/04/2012,10:36 AM

## 2012-11-04 NOTE — Progress Notes (Signed)
UR Chart Review Completed  

## 2012-11-04 NOTE — Progress Notes (Signed)
Subjective: He has healthcare associated pneumonia. He now has a PICC line. He still has some episodes of low oxygen when he moves around I think that's probably his baseline and perhaps somewhat worse since he has pneumonia. He has severe end-stage COPD. He says he feels better  Objective: Vital signs in last 24 hours: Temp:  [97.2 F (36.2 C)-98 F (36.7 C)] 97.5 F (36.4 C) (02/25 0400) Pulse Rate:  [74-113] 81 (02/25 0718) Resp:  [14-25] 21 (02/25 0718) BP: (120-150)/(68-100) 150/86 mmHg (02/25 0600) SpO2:  [73 %-100 %] 95 % (02/25 0718) Weight:  [68.9 kg (151 lb 14.4 oz)] 68.9 kg (151 lb 14.4 oz) (02/25 0500) Weight change: -0.2 kg (-7.1 oz) Last BM Date: 11/03/12  Intake/Output from previous day: 02/24 0701 - 02/25 0700 In: 2780 [P.O.:1020; I.V.:1210; IV Piggyback:550] Out: 1600 [Urine:1600]  PHYSICAL EXAM General appearance: alert, cooperative and mild distress Resp: rhonchi LLL and LUL Cardio: regular rate and rhythm, S1, S2 normal, no murmur, click, rub or gallop GI: soft, non-tender; bowel sounds normal; no masses,  no organomegaly Extremities: extremities normal, atraumatic, no cyanosis or edema  Lab Results:    Basic Metabolic Panel:  Recent Labs  16/10/96 0433 11/04/12 0525  NA 140 139  K 5.1 4.5  CL 99 101  CO2 37* 35*  GLUCOSE 156* 139*  BUN 19 21  CREATININE 1.00 0.90  CALCIUM 9.2 8.3*   Liver Function Tests: No results found for this basename: AST, ALT, ALKPHOS, BILITOT, PROT, ALBUMIN,  in the last 72 hours No results found for this basename: LIPASE, AMYLASE,  in the last 72 hours No results found for this basename: AMMONIA,  in the last 72 hours CBC:  Recent Labs  11/01/12 1443 11/03/12 0433  WBC 10.2 20.8*  NEUTROABS 7.7  --   HGB 10.4* 10.4*  HCT 33.8* 33.1*  MCV 91.1 90.2  PLT 390 446*   Cardiac Enzymes:  Recent Labs  11/01/12 1443  TROPONINI <0.30   BNP:  Recent Labs  11/01/12 1443  PROBNP 62.2   D-Dimer:  Recent  Labs  11/01/12 1435  DDIMER 1.49*   CBG: No results found for this basename: GLUCAP,  in the last 72 hours Hemoglobin A1C: No results found for this basename: HGBA1C,  in the last 72 hours Fasting Lipid Panel: No results found for this basename: CHOL, HDL, LDLCALC, TRIG, CHOLHDL, LDLDIRECT,  in the last 72 hours Thyroid Function Tests: No results found for this basename: TSH, T4TOTAL, FREET4, T3FREE, THYROIDAB,  in the last 72 hours Anemia Panel: No results found for this basename: VITAMINB12, FOLATE, FERRITIN, TIBC, IRON, RETICCTPCT,  in the last 72 hours Coagulation: No results found for this basename: LABPROT, INR,  in the last 72 hours Urine Drug Screen: Drugs of Abuse  No results found for this basename: labopia, cocainscrnur, labbenz, amphetmu, thcu, labbarb    Alcohol Level: No results found for this basename: ETH,  in the last 72 hours Urinalysis: No results found for this basename: COLORURINE, APPERANCEUR, LABSPEC, PHURINE, GLUCOSEU, HGBUR, BILIRUBINUR, KETONESUR, PROTEINUR, UROBILINOGEN, NITRITE, LEUKOCYTESUR,  in the last 72 hours Misc. Labs:  ABGS  Recent Labs  11/02/12 0850  PHART 7.339*  PO2ART 60.7*  TCO2 31.7  HCO3 33.8*   CULTURES Recent Results (from the past 240 hour(s))  MRSA PCR SCREENING     Status: None   Collection Time    11/01/12  7:10 PM      Result Value Range Status   MRSA by PCR  NEGATIVE  NEGATIVE Final   Comment:            The GeneXpert MRSA Assay (FDA     approved for NASAL specimens     only), is one component of a     comprehensive MRSA colonization     surveillance program. It is not     intended to diagnose MRSA     infection nor to guide or     monitor treatment for     MRSA infections.  CULTURE, EXPECTORATED SPUTUM-ASSESSMENT     Status: None   Collection Time    11/02/12  8:25 PM      Result Value Range Status   Specimen Description SPUTUM EXPECTORATED   Final   Special Requests NONE   Final   Sputum evaluation      Final   Value: THIS SPECIMEN IS ACCEPTABLE. RESPIRATORY CULTURE REPORT TO FOLLOW.     Performed at [facility]   Report Status PENDING   Incomplete   Studies/Results: Dg Chest Port 1 View  11/03/2012  *RADIOLOGY REPORT*  Clinical Data: Confirm PICC line placement  PORTABLE CHEST - 1 VIEW  Comparison: CT chest dated 11/01/2012  Findings: Right arm PICC terminates at the cavoatrial junction.  Chronic interstitial markings/emphysematous changes.  Patchy lingular opacity, worrisome for pneumonia.  No pleural effusion or pneumothorax.  Surgical clips in the right neck.  IMPRESSION: Right arm PICC terminates at the cavoatrial junction.   Original Report Authenticated By: Charline Bills, M.D.     Medications:  Prior to Admission:  Prescriptions prior to admission  Medication Sig Dispense Refill  . acetaminophen (TYLENOL) 500 MG tablet Take 1,000 mg by mouth every 6 (six) hours as needed. For pain       . albuterol (PROVENTIL HFA;VENTOLIN HFA) 108 (90 BASE) MCG/ACT inhaler Inhale 2 puffs into the lungs every 6 (six) hours as needed. For shortness of breath       . albuterol (PROVENTIL) (2.5 MG/3ML) 0.083% nebulizer solution Take 2.5 mg by nebulization 3 (three) times daily.      Marland Kitchen aspirin 81 MG chewable tablet Chew 81 mg by mouth daily.      . cyclobenzaprine (FLEXERIL) 10 MG tablet Take 10 mg by mouth 2 (two) times daily.        Marland Kitchen dextromethorphan-guaiFENesin (MUCINEX DM) 30-600 MG per 12 hr tablet Take 1 tablet by mouth every 12 (twelve) hours as needed. For congestion      . finasteride (PROSCAR) 5 MG tablet Take 5 mg by mouth daily.      . Fluticasone-Salmeterol (ADVAIR) 250-50 MCG/DOSE AEPB Inhale 1 puff into the lungs every 12 (twelve) hours.        . gabapentin (NEURONTIN) 300 MG capsule Take 300 mg by mouth at bedtime.      Marland Kitchen HYDROcodone-acetaminophen (NORCO) 5-325 MG per tablet Take 1 tablet by mouth every 6 (six) hours as needed. For shingles pain      . Milnacipran (SAVELLA) 50  MG TABS Take 50 mg by mouth 2 (two) times daily.        . roflumilast (DALIRESP) 500 MCG TABS tablet Take 1 tablet (500 mcg total) by mouth daily.  30 tablet  12  . SALINE NASAL MIST NA Place 1-2 sprays into the nose as needed (nasal congestion).      . tadalafil (CIALIS) 5 MG tablet Take 5 mg by mouth daily as needed for erectile dysfunction.       Marland Kitchen tiotropium (SPIRIVA)  18 MCG inhalation capsule Place 18 mcg into inhaler and inhale daily.         Scheduled: . albuterol  2.5 mg Nebulization TID  . aspirin  81 mg Oral Daily  . cyclobenzaprine  10 mg Oral BID  . enoxaparin (LOVENOX) injection  40 mg Subcutaneous Q24H  . finasteride  5 mg Oral Daily  . gabapentin  300 mg Oral QHS  . methylPREDNISolone (SOLU-MEDROL) injection  125 mg Intravenous Q6H  . Milnacipran  50 mg Oral BID  . piperacillin-tazobactam (ZOSYN)  IV  3.375 g Intravenous Q8H  . roflumilast  500 mcg Oral Daily  . sodium chloride  10-40 mL Intracatheter Q12H  . tiotropium  18 mcg Inhalation Daily  . vancomycin  1,000 mg Intravenous Q12H   Continuous: . sodium chloride 50 mL/hr at 11/04/12 0600   JXB:[id number], albuterol, dextromethorphan-guaiFENesin, HYDROcodone-acetaminophen, sodium chloride  Assesment: He has healthcare associated pneumonia is on 3 antibiotics and seems to be improving. He has a PICC line I hope to finish his antibiotic treatments as an outpatient. He has acute exacerbation of COPD. He has hypertension which is pretty well controlled. He has fibromyalgia which is better with the addition of gabapentin. He has acute on chronic respiratory failure stable Principal Problem:   HCAP (healthcare-associated pneumonia) Active Problems:   Acute exacerbation of chronic obstructive pulmonary disease (COPD)   Hypertension   Fibromyalgia   Acute-on-chronic respiratory failure    Plan: Transfer from the step down to a telemetry bed    LOS: 3 days   Ladelle Teodoro L 11/04/2012, 7:56 AM

## 2012-11-05 LAB — BASIC METABOLIC PANEL
BUN: 22 mg/dL (ref 6–23)
CO2: 36 mEq/L — ABNORMAL HIGH (ref 19–32)
Glucose, Bld: 153 mg/dL — ABNORMAL HIGH (ref 70–99)
Potassium: 4.5 mEq/L (ref 3.5–5.1)
Sodium: 137 mEq/L (ref 135–145)

## 2012-11-05 LAB — CULTURE, RESPIRATORY W GRAM STAIN: Culture: NORMAL

## 2012-11-05 MED ORDER — ALUM & MAG HYDROXIDE-SIMETH 200-200-20 MG/5ML PO SUSP
30.0000 mL | Freq: Four times a day (QID) | ORAL | Status: DC | PRN
  Administered 2012-11-05: 30 mL via ORAL
  Filled 2012-11-05: qty 30

## 2012-11-05 NOTE — Progress Notes (Signed)
Accidentally released orders for PICC line. PICC was inserted on 2/24 in Upper arm. Fluids are infusing.

## 2012-11-05 NOTE — Progress Notes (Signed)
Subjective: He is slowly improving. He has a PICC line now. His chest x-ray post PICC line showed that he has what looks like a patchy pneumonia which matches what was seen on his CT. He was more short of breath yesterday and is better today. He is on broad-spectrum antibiotic coverage for healthcare associated pneumonia  Objective: Vital signs in last 24 hours: Temp:  [97.3 F (36.3 C)-97.6 F (36.4 C)] 97.5 F (36.4 C) (02/26 0626) Pulse Rate:  [84-103] 84 (02/26 0626) Resp:  [20-24] 20 (02/26 0626) BP: (116-148)/(71-80) 121/76 mmHg (02/26 0626) SpO2:  [94 %-100 %] 95 % (02/26 0733) Weight change:  Last BM Date: 11/03/12  Intake/Output from previous day: 02/25 0701 - 02/26 0700 In: 2010 [P.O.:960; I.V.:500; IV Piggyback:550] Out: 2250 [Urine:2250]  PHYSICAL EXAM General appearance: alert, cooperative and mild distress Resp: clear to auscultation bilaterally Cardio: regular rate and rhythm, S1, S2 normal, no murmur, click, rub or gallop GI: soft, non-tender; bowel sounds normal; no masses,  no organomegaly Extremities: extremities normal, atraumatic, no cyanosis or edema  Lab Results:    Basic Metabolic Panel:  Recent Labs  09/81/19 0525 11/05/12 0444  NA 139 137  K 4.5 4.5  CL 101 100  CO2 35* 36*  GLUCOSE 139* 153*  BUN 21 22  CREATININE 0.90 0.92  CALCIUM 8.3* 8.3*   Liver Function Tests: No results found for this basename: AST, ALT, ALKPHOS, BILITOT, PROT, ALBUMIN,  in the last 72 hours No results found for this basename: LIPASE, AMYLASE,  in the last 72 hours No results found for this basename: AMMONIA,  in the last 72 hours CBC:  Recent Labs  11/03/12 0433  WBC 20.8*  HGB 10.4*  HCT 33.1*  MCV 90.2  PLT 446*   Cardiac Enzymes: No results found for this basename: CKTOTAL, CKMB, CKMBINDEX, TROPONINI,  in the last 72 hours BNP: No results found for this basename: PROBNP,  in the last 72 hours D-Dimer: No results found for this basename: DDIMER,   in the last 72 hours CBG: No results found for this basename: GLUCAP,  in the last 72 hours Hemoglobin A1C: No results found for this basename: HGBA1C,  in the last 72 hours Fasting Lipid Panel: No results found for this basename: CHOL, HDL, LDLCALC, TRIG, CHOLHDL, LDLDIRECT,  in the last 72 hours Thyroid Function Tests: No results found for this basename: TSH, T4TOTAL, FREET4, T3FREE, THYROIDAB,  in the last 72 hours Anemia Panel: No results found for this basename: VITAMINB12, FOLATE, FERRITIN, TIBC, IRON, RETICCTPCT,  in the last 72 hours Coagulation: No results found for this basename: LABPROT, INR,  in the last 72 hours Urine Drug Screen: Drugs of Abuse  No results found for this basename: labopia, cocainscrnur, labbenz, amphetmu, thcu, labbarb    Alcohol Level: No results found for this basename: ETH,  in the last 72 hours Urinalysis: No results found for this basename: COLORURINE, APPERANCEUR, LABSPEC, PHURINE, GLUCOSEU, HGBUR, BILIRUBINUR, KETONESUR, PROTEINUR, UROBILINOGEN, NITRITE, LEUKOCYTESUR,  in the last 72 hours Misc. Labs:  ABGS  Recent Labs  11/02/12 0850  PHART 7.339*  PO2ART 60.7*  TCO2 31.7  HCO3 33.8*   CULTURES Recent Results (from the past 240 hour(s))  MRSA PCR SCREENING     Status: None   Collection Time    11/01/12  7:10 PM      Result Value Range Status   MRSA by PCR NEGATIVE  NEGATIVE Final   Comment:  The GeneXpert MRSA Assay (FDA     approved for NASAL specimens     only), is one component of a     comprehensive MRSA colonization     surveillance program. It is not     intended to diagnose MRSA     infection nor to guide or     monitor treatment for     MRSA infections.  CULTURE, EXPECTORATED SPUTUM-ASSESSMENT     Status: None   Collection Time    11/02/12  8:25 PM      Result Value Range Status   Specimen Description SPUTUM EXPECTORATED   Final   Special Requests NONE   Final   Sputum evaluation     Final   Value: THIS  SPECIMEN IS ACCEPTABLE. RESPIRATORY CULTURE REPORT TO FOLLOW.     Performed at [facility]   Report Status PENDING   Incomplete  CULTURE, RESPIRATORY (NON-EXPECTORATED)     Status: None   Collection Time    11/02/12  8:25 PM      Result Value Range Status   Specimen Description SPUTUM EXPECTORATED   Final   Special Requests NONE   Final   Gram Stain     Final   Value: RARE WBC PRESENT,BOTH PMN AND MONONUCLEAR     RARE SQUAMOUS EPITHELIAL CELLS PRESENT     NO ORGANISMS SEEN   Culture NORMAL OROPHARYNGEAL FLORA   Final   Report Status 11/05/2012 FINAL   Final   Studies/Results: Dg Chest Port 1 View  11/03/2012  *RADIOLOGY REPORT*  Clinical Data: Confirm PICC line placement  PORTABLE CHEST - 1 VIEW  Comparison: CT chest dated 11/01/2012  Findings: Right arm PICC terminates at the cavoatrial junction.  Chronic interstitial markings/emphysematous changes.  Patchy lingular opacity, worrisome for pneumonia.  No pleural effusion or pneumothorax.  Surgical clips in the right neck.  IMPRESSION: Right arm PICC terminates at the cavoatrial junction.   Original Report Authenticated By: Charline Bills, M.D.     Medications:  Prior to Admission:  Prescriptions prior to admission  Medication Sig Dispense Refill  . acetaminophen (TYLENOL) 500 MG tablet Take 1,000 mg by mouth every 6 (six) hours as needed. For pain       . albuterol (PROVENTIL HFA;VENTOLIN HFA) 108 (90 BASE) MCG/ACT inhaler Inhale 2 puffs into the lungs every 6 (six) hours as needed. For shortness of breath       . albuterol (PROVENTIL) (2.5 MG/3ML) 0.083% nebulizer solution Take 2.5 mg by nebulization 3 (three) times daily.      Marland Kitchen aspirin 81 MG chewable tablet Chew 81 mg by mouth daily.      . cyclobenzaprine (FLEXERIL) 10 MG tablet Take 10 mg by mouth 2 (two) times daily.        Marland Kitchen dextromethorphan-guaiFENesin (MUCINEX DM) 30-600 MG per 12 hr tablet Take 1 tablet by mouth every 12 (twelve) hours as needed. For congestion       . finasteride (PROSCAR) 5 MG tablet Take 5 mg by mouth daily.      . Fluticasone-Salmeterol (ADVAIR) 250-50 MCG/DOSE AEPB Inhale 1 puff into the lungs every 12 (twelve) hours.        . gabapentin (NEURONTIN) 300 MG capsule Take 300 mg by mouth at bedtime.      Marland Kitchen HYDROcodone-acetaminophen (NORCO) 5-325 MG per tablet Take 1 tablet by mouth every 6 (six) hours as needed. For shingles pain      . Milnacipran (SAVELLA) 50 MG TABS Take 50 mg  by mouth 2 (two) times daily.        . roflumilast (DALIRESP) 500 MCG TABS tablet Take 1 tablet (500 mcg total) by mouth daily.  30 tablet  12  . SALINE NASAL MIST NA Place 1-2 sprays into the nose as needed (nasal congestion).      . tadalafil (CIALIS) 5 MG tablet Take 5 mg by mouth daily as needed for erectile dysfunction.       Marland Kitchen tiotropium (SPIRIVA) 18 MCG inhalation capsule Place 18 mcg into inhaler and inhale daily.         Scheduled: . albuterol  2.5 mg Nebulization TID  . aspirin  81 mg Oral Daily  . cyclobenzaprine  10 mg Oral BID  . enoxaparin (LOVENOX) injection  40 mg Subcutaneous Q24H  . finasteride  5 mg Oral Daily  . gabapentin  300 mg Oral QHS  . methylPREDNISolone (SOLU-MEDROL) injection  125 mg Intravenous Q6H  . Milnacipran  50 mg Oral BID  . piperacillin-tazobactam (ZOSYN)  IV  3.375 g Intravenous Q8H  . roflumilast  500 mcg Oral Daily  . sodium chloride  10-40 mL Intracatheter Q12H  . tiotropium  18 mcg Inhalation Daily  . vancomycin  1,000 mg Intravenous Q12H   Continuous: . sodium chloride 50 mL/hr at 11/04/12 0600   VWU:[id number], albuterol, dextromethorphan-guaiFENesin, HYDROcodone-acetaminophen, sodium chloride  Assesment: He has healthcare associated pneumonia. He has COPD which is severe and essentially end-stage. He has acute on chronic respiratory failure and all of this seems to be improving. He has fibromyalgia baseline Principal Problem:   HCAP (healthcare-associated pneumonia) Active Problems:   Acute  exacerbation of chronic obstructive pulmonary disease (COPD)   Hypertension   Fibromyalgia   Acute-on-chronic respiratory failure    Plan: I think she'll probably be able to go home tomorrow. He will need IV antibiotics at home. He will have CBC tomorrow because his last white blood cell count was 20,800    LOS: 4 days   Sarena Jezek L 11/05/2012, 8:43 AM

## 2012-11-05 NOTE — Progress Notes (Signed)
ANTIBIOTIC CONSULT NOTE   Pharmacy Consult for Vancomycin & Zosyn Indication: pneumonia  Allergies  Allergen Reactions  . Morphine And Related Other (See Comments)    Paralysis, "shuts down kidney and bowel tract"    Patient Measurements: Height: 5\' 10"  (177.8 cm) Weight: 151 lb 14.4 oz (68.9 kg) IBW/kg (Calculated) : 73  Vital Signs: Temp: 97.5 F (36.4 C) (02/26 0626) BP: 121/76 mmHg (02/26 0626) Pulse Rate: 84 (02/26 0626) Intake/Output from previous day: 02/25 0701 - 02/26 0700 In: 2010 [P.O.:960; I.V.:500; IV Piggyback:550] Out: 2250 [Urine:2250] Intake/Output from this shift: Total I/O In: 480 [P.O.:480] Out: -   Labs:  Recent Labs  11/03/12 0433 11/04/12 0525 11/05/12 0444  WBC 20.8*  --   --   HGB 10.4*  --   --   PLT 446*  --   --   CREATININE 1.00 0.90 0.92   Estimated Creatinine Clearance: 82.2 ml/min (by C-G formula based on Cr of 0.92).  Recent Labs  11/05/12 0919  VANCOTROUGH 17.9     Microbiology: Recent Results (from the past 720 hour(s))  MRSA PCR SCREENING     Status: None   Collection Time    11/01/12  7:10 PM      Result Value Range Status   MRSA by PCR NEGATIVE  NEGATIVE Final   Comment:            The GeneXpert MRSA Assay (FDA     approved for NASAL specimens     only), is one component of a     comprehensive MRSA colonization     surveillance program. It is not     intended to diagnose MRSA     infection nor to guide or     monitor treatment for     MRSA infections.  CULTURE, EXPECTORATED SPUTUM-ASSESSMENT     Status: None   Collection Time    11/02/12  8:25 PM      Result Value Range Status   Specimen Description SPUTUM EXPECTORATED   Final   Special Requests NONE   Final   Sputum evaluation     Final   Value: THIS SPECIMEN IS ACCEPTABLE. RESPIRATORY CULTURE REPORT TO FOLLOW.     Performed at [facility]   Report Status PENDING   Incomplete  CULTURE, RESPIRATORY (NON-EXPECTORATED)     Status: None   Collection Time    11/02/12  8:25 PM      Result Value Range Status   Specimen Description SPUTUM EXPECTORATED   Final   Special Requests NONE   Final   Gram Stain     Final   Value: RARE WBC PRESENT,BOTH PMN AND MONONUCLEAR     RARE SQUAMOUS EPITHELIAL CELLS PRESENT     NO ORGANISMS SEEN   Culture NORMAL OROPHARYNGEAL FLORA   Final   Report Status 11/05/2012 FINAL   Final   Medical History: Past Medical History  Diagnosis Date  . COPD (chronic obstructive pulmonary disease)   . Hypertension   . Fibromyalgia   . Shingles   . Cancer     kidney   Medications:  Scheduled:  . albuterol  2.5 mg Nebulization TID  . aspirin  81 mg Oral Daily  . cyclobenzaprine  10 mg Oral BID  . enoxaparin (LOVENOX) injection  40 mg Subcutaneous Q24H  . finasteride  5 mg Oral Daily  . gabapentin  300 mg Oral QHS  . methylPREDNISolone (SOLU-MEDROL) injection  125 mg Intravenous Q6H  . Milnacipran  50 mg Oral BID  . piperacillin-tazobactam (ZOSYN)  IV  3.375 g Intravenous Q8H  . roflumilast  500 mcg Oral Daily  . sodium chloride  10-40 mL Intracatheter Q12H  . tiotropium  18 mcg Inhalation Daily  . vancomycin  1,000 mg Intravenous Q12H   Assessment: 62 yo M on day#5 empiric, broad-spectrum antibiotic regimen of Vancomycin & Zosyn for HCAP.   Renal function has been stable.   Vancomycin trough level within goal range today.  All cx data negative.   Goal of Therapy:  Vancomycin trough level 15-20 mcg/ml  Plan:  1) Continue Zosyn 3.375 GM IV every 8 hours (extended interval infusion of 4 hours) 2) Continue Vancomycin 1 GM IV every 12 hours 3) Weekly Vancomycin trough and Scr while on Vancomycin 4) Duration of therapy per MD - recommend 7-10 days per HCAP guidelines 5) If discharge on IV Zosyn will need to change dose to 3.375gm IV Q6h to be infused over 30 min for ease of outpatient administration.    Elson Clan 11/05/2012,10:33 AM

## 2012-11-05 NOTE — Progress Notes (Signed)
ANTICOAGULATION CONSULT NOTE  Pharmacy Consult for Lovenox Indication: VTE prophylaxis  Allergies  Allergen Reactions  . Morphine And Related Other (See Comments)    Paralysis, "shuts down kidney and bowel tract"     Patient Measurements: Height: 5\' 10"  (177.8 cm) Weight: 151 lb 14.4 oz (68.9 kg) IBW/kg (Calculated) : 73 Heparin Dosing Weight: 65.2 kg  Vital Signs: Temp: 97.5 F (36.4 C) (02/26 0626) BP: 121/76 mmHg (02/26 0626) Pulse Rate: 84 (02/26 0626)  Labs:  Recent Labs  11/03/12 0433 11/04/12 0525 11/05/12 0444  HGB 10.4*  --   --   HCT 33.1*  --   --   PLT 446*  --   --   CREATININE 1.00 0.90 0.92    Estimated Creatinine Clearance: 82.2 ml/min (by C-G formula based on Cr of 0.92).   Medical History: Past Medical History  Diagnosis Date  . COPD (chronic obstructive pulmonary disease)   . Hypertension   . Fibromyalgia   . Shingles   . Cancer     kidney    Medications:  Scheduled:  . albuterol  2.5 mg Nebulization TID  . aspirin  81 mg Oral Daily  . cyclobenzaprine  10 mg Oral BID  . enoxaparin (LOVENOX) injection  40 mg Subcutaneous Q24H  . finasteride  5 mg Oral Daily  . gabapentin  300 mg Oral QHS  . methylPREDNISolone (SOLU-MEDROL) injection  125 mg Intravenous Q6H  . Milnacipran  50 mg Oral BID  . piperacillin-tazobactam (ZOSYN)  IV  3.375 g Intravenous Q8H  . roflumilast  500 mcg Oral Daily  . sodium chloride  10-40 mL Intracatheter Q12H  . tiotropium  18 mcg Inhalation Daily  . vancomycin  1,000 mg Intravenous Q12H    Assessment: Patient weight > 45 kg CrCl > 30 ml/min No bleeding noted  Goal of Therapy:  Lovenox DVT prophylaxis dosing   Plan:  Lovenox 40 mg SQ every 24 hours Pharmacy to signed off  Elson Clan 11/05/2012,10:01 AM

## 2012-11-06 ENCOUNTER — Encounter: Payer: Self-pay | Admitting: Gastroenterology

## 2012-11-06 LAB — CBC WITH DIFFERENTIAL/PLATELET
Eosinophils Relative: 0 % (ref 0–5)
HCT: 34.1 % — ABNORMAL LOW (ref 39.0–52.0)
Hemoglobin: 10.8 g/dL — ABNORMAL LOW (ref 13.0–17.0)
Lymphocytes Relative: 3 % — ABNORMAL LOW (ref 12–46)
Lymphs Abs: 0.5 10*3/uL — ABNORMAL LOW (ref 0.7–4.0)
MCH: 28.2 pg (ref 26.0–34.0)
MCV: 89 fL (ref 78.0–100.0)
Monocytes Absolute: 0.2 10*3/uL (ref 0.1–1.0)
Monocytes Relative: 1 % — ABNORMAL LOW (ref 3–12)
RBC: 3.83 MIL/uL — ABNORMAL LOW (ref 4.22–5.81)
WBC: 16.1 10*3/uL — ABNORMAL HIGH (ref 4.0–10.5)

## 2012-11-06 MED ORDER — HEPARIN SOD (PORK) LOCK FLUSH 100 UNIT/ML IV SOLN
250.0000 [IU] | INTRAVENOUS | Status: AC | PRN
  Administered 2012-11-06: 250 [IU]
  Filled 2012-11-06: qty 5

## 2012-11-06 MED ORDER — HEPARIN SOD (PORK) LOCK FLUSH 100 UNIT/ML IV SOLN: 250.0000 [IU] | INTRAVENOUS | Status: DC | PRN

## 2012-11-06 NOTE — Progress Notes (Signed)
Patient received discharge instructions along with follow up appointments. Patient verbalized understanding of all instructions. Patient was escorted by staff via wheelchair to vehicle. Patient discharged to home in stable condition. 

## 2012-11-06 NOTE — Progress Notes (Signed)
Physical Therapy Treatment Patient Details Name: Connor Armstrong MRN: [id number] DOB: 12/15/1950 Today's Date: 11/06/2012 Time: 7829-5621 PT Time Calculation (min): 27 min  PT Assessment / Plan / Recommendation Comments on Treatment Session  Pt is stable in gait with a walker on level and stairs.  He declines having HHPT. At some point, he may wish to pursue cardio-pulmonary rehab program    Follow Up Recommendations        Does the patient have the potential to tolerate intense rehabilitation     Barriers to Discharge        Equipment Recommendations       Recommendations for Other Services    Frequency     Plan All goals met and education completed, patient dischaged from PT services    Precautions / Restrictions     Pertinent Vitals/Pain     Mobility  Transfers Sit to Stand: 7: Independent Ambulation/Gait Ambulation/Gait Assistance: 6: Modified independent (Device/Increase time) Ambulation Distance (Feet): 200 Feet Assistive device: Rolling walker Ambulation/Gait Assistance Details: O2 at 3 L/min with O2 decreasing to 87% after ambulating 100'.  O2 increased to 4L/min for the remainder of gait Gait Pattern: Within Functional Limits General Gait Details: I am recommending that pt continue to use a walker for gait until his respiratory status improves in order to decrease overall exertional level Stairs: Yes Stairs Assistance: 6: Modified independent (Device/Increase time) Stair Management Technique: Two rails;Step to pattern;Forwards Number of Stairs: 4 Wheelchair Mobility Wheelchair Mobility: No    Exercises General Exercises - Lower Extremity Heel Slides: AROM;Both;10 reps;Supine Straight Leg Raises: AROM;Both;10 reps;Supine   PT Diagnosis:    PT Problem List:   PT Treatment Interventions:     PT Goals Acute Rehab PT Goals PT Goal: Ambulate - Progress: Met PT Goal: Up/Down Stairs - Progress: Met  Visit Information  Last PT Received On: 11/06/12     Subjective Data  Subjective: I would really like to have PT again before I go home   Cognition       Balance     End of Session PT - End of Session Equipment Utilized During Treatment: Gait belt Activity Tolerance: Patient tolerated treatment well Patient left: in chair;with call bell/phone within reach   GP     Myrlene Broker L 11/06/2012, 12:29 PM

## 2012-11-06 NOTE — Progress Notes (Signed)
He feels much better. He has no new complaints. His breathing is doing well. He is ready for discharge. He will be discharged home today please see discharge summary for details

## 2012-11-07 NOTE — Discharge Summary (Signed)
Physician Discharge Summary  Patient ID: Connor Armstrong MRN: [id number] DOB/AGE: April 23, 1951 62 y.o. Primary Care Physician:Tanina Barb L, MD Admit date: 11/01/2012 Discharge date: 11/07/2012    Discharge Diagnoses:   Principal Problem:   HCAP (healthcare-associated pneumonia) Active Problems:   Acute exacerbation of chronic obstructive pulmonary disease (COPD)   Hypertension   Fibromyalgia   Acute-on-chronic respiratory failure     Medication List    TAKE these medications       acetaminophen 500 MG tablet  Commonly known as:  TYLENOL  Take 1,000 mg by mouth every 6 (six) hours as needed. For pain     albuterol 108 (90 BASE) MCG/ACT inhaler  Commonly known as:  PROVENTIL HFA;VENTOLIN HFA  Inhale 2 puffs into the lungs every 6 (six) hours as needed. For shortness of breath     albuterol (2.5 MG/3ML) 0.083% nebulizer solution  Commonly known as:  PROVENTIL  Take 2.5 mg by nebulization 3 (three) times daily.     aspirin 81 MG chewable tablet  Chew 81 mg by mouth daily.     cyclobenzaprine 10 MG tablet  Commonly known as:  FLEXERIL  Take 10 mg by mouth 2 (two) times daily.     dextromethorphan-guaiFENesin 30-600 MG per 12 hr tablet  Commonly known as:  MUCINEX DM  Take 1 tablet by mouth every 12 (twelve) hours as needed. For congestion     finasteride 5 MG tablet  Commonly known as:  PROSCAR  Take 5 mg by mouth daily.     Fluticasone-Salmeterol 250-50 MCG/DOSE Aepb  Commonly known as:  ADVAIR  Inhale 1 puff into the lungs every 12 (twelve) hours.     gabapentin 300 MG capsule  Commonly known as:  NEURONTIN  Take 300 mg by mouth at bedtime.     HYDROcodone-acetaminophen 5-325 MG per tablet  Commonly known as:  NORCO/VICODIN  Take 1 tablet by mouth every 6 (six) hours as needed. For shingles pain     Milnacipran 50 MG Tabs  Commonly known as:  SAVELLA  Take 50 mg by mouth 2 (two) times daily.     roflumilast 500 MCG Tabs tablet  Commonly known as:   DALIRESP  Take 1 tablet (500 mcg total) by mouth daily.     SALINE NASAL MIST NA  Place 1-2 sprays into the nose as needed (nasal congestion).     tadalafil 5 MG tablet  Commonly known as:  CIALIS  Take 5 mg by mouth daily as needed for erectile dysfunction.     tiotropium 18 MCG inhalation capsule  Commonly known as:  SPIRIVA  Place 18 mcg into inhaler and inhale daily.        Discharged Condition: Improved    Consults: None  Significant Diagnostic Studies: Ct Angio Chest Pe W/cm &/or Wo Cm  11/01/2012  *RADIOLOGY REPORT*  Clinical Data: Shortness of breath.  Hypoxia.  CT ANGIOGRAPHY CHEST  Technique:  Multidetector CT imaging of the chest using the standard protocol during bolus administration of intravenous contrast. Multiplanar reconstructed images including MIPs were obtained and reviewed to evaluate the vascular anatomy.  Contrast: OMNIPAQUE IOHEXOL 350 MG/ML SOLN  Comparison: Chest CT 05/24/2004.  Findings:  Mediastinum: No filling defects within the pulmonary arterial tree to suggest underlying pulmonary embolism. Heart size is normal. A small amount of pericardial fluid and/or thickening, unlikely to be of hemodynamic significance at this time.  No associated pericardial calcification.  There are numerous borderline enlarged mediastinal and bilateral hilar lymph nodes,  largest of which measures up to 9 mm in short axis in the AP window.  These are presumably reactive.  Postoperative changes of a right hemithyroidectomy.  The esophagus is unremarkable in appearance.  Lungs/Pleura: There is severe centrilobular and paraseptal emphysema, with numerous bilateral bullae and blebs, most pronounced at the apices of the lungs bilaterally.  There are numerous nodular masslike airspace opacities in the lungs bilaterally, particularly in the periphery of the left upper lobe. These are favored to be infectious, however, some of them are somewhat solid in appearance and warrant close  attention on follow- up studies, including a 13 mm nodule with spiculated margins in the left upper lobe (image 66 of series 5) and a 9 mm nodule with spiculated margins in the medial segment of the right middle lobe (image 61 of series 5).  Mild diffuse bronchial wall thickening. No pleural effusions.  Upper Abdomen: Surgical clips in the upper right retroperitoneum.  Musculoskeletal: There are no aggressive appearing lytic or blastic lesions noted in the visualized portions of the skeleton.  IMPRESSION: 1.  No evidence of pulmonary embolism. 2.  Multiple nodular and mass like areas of air space opacification throughout the lungs bilaterally.  The overall appearance is favored to represent a multi focal atypical infectious process, potentially even fungal pneumonia.  Clinical correlation is recommended.  The possibility of neoplasm is not excluded, and accordingly, close attention on short-term repeat chest CT in 3 months is recommended. 3.  Diffuse bronchial wall thickening with severe centrilobular paraseptal emphysema; imaging findings compatible with advanced COPD. 4.  Small amount of anterior pericardial fluid and/or thickening, unlikely to be of hemodynamic significance at this time.   Original Report Authenticated By: Trudie Reed, M.D.    Dg Chest Port 1 View  11/03/2012  *RADIOLOGY REPORT*  Clinical Data: Confirm PICC line placement  PORTABLE CHEST - 1 VIEW  Comparison: CT chest dated 11/01/2012  Findings: Right arm PICC terminates at the cavoatrial junction.  Chronic interstitial markings/emphysematous changes.  Patchy lingular opacity, worrisome for pneumonia.  No pleural effusion or pneumothorax.  Surgical clips in the right neck.  IMPRESSION: Right arm PICC terminates at the cavoatrial junction.   Original Report Authenticated By: Charline Bills, M.D.    Dg Chest Portable 1 View  11/01/2012  *RADIOLOGY REPORT*  Clinical Data: Shortness of breath.  PORTABLE CHEST - 1 VIEW  Comparison:  09/25/2012.  Findings: The cardiac silhouette, mediastinal and hilar contours are stable.  Stable underlying emphysematous changes with apical bulla.  Suspect asymmetric perihilar pulmonary edema.  No pleural effusion.  IMPRESSION: Severe chronic emphysematous changes with suspected overlying asymmetric perihilar edema.   Original Report Authenticated By: Rudie Meyer, M.D.     Lab Results: Basic Metabolic Panel:  Recent Labs  24/40/10 0444  NA 137  K 4.5  CL 100  CO2 36*  GLUCOSE 153*  BUN 22  CREATININE 0.92  CALCIUM 8.3*   Liver Function Tests: No results found for this basename: AST, ALT, ALKPHOS, BILITOT, PROT, ALBUMIN,  in the last 72 hours   CBC:  Recent Labs  11/06/12 0339  WBC 16.1*  NEUTROABS 15.4*  HGB 10.8*  HCT 34.1*  MCV 89.0  PLT 403*    Recent Results (from the past 240 hour(s))  MRSA PCR SCREENING     Status: None   Collection Time    11/01/12  7:10 PM      Result Value Range Status   MRSA by PCR NEGATIVE  NEGATIVE Final  Comment:            The GeneXpert MRSA Assay (FDA     approved for NASAL specimens     only), is one component of a     comprehensive MRSA colonization     surveillance program. It is not     intended to diagnose MRSA     infection nor to guide or     monitor treatment for     MRSA infections.  CULTURE, EXPECTORATED SPUTUM-ASSESSMENT     Status: None   Collection Time    11/02/12  8:25 PM      Result Value Range Status   Specimen Description SPUTUM EXPECTORATED   Final   Special Requests NONE   Final   Sputum evaluation     Final   Value: THIS SPECIMEN IS ACCEPTABLE. RESPIRATORY CULTURE REPORT TO FOLLOW.     Performed at [facility]   Report Status PENDING   Incomplete  CULTURE, RESPIRATORY (NON-EXPECTORATED)     Status: None   Collection Time    11/02/12  8:25 PM      Result Value Range Status   Specimen Description SPUTUM EXPECTORATED   Final   Special Requests NONE   Final   Gram Stain     Final    Value: RARE WBC PRESENT,BOTH PMN AND MONONUCLEAR     RARE SQUAMOUS EPITHELIAL CELLS PRESENT     NO ORGANISMS SEEN   Culture NORMAL OROPHARYNGEAL FLORA   Final   Report Status 11/05/2012 FINAL   Final     Hospital Course: He was admitted with healthcare associated pneumonia. This looks somewhat atypical based on x-ray appearance. He was started on 3 antibiotics steroids inhaled bronchodilators etc. He improved fairly rapidly. He had PICC line placed so he can finish his antibiotics as an outpatient. At the time of discharge she was back at baseline.  Discharge Exam: Blood pressure 143/75, pulse 91, temperature 97.7 F (36.5 C), temperature source Oral, resp. rate 18, height 5\' 10"  (1.778 m), weight 68.9 kg (151 lb 14.4 oz), SpO2 99.00%. He is awake and alert. He is somewhat short of breath with exertion which is chronic. He is wearing oxygen. His chest shows decreased breath sounds but is clear  Disposition: Home with home health services to finish IV antibiotics at home      Discharge Orders   Future Appointments Provider Department Dept Phone   11/10/2012 1:30 PM Joselyn Arrow, NP [facility] [phone]   Future Orders Complete By Expires     Discharge patient  As directed     Face-to-face encounter  As directed     Comments:      I Amiera Herzberg L certify that this patient is under my care and that I, or a nurse practitioner or physician's assistant working with me, had a face-to-face encounter that meets the physician face-to-face encounter requirements with this patient on 11/06/2012. The encounter with the patient was in whole, or in part for the following medical condition(s) which is the primary reason for home health care (List medical condition): Healthcare associated pneumonia    Questions:      The encounter with the patient was in whole, or in part, for the following medical condition, which is the primary reason for home health care:  Healthcare  associated pneumonia    I certify that, based on my findings, the following services are medically necessary home health services:  Nursing    Physical therapy  My clinical findings support the need for the above services:  Shortness of breath with activity    Further, I certify that my clinical findings support that this patient is homebound due to:  Shortness of Breath with activity    To provide the following care/treatments:  PT    RN    Home Health  As directed     Scheduling Instructions:      He needs Zosyn 3.375 g IV every 8 hours, Levaquin 500 mg IV daily and vancomycin 1000 mg IV every 24 hours. He should be on a H. Cipro to laboratory work and adjustments of doses by pharmacy. He should be on all 3 of these for 7 more days.    Questions:      To provide the following care/treatments:  PT    RN       Follow-up Information   Follow up with [facility].   Contact information:   [address] [place] [address] [phone]      Signed: Fredirick Maudlin Pager [phone]  11/07/2012, 7:48 AM

## 2012-11-08 DIAGNOSIS — B3781 Candidal esophagitis: Secondary | ICD-10-CM

## 2012-11-08 DIAGNOSIS — B9681 Helicobacter pylori [H. pylori] as the cause of diseases classified elsewhere: Secondary | ICD-10-CM

## 2012-11-08 HISTORY — DX: Helicobacter pylori (H. pylori) as the cause of diseases classified elsewhere: B96.81

## 2012-11-08 HISTORY — DX: Candidal esophagitis: B37.81

## 2012-11-10 ENCOUNTER — Ambulatory Visit: Payer: Medicare Other | Admitting: Urgent Care

## 2012-11-15 ENCOUNTER — Encounter (HOSPITAL_COMMUNITY): Payer: Self-pay | Admitting: Emergency Medicine

## 2012-11-15 ENCOUNTER — Emergency Department (HOSPITAL_COMMUNITY): Payer: Medicare Other

## 2012-11-15 ENCOUNTER — Inpatient Hospital Stay (HOSPITAL_COMMUNITY)
Admission: EM | Admit: 2012-11-15 | Discharge: 2012-11-21 | DRG: 189 | Disposition: A | Discharge status: Home Health | Payer: Medicare Other | Attending: Pulmonary Disease | Admitting: Pulmonary Disease

## 2012-11-15 DIAGNOSIS — J9620 Acute and chronic respiratory failure, unspecified whether with hypoxia or hypercapnia: Principal | ICD-10-CM | POA: Diagnosis present

## 2012-11-15 DIAGNOSIS — IMO0001 Reserved for inherently not codable concepts without codable children: Secondary | ICD-10-CM | POA: Diagnosis present

## 2012-11-15 DIAGNOSIS — I498 Other specified cardiac arrhythmias: Secondary | ICD-10-CM | POA: Diagnosis present

## 2012-11-15 DIAGNOSIS — K2960 Other gastritis without bleeding: Secondary | ICD-10-CM | POA: Diagnosis present

## 2012-11-15 DIAGNOSIS — D62 Acute posthemorrhagic anemia: Secondary | ICD-10-CM | POA: Diagnosis present

## 2012-11-15 DIAGNOSIS — B3781 Candidal esophagitis: Secondary | ICD-10-CM | POA: Diagnosis present

## 2012-11-15 DIAGNOSIS — R404 Transient alteration of awareness: Secondary | ICD-10-CM | POA: Diagnosis present

## 2012-11-15 DIAGNOSIS — M797 Fibromyalgia: Secondary | ICD-10-CM | POA: Diagnosis present

## 2012-11-15 DIAGNOSIS — Z85528 Personal history of other malignant neoplasm of kidney: Secondary | ICD-10-CM

## 2012-11-15 DIAGNOSIS — Z87891 Personal history of nicotine dependence: Secondary | ICD-10-CM

## 2012-11-15 DIAGNOSIS — I10 Essential (primary) hypertension: Secondary | ICD-10-CM | POA: Diagnosis present

## 2012-11-15 DIAGNOSIS — R911 Solitary pulmonary nodule: Secondary | ICD-10-CM | POA: Diagnosis present

## 2012-11-15 DIAGNOSIS — J441 Chronic obstructive pulmonary disease with (acute) exacerbation: Secondary | ICD-10-CM | POA: Diagnosis present

## 2012-11-15 DIAGNOSIS — K449 Diaphragmatic hernia without obstruction or gangrene: Secondary | ICD-10-CM | POA: Diagnosis present

## 2012-11-15 DIAGNOSIS — Z79899 Other long term (current) drug therapy: Secondary | ICD-10-CM

## 2012-11-15 DIAGNOSIS — D500 Iron deficiency anemia secondary to blood loss (chronic): Secondary | ICD-10-CM | POA: Diagnosis present

## 2012-11-15 MED ORDER — METHYLPREDNISOLONE SODIUM SUCC 125 MG IJ SOLR
125.0000 mg | Freq: Once | INTRAMUSCULAR | Status: AC
  Administered 2012-11-16: 125 mg via INTRAVENOUS
  Filled 2012-11-15: qty 2

## 2012-11-15 MED ORDER — ALBUTEROL SULFATE (5 MG/ML) 0.5% IN NEBU
2.5000 mg | INHALATION_SOLUTION | Freq: Once | RESPIRATORY_TRACT | Status: AC
  Administered 2012-11-16: 2.5 mg via RESPIRATORY_TRACT
  Filled 2012-11-15: qty 0.5

## 2012-11-15 NOTE — ED Notes (Addendum)
EMS called out for shortness of breath and unresponsive. Per wife patient was wearing CPAP at their home as he normally does at night. Wife went to check on patient, and she stated patient would not wake up and his oxygen saturation was reading 81%. On EMS arrival, patient was alert and responsive on NRB with oxygen saturation of 100%. Patient normally wears 2-3 liters of oxygen via nasal canula and CPAP at night. Patient complaining of shortness of breath and bilateral leg pain.

## 2012-11-16 LAB — URINALYSIS, ROUTINE W REFLEX MICROSCOPIC
Bilirubin Urine: NEGATIVE
Hgb urine dipstick: NEGATIVE
Ketones, ur: NEGATIVE mg/dL
Nitrite: NEGATIVE
pH: 6.5 (ref 5.0–8.0)

## 2012-11-16 LAB — CBC WITH DIFFERENTIAL/PLATELET
Basophils Absolute: 0 10*3/uL (ref 0.0–0.1)
Eosinophils Relative: 8 % — ABNORMAL HIGH (ref 0–5)
HCT: 27.5 % — ABNORMAL LOW (ref 39.0–52.0)
Lymphocytes Relative: 11 % — ABNORMAL LOW (ref 12–46)
Lymphs Abs: 1.2 10*3/uL (ref 0.7–4.0)
MCV: 91.1 fL (ref 78.0–100.0)
Monocytes Absolute: 1.3 10*3/uL — ABNORMAL HIGH (ref 0.1–1.0)
Neutro Abs: 7.1 10*3/uL (ref 1.7–7.7)
Platelets: 219 10*3/uL (ref 150–400)
RBC: 3.02 MIL/uL — ABNORMAL LOW (ref 4.22–5.81)
RDW: 15.8 % — ABNORMAL HIGH (ref 11.5–15.5)
WBC: 10.4 10*3/uL (ref 4.0–10.5)

## 2012-11-16 LAB — BASIC METABOLIC PANEL
CO2: 40 mEq/L (ref 19–32)
Calcium: 8.9 mg/dL (ref 8.4–10.5)
Chloride: 95 mEq/L — ABNORMAL LOW (ref 96–112)
Glucose, Bld: 132 mg/dL — ABNORMAL HIGH (ref 70–99)
Sodium: 137 mEq/L (ref 135–145)

## 2012-11-16 LAB — BLOOD GAS, ARTERIAL
Bicarbonate: 38.4 mEq/L — ABNORMAL HIGH (ref 20.0–24.0)
TCO2: 36.5 mmol/L (ref 0–100)
pCO2 arterial: 78.2 mmHg (ref 35.0–45.0)
pH, Arterial: 7.312 — ABNORMAL LOW (ref 7.350–7.450)

## 2012-11-16 LAB — HEPATIC FUNCTION PANEL
ALT: 29 U/L (ref 0–53)
AST: 16 U/L (ref 0–37)
Bilirubin, Direct: <0.1 mg/dL (ref 0.0–0.3)
Total Protein: 6.3 g/dL (ref 6.0–8.3)

## 2012-11-16 LAB — TROPONIN I: Troponin I: <0.3 ng/mL (ref <0.30)

## 2012-11-16 LAB — MRSA PCR SCREENING: MRSA by PCR: NEGATIVE

## 2012-11-16 LAB — LACTIC ACID, PLASMA: Lactic Acid, Venous: 0.9 mmol/L (ref 0.5–2.2)

## 2012-11-16 MED ORDER — ACETAMINOPHEN 650 MG RE SUPP: 650.0000 mg | Freq: Four times a day (QID) | RECTAL | Status: DC | PRN

## 2012-11-16 MED ORDER — VANCOMYCIN HCL IN DEXTROSE 1-5 GM/200ML-% IV SOLN
1000.0000 mg | Freq: Once | INTRAVENOUS | Status: AC
  Administered 2012-11-16: 1000 mg via INTRAVENOUS
  Filled 2012-11-16: qty 200

## 2012-11-16 MED ORDER — ASPIRIN 81 MG PO CHEW
81.0000 mg | CHEWABLE_TABLET | Freq: Every day | ORAL | Status: DC
  Administered 2012-11-16 – 2012-11-20 (×5): 81 mg via ORAL
  Filled 2012-11-16 (×5): qty 1

## 2012-11-16 MED ORDER — ROFLUMILAST 500 MCG PO TABS
500.0000 ug | ORAL_TABLET | Freq: Every day | ORAL | Status: DC
  Administered 2012-11-16 – 2012-11-20 (×5): 500 ug via ORAL
  Filled 2012-11-16 (×14): qty 1

## 2012-11-16 MED ORDER — FINASTERIDE 5 MG PO TABS
5.0000 mg | ORAL_TABLET | Freq: Every day | ORAL | Status: DC
  Administered 2012-11-16 – 2012-11-21 (×6): 5 mg via ORAL
  Filled 2012-11-16 (×11): qty 1

## 2012-11-16 MED ORDER — METHYLPREDNISOLONE SODIUM SUCC 125 MG IJ SOLR
125.0000 mg | Freq: Four times a day (QID) | INTRAMUSCULAR | Status: DC
  Administered 2012-11-16 – 2012-11-19 (×13): 125 mg via INTRAVENOUS
  Filled 2012-11-16 (×13): qty 2

## 2012-11-16 MED ORDER — POTASSIUM CHLORIDE IN NACL 20-0.9 MEQ/L-% IV SOLN
INTRAVENOUS | Status: DC
  Administered 2012-11-16 – 2012-11-20 (×7): via INTRAVENOUS

## 2012-11-16 MED ORDER — IPRATROPIUM BROMIDE 0.02 % IN SOLN
0.5000 mg | RESPIRATORY_TRACT | Status: DC
  Administered 2012-11-16 – 2012-11-21 (×24): 0.5 mg via RESPIRATORY_TRACT
  Filled 2012-11-16 (×24): qty 2.5

## 2012-11-16 MED ORDER — ALBUTEROL SULFATE (5 MG/ML) 0.5% IN NEBU
2.5000 mg | INHALATION_SOLUTION | RESPIRATORY_TRACT | Status: DC | PRN
  Administered 2012-11-16: 2.5 mg via RESPIRATORY_TRACT
  Filled 2012-11-16: qty 0.5

## 2012-11-16 MED ORDER — FLEET ENEMA 7-19 GM/118ML RE ENEM: 1.0000 | ENEMA | Freq: Once | RECTAL | Status: AC | PRN

## 2012-11-16 MED ORDER — SORBITOL 70 % SOLN: 30.0000 mL | Freq: Every day | Status: DC | PRN

## 2012-11-16 MED ORDER — ALBUTEROL SULFATE (5 MG/ML) 0.5% IN NEBU: 2.5000 mg | INHALATION_SOLUTION | RESPIRATORY_TRACT | Status: DC | PRN

## 2012-11-16 MED ORDER — ENOXAPARIN SODIUM 40 MG/0.4ML ~~LOC~~ SOLN
40.0000 mg | SUBCUTANEOUS | Status: DC
  Administered 2012-11-16 – 2012-11-20 (×5): 40 mg via SUBCUTANEOUS
  Filled 2012-11-16 (×5): qty 0.4

## 2012-11-16 MED ORDER — HYDROCODONE-ACETAMINOPHEN 5-325 MG PO TABS
1.0000 | ORAL_TABLET | Freq: Four times a day (QID) | ORAL | Status: DC | PRN
  Administered 2012-11-21: 1 via ORAL
  Filled 2012-11-16: qty 1

## 2012-11-16 MED ORDER — ACETAMINOPHEN 325 MG PO TABS
650.0000 mg | ORAL_TABLET | Freq: Four times a day (QID) | ORAL | Status: DC | PRN
  Filled 2012-11-16: qty 2

## 2012-11-16 MED ORDER — MILNACIPRAN HCL 50 MG PO TABS
50.0000 mg | ORAL_TABLET | Freq: Two times a day (BID) | ORAL | Status: DC
  Administered 2012-11-16 – 2012-11-21 (×10): 50 mg via ORAL
  Filled 2012-11-16 (×20): qty 1

## 2012-11-16 MED ORDER — GABAPENTIN 300 MG PO CAPS
300.0000 mg | ORAL_CAPSULE | Freq: Every day | ORAL | Status: DC
  Administered 2012-11-16 – 2012-11-20 (×5): 300 mg via ORAL
  Filled 2012-11-16 (×5): qty 1

## 2012-11-16 MED ORDER — PIPERACILLIN-TAZOBACTAM 3.375 G IVPB
3.3750 g | Freq: Once | INTRAVENOUS | Status: AC
  Administered 2012-11-16: 3.375 g via INTRAVENOUS
  Filled 2012-11-16: qty 50

## 2012-11-16 MED ORDER — ALBUTEROL SULFATE (5 MG/ML) 0.5% IN NEBU
2.5000 mg | INHALATION_SOLUTION | RESPIRATORY_TRACT | Status: DC
  Administered 2012-11-16 – 2012-11-21 (×24): 2.5 mg via RESPIRATORY_TRACT
  Filled 2012-11-16 (×24): qty 0.5

## 2012-11-16 MED ORDER — ONDANSETRON HCL 4 MG/2ML IJ SOLN: 4.0000 mg | Freq: Four times a day (QID) | INTRAMUSCULAR | Status: DC | PRN

## 2012-11-16 MED ORDER — GUAIFENESIN ER 600 MG PO TB12
1200.0000 mg | ORAL_TABLET | Freq: Two times a day (BID) | ORAL | Status: DC
  Administered 2012-11-16 – 2012-11-21 (×11): 1200 mg via ORAL
  Filled 2012-11-16 (×11): qty 2

## 2012-11-16 MED ORDER — MOMETASONE FURO-FORMOTEROL FUM 100-5 MCG/ACT IN AERO
2.0000 | INHALATION_SPRAY | Freq: Two times a day (BID) | RESPIRATORY_TRACT | Status: DC
  Administered 2012-11-16 – 2012-11-21 (×11): 2 via RESPIRATORY_TRACT
  Filled 2012-11-16: qty 8.8

## 2012-11-16 MED ORDER — PIPERACILLIN-TAZOBACTAM 3.375 G IVPB
INTRAVENOUS | Status: AC
  Filled 2012-11-16: qty 50

## 2012-11-16 MED ORDER — ONDANSETRON HCL 4 MG PO TABS: 4.0000 mg | ORAL_TABLET | Freq: Four times a day (QID) | ORAL | Status: DC | PRN

## 2012-11-16 MED ORDER — SODIUM CHLORIDE 0.9 % IN NEBU
INHALATION_SOLUTION | RESPIRATORY_TRACT | Status: AC
  Administered 2012-11-16: 2.5 mL
  Filled 2012-11-16: qty 3

## 2012-11-16 NOTE — ED Notes (Addendum)
Lab called critical c02 of 40. Dr Dierdre Highman notified verbally in person. Notified by Jordan Likes RN

## 2012-11-16 NOTE — ED Notes (Signed)
Patient recently taken off of Vancomycin, Zosyn, and Levaquin on Tuesday. Patient had been receiving medications via picc line that was removed on 11/14/12.

## 2012-11-16 NOTE — ED Provider Notes (Signed)
History  This chart was scribed for Sunnie Nielsen, MD by Erskine Emery, ED Scribe. This patient was seen in room APA06/[facility]06 and the patient's care was started at 23:35.   CSN: [id number]  Arrival date & time 11/15/12  2335   None     Chief Complaint  Patient presents with  . Shortness of Breath  . Leg Pain    (Consider location/radiation/quality/duration/timing/severity/associated sxs/prior treatment) The history is provided by the patient and the EMS personnel. No language interpreter was used.  Connor Armstrong is a 63 y.o. male brought in by ambulance, who presents to the Emergency Department complaining of gradually worsening mod to severe hypoxia since last night. Pt's wife called EMS because he was 85% on his CPAP machine and he was unresponsive. Pt is on 2L O2 at home 24/7. He just finished a round of antibiotics today for a recent pneumonia for which he was admitted to the hospital. He was taking levaquin, zosyn, and vancomycin through a PICC line that was removed today after his last dose. EMS report the fire department put the pt on 15L O2 upon their arrival and the pt has been at 100% oxygen saturation on O2 since. Pt was given albuterol and a duo neb en route. His blood pressure was 109/65 and tends to run low. Pt reports some associated fevers, of 100.3 last night. Pt has been tended to by a home health nurse since his discharge from the hospital. Pt has a h/o fibromyalgia and HTN but no h/o DM. Symptoms mod to severe, worse with exertion, constant and worsening.   Dr. Juanetta Gosling is the pt's PCP.  Past Medical History  Diagnosis Date  . COPD (chronic obstructive pulmonary disease)   . Hypertension   . Fibromyalgia   . Shingles   . Cancer     kidney    Past Surgical History  Procedure Laterality Date  . Hernia repair    . Thyroid surgery    . Parathyroid surgery    . Kidney surgery    . Flexible sigmoidoscopy  09/27/2012    WGN:FAOZHYQMVHQION/[id number]    Family  History  Problem Relation Age of Onset  . Hypertension Mother     History  Substance Use Topics  . Smoking status: Former Smoker -- 1.00 packs/day    Quit date: 09/20/2012  . Smokeless tobacco: Not on file  . Alcohol Use: No      Review of Systems A complete 10 system review of systems was obtained and all systems are negative except as noted in the HPI and PMH.    Allergies  Morphine and related  Home Medications   Current Outpatient Rx  Name  Route  Sig  Dispense  Refill  . acetaminophen (TYLENOL) 500 MG tablet   Oral   Take 1,000 mg by mouth every 6 (six) hours as needed. For pain          . albuterol (PROVENTIL HFA;VENTOLIN HFA) 108 (90 BASE) MCG/ACT inhaler   Inhalation   Inhale 2 puffs into the lungs every 6 (six) hours as needed. For shortness of breath          . albuterol (PROVENTIL) (2.5 MG/3ML) 0.083% nebulizer solution   Nebulization   Take 2.5 mg by nebulization 3 (three) times daily.         Marland Kitchen aspirin 81 MG chewable tablet   Oral   Chew 81 mg by mouth daily.         . cyclobenzaprine (FLEXERIL) 10  MG tablet   Oral   Take 10 mg by mouth 2 (two) times daily.           Marland Kitchen dextromethorphan-guaiFENesin (MUCINEX DM) 30-600 MG per 12 hr tablet   Oral   Take 1 tablet by mouth every 12 (twelve) hours as needed. For congestion         . finasteride (PROSCAR) 5 MG tablet   Oral   Take 5 mg by mouth daily.         . Fluticasone-Salmeterol (ADVAIR) 250-50 MCG/DOSE AEPB   Inhalation   Inhale 1 puff into the lungs every 12 (twelve) hours.           . gabapentin (NEURONTIN) 300 MG capsule   Oral   Take 300 mg by mouth at bedtime.         Marland Kitchen HYDROcodone-acetaminophen (NORCO) 5-325 MG per tablet   Oral   Take 1 tablet by mouth every 6 (six) hours as needed. For shingles pain         . Milnacipran (SAVELLA) 50 MG TABS   Oral   Take 50 mg by mouth 2 (two) times daily.           . roflumilast (DALIRESP) 500 MCG TABS tablet   Oral    Take 1 tablet (500 mcg total) by mouth daily.   30 tablet   12   . SALINE NASAL MIST NA   Nasal   Place 1-2 sprays into the nose as needed (nasal congestion).         . tadalafil (CIALIS) 5 MG tablet   Oral   Take 5 mg by mouth daily as needed for erectile dysfunction.          Marland Kitchen tiotropium (SPIRIVA) 18 MCG inhalation capsule   Inhalation   Place 18 mcg into inhaler and inhale daily.             BP 113/45  Pulse 112  Temp(Src) 98.2 F (36.8 C) (Oral)  Resp 26  Ht 5\' 10"  (1.778 m)  Wt 140 lb (63.504 kg)  BMI 20.09 kg/m2  SpO2 99%  Physical Exam  Nursing note and vitals reviewed. Constitutional: He is oriented to person, place, and time. He appears well-developed and well-nourished. No distress.  HENT:  Head: Normocephalic and atraumatic.  Mildly dry mucus membranes.   Eyes: EOM are normal. Pupils are equal, round, and reactive to light.  Neck: Neck supple. No tracheal deviation present.  Cardiovascular: Regular rhythm.  Tachycardia present.   Mild tachycardia  Pulmonary/Chest: Effort normal. No respiratory distress.  Expratory wheezes bilaterally.  Abdominal: Soft. He exhibits no distension.  Abdomen is soft and nontender.  Musculoskeletal: Normal range of motion. He exhibits no edema.  No edema. No leg tenderness.  Neurological: He is alert and oriented to person, place, and time.  Skin: Skin is warm and dry.  Psychiatric: He has a normal mood and affect.    ED Course  Procedures (including critical care time) DIAGNOSTIC STUDIES: Oxygen Saturation is 99% on room air, normal by my interpretation.    COORDINATION OF CARE: 23:35--I evaluated the patient and we discussed a treatment plan including chest x-ray, blood work, EKG, and albuterol nebulizer to which the pt agreed.    Results for orders placed during the hospital encounter of 11/15/12  BLOOD GAS, ARTERIAL      Result Value Range   FIO2 0.21     O2 Content 3.0     Delivery systems NASAL CANNULA  pH, Arterial 7.312 (*) 7.350 - 7.450   pCO2 arterial 78.2 (*) 35.0 - 45.0 mmHg   pO2, Arterial 75.3 (*) 80.0 - 100.0 mmHg   Bicarbonate 38.4 (*) 20.0 - 24.0 mEq/L   TCO2 36.5  0 - 100 mmol/L   Acid-Base Excess 11.9 (*) 0.0 - 2.0 mmol/L   O2 Saturation 94.3     Collection site BRACHIAL ARTERY     Drawn by COLLECTED BY RT     Sample type ARTERIAL     Allens test (pass/fail) NOT INDICATED (*) PASS  CBC WITH DIFFERENTIAL      Result Value Range   WBC 10.4  4.0 - 10.5 K/uL   RBC 3.02 (*) 4.22 - 5.81 MIL/uL   Hemoglobin 8.6 (*) 13.0 - 17.0 g/dL   HCT 40.9 (*) 81.1 - 91.4 %   MCV 91.1  78.0 - 100.0 fL   MCH 28.5  26.0 - 34.0 pg   MCHC 31.3  30.0 - 36.0 g/dL   RDW 78.2 (*) 95.6 - 21.3 %   Platelets 219  150 - 400 K/uL   Neutrophils Relative 68  43 - 77 %   Neutro Abs 7.1  1.7 - 7.7 K/uL   Lymphocytes Relative 11 (*) 12 - 46 %   Lymphs Abs 1.2  0.7 - 4.0 K/uL   Monocytes Relative 13 (*) 3 - 12 %   Monocytes Absolute 1.3 (*) 0.1 - 1.0 K/uL   Eosinophils Relative 8 (*) 0 - 5 %   Eosinophils Absolute 0.9 (*) 0.0 - 0.7 K/uL   Basophils Relative 0  0 - 1 %   Basophils Absolute 0.0  0.0 - 0.1 K/uL  BASIC METABOLIC PANEL      Result Value Range   Sodium 137  135 - 145 mEq/L   Potassium 3.6  3.5 - 5.1 mEq/L   Chloride 95 (*) 96 - 112 mEq/L   CO2 40 (*) 19 - 32 mEq/L   Glucose, Bld 132 (*) 70 - 99 mg/dL   BUN 13  6 - 23 mg/dL   Creatinine, Ser 0.86  0.50 - 1.35 mg/dL   Calcium 8.9  8.4 - 57.8 mg/dL   GFR calc non Af Amer 79 (*) >90 mL/min   GFR calc Af Amer >90  >90 mL/min  TROPONIN I      Result Value Range   Troponin I <0.30  <0.30 ng/mL  LACTIC ACID, PLASMA      Result Value Range   Lactic Acid, Venous 0.9  0.5 - 2.2 mmol/L   Ct Angio Chest Pe W/cm &/or Wo Cm  11/01/2012  *RADIOLOGY REPORT*  Clinical Data: Shortness of breath.  Hypoxia.  CT ANGIOGRAPHY CHEST  Technique:  Multidetector CT imaging of the chest using the standard protocol during bolus administration of  intravenous contrast. Multiplanar reconstructed images including MIPs were obtained and reviewed to evaluate the vascular anatomy.  Contrast: OMNIPAQUE IOHEXOL 350 MG/ML SOLN  Comparison: Chest CT 05/24/2004.  Findings:  Mediastinum: No filling defects within the pulmonary arterial tree to suggest underlying pulmonary embolism. Heart size is normal. A small amount of pericardial fluid and/or thickening, unlikely to be of hemodynamic significance at this time.  No associated pericardial calcification.  There are numerous borderline enlarged mediastinal and bilateral hilar lymph nodes, largest of which measures up to 9 mm in short axis in the AP window.  These are presumably reactive.  Postoperative changes of a right hemithyroidectomy.  The esophagus is unremarkable in appearance.  Lungs/Pleura: There is severe centrilobular and paraseptal emphysema, with numerous bilateral bullae and blebs, most pronounced at the apices of the lungs bilaterally.  There are numerous nodular masslike airspace opacities in the lungs bilaterally, particularly in the periphery of the left upper lobe. These are favored to be infectious, however, some of them are somewhat solid in appearance and warrant close attention on follow- up studies, including a 13 mm nodule with spiculated margins in the left upper lobe (image 66 of series 5) and a 9 mm nodule with spiculated margins in the medial segment of the right middle lobe (image 61 of series 5).  Mild diffuse bronchial wall thickening. No pleural effusions.  Upper Abdomen: Surgical clips in the upper right retroperitoneum.  Musculoskeletal: There are no aggressive appearing lytic or blastic lesions noted in the visualized portions of the skeleton.  IMPRESSION: 1.  No evidence of pulmonary embolism. 2.  Multiple nodular and mass like areas of air space opacification throughout the lungs bilaterally.  The overall appearance is favored to represent a multi focal atypical infectious  process, potentially even fungal pneumonia.  Clinical correlation is recommended.  The possibility of neoplasm is not excluded, and accordingly, close attention on short-term repeat chest CT in 3 months is recommended. 3.  Diffuse bronchial wall thickening with severe centrilobular paraseptal emphysema; imaging findings compatible with advanced COPD. 4.  Small amount of anterior pericardial fluid and/or thickening, unlikely to be of hemodynamic significance at this time.   Original Report Authenticated By: Trudie Reed, M.D.    Dg Chest Portable 1 View  11/16/2012  *RADIOLOGY REPORT*  Clinical Data: Short of breath  PORTABLE CHEST - 1 VIEW  Comparison: Prior chest x-ray 11/03/2012; prior chest CT 11/01/2012  Findings: Background of severe emphysema and areas of fibrotic change are again noted.  No pneumothorax.  Slightly improved nodular opacity in the lingula compared to prior.  Surgical clips noted in the soft tissues of the right upper neck.  There may be slightly increased opacity in the bilateral bases.  IMPRESSION: Slightly increased opacity in the bilateral bases may reflect atelectasis, or infiltrate superimposed on severe background emphysema and fibrotic change.   Original Report Authenticated By: Malachy Moan, M.D.    Dg Chest Port 1 View  11/03/2012  *RADIOLOGY REPORT*  Clinical Data: Confirm PICC line placement  PORTABLE CHEST - 1 VIEW  Comparison: CT chest dated 11/01/2012  Findings: Right arm PICC terminates at the cavoatrial junction.  Chronic interstitial markings/emphysematous changes.  Patchy lingular opacity, worrisome for pneumonia.  No pleural effusion or pneumothorax.  Surgical clips in the right neck.  IMPRESSION: Right arm PICC terminates at the cavoatrial junction.   Original Report Authenticated By: Charline Bills, M.D.    Dg Chest Portable 1 View  11/01/2012  *RADIOLOGY REPORT*  Clinical Data: Shortness of breath.  PORTABLE CHEST - 1 VIEW  Comparison: 09/25/2012.   Findings: The cardiac silhouette, mediastinal and hilar contours are stable.  Stable underlying emphysematous changes with apical bulla.  Suspect asymmetric perihilar pulmonary edema.  No pleural effusion.  IMPRESSION: Severe chronic emphysematous changes with suspected overlying asymmetric perihilar edema.   Original Report Authenticated By: Rudie Meyer, M.D.      Date: 11/16/2012  Rate: 112  Rhythm: sinus tachycardia  QRS Axis: normal  Intervals: normal  ST/T Wave abnormalities: nonspecific ST changes  Conduction Disutrbances:none  Narrative Interpretation: ST with PVcs  Old EKG Reviewed: unchanged  Steroids, albuterol treatments and IV ABx provided, serial evaluations with persistent wheezing  1:50 AM d/w DR Orvan Falconer, will admit to step down ICU for DR Juanetta Gosling  MDM  SOB, hypoxia and wheezing, still febrile after just finishing Abx for PNA.  Mild improvement with labuterol and steroids in the ED - plan MED admit for persistent wheezing, elevated PCO2 with goal sats 91-93%     I personally performed the services described in this documentation, which was scribed in my presence. The recorded information has been reviewed and is accurate.     Sunnie Nielsen, MD 11/16/12 [id number]

## 2012-11-16 NOTE — Progress Notes (Signed)
Subjective: Patient was admitted last night due cough, congestion and shortness of breath. He was also briefly unconscious. He is feeling much better this morning. His breathing is better.   Objective: Vital signs in last 24 hours: Temp:  [98.2 F (36.8 C)-98.8 F (37.1 C)] 98.8 F (37.1 C) (03/09 0421) Pulse Rate:  [43-114] 106 (03/09 0900) Resp:  [22-28] 28 (03/09 0900) BP: (104-135)/(45-74) 129/74 mmHg (03/09 0600) SpO2:  [84 %-100 %] 86 % (03/09 0946) Weight:  [63.504 kg (140 lb)-66.2 kg (145 lb 15.1 oz)] 66.2 kg (145 lb 15.1 oz) (03/09 0421) Weight change:  Last BM Date: 11/15/12  Intake/Output from previous day: 03/08 0701 - 03/09 0700 In: 284.2 [I.V.:34.2; IV Piggyback:250] Out: 1000 [Urine:1000]  PHYSICAL EXAM General appearance: alert and mild distress Resp: diminished breath sounds bilaterally and wheezes bilaterally Cardio: regular rate and rhythm, S1, S2 normal, no murmur, click, rub or gallop GI: soft, non-tender; bowel sounds normal; no masses,  no organomegaly Extremities: extremities normal, atraumatic, no cyanosis or edema  Lab Results:    @labtest @ [facility]  Recent Labs  11/16/12 0020  PHART 7.312*  PO2ART 75.3*  TCO2 36.5  HCO3 38.4*   CULTURES Recent Results (from the past 240 hour(s))  MRSA PCR SCREENING     Status: None   Collection Time    11/16/12  4:15 AM      Result Value Range Status   MRSA by PCR NEGATIVE  NEGATIVE Final   Comment:            The GeneXpert MRSA Assay (FDA     approved for NASAL specimens     only), is one component of a     comprehensive MRSA colonization     surveillance program. It is not     intended to diagnose MRSA     infection nor to guide or     monitor treatment for     MRSA infections.   Studies/Results: Dg Chest Portable 1 View  11/16/2012  *RADIOLOGY REPORT*  Clinical Data: Short of breath  PORTABLE CHEST - 1 VIEW  Comparison: Prior chest x-ray 11/03/2012; prior chest CT 11/01/2012  Findings:  Background of severe emphysema and areas of fibrotic change are again noted.  No pneumothorax.  Slightly improved nodular opacity in the lingula compared to prior.  Surgical clips noted in the soft tissues of the right upper neck.  There may be slightly increased opacity in the bilateral bases.  IMPRESSION: Slightly increased opacity in the bilateral bases may reflect atelectasis, or infiltrate superimposed on severe background emphysema and fibrotic change.   Original Report Authenticated By: Malachy Moan, M.D.     Medications: I have reviewed the patient's current medications.  Assesment:   Active Problems:   Acute-on-chronic respiratory failure   COPD with exacerbation    Plan: Continue nebulizer treatment IV steroid and iv antibiotics Continue regular treatment    LOS: 1 day   FANTA,TESFAYE 11/16/2012, 11:17 AM

## 2012-11-16 NOTE — H&P (Signed)
[facility] History and Physical  Connor Armstrong  RUE:[id number]  DOB: 1951-04-23   DOA: 11/16/2012   PCP:   Fredirick Maudlin, MD   Chief Complaint:   difficulty breathing and transient unresponsiveness since tonight  HPI: Connor Armstrong is an 62 y.o. male.   Caucasian gentleman with COPD, discharge from the hospital with a PICC line 10 days ago to complete treatment of hospital-acquired pneumonia at home. Treatment was completed 2 days ago and PICC line removed, the patient reports she's been having low-grade fevers as high as 100.9 today, but denies worsening cough.   EMS was called to the home reportedly because his wife said he was unresponsive while on BiPAP; EMS found him to be wheezing and hypoxic; was brought  to the emergency room on the 100% nonrebreather. In the emergency room patient was found to be hypercarbic and hypoxic, but responded incompletely to bronchodilators by nebulizer and steroids.  Chest x-ray in the emergency room shows increased bibasilar opacity, compared to chest x-rays 2 weeks ago.  He has been transferred to the step down unit and at the time of this interview now he says he feels close to his new baseline status since his recent bouts of illnesses.   He has been taking Vicodin for pain  CT angiogram done on his recent admission showed nodules concerning for malignancy and a future repeat CT scan was recommended.  Discharge hemoglobin was 10.8, February 27, today it is 8.6  Rewiew of Systems:  Other than above review of systems is unremarkable   Past Medical History  Diagnosis Date  . COPD (chronic obstructive pulmonary disease)   . Hypertension   . Fibromyalgia   . Shingles   . Cancer     kidney    Past Surgical History  Procedure Laterality Date  . Hernia repair    . Thyroid surgery    . Parathyroid surgery    . Kidney surgery    . Flexible sigmoidoscopy  09/27/2012    JYN:[id number]    Medications:  HOME  MEDS: Prior to Admission medications   Medication Sig Start Date End Date Taking? Authorizing Maurita Havener  acetaminophen (TYLENOL) 500 MG tablet Take 1,000 mg by mouth every 6 (six) hours as needed. For pain    Yes Historical Quanta Roher, MD  albuterol (PROVENTIL HFA;VENTOLIN HFA) 108 (90 BASE) MCG/ACT inhaler Inhale 2 puffs into the lungs every 6 (six) hours as needed. For shortness of breath    Yes Historical Natoria Archibald, MD  albuterol (PROVENTIL) (2.5 MG/3ML) 0.083% nebulizer solution Take 2.5 mg by nebulization 3 (three) times daily. 12/14/11 12/13/12 Yes Fredirick Maudlin, MD  aspirin 81 MG chewable tablet Chew 81 mg by mouth daily.   Yes Historical Ules Marsala, MD  cyclobenzaprine (FLEXERIL) 10 MG tablet Take 10 mg by mouth 2 (two) times daily.     Yes Historical Matalie Romberger, MD  dextromethorphan-guaiFENesin (MUCINEX DM) 30-600 MG per 12 hr tablet Take 1 tablet by mouth every 12 (twelve) hours as needed. For congestion   Yes Historical Eylin Pontarelli, MD  finasteride (PROSCAR) 5 MG tablet Take 5 mg by mouth daily.   Yes Historical Emmanuelle Coxe, MD  Fluticasone-Salmeterol (ADVAIR) 250-50 MCG/DOSE AEPB Inhale 1 puff into the lungs every 12 (twelve) hours.     Yes Historical Levander Katzenstein, MD  gabapentin (NEURONTIN) 300 MG capsule Take 300 mg by mouth at bedtime.   Yes Historical Ahmet Schank, MD  HYDROcodone-acetaminophen (NORCO) 5-325 MG per tablet Take 1 tablet by mouth every 6 (six) hours as  needed. For shingles pain   Yes Historical Kiarra Kidd, MD  Milnacipran (SAVELLA) 50 MG TABS Take 50 mg by mouth 2 (two) times daily.     Yes Historical Davena Julian, MD  roflumilast (DALIRESP) 500 MCG TABS tablet Take 1 tablet (500 mcg total) by mouth daily. 12/14/11  Yes Fredirick Maudlin, MD  SALINE NASAL MIST NA Place 1-2 sprays into the nose as needed (nasal congestion).   Yes Historical Tonimarie Gritz, MD  tiotropium (SPIRIVA) 18 MCG inhalation capsule Place 18 mcg into inhaler and inhale daily.     Yes Historical Meredith Mells, MD  tadalafil (CIALIS) 5 MG  tablet Take 5 mg by mouth daily as needed for erectile dysfunction.     Historical Annaleigha Woo, MD     Allergies:  Allergies  Allergen Reactions  . Morphine And Related Other (See Comments)    Paralysis, "shuts down kidney and bowel tract"     Social History:   reports that he quit smoking about 8 weeks ago. He does not have any smokeless tobacco history on file. He reports that he does not drink alcohol or use illicit drugs.  Family History: Family History  Problem Relation Age of Onset  . Hypertension Mother      Physical Exam: Filed Vitals:   11/16/12 0130 11/16/12 0324 11/16/12 0330 11/16/12 0421  BP: 110/56 123/66 118/61 126/70  Pulse: 109 114 112 109  Temp:    98.8 F (37.1 C)  TempSrc:    Oral  Resp: 24 23 26    Height:    5\' 10"  (1.778 m)  Weight:    66.2 kg (145 lb 15.1 oz)  SpO2: 90% 92% 92% 92%   Blood pressure 126/70, pulse 109, temperature 98.8 F (37.1 C), temperature source Oral, resp. rate 26, height 5\' 10"  (1.778 m), weight 66.2 kg (145 lb 15.1 oz), SpO2 92.00%.  GEN:  Pleasant middle-aged Caucasian gentleman reclining in bed bed in no acute distress; on nasal cannula, cooperative with exam PSYCH:  alert and oriented x4;  neither anxious or depressed; affect is appropriate. HEENT: Mucous membranes pink and anicteric; PERRLA; EOM intact; no cervical lymphadenopathy nor thyromegaly or carotid bruit; no JVD; Breasts:: Not examined CHEST WALL: No tenderness CHEST: Emphysematous barrel shape; prolonged expiration; relatively silent chest; some end expiratory rhonchi HEART: Tachycardic regular rhythm; no murmurs rubs or gallops BACK: Mild kyphosis no scoliosis; no CVA tenderness ABDOMEN:  soft non-tender; no masses, no organomegaly, normal abdominal bowel sounds; no pannus; no intertriginous candida. Rectal Exam: Not done EXTREMITIES:  age-appropriate arthropathy of the hands and knees; no edema; no ulcerations. Genitalia: not examined PULSES: 2+ and  symmetric SKIN: Normal hydration no rash or ulceration CNS: Cranial nerves 2-12 grossly intact no focal lateralizing neurologic deficit   Labs on Admission:  Basic Metabolic Panel:  Recent Labs Lab 11/16/12 0006  NA 137  K 3.6  CL 95*  CO2 40*  GLUCOSE 132*  BUN 13  CREATININE 1.00  CALCIUM 8.9   Liver Function Tests: No results found for this basename: AST, ALT, ALKPHOS, BILITOT, PROT, ALBUMIN,  in the last 168 hours No results found for this basename: LIPASE, AMYLASE,  in the last 168 hours No results found for this basename: AMMONIA,  in the last 168 hours CBC:  Recent Labs Lab 11/16/12 0006  WBC 10.4  NEUTROABS 7.1  HGB 8.6*  HCT 27.5*  MCV 91.1  PLT 219   Cardiac Enzymes:  Recent Labs Lab 11/16/12 0006  TROPONINI <0.30   BNP: No components  found with this basename: POCBNP,  D-dimer: No components found with this basename: D-DIMER,  CBG: No results found for this basename: GLUCAP,  in the last 168 hours  Radiological Exams on Admission: Dg Chest Portable 1 View  11/16/2012  *RADIOLOGY REPORT*  Clinical Data: Short of breath  PORTABLE CHEST - 1 VIEW  Comparison: Prior chest x-ray 11/03/2012; prior chest CT 11/01/2012  Findings: Background of severe emphysema and areas of fibrotic change are again noted.  No pneumothorax.  Slightly improved nodular opacity in the lingula compared to prior.  Surgical clips noted in the soft tissues of the right upper neck.  There may be slightly increased opacity in the bilateral bases.  IMPRESSION: Slightly increased opacity in the bilateral bases may reflect atelectasis, or infiltrate superimposed on severe background emphysema and fibrotic change.   Original Report Authenticated By: Malachy Moan, M.D.      Assessment/Plan Acute on chronic hypercarbic hypoxic respiratory failure Transient unresponsiveness ; probably due to respiratory failure, possibly aggravated by narcotics COPD with acute exacerbation Acute on  chronic anemia Worsening chest x-ray of unclear significance  fibromyalgia on low dose chronic pain medication Lung nodules on recent CT scan  PLAN:  We'll keep this gentleman in the step down unit for pulmonary toilet with bronchodilators, steroid,  mucolytics, oxygen, and BiPAP as necessary.  Broad-spectrum antibiotics have been reinitiated in the emergency room, but since this gentleman is just completed an extended course, I will defer to his primary pulmonologist whether to restart. He has been afebrile since arrival to the emergency room.  Initiate anemia workup and monitor hemoglobin.  Defer timing of followup CT scan to primary pulmonologist Other plans as per orders.  Patient's primary care physician will assume care in a couple hours  Code Status: FULL CODE   Disposition Plan:  Per primary attending  Critical care time: 60 minutes.  CAMPBELL,LEOPOLD Nocturnist [facility] Pager [phone]   11/16/2012, 5:27 AM

## 2012-11-17 ENCOUNTER — Inpatient Hospital Stay (HOSPITAL_COMMUNITY): Payer: Medicare Other

## 2012-11-17 LAB — CBC
HCT: 27.8 % — ABNORMAL LOW (ref 39.0–52.0)
MCH: 28 pg (ref 26.0–34.0)
MCHC: 31.3 g/dL (ref 30.0–36.0)
MCV: 89.4 fL (ref 78.0–100.0)
RDW: 15.6 % — ABNORMAL HIGH (ref 11.5–15.5)

## 2012-11-17 LAB — BASIC METABOLIC PANEL
BUN: 22 mg/dL (ref 6–23)
CO2: 37 mEq/L — ABNORMAL HIGH (ref 19–32)
Chloride: 98 mEq/L (ref 96–112)
Creatinine, Ser: 0.97 mg/dL (ref 0.50–1.35)
GFR calc Af Amer: >90 mL/min (ref >90)
Glucose, Bld: 152 mg/dL — ABNORMAL HIGH (ref 70–99)

## 2012-11-17 LAB — HEMOGLOBIN A1C
Hgb A1c MFr Bld: 5.9 % — ABNORMAL HIGH (ref <5.7)
Mean Plasma Glucose: 123 mg/dL — ABNORMAL HIGH (ref <117)

## 2012-11-17 MED ORDER — IOHEXOL 300 MG/ML  SOLN
80.0000 mL | Freq: Once | INTRAMUSCULAR | Status: AC | PRN
  Administered 2012-11-17: 80 mL via INTRAVENOUS

## 2012-11-17 NOTE — Progress Notes (Signed)
UR chart review completed.  

## 2012-11-17 NOTE — Progress Notes (Signed)
Subjective: He is awake and alert with no new problems. He had episodes of his heart rate being high last night and this morning has sinus tachycardia with a rate of about 110 with PACs. Chest x-ray shows questionable pneumonia but of course there is the concern that this may be some sort of a malignancy although considering fever etc. I think that's somewhat less likely  Objective: Vital signs in last 24 hours: Temp:  [97.7 F (36.5 C)-98.8 F (37.1 C)] 97.7 F (36.5 C) (03/10 0738) Pulse Rate:  [30-111] 84 (03/10 0600) Resp:  [20-29] 21 (03/10 0600) BP: (120-150)/(63-121) 120/64 mmHg (03/10 0600) SpO2:  [83 %-99 %] 97 % (03/10 0735) Weight:  [67 kg (147 lb 11.3 oz)] 67 kg (147 lb 11.3 oz) (03/10 0500) Weight change: 3.496 kg (7 lb 11.3 oz) Last BM Date: 11/15/12  Intake/Output from previous day: 03/09 0701 - 03/10 0700 In: 3250 [P.O.:2100; I.V.:1150] Out: 2100 [Urine:2100]  PHYSICAL EXAM General appearance: alert, cooperative and mild distress Resp: rhonchi bilaterally Cardio: regular rate and rhythm, S1, S2 normal, no murmur, click, rub or gallop GI: soft, non-tender; bowel sounds normal; no masses,  no organomegaly Extremities: extremities normal, atraumatic, no cyanosis or edema  Lab Results:    Basic Metabolic Panel:  Recent Labs  16/10/96 0006 11/17/12 0419  NA 137 138  K 3.6 4.3  CL 95* 98  CO2 40* 37*  GLUCOSE 132* 152*  BUN 13 22  CREATININE 1.00 0.97  CALCIUM 8.9 8.8  MG 2.0  --    Liver Function Tests:  Recent Labs  11/16/12 0006  AST 16  ALT 29  ALKPHOS 72  BILITOT 0.1*  PROT 6.3  ALBUMIN 2.4*   No results found for this basename: LIPASE, AMYLASE,  in the last 72 hours No results found for this basename: AMMONIA,  in the last 72 hours CBC:  Recent Labs  11/16/12 0006 11/17/12 0419  WBC 10.4 10.4  NEUTROABS 7.1  --   HGB 8.6* 8.7*  HCT 27.5* 27.8*  MCV 91.1 89.4  PLT 219 243   Cardiac Enzymes:  Recent Labs  11/16/12 0006   TROPONINI <0.30   BNP: No results found for this basename: PROBNP,  in the last 72 hours D-Dimer: No results found for this basename: DDIMER,  in the last 72 hours CBG: No results found for this basename: GLUCAP,  in the last 72 hours Hemoglobin A1C:  Recent Labs  11/16/12 0006  HGBA1C 5.9*   Fasting Lipid Panel: No results found for this basename: CHOL, HDL, LDLCALC, TRIG, CHOLHDL, LDLDIRECT,  in the last 72 hours Thyroid Function Tests: No results found for this basename: TSH, T4TOTAL, FREET4, T3FREE, THYROIDAB,  in the last 72 hours Anemia Panel: No results found for this basename: VITAMINB12, FOLATE, FERRITIN, TIBC, IRON, RETICCTPCT,  in the last 72 hours Coagulation: No results found for this basename: LABPROT, INR,  in the last 72 hours Urine Drug Screen: Drugs of Abuse  No results found for this basename: labopia, cocainscrnur, labbenz, amphetmu, thcu, labbarb    Alcohol Level: No results found for this basename: ETH,  in the last 72 hours Urinalysis:  Recent Labs  11/16/12 0535  COLORURINE YELLOW  LABSPEC <1.005*  PHURINE 6.5  GLUCOSEU 250*  HGBUR NEGATIVE  BILIRUBINUR NEGATIVE  KETONESUR NEGATIVE  PROTEINUR NEGATIVE  UROBILINOGEN 0.2  NITRITE NEGATIVE  LEUKOCYTESUR NEGATIVE   Misc. Labs:  ABGS  Recent Labs  11/16/12 0020  PHART 7.312*  PO2ART 75.3*  TCO2  36.5  HCO3 38.4*   CULTURES Recent Results (from the past 240 hour(s))  MRSA PCR SCREENING     Status: None   Collection Time    11/16/12  4:15 AM      Result Value Range Status   MRSA by PCR NEGATIVE  NEGATIVE Final   Comment:            The GeneXpert MRSA Assay (FDA     approved for NASAL specimens     only), is one component of a     comprehensive MRSA colonization     surveillance program. It is not     intended to diagnose MRSA     infection nor to guide or     monitor treatment for     MRSA infections.   Studies/Results: Dg Chest Portable 1 View  11/16/2012  *RADIOLOGY  REPORT*  Clinical Data: Short of breath  PORTABLE CHEST - 1 VIEW  Comparison: Prior chest x-ray 11/03/2012; prior chest CT 11/01/2012  Findings: Background of severe emphysema and areas of fibrotic change are again noted.  No pneumothorax.  Slightly improved nodular opacity in the lingula compared to prior.  Surgical clips noted in the soft tissues of the right upper neck.  There may be slightly increased opacity in the bilateral bases.  IMPRESSION: Slightly increased opacity in the bilateral bases may reflect atelectasis, or infiltrate superimposed on severe background emphysema and fibrotic change.   Original Report Authenticated By: Malachy Moan, M.D.     Medications:  Prior to Admission:  Prescriptions prior to admission  Medication Sig Dispense Refill  . acetaminophen (TYLENOL) 500 MG tablet Take 1,000 mg by mouth every 6 (six) hours as needed. For pain       . albuterol (PROVENTIL HFA;VENTOLIN HFA) 108 (90 BASE) MCG/ACT inhaler Inhale 2 puffs into the lungs every 6 (six) hours as needed. For shortness of breath       . albuterol (PROVENTIL) (2.5 MG/3ML) 0.083% nebulizer solution Take 2.5 mg by nebulization 3 (three) times daily.      Marland Kitchen aspirin 81 MG chewable tablet Chew 81 mg by mouth daily.      . cyclobenzaprine (FLEXERIL) 10 MG tablet Take 10 mg by mouth 2 (two) times daily.        Marland Kitchen dextromethorphan-guaiFENesin (MUCINEX DM) 30-600 MG per 12 hr tablet Take 1 tablet by mouth every 12 (twelve) hours as needed. For congestion      . finasteride (PROSCAR) 5 MG tablet Take 5 mg by mouth daily.      . Fluticasone-Salmeterol (ADVAIR) 250-50 MCG/DOSE AEPB Inhale 1 puff into the lungs every 12 (twelve) hours.        . gabapentin (NEURONTIN) 300 MG capsule Take 300 mg by mouth at bedtime.      . Milnacipran (SAVELLA) 50 MG TABS Take 50 mg by mouth 2 (two) times daily.        . roflumilast (DALIRESP) 500 MCG TABS tablet Take 1 tablet (500 mcg total) by mouth daily.  30 tablet  12  . SALINE NASAL  MIST NA Place 1-2 sprays into the nose as needed (nasal congestion).      Marland Kitchen tiotropium (SPIRIVA) 18 MCG inhalation capsule Place 18 mcg into inhaler and inhale daily.        . tadalafil (CIALIS) 5 MG tablet Take 5 mg by mouth daily as needed for erectile dysfunction.        Scheduled: . albuterol  2.5 mg Nebulization Q4H WA  .  aspirin  81 mg Oral Daily  . enoxaparin (LOVENOX) injection  40 mg Subcutaneous Q24H  . finasteride  5 mg Oral Daily  . gabapentin  300 mg Oral QHS  . guaiFENesin  1,200 mg Oral BID  . ipratropium  0.5 mg Nebulization Q4H WA  . methylPREDNISolone (SOLU-MEDROL) injection  125 mg Intravenous Q6H  . Milnacipran  50 mg Oral BID  . mometasone-formoterol  2 puff Inhalation BID  . roflumilast  500 mcg Oral Daily   Continuous: . 0.9 % NaCl with KCl 20 mEq / L 50 mL/hr at 11/17/12 0600   ZOX:[id number], acetaminophen, albuterol, HYDROcodone-acetaminophen, ondansetron (ZOFRAN) IV, ondansetron, sorbitol  Assesment: He has COPD exacerbation. There is some question as to whether his chest x-ray is worse or not. I will get a CT to try to determine where we are with his infiltrate. He has continued to have fever despite 10 days of triple antibiotic therapy. He has end-stage COPD. He had cardiac arrhythmias. Active Problems:   Acute-on-chronic respiratory failure   COPD with exacerbation    Plan: No change in treatments. I will get a chest CT    LOS: 2 days   HAWKINS,EDWARD L 11/17/2012, 8:05 AM

## 2012-11-18 DIAGNOSIS — I369 Nonrheumatic tricuspid valve disorder, unspecified: Secondary | ICD-10-CM

## 2012-11-18 LAB — BASIC METABOLIC PANEL
BUN: 30 mg/dL — ABNORMAL HIGH (ref 6–23)
Calcium: 8.8 mg/dL (ref 8.4–10.5)
Creatinine, Ser: 0.97 mg/dL (ref 0.50–1.35)
GFR calc Af Amer: >90 mL/min (ref >90)
GFR calc non Af Amer: 87 mL/min — ABNORMAL LOW (ref >90)
Glucose, Bld: 139 mg/dL — ABNORMAL HIGH (ref 70–99)

## 2012-11-18 LAB — CBC WITH DIFFERENTIAL/PLATELET
Basophils Absolute: 0 10*3/uL (ref 0.0–0.1)
Basophils Relative: 0 % (ref 0–1)
Eosinophils Absolute: 0 10*3/uL (ref 0.0–0.7)
MCH: 27.7 pg (ref 26.0–34.0)
MCHC: 31.6 g/dL (ref 30.0–36.0)
Neutro Abs: 14.3 10*3/uL — ABNORMAL HIGH (ref 1.7–7.7)
Neutrophils Relative %: 96 % — ABNORMAL HIGH (ref 43–77)
RDW: 15.7 % — ABNORMAL HIGH (ref 11.5–15.5)

## 2012-11-18 LAB — RETICULOCYTES: Retic Ct Pct: 2.6 % (ref 0.4–3.1)

## 2012-11-18 NOTE — Progress Notes (Signed)
Patient is currently active with long-term disease management services with [facility] Care Management Program as a benefit of his [employer]. Patient will receive a post discharge transition of care call and continued monthly home visits for assessments and for education.   Arvella Merles, RN,BSN, [facility], [phone]

## 2012-11-18 NOTE — Progress Notes (Signed)
*  PRELIMINARY RESULTS* Echocardiogram 2D Echocardiogram has been performed.  Conrad [place] 11/18/2012, 1:46 PM

## 2012-11-18 NOTE — Progress Notes (Signed)
Subjective: He says he feels about the same. He has no new complaints. CT scan of the chest done yesterday showed improvement in the bilateral infiltrates which I think confirms the clinical impression that this was infectious not neoplastic. He remains afebrile now although he did have some fever at home. His white blood count is up to 14,000 but he is on steroids. I discussed this with Dr. Orvan Falconer infectious disease specialist and he agrees at this point that antibiotics are not indicated since we don't have definite evidence of infection at this point with no sputum production fever etc.  Objective: Vital signs in last 24 hours: Temp:  [97.7 F (36.5 C)-97.8 F (36.6 C)] 97.7 F (36.5 C) (03/11 0400) Pulse Rate:  [38-117] 87 (03/11 0600) Resp:  [19-26] 20 (03/11 0600) BP: (112-151)/(45-110) 120/74 mmHg (03/11 0600) SpO2:  [87 %-100 %] 95 % (03/11 0706) Weight:  [68.9 kg (151 lb 14.4 oz)] 68.9 kg (151 lb 14.4 oz) (03/11 0500) Weight change: 1.9 kg (4 lb 3 oz) Last BM Date: 11/17/12  Intake/Output from previous day: 03/10 0701 - 03/11 0700 In: 2810 [P.O.:1660; I.V.:1150] Out: 900 [Urine:900]  PHYSICAL EXAM General appearance: alert, cooperative and mild distress Resp: rhonchi bilaterally Cardio: His heart is regular with occasional what appears to be PAC but he remains tachycardic GI: soft, non-tender; bowel sounds normal; no masses,  no organomegaly Extremities: extremities normal, atraumatic, no cyanosis or edema  Lab Results:    Basic Metabolic Panel:  Recent Labs  16/10/96 0006 11/17/12 0419 11/18/12 0424  NA 137 138 137  K 3.6 4.3 4.2  CL 95* 98 98  CO2 40* 37* 33*  GLUCOSE 132* 152* 139*  BUN 13 22 30*  CREATININE 1.00 0.97 0.97  CALCIUM 8.9 8.8 8.8  MG 2.0  --   --    Liver Function Tests:  Recent Labs  11/16/12 0006  AST 16  ALT 29  ALKPHOS 72  BILITOT 0.1*  PROT 6.3  ALBUMIN 2.4*   No results found for this basename: LIPASE, AMYLASE,  in the  last 72 hours No results found for this basename: AMMONIA,  in the last 72 hours CBC:  Recent Labs  11/16/12 0006 11/17/12 0419 11/18/12 0424  WBC 10.4 10.4 14.9*  NEUTROABS 7.1  --  14.3*  HGB 8.6* 8.7* 10.1*  HCT 27.5* 27.8* 32.0*  MCV 91.1 89.4 87.7  PLT 219 243 313   Cardiac Enzymes:  Recent Labs  11/16/12 0006  TROPONINI <0.30   BNP: No results found for this basename: PROBNP,  in the last 72 hours D-Dimer: No results found for this basename: DDIMER,  in the last 72 hours CBG: No results found for this basename: GLUCAP,  in the last 72 hours Hemoglobin A1C:  Recent Labs  11/16/12 0006  HGBA1C 5.9*   Fasting Lipid Panel: No results found for this basename: CHOL, HDL, LDLCALC, TRIG, CHOLHDL, LDLDIRECT,  in the last 72 hours Thyroid Function Tests: No results found for this basename: TSH, T4TOTAL, FREET4, T3FREE, THYROIDAB,  in the last 72 hours Anemia Panel: No results found for this basename: VITAMINB12, FOLATE, FERRITIN, TIBC, IRON, RETICCTPCT,  in the last 72 hours Coagulation: No results found for this basename: LABPROT, INR,  in the last 72 hours Urine Drug Screen: Drugs of Abuse  No results found for this basename: labopia, cocainscrnur, labbenz, amphetmu, thcu, labbarb    Alcohol Level: No results found for this basename: ETH,  in the last 72 hours Urinalysis:  Recent Labs  11/16/12 0535  COLORURINE YELLOW  LABSPEC <1.005*  PHURINE 6.5  GLUCOSEU 250*  HGBUR NEGATIVE  BILIRUBINUR NEGATIVE  KETONESUR NEGATIVE  PROTEINUR NEGATIVE  UROBILINOGEN 0.2  NITRITE NEGATIVE  LEUKOCYTESUR NEGATIVE   Misc. Labs:  ABGS  Recent Labs  11/16/12 0020  PHART 7.312*  PO2ART 75.3*  TCO2 36.5  HCO3 38.4*   CULTURES Recent Results (from the past 240 hour(s))  MRSA PCR SCREENING     Status: None   Collection Time    11/16/12  4:15 AM      Result Value Range Status   MRSA by PCR NEGATIVE  NEGATIVE Final   Comment:            The GeneXpert MRSA  Assay (FDA     approved for NASAL specimens     only), is one component of a     comprehensive MRSA colonization     surveillance program. It is not     intended to diagnose MRSA     infection nor to guide or     monitor treatment for     MRSA infections.   Studies/Results: Ct Chest W Contrast  11/17/2012  *RADIOLOGY REPORT*  Clinical Data: Difficulty breathing.  COPD.  Pneumonia.  Fever. History of right renal cancer with partial nephrectomy.  CT CHEST WITH CONTRAST  Technique:  Multidetector CT imaging of the chest was performed following the standard protocol during bolus administration of intravenous contrast.  Contrast: 80mL OMNIPAQUE IOHEXOL 300 MG/ML  SOLN  Comparison: Chest radiograph 11/16/2012.  Chest CT 11/01/2012.  Findings: There is no axillary adenopathy.  No supraclavicular adenopathy. Borderline AP window lymph node is present (image number 27 series 2), measuring 1 cm short axis.  This AP window node is similar to the prior exam of 11/01/2012.  No hilar adenopathy is present.  Pulmonary arterial hypertension is present associated with COPD. There is persistent lingular nodularity although in general, the mass-like opacification of the left upper lobe has decreased compared to prior.  The linear consolidation adjacent to the major fissure on the left has decreased in size.  Lingular opacity abutting the pericardium has also decreased in size with persistent nodularity.  Overall findings suggest resolving pneumonia with post infectious/inflammatory changes.  The delay in resolution may be due to poor pulmonary toilet in this patient with severe emphysema. Severe apical bullous emphysema is present.  No pneumothorax.  Incidental imaging of the upper abdomen shows a dysmorphic appearance of the right upper renal pole, probably secondary to partial nephrectomy.  Surgical clips are present in the right retroperitoneum.  No effusion is present in the chest.  Surgical clips in the right thyroid  bed. Central airways appear patent.  Tiny amount of anterior pericardial fluid or thickening is unchanged.  IMPRESSION: 1.  Improving bilateral mass-like consolidation, most prominent in the left upper lobe and lingula.  Findings suggest slowly resolving pneumonia with post infectious/inflammatory changes.  Continued follow-up CT and 2-3 months is recommended for further assessment, which was suggested in the prior examination.  Atypical infection is considered less likely based on the improvement. 2.  Borderline 1 cm AP window lymph node, probably unchanged from recent prior exam.  Attention on follow-up recommended. 3.  Severe bullous emphysema.   Original Report Authenticated By: Andreas Newport, M.D.     Medications:  Prior to Admission:  Prescriptions prior to admission  Medication Sig Dispense Refill  . acetaminophen (TYLENOL) 500 MG tablet Take 1,000 mg by mouth every  6 (six) hours as needed. For pain       . albuterol (PROVENTIL HFA;VENTOLIN HFA) 108 (90 BASE) MCG/ACT inhaler Inhale 2 puffs into the lungs every 6 (six) hours as needed. For shortness of breath       . albuterol (PROVENTIL) (2.5 MG/3ML) 0.083% nebulizer solution Take 2.5 mg by nebulization 3 (three) times daily.      Marland Kitchen aspirin 81 MG chewable tablet Chew 81 mg by mouth daily.      . cyclobenzaprine (FLEXERIL) 10 MG tablet Take 10 mg by mouth 2 (two) times daily.        Marland Kitchen dextromethorphan-guaiFENesin (MUCINEX DM) 30-600 MG per 12 hr tablet Take 1 tablet by mouth every 12 (twelve) hours as needed. For congestion      . finasteride (PROSCAR) 5 MG tablet Take 5 mg by mouth daily.      . Fluticasone-Salmeterol (ADVAIR) 250-50 MCG/DOSE AEPB Inhale 1 puff into the lungs every 12 (twelve) hours.        . gabapentin (NEURONTIN) 300 MG capsule Take 300 mg by mouth at bedtime.      . Milnacipran (SAVELLA) 50 MG TABS Take 50 mg by mouth 2 (two) times daily.        . roflumilast (DALIRESP) 500 MCG TABS tablet Take 1 tablet (500 mcg total) by  mouth daily.  30 tablet  12  . SALINE NASAL MIST NA Place 1-2 sprays into the nose as needed (nasal congestion).      Marland Kitchen tiotropium (SPIRIVA) 18 MCG inhalation capsule Place 18 mcg into inhaler and inhale daily.        . tadalafil (CIALIS) 5 MG tablet Take 5 mg by mouth daily as needed for erectile dysfunction.        Scheduled: . albuterol  2.5 mg Nebulization Q4H WA  . aspirin  81 mg Oral Daily  . enoxaparin (LOVENOX) injection  40 mg Subcutaneous Q24H  . finasteride  5 mg Oral Daily  . gabapentin  300 mg Oral QHS  . guaiFENesin  1,200 mg Oral BID  . ipratropium  0.5 mg Nebulization Q4H WA  . methylPREDNISolone (SOLU-MEDROL) injection  125 mg Intravenous Q6H  . Milnacipran  50 mg Oral BID  . mometasone-formoterol  2 puff Inhalation BID  . roflumilast  500 mcg Oral Daily   Continuous: . 0.9 % NaCl with KCl 20 mEq / L 50 mL/hr at 11/18/12 0600   WUJ:[id number], acetaminophen, albuterol, HYDROcodone-acetaminophen, ondansetron (ZOFRAN) IV, ondansetron, sorbitol  Assesment: He has COPD exacerbation. His pneumonia that was seen more on his CT than on chest x-ray looks better by CT criteria. He did receive full course IV antibiotics. He is not having any fever. His white blood count is somewhat elevated but he is on IV steroids. He has tachycardia and I'm not sure if that simply a complication of his COPD. Occasionally it looks like he's in atrial fibrillation but that has not been documented. Active Problems:   Acute-on-chronic respiratory failure   COPD with exacerbation    Plan: Continue current medications. I will reculture his blood and urine. He has had some problems with urinary retention so we need to be sure about his urine culture 2. He'll have an EKG and echocardiogram. Hold on antibiotics for now.    LOS: 3 days   HAWKINS,EDWARD L 11/18/2012, 8:29 AM

## 2012-11-18 NOTE — Care Management Note (Signed)
    Page 1 of 2   11/21/2012     11:11:24 AM   CARE MANAGEMENT NOTE 11/21/2012  Patient:  Connor Armstrong, Connor Armstrong   Account Number:  [id number]  Date Initiated:  11/18/2012  Documentation initiated by:  Sharrie Rothman  Subjective/Objective Assessment:   Pt admitted from home with COPD. Pt lives with his wife and will return home at discharge. Pt is fairly independent with ADL's. Pt is currently active with [facility] and has just finished a 10 day course of IV AB @ home.     Action/Plan:   Will arrange resumption of HH at discharge.   Anticipated DC Date:  11/24/2012   Anticipated DC Plan:  HOME W HOME HEALTH SERVICES      DC Planning Services  CM consult      [facility] Choice  HOME HEALTH   Choice offered to / List presented to:  C-1 Patient        HH arranged  HH-1 RN  HH-2 PT      [facility] agency  [facility]   Status of service:  Completed, signed off Medicare Important Message given?  YES (If response is "NO", the following Medicare IM given date fields will be blank) Date Medicare IM given:  11/21/2012 Date Additional Medicare IM given:    Discharge Disposition:  HOME W HOME HEALTH SERVICES  Per UR Regulation:    If discussed at Long Length of Stay Meetings, dates discussed:   11/20/2012    Comments:  11/21/12 1110 [name], RN BSN CM Pt discharge arranged for later today or tomorrow (pt having procedure today so may not discharge until tomorrow). Pt is currently being followed by [facility] but [facility] had discharged him. Pt would benefit from resumption of [facility] at discharge. Note left for MD. Alroy Bailiff of [facility] is aware of pts discharge and possible resumption of HH. Once [facility] orders written will send to [facility]. No other CM needs noted.  11/19/12 1100 [name], RN BSN CM Pt potential discharge for 11/20/12. Pt is active with [facility] and [facility]. Pt has O2, neb, cpap, and walker. CM will arrange resumption of [facility] at discharge.  11/18/12 1620 [name], RN BSN CM

## 2012-11-19 LAB — IRON AND TIBC
Iron: 11 ug/dL — ABNORMAL LOW (ref 42–135)
TIBC: 236 ug/dL (ref 215–435)
UIBC: 225 ug/dL (ref 125–400)

## 2012-11-19 LAB — CBC WITH DIFFERENTIAL/PLATELET
Basophils Absolute: 0 10*3/uL (ref 0.0–0.1)
Basophils Relative: 0 % (ref 0–1)
Eosinophils Absolute: 0 10*3/uL (ref 0.0–0.7)
Eosinophils Relative: 0 % (ref 0–5)
HCT: 29.4 % — ABNORMAL LOW (ref 39.0–52.0)
Hemoglobin: 9.4 g/dL — ABNORMAL LOW (ref 13.0–17.0)
MCH: 28.1 pg (ref 26.0–34.0)
MCHC: 32 g/dL (ref 30.0–36.0)
MCV: 87.8 fL (ref 78.0–100.0)
Monocytes Absolute: 0.1 10*3/uL (ref 0.1–1.0)
Monocytes Relative: 0 % — ABNORMAL LOW (ref 3–12)
RDW: 15.5 % (ref 11.5–15.5)

## 2012-11-19 LAB — URINE CULTURE
Culture: NO GROWTH
Special Requests: NORMAL

## 2012-11-19 LAB — FERRITIN: Ferritin: 92 ng/mL (ref 22–322)

## 2012-11-19 LAB — OCCULT BLOOD X 1 CARD TO LAB, STOOL: Fecal Occult Bld: POSITIVE — AB

## 2012-11-19 MED ORDER — PREDNISONE 20 MG PO TABS
50.0000 mg | ORAL_TABLET | Freq: Every day | ORAL | Status: DC
  Administered 2012-11-19 – 2012-11-21 (×3): 50 mg via ORAL
  Filled 2012-11-19: qty 2
  Filled 2012-11-19 (×2): qty 1

## 2012-11-19 NOTE — Progress Notes (Signed)
Subjective: He is awake and alert and has no complaints. His heart rate is better. His hemoglobin level yesterday was over 10. He does show some evidence of iron deficiency but we do not have any positive stools at this point. I reminded him to alert the nursing staff if he has a stool so we can check it for blood. His breathing is doing okay. He is not coughing. He has not had any fever.  Objective: Vital signs in last 24 hours: Temp:  [97.5 F (36.4 C)-97.8 F (36.6 C)] 97.5 F (36.4 C) (03/12 0400) Pulse Rate:  [75-110] 75 (03/12 0500) Resp:  [19-27] 19 (03/12 0500) BP: (97-163)/(54-133) 133/72 mmHg (03/12 0500) SpO2:  [91 %-100 %] 96 % (03/12 0703) Weight:  [71.2 kg (156 lb 15.5 oz)] 71.2 kg (156 lb 15.5 oz) (03/12 0500) Weight change: 2.3 kg (5 lb 1.1 oz) Last BM Date: 11/17/12  Intake/Output from previous day: 03/11 0701 - 03/12 0700 In: 2300 [P.O.:1100; I.V.:1200] Out: 1250 [Urine:1250]  PHYSICAL EXAM General appearance: alert, cooperative and no distress Resp: clear to auscultation bilaterally Cardio: regular rate and rhythm, S1, S2 normal, no murmur, click, rub or gallop GI: soft, non-tender; bowel sounds normal; no masses,  no organomegaly Extremities: extremities normal, atraumatic, no cyanosis or edema  Lab Results:    Basic Metabolic Panel:  Recent Labs  96/04/54 0419 11/18/12 0424  NA 138 137  K 4.3 4.2  CL 98 98  CO2 37* 33*  GLUCOSE 152* 139*  BUN 22 30*  CREATININE 0.97 0.97  CALCIUM 8.8 8.8   Liver Function Tests: No results found for this basename: AST, ALT, ALKPHOS, BILITOT, PROT, ALBUMIN,  in the last 72 hours No results found for this basename: LIPASE, AMYLASE,  in the last 72 hours No results found for this basename: AMMONIA,  in the last 72 hours CBC:  Recent Labs  11/17/12 0419 11/18/12 0424  WBC 10.4 14.9*  NEUTROABS  --  14.3*  HGB 8.7* 10.1*  HCT 27.8* 32.0*  MCV 89.4 87.7  PLT 243 313   Cardiac Enzymes: No results found  for this basename: CKTOTAL, CKMB, CKMBINDEX, TROPONINI,  in the last 72 hours BNP: No results found for this basename: PROBNP,  in the last 72 hours D-Dimer: No results found for this basename: DDIMER,  in the last 72 hours CBG: No results found for this basename: GLUCAP,  in the last 72 hours Hemoglobin A1C: No results found for this basename: HGBA1C,  in the last 72 hours Fasting Lipid Panel: No results found for this basename: CHOL, HDL, LDLCALC, TRIG, CHOLHDL, LDLDIRECT,  in the last 72 hours Thyroid Function Tests: No results found for this basename: TSH, T4TOTAL, FREET4, T3FREE, THYROIDAB,  in the last 72 hours Anemia Panel:  Recent Labs  11/18/12 0424  VITAMINB12 573  FOLATE 7.0  FERRITIN 92  TIBC 236  IRON 11*  RETICCTPCT 2.6   Coagulation: No results found for this basename: LABPROT, INR,  in the last 72 hours Urine Drug Screen: Drugs of Abuse  No results found for this basename: labopia, cocainscrnur, labbenz, amphetmu, thcu, labbarb    Alcohol Level: No results found for this basename: ETH,  in the last 72 hours Urinalysis: No results found for this basename: COLORURINE, APPERANCEUR, LABSPEC, PHURINE, GLUCOSEU, HGBUR, BILIRUBINUR, KETONESUR, PROTEINUR, UROBILINOGEN, NITRITE, LEUKOCYTESUR,  in the last 72 hours Misc. Labs:  ABGS No results found for this basename: PHART, PCO2, PO2ART, TCO2, HCO3,  in the last 72 hours CULTURES  Recent Results (from the past 240 hour(s))  MRSA PCR SCREENING     Status: None   Collection Time    11/16/12  4:15 AM      Result Value Range Status   MRSA by PCR NEGATIVE  NEGATIVE Final   Comment:            The GeneXpert MRSA Assay (FDA     approved for NASAL specimens     only), is one component of a     comprehensive MRSA colonization     surveillance program. It is not     intended to diagnose MRSA     infection nor to guide or     monitor treatment for     MRSA infections.  CULTURE, BLOOD (ROUTINE X 2)     Status: None    Collection Time    11/18/12  7:59 AM      Result Value Range Status   Specimen Description Blood   Final   Special Requests Normal   Final   Culture NO GROWTH <24 HRS   Final   Report Status PENDING   Incomplete  CULTURE, BLOOD (ROUTINE X 2)     Status: None   Collection Time    11/18/12  8:04 AM      Result Value Range Status   Specimen Description Blood   Final   Special Requests Normal   Final   Culture NO GROWTH <24 HRS   Final   Report Status PENDING   Incomplete   Studies/Results: Ct Chest W Contrast  11/17/2012  *RADIOLOGY REPORT*  Clinical Data: Difficulty breathing.  COPD.  Pneumonia.  Fever. History of right renal cancer with partial nephrectomy.  CT CHEST WITH CONTRAST  Technique:  Multidetector CT imaging of the chest was performed following the standard protocol during bolus administration of intravenous contrast.  Contrast: 80mL OMNIPAQUE IOHEXOL 300 MG/ML  SOLN  Comparison: Chest radiograph 11/16/2012.  Chest CT 11/01/2012.  Findings: There is no axillary adenopathy.  No supraclavicular adenopathy. Borderline AP window lymph node is present (image number 27 series 2), measuring 1 cm short axis.  This AP window node is similar to the prior exam of 11/01/2012.  No hilar adenopathy is present.  Pulmonary arterial hypertension is present associated with COPD. There is persistent lingular nodularity although in general, the mass-like opacification of the left upper lobe has decreased compared to prior.  The linear consolidation adjacent to the major fissure on the left has decreased in size.  Lingular opacity abutting the pericardium has also decreased in size with persistent nodularity.  Overall findings suggest resolving pneumonia with post infectious/inflammatory changes.  The delay in resolution may be due to poor pulmonary toilet in this patient with severe emphysema. Severe apical bullous emphysema is present.  No pneumothorax.  Incidental imaging of the upper abdomen shows a  dysmorphic appearance of the right upper renal pole, probably secondary to partial nephrectomy.  Surgical clips are present in the right retroperitoneum.  No effusion is present in the chest.  Surgical clips in the right thyroid bed. Central airways appear patent.  Tiny amount of anterior pericardial fluid or thickening is unchanged.  IMPRESSION: 1.  Improving bilateral mass-like consolidation, most prominent in the left upper lobe and lingula.  Findings suggest slowly resolving pneumonia with post infectious/inflammatory changes.  Continued follow-up CT and 2-3 months is recommended for further assessment, which was suggested in the prior examination.  Atypical infection is considered less likely based on the  improvement. 2.  Borderline 1 cm AP window lymph node, probably unchanged from recent prior exam.  Attention on follow-up recommended. 3.  Severe bullous emphysema.   Original Report Authenticated By: Andreas Newport, M.D.     Medications:  Prior to Admission:  Prescriptions prior to admission  Medication Sig Dispense Refill  . acetaminophen (TYLENOL) 500 MG tablet Take 1,000 mg by mouth every 6 (six) hours as needed. For pain       . albuterol (PROVENTIL HFA;VENTOLIN HFA) 108 (90 BASE) MCG/ACT inhaler Inhale 2 puffs into the lungs every 6 (six) hours as needed. For shortness of breath       . albuterol (PROVENTIL) (2.5 MG/3ML) 0.083% nebulizer solution Take 2.5 mg by nebulization 3 (three) times daily.      Marland Kitchen aspirin 81 MG chewable tablet Chew 81 mg by mouth daily.      . cyclobenzaprine (FLEXERIL) 10 MG tablet Take 10 mg by mouth 2 (two) times daily.        Marland Kitchen dextromethorphan-guaiFENesin (MUCINEX DM) 30-600 MG per 12 hr tablet Take 1 tablet by mouth every 12 (twelve) hours as needed. For congestion      . finasteride (PROSCAR) 5 MG tablet Take 5 mg by mouth daily.      . Fluticasone-Salmeterol (ADVAIR) 250-50 MCG/DOSE AEPB Inhale 1 puff into the lungs every 12 (twelve) hours.        .  gabapentin (NEURONTIN) 300 MG capsule Take 300 mg by mouth at bedtime.      . Milnacipran (SAVELLA) 50 MG TABS Take 50 mg by mouth 2 (two) times daily.        . roflumilast (DALIRESP) 500 MCG TABS tablet Take 1 tablet (500 mcg total) by mouth daily.  30 tablet  12  . SALINE NASAL MIST NA Place 1-2 sprays into the nose as needed (nasal congestion).      Marland Kitchen tiotropium (SPIRIVA) 18 MCG inhalation capsule Place 18 mcg into inhaler and inhale daily.        . tadalafil (CIALIS) 5 MG tablet Take 5 mg by mouth daily as needed for erectile dysfunction.        Scheduled: . albuterol  2.5 mg Nebulization Q4H WA  . aspirin  81 mg Oral Daily  . enoxaparin (LOVENOX) injection  40 mg Subcutaneous Q24H  . finasteride  5 mg Oral Daily  . gabapentin  300 mg Oral QHS  . guaiFENesin  1,200 mg Oral BID  . ipratropium  0.5 mg Nebulization Q4H WA  . methylPREDNISolone (SOLU-MEDROL) injection  125 mg Intravenous Q6H  . Milnacipran  50 mg Oral BID  . mometasone-formoterol  2 puff Inhalation BID  . roflumilast  500 mcg Oral Daily   Continuous: . 0.9 % NaCl with KCl 20 mEq / L 50 mL/hr at 11/19/12 0600   BJY:[id number], acetaminophen, albuterol, HYDROcodone-acetaminophen, ondansetron (ZOFRAN) IV, ondansetron, sorbitol  Assesment: He has acute on chronic respiratory failure. He has COPD exacerbation. He was treated for healthcare associated pneumonia in his CT shows that has improved. Since he has not had fever he will be off antibiotics. Echocardiogram did not show significant left ventricular function abnormalities. EKG does not show atrial fibrillation. His hemoglobin level was better but will be rechecked today. Probably does have some iron deficiency anemia which may be related to his hospitalization in January with severe problems with ileus. Depending on his hemoglobin level he may need EGD but that can probably be accomplished as an outpatient. I think he is ready  to move from the step down unit Active  Problems:   Hypertension   Fibromyalgia   Acute-on-chronic respiratory failure   COPD with exacerbation    Plan: Transfer from step down continue his current treatments check his stool for blood discontinue Solu-Medrol start him on iron repeat his CBC and potential discharge tomorrow    LOS: 4 days   HAWKINS,EDWARD L 11/19/2012, 8:00 AM

## 2012-11-19 NOTE — Progress Notes (Signed)
Report given to Charna Busman, RN.  Patient ready for transfer to room 308. No acute distress noted.

## 2012-11-20 ENCOUNTER — Encounter (HOSPITAL_COMMUNITY): Payer: Self-pay | Admitting: Gastroenterology

## 2012-11-20 DIAGNOSIS — R195 Other fecal abnormalities: Secondary | ICD-10-CM

## 2012-11-20 DIAGNOSIS — D649 Anemia, unspecified: Secondary | ICD-10-CM

## 2012-11-20 DIAGNOSIS — R1310 Dysphagia, unspecified: Secondary | ICD-10-CM

## 2012-11-20 LAB — CBC WITH DIFFERENTIAL/PLATELET
Basophils Absolute: 0 10*3/uL (ref 0.0–0.1)
Eosinophils Absolute: 0 10*3/uL (ref 0.0–0.7)
Eosinophils Relative: 0 % (ref 0–5)
Lymphocytes Relative: 8 % — ABNORMAL LOW (ref 12–46)
MCH: 27.8 pg (ref 26.0–34.0)
MCV: 88.2 fL (ref 78.0–100.0)
Platelets: 383 10*3/uL (ref 150–400)
RDW: 16 % — ABNORMAL HIGH (ref 11.5–15.5)
WBC: 12.5 10*3/uL — ABNORMAL HIGH (ref 4.0–10.5)

## 2012-11-20 LAB — BASIC METABOLIC PANEL
CO2: 35 mEq/L — ABNORMAL HIGH (ref 19–32)
Calcium: 8.1 mg/dL — ABNORMAL LOW (ref 8.4–10.5)
Creatinine, Ser: 0.89 mg/dL (ref 0.50–1.35)

## 2012-11-20 MED ORDER — SODIUM CHLORIDE 0.9 % IV SOLN
INTRAVENOUS | Status: DC
  Administered 2012-11-20 – 2012-11-21 (×2): via INTRAVENOUS

## 2012-11-20 MED ORDER — PANTOPRAZOLE SODIUM 40 MG PO TBEC
40.0000 mg | DELAYED_RELEASE_TABLET | Freq: Every day | ORAL | Status: DC
  Administered 2012-11-20 – 2012-11-21 (×2): 40 mg via ORAL
  Filled 2012-11-20 (×2): qty 1

## 2012-11-20 NOTE — Consult Note (Signed)
Referring Provider: Dr. Juanetta Gosling Primary Care Physician:  Fredirick Maudlin, MD Primary Gastroenterologist:  Dr. Darrick Penna   Date of Admission: 11/16/12 Date of Consultation: 11/20/12  Reason for Consultation:  Anemia, heme positive stools  HPI:  Connor Armstrong is a pleasant 62 year old male with a history of COPD, recent discharge 10/2012 after admission for pneumonia, now readmitted with COPD exacerbation and acute on chronic anemia. He is known to our practice from Jan 2014; at that time he was treated for a colonic ileus which resulted from likely medication effect and acute illness. He underwent a flex sig by Dr. Darrick Penna at that time. No prior colonoscopy or upper endoscopy. His discharge Hgb at that time was in the 12 range. Admitting Hgb 8.6.  Prior Hgb in the 11-12 range as of 1 year ago. Normocytic pattern. Anemia panel reveals low iron and ferritin was actually low at 11 approximately 2 months ago. Heme positive this admission.  He denies any melena or hematochezia. No abdominal pain, no N/V. Notes loss of appetite, intermittent for the last 3 months. Reports weight loss of approximately 45 lbs in the last year. Unintentional. Notes was under stress to include health and financial. Occasional early satiety. Within the last month he has experienced occasional heartburn. No PPI, states it flares while in hospital. Sometimes chokes on his food when he feels like he is short of breath, trying to breathe, ends up choking on food. Notes esophageal dysphagia to solid foods and pills. Present for the past few months. Avoids NSAIDs, aspirin powders. States at home he has a BM about twice a day, fairly regular. Notes during hospital admissions he becomes more constipated.   Past Medical History  Diagnosis Date  . COPD (chronic obstructive pulmonary disease)   . Hypertension   . Fibromyalgia   . Shingles   . Cancer     kidney    Past Surgical History  Procedure Laterality Date  . Hernia repair    .  Thyroid surgery    . Parathyroid surgery    . Kidney surgery    . Flexible sigmoidoscopy  09/27/2012    WJX:[id number]    Prior to Admission medications   Medication Sig Start Date End Date Taking? Authorizing Provider  acetaminophen (TYLENOL) 500 MG tablet Take 1,000 mg by mouth every 6 (six) hours as needed. For pain    Yes Historical Provider, MD  albuterol (PROVENTIL HFA;VENTOLIN HFA) 108 (90 BASE) MCG/ACT inhaler Inhale 2 puffs into the lungs every 6 (six) hours as needed. For shortness of breath    Yes Historical Provider, MD  albuterol (PROVENTIL) (2.5 MG/3ML) 0.083% nebulizer solution Take 2.5 mg by nebulization 3 (three) times daily. 12/14/11 12/13/12 Yes Fredirick Maudlin, MD  aspirin 81 MG chewable tablet Chew 81 mg by mouth daily.   Yes Historical Provider, MD  cyclobenzaprine (FLEXERIL) 10 MG tablet Take 10 mg by mouth 2 (two) times daily.     Yes Historical Provider, MD  dextromethorphan-guaiFENesin (MUCINEX DM) 30-600 MG per 12 hr tablet Take 1 tablet by mouth every 12 (twelve) hours as needed. For congestion   Yes Historical Provider, MD  finasteride (PROSCAR) 5 MG tablet Take 5 mg by mouth daily.   Yes Historical Provider, MD  Fluticasone-Salmeterol (ADVAIR) 250-50 MCG/DOSE AEPB Inhale 1 puff into the lungs every 12 (twelve) hours.     Yes Historical Provider, MD  gabapentin (NEURONTIN) 300 MG capsule Take 300 mg by mouth at bedtime.   Yes Historical Provider, MD  Milnacipran (  SAVELLA) 50 MG TABS Take 50 mg by mouth 2 (two) times daily.     Yes Historical Provider, MD  roflumilast (DALIRESP) 500 MCG TABS tablet Take 1 tablet (500 mcg total) by mouth daily. 12/14/11  Yes Fredirick Maudlin, MD  SALINE NASAL MIST NA Place 1-2 sprays into the nose as needed (nasal congestion).   Yes Historical Provider, MD  tiotropium (SPIRIVA) 18 MCG inhalation capsule Place 18 mcg into inhaler and inhale daily.     Yes Historical Provider, MD  tadalafil (CIALIS) 5 MG tablet Take 5 mg by  mouth daily as needed for erectile dysfunction.     Historical Provider, MD    Current Facility-Administered Medications  Medication Dose Route Frequency Provider Last Rate Last Dose  . 0.9 % NaCl with KCl 20 mEq/ L  infusion   Intravenous Continuous Vania Rea, MD 50 mL/hr at 11/19/12 1442    . acetaminophen (TYLENOL) tablet 650 mg  650 mg Oral Q6H PRN Vania Rea, MD       Or  . acetaminophen (TYLENOL) suppository 650 mg  650 mg Rectal Q6H PRN Vania Rea, MD      . albuterol (PROVENTIL) (5 MG/ML) 0.5% nebulizer solution 2.5 mg  2.5 mg Nebulization Q4H WA Vania Rea, MD   2.5 mg at 11/20/12 0708  . albuterol (PROVENTIL) (5 MG/ML) 0.5% nebulizer solution 2.5 mg  2.5 mg Nebulization Q2H PRN Vania Rea, MD   2.5 mg at 11/16/12 0915  . aspirin chewable tablet 81 mg  81 mg Oral Daily Vania Rea, MD   81 mg at 11/19/12 1019  . enoxaparin (LOVENOX) injection 40 mg  40 mg Subcutaneous Q24H Vania Rea, MD   40 mg at 11/20/12 0813  . finasteride (PROSCAR) tablet 5 mg  5 mg Oral Daily Vania Rea, MD   5 mg at 11/19/12 1019  . gabapentin (NEURONTIN) capsule 300 mg  300 mg Oral QHS Vania Rea, MD   300 mg at 11/19/12 2214  . guaiFENesin (MUCINEX) 12 hr tablet 1,200 mg  1,200 mg Oral BID Vania Rea, MD   1,200 mg at 11/19/12 2213  . HYDROcodone-acetaminophen (NORCO/VICODIN) 5-325 MG per tablet 1 tablet  1 tablet Oral Q6H PRN Vania Rea, MD      . ipratropium (ATROVENT) nebulizer solution 0.5 mg  0.5 mg Nebulization Q4H WA Vania Rea, MD   0.5 mg at 11/20/12 0708  . Milnacipran (SAVELLA) tablet TABS 50 mg  50 mg Oral BID Vania Rea, MD   50 mg at 11/19/12 1019  . mometasone-formoterol (DULERA) 100-5 MCG/ACT inhaler 2 puff  2 puff Inhalation BID Vania Rea, MD   2 puff at 11/20/12 0710  . ondansetron (ZOFRAN) tablet 4 mg  4 mg Oral Q6H PRN Vania Rea, MD       Or  . ondansetron [facility]) injection 4 mg  4 mg Intravenous  Q6H PRN Vania Rea, MD      . predniSONE (DELTASONE) tablet 50 mg  50 mg Oral Q breakfast Fredirick Maudlin, MD   50 mg at 11/20/12 0814  . roflumilast (DALIRESP) tablet 500 mcg  500 mcg Oral Daily Vania Rea, MD   500 mcg at 11/19/12 1019  . sorbitol 70 % solution 30 mL  30 mL Oral Daily PRN Vania Rea, MD        Allergies as of 11/15/2012 - Review Complete 11/15/2012  Allergen Reaction Noted  . Morphine and related Other (See Comments) 11/01/2012    Family History  Problem  Relation Age of Onset  . Hypertension Mother     History   Social History  . Marital Status: Married    Spouse Name: N/A    Number of Children: N/A  . Years of Education: N/A   Occupational History  . Not on file.   Social History Main Topics  . Smoking status: Former Smoker -- 1.00 packs/day    Quit date: 09/20/2012  . Smokeless tobacco: Not on file  . Alcohol Use: No  . Drug Use: No  . Sexually Active: Not Currently   Other Topics Concern  . Not on file   Social History Narrative  . No narrative on file    Review of Systems: Gen: SEE HPI CV: states several episodes of atrial fib while inpatient, Dr. Juanetta Gosling aware Resp: +DOE GI: SEE HPI GU : Denies urinary burning, urinary frequency, urinary incontinence.  MS: +fibromyalgia Derm: Denies rash, itching, dry skin Psych: +anxiety, some depression Heme: Denies bruising, bleeding, and enlarged lymph nodes.  Physical Exam: Vital signs in last 24 hours: Temp:  [97.4 F (36.3 C)-97.8 F (36.6 C)] 97.8 F (36.6 C) (03/13 0409) Pulse Rate:  [90-110] 90 (03/13 0409) Resp:  [24] 24 (03/13 0409) BP: (104-137)/(78-83) 104/82 mmHg (03/13 0409) SpO2:  [94 %-100 %] 100 % (03/13 0710) Weight:  [159 lb 2.8 oz (72.2 kg)] 159 lb 2.8 oz (72.2 kg) (03/13 0409) Last BM Date: 11/19/12 General:   Alert,  Well-developed, well-nourished, pleasant and cooperative in NAD Head:  Normocephalic and atraumatic. Eyes:  Sclera clear, no icterus.    Conjunctiva pink. Ears:  Normal auditory acuity. Nose:  No deformity, discharge,  or lesions. Mouth:  No deformity or lesions Neck:  Supple; no masses or thyromegaly. Lungs: clear to auscultation Heart:  Regular rate and rhythm; no murmurs, clicks, rubs,  or gallops. Abdomen:  Soft and nondistended. Mild tenderness with palpation in epigastric region and LUQ. No masses, hepatosplenomegaly or hernias noted. Normal bowel sounds, without guarding, and without rebound.   Rectal:  Deferred  Msk:  Symmetrical without gross deformities. Normal posture. Pulses:  Normal pulses noted. Extremities:  Without clubbing or edema. Neurologic:  Alert and  oriented x4;  grossly normal neurologically. Skin:  Intact without significant lesions or rashes. Cervical Nodes:  No significant cervical adenopathy. Psych:  Alert and cooperative. Normal mood and affect.  Intake/Output from previous day: 03/12 0701 - 03/13 0700 In: 580 [P.O.:480; I.V.:100] Out: 775 [Urine:775] Intake/Output this shift:    Lab Results:  Recent Labs  11/18/12 0424 11/19/12 0841 11/20/12 0447  WBC 14.9* 13.4* 12.5*  HGB 10.1* 9.4* 9.2*  HCT 32.0* 29.4* 29.2*  PLT 313 367 383   BMET  Recent Labs  11/18/12 0424 11/20/12 0447  NA 137 140  K 4.2 4.1  CL 98 103  CO2 33* 35*  GLUCOSE 139* 84  BUN 30* 26*  CREATININE 0.97 0.89  CALCIUM 8.8 8.1*    Impression: 62 year old male admitted with COPD exacerbation and acute on chronic anemia, noted to have heme positive stools. He is known to [place] from a prior admission in January with a colonic ileus. After review of historical labs, it appears his baseline Hgb is in the 11-12 range. This admission, his Hgb on admission was 8.6. Anemia panel with low iron, normal ferritin. However, ferritin low at 11 a few months ago. He has no signs of overt GI bleeding to include melena or hematochezia. He reports vague symptoms of early satiety, but it is also concerning  that he has lost 45  lbs in the past year unintentionally. Upon further discussion with patient, he admits solid food and pill dysphagia over the past few months. He has not had a complete lower GI evaluation other than a flexible sigmoidoscopy recently. No upper GI evaluation.  Due to dyspepsia, dysphagia, and evidence of possible iron deficiency component to anemia, recommend upper endoscopy at a minimum with possible dilation during this admission. However, he received a dose of Lovenox this morning at 8am. This would increase risk of bleeding with dilation or biopsies; as he is not actively bleeding, I recommend upper endoscopy tomorrow with Dr. Darrick Penna. He ultimately needs a full colonoscopy in the future, but this is not urgent. Patient is desiring to proceed with an EGD today; I informed him this may need to be put on hold until tomorrow. I will discuss timing with Dr. Jena Gauss. Dr. Darrick Penna would be completing procedure(s) Friday if not done today. Patient is aware of both scenarios.   Plan: EGD with dilation in near future (Likely 3/14 due to Lovenox dose received this morning) Colonoscopy as outpatient  Will need to hold Lovenox dosing Remain NPO for now Add PPI daily    LOS: 5 days    11/20/2012, 9:03 AM   Discussed with Dr. Jena Gauss. Proceed with EGD with Dr. Darrick Penna tomorrow. NPO after MN, hold Lovenox dosing for tomorrow am. May resume diet today. Colonoscopy outpatient.    Patient seen and examined.  Agree with above algorithm as outlined.

## 2012-11-20 NOTE — Progress Notes (Signed)
Subjective: He says he feels okay. He has no new complaints. His breathing is okay. His stool is heme positive so I think is going to have to have a GI consult inpatient.  Objective: Vital signs in last 24 hours: Temp:  [97.4 F (36.3 C)-97.8 F (36.6 C)] 97.8 F (36.6 C) (03/13 0409) Pulse Rate:  [90-110] 90 (03/13 0409) Resp:  [24-25] 24 (03/13 0409) BP: (104-137)/(65-83) 104/82 mmHg (03/13 0409) SpO2:  [94 %-100 %] 100 % (03/13 0710) Weight:  [72.2 kg (159 lb 2.8 oz)] 72.2 kg (159 lb 2.8 oz) (03/13 0409) Weight change: 1 kg (2 lb 3.3 oz) Last BM Date: 11/19/12  Intake/Output from previous day: 03/12 0701 - 03/13 0700 In: 580 [P.O.:480; I.V.:100] Out: 775 [Urine:775]  PHYSICAL EXAM General appearance: alert, cooperative and no distress Resp: rhonchi bilaterally Cardio: regular rate and rhythm, S1, S2 normal, no murmur, click, rub or gallop GI: soft, non-tender; bowel sounds normal; no masses,  no organomegaly Extremities: extremities normal, atraumatic, no cyanosis or edema  Lab Results:    Basic Metabolic Panel:  Recent Labs  16/10/96 0424 11/20/12 0447  NA 137 140  K 4.2 4.1  CL 98 103  CO2 33* 35*  GLUCOSE 139* 84  BUN 30* 26*  CREATININE 0.97 0.89  CALCIUM 8.8 8.1*   Liver Function Tests: No results found for this basename: AST, ALT, ALKPHOS, BILITOT, PROT, ALBUMIN,  in the last 72 hours No results found for this basename: LIPASE, AMYLASE,  in the last 72 hours No results found for this basename: AMMONIA,  in the last 72 hours CBC:  Recent Labs  11/19/12 0841 11/20/12 0447  WBC 13.4* 12.5*  NEUTROABS 13.1* 10.4*  HGB 9.4* 9.2*  HCT 29.4* 29.2*  MCV 87.8 88.2  PLT 367 383   Cardiac Enzymes: No results found for this basename: CKTOTAL, CKMB, CKMBINDEX, TROPONINI,  in the last 72 hours BNP: No results found for this basename: PROBNP,  in the last 72 hours D-Dimer: No results found for this basename: DDIMER,  in the last 72 hours CBG: No  results found for this basename: GLUCAP,  in the last 72 hours Hemoglobin A1C: No results found for this basename: HGBA1C,  in the last 72 hours Fasting Lipid Panel: No results found for this basename: CHOL, HDL, LDLCALC, TRIG, CHOLHDL, LDLDIRECT,  in the last 72 hours Thyroid Function Tests: No results found for this basename: TSH, T4TOTAL, FREET4, T3FREE, THYROIDAB,  in the last 72 hours Anemia Panel:  Recent Labs  11/18/12 0424  VITAMINB12 573  FOLATE 7.0  FERRITIN 92  TIBC 236  IRON 11*  RETICCTPCT 2.6   Coagulation: No results found for this basename: LABPROT, INR,  in the last 72 hours Urine Drug Screen: Drugs of Abuse  No results found for this basename: labopia, cocainscrnur, labbenz, amphetmu, thcu, labbarb    Alcohol Level: No results found for this basename: ETH,  in the last 72 hours Urinalysis: No results found for this basename: COLORURINE, APPERANCEUR, LABSPEC, PHURINE, GLUCOSEU, HGBUR, BILIRUBINUR, KETONESUR, PROTEINUR, UROBILINOGEN, NITRITE, LEUKOCYTESUR,  in the last 72 hours Misc. Labs:  ABGS No results found for this basename: PHART, PCO2, PO2ART, TCO2, HCO3,  in the last 72 hours CULTURES Recent Results (from the past 240 hour(s))  MRSA PCR SCREENING     Status: None   Collection Time    11/16/12  4:15 AM      Result Value Range Status   MRSA by PCR NEGATIVE  NEGATIVE Final  Comment:            The GeneXpert MRSA Assay (FDA     approved for NASAL specimens     only), is one component of a     comprehensive MRSA colonization     surveillance program. It is not     intended to diagnose MRSA     infection nor to guide or     monitor treatment for     MRSA infections.  CULTURE, BLOOD (ROUTINE X 2)     Status: None   Collection Time    11/18/12  7:59 AM      Result Value Range Status   Specimen Description BLOOD LEFT HAND   Final   Special Requests BOTTLES DRAWN AEROBIC AND ANAEROBIC 6CC   Final   Culture NO GROWTH 1 DAY   Final   Report  Status PENDING   Incomplete  CULTURE, BLOOD (ROUTINE X 2)     Status: None   Collection Time    11/18/12  8:04 AM      Result Value Range Status   Specimen Description BLOOD RIGHT ARM   Final   Special Requests BOTTLES DRAWN AEROBIC AND ANAEROBIC 6CC   Final   Culture NO GROWTH 1 DAY   Final   Report Status PENDING   Incomplete  URINE CULTURE     Status: None   Collection Time    11/18/12  8:51 AM      Result Value Range Status   Specimen Description URINE, CLEAN CATCH   Final   Special Requests Normal   Final   Culture  Setup Time 11/18/2012 16:32   Final   Colony Count NO GROWTH   Final   Culture NO GROWTH   Final   Report Status 11/19/2012 FINAL   Final   Studies/Results: No results found.  Medications:  Prior to Admission:  Prescriptions prior to admission  Medication Sig Dispense Refill  . acetaminophen (TYLENOL) 500 MG tablet Take 1,000 mg by mouth every 6 (six) hours as needed. For pain       . albuterol (PROVENTIL HFA;VENTOLIN HFA) 108 (90 BASE) MCG/ACT inhaler Inhale 2 puffs into the lungs every 6 (six) hours as needed. For shortness of breath       . albuterol (PROVENTIL) (2.5 MG/3ML) 0.083% nebulizer solution Take 2.5 mg by nebulization 3 (three) times daily.      Marland Kitchen aspirin 81 MG chewable tablet Chew 81 mg by mouth daily.      . cyclobenzaprine (FLEXERIL) 10 MG tablet Take 10 mg by mouth 2 (two) times daily.        Marland Kitchen dextromethorphan-guaiFENesin (MUCINEX DM) 30-600 MG per 12 hr tablet Take 1 tablet by mouth every 12 (twelve) hours as needed. For congestion      . finasteride (PROSCAR) 5 MG tablet Take 5 mg by mouth daily.      . Fluticasone-Salmeterol (ADVAIR) 250-50 MCG/DOSE AEPB Inhale 1 puff into the lungs every 12 (twelve) hours.        . gabapentin (NEURONTIN) 300 MG capsule Take 300 mg by mouth at bedtime.      . Milnacipran (SAVELLA) 50 MG TABS Take 50 mg by mouth 2 (two) times daily.        . roflumilast (DALIRESP) 500 MCG TABS tablet Take 1 tablet (500 mcg  total) by mouth daily.  30 tablet  12  . SALINE NASAL MIST NA Place 1-2 sprays into the nose as needed (nasal congestion).      Marland Kitchen  tiotropium (SPIRIVA) 18 MCG inhalation capsule Place 18 mcg into inhaler and inhale daily.        . tadalafil (CIALIS) 5 MG tablet Take 5 mg by mouth daily as needed for erectile dysfunction.        Scheduled: . albuterol  2.5 mg Nebulization Q4H WA  . aspirin  81 mg Oral Daily  . enoxaparin (LOVENOX) injection  40 mg Subcutaneous Q24H  . finasteride  5 mg Oral Daily  . gabapentin  300 mg Oral QHS  . guaiFENesin  1,200 mg Oral BID  . ipratropium  0.5 mg Nebulization Q4H WA  . Milnacipran  50 mg Oral BID  . mometasone-formoterol  2 puff Inhalation BID  . predniSONE  50 mg Oral Q breakfast  . roflumilast  500 mcg Oral Daily   Continuous: . 0.9 % NaCl with KCl 20 mEq / L 50 mL/hr at 11/19/12 1442   QMV:[id number], acetaminophen, albuterol, HYDROcodone-acetaminophen, ondansetron (ZOFRAN) IV, ondansetron, sorbitol  Assesment: He has COPD exacerbation with acute on chronic respiratory failure. He has iron deficiency anemia. His stool is positive. Since he is anemic and has a positive stool I will go ahead with GI consultation as an inpatient rather than as an outpatient which I had previously planned when it did not seem that he was going to have positive stools with active bleeding. Active Problems:   Hypertension   Fibromyalgia   Acute-on-chronic respiratory failure   COPD with exacerbation    Plan: GI consult    LOS: 5 days   HAWKINS,EDWARD L 11/20/2012, 8:53 AM

## 2012-11-21 ENCOUNTER — Encounter (HOSPITAL_COMMUNITY)
Admission: EM | Disposition: A | Discharge status: Home Health | Payer: Self-pay | Source: Home / Self Care | Attending: Pulmonary Disease

## 2012-11-21 ENCOUNTER — Encounter (HOSPITAL_COMMUNITY): Payer: Self-pay | Admitting: *Deleted

## 2012-11-21 DIAGNOSIS — K2990 Gastroduodenitis, unspecified, without bleeding: Secondary | ICD-10-CM

## 2012-11-21 DIAGNOSIS — R634 Abnormal weight loss: Secondary | ICD-10-CM

## 2012-11-21 DIAGNOSIS — K2970 Gastritis, unspecified, without bleeding: Secondary | ICD-10-CM

## 2012-11-21 DIAGNOSIS — R1310 Dysphagia, unspecified: Secondary | ICD-10-CM

## 2012-11-21 HISTORY — PX: ESOPHAGOGASTRODUODENOSCOPY (EGD) WITH ESOPHAGEAL DILATION: SHX5812

## 2012-11-21 LAB — KOH PREP

## 2012-11-21 SURGERY — ESOPHAGOGASTRODUODENOSCOPY (EGD) WITH ESOPHAGEAL DILATION
Anesthesia: Moderate Sedation

## 2012-11-21 MED ORDER — MIDAZOLAM HCL 5 MG/5ML IJ SOLN
INTRAMUSCULAR | Status: AC
  Filled 2012-11-21: qty 10

## 2012-11-21 MED ORDER — MEPERIDINE HCL 100 MG/ML IJ SOLN
INTRAMUSCULAR | Status: AC
  Filled 2012-11-21: qty 2

## 2012-11-21 MED ORDER — PREDNISONE (PAK) 10 MG PO TABS: 20.0000 mg | ORAL_TABLET | Freq: Every day | ORAL | Status: DC

## 2012-11-21 MED ORDER — STERILE WATER FOR IRRIGATION IR SOLN
Status: DC | PRN
  Administered 2012-11-21: 15:00:00

## 2012-11-21 MED ORDER — PANTOPRAZOLE SODIUM 40 MG PO TBEC: 40.0000 mg | DELAYED_RELEASE_TABLET | Freq: Every day | ORAL | Status: DC

## 2012-11-21 MED ORDER — MEPERIDINE HCL 100 MG/ML IJ SOLN
INTRAMUSCULAR | Status: DC | PRN
  Administered 2012-11-21 (×2): 25 mg via INTRAVENOUS

## 2012-11-21 MED ORDER — BUTAMBEN-TETRACAINE-BENZOCAINE 2-2-14 % EX AERO
INHALATION_SPRAY | CUTANEOUS | Status: DC | PRN
  Administered 2012-11-21: 2 via TOPICAL

## 2012-11-21 MED ORDER — MINERAL OIL PO OIL
TOPICAL_OIL | ORAL | Status: AC
  Filled 2012-11-21: qty 30

## 2012-11-21 MED ORDER — MIDAZOLAM HCL 5 MG/5ML IJ SOLN
INTRAMUSCULAR | Status: DC | PRN
  Administered 2012-11-21: 1 mg via INTRAVENOUS
  Administered 2012-11-21 (×2): 2 mg via INTRAVENOUS

## 2012-11-21 NOTE — Progress Notes (Signed)
IV removed, site WNL.  Pt given d/c instructions and new prescriptions have been called into [facility] in [place] by Dr Ouida Sills.  Both Ouida Sills and [facility] are aware of pt's d/c. Marland Kitchen  Discussed home care with patient and discussed home medications, patient verbalizes understanding, teachback completed. F/U appointment to be made by patient (offices closed at this time)pt states they will keep appointment. Pt is stable at this time. Waiting for pt's wife to bring home O2 for discharge.

## 2012-11-21 NOTE — Op Note (Signed)
[facility] [address] [place], [address]   ENDOSCOPY PROCEDURE REPORT  PATIENT: Connor Armstrong, Connor Armstrong  MR#: [id number] BIRTHDATE: 1950/12/02 , 61  yrs. old GENDER: Male  ENDOSCOPIST: Jonette Eva, MD REFFERED AO:[id number] Juanetta Gosling, M.D.  PROCEDURE DATE:  11/21/2012 PROCEDURE:   EGD with biopsy for H.  pylori, BRUSH BIOPSIES, and EGD with dilatation over guidewire  INDICATIONS:1.  dysphagia.   2.  weight loss. MEDICATIONS: Demerol 50 mg IV and Versed 5 mg IV TOPICAL ANESTHETIC: Cetacaine Spray  DESCRIPTION OF PROCEDURE:   After the risks benefits and alternatives of the procedure were thoroughly explained, informed consent was obtained.  The EG-[id number] ([id number])  endoscope was introduced through the mouth and advanced to the second portion of the duodenum. The instrument was slowly withdrawn as the mucosa was carefully examined.  Prior to withdrawal of the scope, the guidwire was placed.  The esophagus was dilated successfully.  The patient was recovered in endoscopy and discharged home in satisfactory condition.   ESOPHAGUS: WHITE PLAQUES ON THE WALL OF THE ESOPHAGUS THAT DID NOT WASH OFF.  BRUSH BIOPSIES OBTAINED.   A small hiatal hernia was noted.   STOMACH: Moderate non-erosive gastritis (inflammation) was found in the entire examined stomach.  Multiple biopsies were performed using cold forceps.   DUODENUM: The duodenal mucosa showed no abnormalities in the bulb and second portion of the duodenum.  Dilation was then performed at the gastroesphageal junction Dilator: Savary over guidewire Size(s): 12.8, 15-16 MM Resistance: minimal Heme: none  COMPLICATIONS: There were no complications.  ENDOSCOPIC IMPRESSION: 1.   DYSPHAGIA MOST LIKELY DUE TO CANDIDA ESOPHAGITIS 2.   Small hiatal hernia 3.   MODERATE Non-erosive gastritis (inflammation)  RECOMMENDATIONS: AWAIT BIOPSY SOFT MECHANICAL DIET PPI QAM BEFORE BREAKFAST OPV IN 3 MOS W/ DR.   Boyd [place]      _______________________________ Rosalie DoctorJonette Eva, MD 11/21/2012 3:27 PM      PATIENT NAME:  Connor Armstrong, Connor Armstrong MR#: [id number]

## 2012-11-21 NOTE — Progress Notes (Signed)
UR Chart Review Completed  

## 2012-11-21 NOTE — H&P (Signed)
Primary Care Physician:  Fredirick Maudlin, MD Primary Gastroenterologist:  Dr. Darrick Penna  Pre-Procedure History & Physical: HPI:  Connor Armstrong is a 62 y.o. male here for  DYSPHAGIA/Anorexia/weight loss.   Past Medical History  Diagnosis Date  . COPD (chronic obstructive pulmonary disease)   . Hypertension   . Fibromyalgia   . Shingles   . Cancer     kidney    Past Surgical History  Procedure Laterality Date  . Hernia repair    . Thyroid surgery    . Parathyroid surgery      benign tumor  . Partial nephrectomy  2005    right  . Flexible sigmoidoscopy  09/27/2012    WUJ:[id number]    Prior to Admission medications   Medication Sig Start Date End Date Taking? Authorizing Lemon Whitacre  acetaminophen (TYLENOL) 500 MG tablet Take 1,000 mg by mouth every 6 (six) hours as needed. For pain    Yes Historical Eliceo Gladu, MD  albuterol (PROVENTIL HFA;VENTOLIN HFA) 108 (90 BASE) MCG/ACT inhaler Inhale 2 puffs into the lungs every 6 (six) hours as needed. For shortness of breath    Yes Historical Jane Broughton, MD  albuterol (PROVENTIL) (2.5 MG/3ML) 0.083% nebulizer solution Take 2.5 mg by nebulization 3 (three) times daily. 12/14/11 12/13/12 Yes Fredirick Maudlin, MD  aspirin 81 MG chewable tablet Chew 81 mg by mouth daily.   Yes Historical Taite Schoeppner, MD  cyclobenzaprine (FLEXERIL) 10 MG tablet Take 10 mg by mouth 2 (two) times daily.     Yes Historical Brandan Glauber, MD  dextromethorphan-guaiFENesin (MUCINEX DM) 30-600 MG per 12 hr tablet Take 1 tablet by mouth every 12 (twelve) hours as needed. For congestion   Yes Historical Gordon Vandunk, MD  finasteride (PROSCAR) 5 MG tablet Take 5 mg by mouth daily.   Yes Historical Guillermo Difrancesco, MD  Fluticasone-Salmeterol (ADVAIR) 250-50 MCG/DOSE AEPB Inhale 1 puff into the lungs every 12 (twelve) hours.     Yes Historical Sahaana Weitman, MD  gabapentin (NEURONTIN) 300 MG capsule Take 300 mg by mouth at bedtime.   Yes Historical Jax Kentner, MD  Milnacipran (SAVELLA) 50  MG TABS Take 50 mg by mouth 2 (two) times daily.     Yes Historical Danylah Holden, MD  roflumilast (DALIRESP) 500 MCG TABS tablet Take 1 tablet (500 mcg total) by mouth daily. 12/14/11  Yes Fredirick Maudlin, MD  SALINE NASAL MIST NA Place 1-2 sprays into the nose as needed (nasal congestion).   Yes Historical Daryl Quiros, MD  tiotropium (SPIRIVA) 18 MCG inhalation capsule Place 18 mcg into inhaler and inhale daily.     Yes Historical Krosby Ritchie, MD  tadalafil (CIALIS) 5 MG tablet Take 5 mg by mouth daily as needed for erectile dysfunction.     Historical Tikita Mabee, MD    Allergies as of 11/15/2012 - Review Complete 11/15/2012  Allergen Reaction Noted  . Morphine and related Other (See Comments) 11/01/2012    Family History  Problem Relation Age of Onset  . Hypertension Mother   . Colon cancer Paternal Grandmother     History   Social History  . Marital Status: Married    Spouse Name: N/A    Number of Children: N/A  . Years of Education: N/A   Occupational History  . disability    Social History Main Topics  . Smoking status: Former Smoker -- 1.00 packs/day for 42 years    Types: Cigarettes    Quit date: 09/20/2012  . Smokeless tobacco: Not on file  . Alcohol Use: No  Comment: no ETOH in 30 years   . Drug Use: No  . Sexually Active: Not Currently   Other Topics Concern  . Not on file   Social History Narrative  . No narrative on file    Review of Systems: See HPI, otherwise negative ROS   Physical Exam: BP 130/81  Pulse 94  Temp(Src) 97.6 F (36.4 C) (Oral)  Resp 14  Ht 5\' 10"  (1.778 m)  Wt 161 lb 9.6 oz (73.3 kg)  BMI 23.19 kg/m2  SpO2 100% General:   Alert,  pleasant and cooperative in NAD Head:  Normocephalic and atraumatic. Neck:  Supple; Lungs:  Clear throughout to auscultation.    Heart:  Regular rate and rhythm. Abdomen:  Soft, nontender and nondistended. Normal bowel sounds, without guarding, and without rebound.   Neurologic:  Alert and  oriented x4;   grossly normal neurologically.  Impression/Plan:     DYSPHAGIA/WEIGHT LOSS  PLAN:  EGD/DIL TODAY

## 2012-11-22 ENCOUNTER — Telehealth: Payer: Self-pay | Admitting: Gastroenterology

## 2012-11-22 MED ORDER — FLUCONAZOLE 100 MG PO TABS: ORAL_TABLET | ORAL | Status: DC

## 2012-11-22 NOTE — Telephone Encounter (Signed)
Discussed drug interactions with pharmacy.  CALLED PT & spoke to wife. PT NEEDS DIF FOR CANDIDA ESOPHAGITIS. HE HAS DUKES MW AT HOME. EXPLAINED PT SHOULD NOT TAKE CIALIS WITH DIF. DIF & Dalirep together may cause diarrhea, nausea & vomiting. Wife voiced understanding. She will call Dr. Juanetta Gosling if side effects occur. Will call her with biopsy results.

## 2012-11-23 LAB — CULTURE, BLOOD (ROUTINE X 2)
Culture: NO GROWTH
Culture: NO GROWTH

## 2012-11-23 NOTE — Discharge Summary (Signed)
Physician Discharge Summary  Patient ID: Connor Armstrong MRN: [id number] DOB/AGE: Jan 20, 1951 62 y.o. Primary Care Physician:Brogan England L, MD Admit date: 11/15/2012 Discharge date: 11/23/2012    Discharge Diagnoses:   Active Problems:   Hypertension   Fibromyalgia   Acute-on-chronic respiratory failure   COPD with exacerbation   Iron deficiency anemia secondary to blood loss (chronic)  nonerosive gastritis Candida esophagitis    Medication List    TAKE these medications       acetaminophen 500 MG tablet  Commonly known as:  TYLENOL  Take 1,000 mg by mouth every 6 (six) hours as needed. For pain     albuterol 108 (90 BASE) MCG/ACT inhaler  Commonly known as:  PROVENTIL HFA;VENTOLIN HFA  Inhale 2 puffs into the lungs every 6 (six) hours as needed. For shortness of breath     albuterol (2.5 MG/3ML) 0.083% nebulizer solution  Commonly known as:  PROVENTIL  Take 2.5 mg by nebulization 3 (three) times daily.     aspirin 81 MG chewable tablet  Chew 81 mg by mouth daily.     cyclobenzaprine 10 MG tablet  Commonly known as:  FLEXERIL  Take 10 mg by mouth 2 (two) times daily.     dextromethorphan-guaiFENesin 30-600 MG per 12 hr tablet  Commonly known as:  MUCINEX DM  Take 1 tablet by mouth every 12 (twelve) hours as needed. For congestion     finasteride 5 MG tablet  Commonly known as:  PROSCAR  Take 5 mg by mouth daily.     Fluticasone-Salmeterol 250-50 MCG/DOSE Aepb  Commonly known as:  ADVAIR  Inhale 1 puff into the lungs every 12 (twelve) hours.     gabapentin 300 MG capsule  Commonly known as:  NEURONTIN  Take 300 mg by mouth at bedtime.     Milnacipran 50 MG Tabs  Commonly known as:  SAVELLA  Take 50 mg by mouth 2 (two) times daily.     pantoprazole 40 MG tablet  Commonly known as:  PROTONIX  Take 1 tablet (40 mg total) by mouth daily.     predniSONE 10 MG tablet  Commonly known as:  STERAPRED UNI-PAK  Take 2 tablets (20 mg total) by mouth daily.      roflumilast 500 MCG Tabs tablet  Commonly known as:  DALIRESP  Take 1 tablet (500 mcg total) by mouth daily.     SALINE NASAL MIST NA  Place 1-2 sprays into the nose as needed (nasal congestion).     tadalafil 5 MG tablet  Commonly known as:  CIALIS  Take 5 mg by mouth daily as needed for erectile dysfunction.     tiotropium 18 MCG inhalation capsule  Commonly known as:  SPIRIVA  Place 18 mcg into inhaler and inhale daily.        Discharged Condition: Improved    Consults: Gastroenterology  Significant Diagnostic Studies: Ct Chest W Contrast  11/17/2012  *RADIOLOGY REPORT*  Clinical Data: Difficulty breathing.  COPD.  Pneumonia.  Fever. History of right renal cancer with partial nephrectomy.  CT CHEST WITH CONTRAST  Technique:  Multidetector CT imaging of the chest was performed following the standard protocol during bolus administration of intravenous contrast.  Contrast: 80mL OMNIPAQUE IOHEXOL 300 MG/ML  SOLN  Comparison: Chest radiograph 11/16/2012.  Chest CT 11/01/2012.  Findings: There is no axillary adenopathy.  No supraclavicular adenopathy. Borderline AP window lymph node is present (image number 27 series 2), measuring 1 cm short axis.  This AP window node  is similar to the prior exam of 11/01/2012.  No hilar adenopathy is present.  Pulmonary arterial hypertension is present associated with COPD. There is persistent lingular nodularity although in general, the mass-like opacification of the left upper lobe has decreased compared to prior.  The linear consolidation adjacent to the major fissure on the left has decreased in size.  Lingular opacity abutting the pericardium has also decreased in size with persistent nodularity.  Overall findings suggest resolving pneumonia with post infectious/inflammatory changes.  The delay in resolution may be due to poor pulmonary toilet in this patient with severe emphysema. Severe apical bullous emphysema is present.  No pneumothorax.   Incidental imaging of the upper abdomen shows a dysmorphic appearance of the right upper renal pole, probably secondary to partial nephrectomy.  Surgical clips are present in the right retroperitoneum.  No effusion is present in the chest.  Surgical clips in the right thyroid bed. Central airways appear patent.  Tiny amount of anterior pericardial fluid or thickening is unchanged.  IMPRESSION: 1.  Improving bilateral mass-like consolidation, most prominent in the left upper lobe and lingula.  Findings suggest slowly resolving pneumonia with post infectious/inflammatory changes.  Continued follow-up CT and 2-3 months is recommended for further assessment, which was suggested in the prior examination.  Atypical infection is considered less likely based on the improvement. 2.  Borderline 1 cm AP window lymph node, probably unchanged from recent prior exam.  Attention on follow-up recommended. 3.  Severe bullous emphysema.   Original Report Authenticated By: Andreas Newport, M.D.    Ct Angio Chest Pe W/cm &/or Wo Cm  11/01/2012  *RADIOLOGY REPORT*  Clinical Data: Shortness of breath.  Hypoxia.  CT ANGIOGRAPHY CHEST  Technique:  Multidetector CT imaging of the chest using the standard protocol during bolus administration of intravenous contrast. Multiplanar reconstructed images including MIPs were obtained and reviewed to evaluate the vascular anatomy.  Contrast: OMNIPAQUE IOHEXOL 350 MG/ML SOLN  Comparison: Chest CT 05/24/2004.  Findings:  Mediastinum: No filling defects within the pulmonary arterial tree to suggest underlying pulmonary embolism. Heart size is normal. A small amount of pericardial fluid and/or thickening, unlikely to be of hemodynamic significance at this time.  No associated pericardial calcification.  There are numerous borderline enlarged mediastinal and bilateral hilar lymph nodes, largest of which measures up to 9 mm in short axis in the AP window.  These are presumably reactive.   Postoperative changes of a right hemithyroidectomy.  The esophagus is unremarkable in appearance.  Lungs/Pleura: There is severe centrilobular and paraseptal emphysema, with numerous bilateral bullae and blebs, most pronounced at the apices of the lungs bilaterally.  There are numerous nodular masslike airspace opacities in the lungs bilaterally, particularly in the periphery of the left upper lobe. These are favored to be infectious, however, some of them are somewhat solid in appearance and warrant close attention on follow- up studies, including a 13 mm nodule with spiculated margins in the left upper lobe (image 66 of series 5) and a 9 mm nodule with spiculated margins in the medial segment of the right middle lobe (image 61 of series 5).  Mild diffuse bronchial wall thickening. No pleural effusions.  Upper Abdomen: Surgical clips in the upper right retroperitoneum.  Musculoskeletal: There are no aggressive appearing lytic or blastic lesions noted in the visualized portions of the skeleton.  IMPRESSION: 1.  No evidence of pulmonary embolism. 2.  Multiple nodular and mass like areas of air space opacification throughout the lungs bilaterally.  The overall appearance is favored to represent a multi focal atypical infectious process, potentially even fungal pneumonia.  Clinical correlation is recommended.  The possibility of neoplasm is not excluded, and accordingly, close attention on short-term repeat chest CT in 3 months is recommended. 3.  Diffuse bronchial wall thickening with severe centrilobular paraseptal emphysema; imaging findings compatible with advanced COPD. 4.  Small amount of anterior pericardial fluid and/or thickening, unlikely to be of hemodynamic significance at this time.   Original Report Authenticated By: Trudie Reed, M.D.    Dg Chest Portable 1 View  11/16/2012  *RADIOLOGY REPORT*  Clinical Data: Short of breath  PORTABLE CHEST - 1 VIEW  Comparison: Prior chest x-ray 11/03/2012; prior  chest CT 11/01/2012  Findings: Background of severe emphysema and areas of fibrotic change are again noted.  No pneumothorax.  Slightly improved nodular opacity in the lingula compared to prior.  Surgical clips noted in the soft tissues of the right upper neck.  There may be slightly increased opacity in the bilateral bases.  IMPRESSION: Slightly increased opacity in the bilateral bases may reflect atelectasis, or infiltrate superimposed on severe background emphysema and fibrotic change.   Original Report Authenticated By: Malachy Moan, M.D.    Dg Chest Port 1 View  11/03/2012  *RADIOLOGY REPORT*  Clinical Data: Confirm PICC line placement  PORTABLE CHEST - 1 VIEW  Comparison: CT chest dated 11/01/2012  Findings: Right arm PICC terminates at the cavoatrial junction.  Chronic interstitial markings/emphysematous changes.  Patchy lingular opacity, worrisome for pneumonia.  No pleural effusion or pneumothorax.  Surgical clips in the right neck.  IMPRESSION: Right arm PICC terminates at the cavoatrial junction.   Original Report Authenticated By: Charline Bills, M.D.    Dg Chest Portable 1 View  11/01/2012  *RADIOLOGY REPORT*  Clinical Data: Shortness of breath.  PORTABLE CHEST - 1 VIEW  Comparison: 09/25/2012.  Findings: The cardiac silhouette, mediastinal and hilar contours are stable.  Stable underlying emphysematous changes with apical bulla.  Suspect asymmetric perihilar pulmonary edema.  No pleural effusion.  IMPRESSION: Severe chronic emphysematous changes with suspected overlying asymmetric perihilar edema.   Original Report Authenticated By: Rudie Meyer, M.D.     Lab Results: Basic Metabolic Panel: No results found for this basename: NA, K, CL, CO2, GLUCOSE, BUN, CREATININE, CALCIUM, MG, PHOS,  in the last 72 hours Liver Function Tests: No results found for this basename: AST, ALT, ALKPHOS, BILITOT, PROT, ALBUMIN,  in the last 72 hours   CBC: No results found for this basename: WBC,  NEUTROABS, HGB, HCT, MCV, PLT,  in the last 72 hours  Recent Results (from the past 240 hour(s))  MRSA PCR SCREENING     Status: None   Collection Time    11/16/12  4:15 AM      Result Value Range Status   MRSA by PCR NEGATIVE  NEGATIVE Final   Comment:            The GeneXpert MRSA Assay (FDA     approved for NASAL specimens     only), is one component of a     comprehensive MRSA colonization     surveillance program. It is not     intended to diagnose MRSA     infection nor to guide or     monitor treatment for     MRSA infections.  CULTURE, BLOOD (ROUTINE X 2)     Status: None   Collection Time    11/18/12  7:59 AM  Result Value Range Status   Specimen Description BLOOD LEFT HAND   Final   Special Requests BOTTLES DRAWN AEROBIC AND ANAEROBIC 6CC   Final   Culture NO GROWTH 5 DAYS   Final   Report Status 11/23/2012 FINAL   Final  CULTURE, BLOOD (ROUTINE X 2)     Status: None   Collection Time    11/18/12  8:04 AM      Result Value Range Status   Specimen Description BLOOD RIGHT ARM   Final   Special Requests BOTTLES DRAWN AEROBIC AND ANAEROBIC 6CC   Final   Culture NO GROWTH 5 DAYS   Final   Report Status 11/23/2012 FINAL   Final  URINE CULTURE     Status: None   Collection Time    11/18/12  8:51 AM      Result Value Range Status   Specimen Description URINE, CLEAN CATCH   Final   Special Requests Normal   Final   Culture  Setup Time 11/18/2012 16:32   Final   Colony Count NO GROWTH   Final   Culture NO GROWTH   Final   Report Status 11/19/2012 FINAL   Final  KOH PREP     Status: None   Collection Time    11/21/12 12:40 PM      Result Value Range Status   Specimen Description ESOPHAGUS   Final   Special Requests NONE   Final   KOH Prep FEW YEAST   Final   Report Status 11/21/2012 FINAL   Final     Hospital Course: He was admitted with COPD exacerbation. He had been in his usual state of poor health at home when he developed increasing shortness of breath.  He came to the emergency room and was found to have infiltrates on chest x-ray and there was concern that this might be worsening of a previously noted pneumonia. CT was done and that showed that the pneumonia appeared to be improving. Antibiotics were discontinued. He was placed on prednisone. He was more anemic than previously and had a stool that was positive for blood. Anemia profile showed iron deficiency. GI consult was obtained and he underwent EGD on the 14th which showed what appeared to be Candida esophagitis a small hiatal hernia and moderate nonerosive gastritis. After he recovered from the EGD he was discharged home in markedly improved condition  Discharge Exam: Blood pressure 127/71, pulse 93, temperature 97.4 F (36.3 C), temperature source Oral, resp. rate 18, height 5\' 10"  (1.778 m), weight 73.3 kg (161 lb 9.6 oz), SpO2 94.00%. He is awake and alert. His chest is clear. His heart is regular. He has diminished breath sounds.  Disposition: Home with home health services        Follow-up Information   Follow up with Jonette Eva, MD In 3 months. ([facility] [place])    Contact information:   [address] [place] [address] [phone]       Follow up with Blossom Crume L, MD In 1 week.   Contact information:   [address] [place] [address] [phone]       Signed: Fredirick Maudlin Pager [phone]  11/23/2012, 9:57 AM

## 2012-11-24 ENCOUNTER — Telehealth: Payer: Self-pay | Admitting: Gastroenterology

## 2012-11-24 NOTE — Telephone Encounter (Signed)
Patient needs routine outpatient appt to discuss colonoscopy. Hospital f/u. Non-urgent, next 6-8 weeks.

## 2012-11-26 ENCOUNTER — Encounter (HOSPITAL_COMMUNITY): Payer: Self-pay | Admitting: Gastroenterology

## 2012-12-02 ENCOUNTER — Encounter: Payer: Self-pay | Admitting: Gastroenterology

## 2012-12-02 LAB — EXPECTORATED SPUTUM ASSESSMENT W GRAM STAIN, RFLX TO RESP C

## 2012-12-02 NOTE — Telephone Encounter (Signed)
Pt is aware of OV on 5/6 at 1030 with [facility] and appt card was mailed

## 2012-12-10 ENCOUNTER — Telehealth: Payer: Self-pay | Admitting: Gastroenterology

## 2012-12-10 NOTE — Telephone Encounter (Signed)
Called patient TO DISCUSS RESULTS. LVM-CALL [phone] TO DISCUSS.    

## 2012-12-10 NOTE — Telephone Encounter (Signed)
HIS stomach Bx showed H. Pylori infection. He needs AMOXICILLIN 500 mg 2 po BID for 10 days and Biaxin 500 mg po bid for 10 days. TAKE PROTONIX TWICE DAILY FRO 3 MOS THEN DAILY. Med side effects include NVD, abd pain, and metallic taste. OPV JUN 2014 DX: GASTRITIS, DYSPHAGIA E30  MEDS REVIEWED WITH PHARMACY([facility]). HOLD CIALIS WHILE TAKING ABX. MAY INCREASE LEVELS OF DALIRESP. AGREED PT SHOULD TAKE DAILRESP QOD WHILE TAKING BIAXIN. START ABX AFTER DIFLUCAN RX COMPLETE.

## 2012-12-11 NOTE — Telephone Encounter (Signed)
Pt returned call and was informed of results and all of the info. Called Rx's to [place] at [facility].

## 2013-01-08 ENCOUNTER — Encounter: Payer: Self-pay | Admitting: Gastroenterology

## 2013-01-08 ENCOUNTER — Encounter (HOSPITAL_COMMUNITY)
Admission: RE | Admit: 2013-01-08 | Discharge: 2013-01-08 | Disposition: A | Discharge status: Home / Self Care | Payer: Medicare Other | Source: Ambulatory Visit | Attending: Pulmonary Disease | Admitting: Pulmonary Disease

## 2013-01-08 VITALS — BP 140/82 | HR 89 | Ht 70.0 in | Wt 153.3 lb

## 2013-01-08 DIAGNOSIS — J449 Chronic obstructive pulmonary disease, unspecified: Secondary | ICD-10-CM

## 2013-01-08 DIAGNOSIS — Z5189 Encounter for other specified aftercare: Secondary | ICD-10-CM | POA: Insufficient documentation

## 2013-01-08 DIAGNOSIS — J4489 Other specified chronic obstructive pulmonary disease: Secondary | ICD-10-CM | POA: Insufficient documentation

## 2013-01-08 NOTE — Progress Notes (Signed)
Patient referred to Pulmonary Rehab by Dr. Juanetta Gosling due to COPD 496. During orientation advised patient on arrival and appointment times what to wear, what to do before, during and after exercise. Reviewed attendance and class policy. Talked about inclement weather and class consultation policy. Pt is scheduled to start Pulmonary Rehab on 01/12/13 at 1 pm. Pt was advised to come to class 5 minutes before class starts. He was also given instructions on meeting with the dietician and attending the Family Structure classes. Pt is eager to get started. Patient did the 6 minute pre-walk test. Discussed purse-lip breathing and demonstrated how to perform the breathing.

## 2013-01-08 NOTE — Patient Instructions (Addendum)
Pt has finished orientation and is scheduled to start PR on 01/12/13 at 1 pm. Pt has been instructed to arrive to class 15 minutes early for scheduled class. Pt has been instructed to wear comfortable clothing and shoes with rubber soles. Pt has been told to take their medications 1 hour prior to coming to class.  If the patient is not going to attend class, he/she has been instructed to call.

## 2013-01-12 ENCOUNTER — Encounter (HOSPITAL_COMMUNITY)
Admission: RE | Admit: 2013-01-12 | Discharge: 2013-01-12 | Disposition: A | Discharge status: Home / Self Care | Payer: Medicare Other | Source: Ambulatory Visit | Attending: Pulmonary Disease | Admitting: Pulmonary Disease

## 2013-01-13 ENCOUNTER — Encounter: Payer: Self-pay | Admitting: Gastroenterology

## 2013-01-13 ENCOUNTER — Ambulatory Visit (INDEPENDENT_AMBULATORY_CARE_PROVIDER_SITE_OTHER): Payer: Medicare Other | Admitting: Gastroenterology

## 2013-01-13 ENCOUNTER — Encounter (HOSPITAL_COMMUNITY): Payer: Self-pay | Admitting: Pharmacy Technician

## 2013-01-13 VITALS — BP 139/78 | HR 86 | Temp 97.8°F | Ht 70.0 in | Wt 157.6 lb

## 2013-01-13 DIAGNOSIS — Z1211 Encounter for screening for malignant neoplasm of colon: Secondary | ICD-10-CM

## 2013-01-13 DIAGNOSIS — D649 Anemia, unspecified: Secondary | ICD-10-CM

## 2013-01-13 MED ORDER — PEG 3350-KCL-NA BICARB-NACL 420 G PO SOLR: 4000.0000 mL | ORAL | Status: DC

## 2013-01-13 NOTE — Assessment & Plan Note (Signed)
Likely multifactorial, with candida esophagitis and H.pylori gastritis as contributors, documented via EGD this year. Dysphagia significantly improved. Ultimately needs H.pylori stool antigen to document eradication at some point; will wait on this for now but plan on doing in next few months. Recheck CBC now. Colonoscopy planned for screening maneuver.

## 2013-01-13 NOTE — Assessment & Plan Note (Signed)
62 year old male with history of colonic ileus at the beginning of this year, requiring hospitalization. At that time, flex sig performed by Dr. Darrick Penna. He has never had a complete lower GI tract evaluation. Chronic constipation noted, taking Miralax. No rectal bleeding. May benefit from Linzess or Amitiza after colonoscopy completed.   Proceed with colonoscopy with Dr. Darrick Penna in the near future. The risks, benefits, and alternatives have been discussed in detail with the patient. They state understanding and desire to proceed.  Consider Linzess or Amitiza after colonoscopy

## 2013-01-13 NOTE — Patient Instructions (Addendum)
Please complete blood work. We will call you with the results.  We have scheduled you for a colonoscopy with Dr. Darrick Penna in the near future.

## 2013-01-13 NOTE — Progress Notes (Signed)
Cc PCP 

## 2013-01-13 NOTE — Progress Notes (Signed)
Referring Provider: Fredirick Maudlin, MD Primary Care Physician:  Fredirick Maudlin, MD Primary GI: Dr. Darrick Penna   Chief Complaint  Patient presents with  . Follow-up    HPI:   [name] is a 62 year old male well-known to our practice secondary to several hospitalizations. Jan 2014 was seen inpatient due to colonic illness, with flex sig by Dr. Darrick Penna noting diverticulosis. He was readmitted in march 2014 with acute on chronic anemia, heme positive stools, IDA. EGD showed candida esophagitis and H.pylori gastritis. He presents today to discuss screening colonoscopy.  Notes occasional pain mid-chest with eating, but overall symptoms of dysphagia have significantly improved. Takes Miralax for constipation, with full effects about 3 days after taking. No abdominal pain, no weight loss, +weight gain. Notes bowel movement usually daily, but the most productive one is 3 days after dose of Miralax.    Past Medical History  Diagnosis Date  . COPD (chronic obstructive pulmonary disease)   . Hypertension   . Fibromyalgia   . Shingles   . Cancer     kidney    Past Surgical History  Procedure Laterality Date  . Hernia repair    . Thyroid surgery    . Parathyroid surgery      benign tumor  . Partial nephrectomy  2005    right  . Flexible sigmoidoscopy  09/27/2012    WUJ:[id number]  . Esophagogastroduodenoscopy (egd) with esophageal dilation N/A 11/21/2012    SLF: DYSPHAGIA MOST LIKELY DUE TO CANDIDA ESOPHAGITIS/Small hiatal hernia/ MODERATE Non-erosive gastritis     Current Outpatient Prescriptions  Medication Sig Dispense Refill  . acetaminophen (TYLENOL) 500 MG tablet Take 1,000 mg by mouth every 6 (six) hours as needed. For pain       . albuterol (PROVENTIL HFA;VENTOLIN HFA) 108 (90 BASE) MCG/ACT inhaler Inhale 2 puffs into the lungs every 6 (six) hours as needed. For shortness of breath       . aspirin 81 MG chewable tablet Chew 81 mg by mouth daily.      .  cyclobenzaprine (FLEXERIL) 10 MG tablet Take 10 mg by mouth 2 (two) times daily.        Marland Kitchen dextromethorphan-guaiFENesin (MUCINEX DM) 30-600 MG per 12 hr tablet Take 1 tablet by mouth every 12 (twelve) hours as needed. For congestion      . finasteride (PROSCAR) 5 MG tablet Take 5 mg by mouth daily.      . Fluticasone-Salmeterol (ADVAIR) 250-50 MCG/DOSE AEPB Inhale 1 puff into the lungs every 12 (twelve) hours.        . gabapentin (NEURONTIN) 300 MG capsule Take 300 mg by mouth at bedtime.      . Milnacipran (SAVELLA) 50 MG TABS Take 50 mg by mouth 2 (two) times daily.        . pantoprazole (PROTONIX) 40 MG tablet Take 1 tablet (40 mg total) by mouth daily.  30 tablet  2  . polyethylene glycol (MIRALAX / GLYCOLAX) packet Take 17 g by mouth daily.      . predniSONE (STERAPRED UNI-PAK) 10 MG tablet Take 2 tablets (20 mg total) by mouth daily.  20 tablet  0  . roflumilast (DALIRESP) 500 MCG TABS tablet Take 1 tablet (500 mcg total) by mouth daily.  30 tablet  12  . SALINE NASAL MIST NA Place 1-2 sprays into the nose as needed (nasal congestion).      . tadalafil (CIALIS) 5 MG tablet Take 5 mg by mouth daily as needed for erectile dysfunction.       Marland Kitchen  tiotropium (SPIRIVA) 18 MCG inhalation capsule Place 18 mcg into inhaler and inhale daily.        Marland Kitchen albuterol (PROVENTIL) (2.5 MG/3ML) 0.083% nebulizer solution Take 2.5 mg by nebulization 3 (three) times daily.      . fluconazole (DIFLUCAN) 100 MG tablet 2 PO TODAY THEN 1 PO DAILY FOR 20 DAYS. DO NOT TAKE CIALIS WITH DIFLUCAN.  22 tablet  0   No current facility-administered medications for this visit.    Allergies as of 01/13/2013 - Review Complete 01/13/2013  Allergen Reaction Noted  . Morphine and related Other (See Comments) 11/01/2012    Family History  Problem Relation Age of Onset  . Hypertension Mother   . Colon cancer Paternal Grandmother     History   Social History  . Marital Status: Married    Spouse Name: N/A    Number of  Children: N/A  . Years of Education: N/A   Occupational History  . disability    Social History Main Topics  . Smoking status: Former Smoker -- 1.00 packs/day for 42 years    Types: Cigarettes    Quit date: 09/20/2012  . Smokeless tobacco: None  . Alcohol Use: No     Comment: no ETOH in 30 years   . Drug Use: No  . Sexually Active: Not Currently   Other Topics Concern  . None   Social History Narrative  . None    Review of Systems: Gen: Denies fever, chills, anorexia. Denies fatigue, weakness, weight loss Resp: +SOB, +DOE GI: SEE HPI Derm: Denies rash, itching, dry skin Psych: Denies depression, anxiety, memory loss, confusion. No homicidal or suicidal ideation.  Heme: Denies bruising, bleeding, and enlarged lymph nodes.  Physical Exam: BP 139/78  Pulse 86  Temp(Src) 97.8 F (36.6 C) (Oral)  Ht 5\' 10"  (1.778 m)  Wt 157 lb 9.6 oz (71.487 kg)  BMI 22.61 kg/m2 General:   Alert and oriented. No distress noted. Pleasant and cooperative. Appears chronically ill.  Head:  Normocephalic and atraumatic. Eyes:  Conjuctiva clear without scleral icterus. Mouth:  Oral mucosa pink and moist. Good dentition. No lesions. Neck:  Supple, without mass or thyromegaly. Heart:  S1, S2 present without murmurs, rubs, or gallops. Regular rate and rhythm. Lungs: coarse bilaterally, diminished in bases, expiratory wheeze. On 2 liters nasal cannula.  Abdomen:  +BS, soft, non-tender and non-distended. No rebound or guarding. No HSM or masses noted. Msk:  Symmetrical without gross deformities. Normal posture. Extremities:  Without edema. Neurologic:  Alert and  oriented x4;  grossly normal neurologically. Skin:  Intact without significant lesions or rashes. Cervical Nodes:  No significant cervical adenopathy. Psych:  Alert and cooperative. Normal mood and affect.

## 2013-01-14 ENCOUNTER — Encounter (HOSPITAL_COMMUNITY)
Admission: RE | Admit: 2013-01-14 | Discharge: 2013-01-14 | Disposition: A | Discharge status: Home / Self Care | Payer: Medicare Other | Source: Ambulatory Visit | Attending: Pulmonary Disease | Admitting: Pulmonary Disease

## 2013-01-16 LAB — CBC WITH DIFFERENTIAL/PLATELET
Basophils Absolute: 0.1 10*3/uL (ref 0.0–0.1)
Basophils Relative: 0 % (ref 0–1)
Eosinophils Relative: 3 % (ref 0–5)
HCT: 33.9 % — ABNORMAL LOW (ref 39.0–52.0)
MCH: 23 pg — ABNORMAL LOW (ref 26.0–34.0)
MCHC: 29.8 g/dL — ABNORMAL LOW (ref 30.0–36.0)
MCV: 77.2 fL — ABNORMAL LOW (ref 78.0–100.0)
Monocytes Absolute: 0.6 10*3/uL (ref 0.1–1.0)
RDW: 17 % — ABNORMAL HIGH (ref 11.5–15.5)

## 2013-01-19 ENCOUNTER — Encounter (HOSPITAL_COMMUNITY): Payer: Medicare Other

## 2013-01-20 ENCOUNTER — Encounter (HOSPITAL_COMMUNITY): Payer: Self-pay | Admitting: *Deleted

## 2013-01-20 ENCOUNTER — Telehealth: Payer: Self-pay | Admitting: Gastroenterology

## 2013-01-20 ENCOUNTER — Ambulatory Visit (HOSPITAL_COMMUNITY)
Admission: RE | Admit: 2013-01-20 | Discharge: 2013-01-20 | Disposition: A | Discharge status: Home / Self Care | Payer: Medicare Other | Source: Ambulatory Visit | Attending: Gastroenterology | Admitting: Gastroenterology

## 2013-01-20 ENCOUNTER — Other Ambulatory Visit: Payer: Self-pay | Admitting: Gastroenterology

## 2013-01-20 ENCOUNTER — Encounter (HOSPITAL_COMMUNITY)
Admission: RE | Disposition: A | Discharge status: Home / Self Care | Payer: Self-pay | Source: Ambulatory Visit | Attending: Gastroenterology

## 2013-01-20 DIAGNOSIS — K648 Other hemorrhoids: Secondary | ICD-10-CM

## 2013-01-20 DIAGNOSIS — Z1211 Encounter for screening for malignant neoplasm of colon: Secondary | ICD-10-CM | POA: Insufficient documentation

## 2013-01-20 DIAGNOSIS — D126 Benign neoplasm of colon, unspecified: Secondary | ICD-10-CM

## 2013-01-20 DIAGNOSIS — D128 Benign neoplasm of rectum: Secondary | ICD-10-CM | POA: Insufficient documentation

## 2013-01-20 DIAGNOSIS — I10 Essential (primary) hypertension: Secondary | ICD-10-CM | POA: Insufficient documentation

## 2013-01-20 DIAGNOSIS — J4489 Other specified chronic obstructive pulmonary disease: Secondary | ICD-10-CM | POA: Insufficient documentation

## 2013-01-20 DIAGNOSIS — J449 Chronic obstructive pulmonary disease, unspecified: Secondary | ICD-10-CM | POA: Insufficient documentation

## 2013-01-20 DIAGNOSIS — K5730 Diverticulosis of large intestine without perforation or abscess without bleeding: Secondary | ICD-10-CM | POA: Insufficient documentation

## 2013-01-20 HISTORY — PX: COLONOSCOPY: SHX5424

## 2013-01-20 SURGERY — COLONOSCOPY
Anesthesia: Moderate Sedation

## 2013-01-20 MED ORDER — MINERAL OIL PO OIL
TOPICAL_OIL | ORAL | Status: AC
  Filled 2013-01-20: qty 30

## 2013-01-20 MED ORDER — MIDAZOLAM HCL 5 MG/5ML IJ SOLN
INTRAMUSCULAR | Status: AC
  Filled 2013-01-20: qty 10

## 2013-01-20 MED ORDER — MEPERIDINE HCL 100 MG/ML IJ SOLN
INTRAMUSCULAR | Status: DC | PRN
  Administered 2013-01-20 (×4): 25 mg via INTRAVENOUS

## 2013-01-20 MED ORDER — SPOT INK MARKER SYRINGE KIT
PACK | SUBMUCOSAL | Status: DC | PRN
  Administered 2013-01-20: 1 mL via SUBMUCOSAL

## 2013-01-20 MED ORDER — EPINEPHRINE HCL 0.1 MG/ML IJ SOSY
PREFILLED_SYRINGE | INTRAMUSCULAR | Status: AC
  Filled 2013-01-20: qty 10

## 2013-01-20 MED ORDER — NALOXONE HCL 0.4 MG/ML IJ SOLN
INTRAMUSCULAR | Status: DC | PRN
  Administered 2013-01-20 (×2): 0.4 mg via INTRAVENOUS

## 2013-01-20 MED ORDER — STERILE WATER FOR IRRIGATION IR SOLN
Status: DC | PRN
  Administered 2013-01-20: 09:00:00

## 2013-01-20 MED ORDER — SODIUM CHLORIDE 0.9 % IV SOLN
INTRAVENOUS | Status: DC
  Administered 2013-01-20: 09:00:00 via INTRAVENOUS

## 2013-01-20 MED ORDER — MIDAZOLAM HCL 5 MG/5ML IJ SOLN
INTRAMUSCULAR | Status: DC | PRN
  Administered 2013-01-20: 1 mg via INTRAVENOUS
  Administered 2013-01-20: 2 mg via INTRAVENOUS
  Administered 2013-01-20 (×2): 1 mg via INTRAVENOUS
  Administered 2013-01-20: 2 mg via INTRAVENOUS

## 2013-01-20 MED ORDER — SODIUM CHLORIDE 0.9 % IJ SOLN
PREFILLED_SYRINGE | INTRAMUSCULAR | Status: DC | PRN
  Administered 2013-01-20: 11:00:00

## 2013-01-20 MED ORDER — MEPERIDINE HCL 100 MG/ML IJ SOLN
INTRAMUSCULAR | Status: AC
  Filled 2013-01-20: qty 2

## 2013-01-20 NOTE — H&P (Addendum)
Primary Care Physician:  HAWKINS,EDWARD L, MD Primary Gastroenterologist:  Dr. Fields  Pre-Procedure History & Physical: HPI:  Connor Armstrong is a 62 y.o. male here for COLON CANCER SCREENING/dysphagia-PMHx: CANDIDA ESOPHAGITIS/H PYLORI.   Past Medical History  Diagnosis Date  . COPD (chronic obstructive pulmonary disease)   . Hypertension   . Fibromyalgia   . Shingles   . Cancer     kidney  . Helicobacter pylori gastritis March 2014  . Candida esophagitis March 2014    Past Surgical History  Procedure Laterality Date  . Hernia repair    . Thyroid surgery    . Parathyroid surgery      benign tumor  . Partial nephrectomy  2005    right  . Flexible sigmoidoscopy  09/27/2012    SLF:Diverticulosis/Hemorrhoids  . Esophagogastroduodenoscopy (egd) with esophageal dilation N/A 11/21/2012    SLF: DYSPHAGIA MOST LIKELY DUE TO CANDIDA ESOPHAGITIS/Small hiatal hernia/ MODERATE Non-erosive gastritis with +H.PYLORI ON PATH    Prior to Admission medications   Medication Sig Start Date End Date Taking? Authorizing Provider  acetaminophen (TYLENOL) 500 MG tablet Take 1,000 mg by mouth every 6 (six) hours as needed. For pain    Yes Historical Provider, MD  albuterol (PROVENTIL HFA;VENTOLIN HFA) 108 (90 BASE) MCG/ACT inhaler Inhale 2 puffs into the lungs every 6 (six) hours as needed. For shortness of breath    Yes Historical Provider, MD  albuterol (PROVENTIL) (2.5 MG/3ML) 0.083% nebulizer solution Take 2.5 mg by nebulization 3 (three) times daily. 12/14/11  Yes Edward L Hawkins, MD  aspirin 81 MG chewable tablet Chew 81 mg by mouth daily.   Yes Historical Provider, MD  cyclobenzaprine (FLEXERIL) 10 MG tablet Take 10 mg by mouth 2 (two) times daily.     Yes Historical Provider, MD  dextromethorphan-guaiFENesin (MUCINEX DM) 30-600 MG per 12 hr tablet Take 1 tablet by mouth every 12 (twelve) hours as needed. For congestion   Yes Historical Provider, MD  finasteride (PROSCAR) 5 MG tablet Take 5  mg by mouth daily.   Yes Historical Provider, MD  Fluticasone-Salmeterol (ADVAIR) 250-50 MCG/DOSE AEPB Inhale 1 puff into the lungs every 12 (twelve) hours.     Yes Historical Provider, MD  furosemide (LASIX) 40 MG tablet Take 40 mg by mouth daily.   Yes Historical Provider, MD  gabapentin (NEURONTIN) 300 MG capsule Take 300 mg by mouth at bedtime.   Yes Historical Provider, MD  Milnacipran (SAVELLA) 50 MG TABS Take 50 mg by mouth 2 (two) times daily.     Yes Historical Provider, MD  pantoprazole (PROTONIX) 40 MG tablet Take 1 tablet (40 mg total) by mouth daily. 11/21/12  Yes Roy Fagan, MD  polyethylene glycol (MIRALAX / GLYCOLAX) packet Take 17 g by mouth daily.   Yes Historical Provider, MD  polyethylene glycol-electrolytes (TRILYTE) 420 G solution Take 4,000 mLs by mouth as directed. 01/13/13  Yes Sandi L Fields, MD  roflumilast (DALIRESP) 500 MCG TABS tablet Take 1 tablet (500 mcg total) by mouth daily. 12/14/11  Yes Edward L Hawkins, MD  tiotropium (SPIRIVA) 18 MCG inhalation capsule Place 18 mcg into inhaler and inhale daily.     Yes Historical Provider, MD  SALINE NASAL MIST NA Place 1-2 sprays into the nose as needed (nasal congestion).    Historical Provider, MD  tadalafil (CIALIS) 5 MG tablet Take 5 mg by mouth daily as needed for erectile dysfunction.     Historical Provider, MD    Allergies as of 01/13/2013 -   Review Complete 01/13/2013  Allergen Reaction Noted  . Morphine and related Other (See Comments) 11/01/2012    Family History  Problem Relation Age of Onset  . Hypertension Mother   . Colon cancer Paternal Grandmother     History   Social History  . Marital Status: Married    Spouse Name: N/A    Number of Children: N/A  . Years of Education: N/A   Occupational History  . disability    Social History Main Topics  . Smoking status: Former Smoker -- 1.00 packs/day for 42 years    Types: Cigarettes    Quit date: 09/20/2012  . Smokeless tobacco: Not on file  . Alcohol  Use: No     Comment: no ETOH in 30 years   . Drug Use: No  . Sexually Active: Not Currently   Other Topics Concern  . Not on file   Social History Narrative  . No narrative on file    Review of Systems: See HPI, otherwise negative ROS   Physical Exam: BP 154/99  Pulse 106  Temp(Src) 97.7 F (36.5 C) (Oral)  Resp 24  Ht 5' 10" (1.778 m)  Wt 157 lb (71.215 kg)  BMI 22.53 kg/m2  SpO2 99% General:   Alert,  pleasant and cooperative in NAD Head:  Normocephalic and atraumatic. Neck:  Supple; Lungs:  Clear throughout to auscultation.    Heart:  Regular rate and rhythm. Abdomen:  Soft, nontender and nondistended. Normal bowel sounds, without guarding, and without rebound.   Neurologic:  Alert and  oriented x4;  grossly normal neurologically.  Impression/Plan:     SCREENING/DYSPHAGIA  Plan:  1. TCS/EGD/DIL TODAY 

## 2013-01-20 NOTE — OR Nursing (Signed)
Patient arrived to Short Stay, complained of shortness of breath. Nursing staff notified by Loraine Leriche in registration of patient's complaints. Patient checked his oxygen saturation with his home device and stated it read 76% on 3 liters oxygen. Patient brought to pre-op and placed on our monitor, O2 saturation 94%. Patient also complains of a rash on his face since drinking his prep. Dr. Darrick Penna notified of the above. No further orders received. Patient resting on stretcher. O2 saturation 100% on 3 liters O2 via nasal cannula. Will continue to monitor.

## 2013-01-20 NOTE — Progress Notes (Signed)
To post phase 2 rm 3 in good condition.

## 2013-01-20 NOTE — Telephone Encounter (Signed)
Patient is scheduled for EGD/ED in the OR on Tuesday May 20th and I have [facility] for patient to call me back to confirm instructions have been mailed, Pre Op appointment is scheduled for Friday May 16th at 10:00

## 2013-01-20 NOTE — OR Nursing (Addendum)
Pt became combative during procedure.  Wanting to sit up, flailing arms, kicking.  Abdomen rigid.  Blood pressure decreased to 62/32.  Two minutes later b/p 74/48, two minutes later 89/58.  At 10:40 b/p 117/71.  Pt quiet. Pt difficult to arouse.  Per verbal order from Dr Darrick Penna 0.4 mg Narcan administered at 1048 & 1058.  Pt continues to be difficult to arouse.  Pt sent to PACU.

## 2013-01-20 NOTE — Op Note (Signed)
[facility] [address] [place], [address]   COLONOSCOPY PROCEDURE REPORT  PATIENT: Connor Armstrong, Connor Armstrong  MR#: [id number] BIRTHDATE: [birth year] , 61  yrs. old GENDER: Male ENDOSCOPIST: Jonette Eva, MD REFERRED [id number] Juanetta Gosling, M.D. PROCEDURE DATE:  01/20/2013 PROCEDURE:   Colonoscopy with snare polypectomy, & cold biopsy polypectomy, and Submucosal injection, any substance  INDICATIONS:Average risk patient for colon cancer.   MEDICATIONS: Demerol 100 mg IV, Versed 7 mg IV, and Narcan 0.8 IV  DESCRIPTION OF PROCEDURE:    Physical exam was performed.  Informed consent was obtained from the patient after explaining the benefits, risks, and alternatives to procedure.  The patient was connected to monitor and placed in left lateral position. Continuous oxygen was provided by nasal cannula and IV medicine administered through an indwelling cannula.  After administration of sedation and rectal exam, the patients rectum was intubated and the EC-3890Li ([id number])  colonoscope was advanced under direct visualization to the ileum. THE LARGE HF POLYP COULD NOT BE ASPIRATED. POLYP REMOVED VIA ROTH NET.  The scope was removed slowly by carefully examining the color, texture, anatomy, and integrity mucosa on the way out.  THE SCOPE WAS INSERTED AND AGAIN ADVANCED UNDER DIRECT VISUALIZATION TO THE HEPATIC FLEXURE. The scope was removed slowly by carefully examining the color, texture, anatomy, and integrity mucosa on the way out. THE PT BECAME AGITATED AND SAT UP IN THE BED. THE SCOPE WAS REMOVED. PT REASSURED A& SCOEP WAS INSERTED. The scope was removed slowly by carefully examining the color, texture, anatomy, and integrity mucosa on the way out. The patient was recovered in endoscopy/PACU and discharged home in satisfactory condition. ABDOMEN WAS SOFT.    COLON FINDINGS: A single polyp measuring 1.5 cm in size was found at the hepatic flexure. 3 CC EPI was given to  PREVENT POST-POLYPECTOMYBleeding.  , A polypectomy was performed using snare cautery.  The resection was complete and the polyp tissue was completely retrieved VIA ROTH NET. 1 CC   Injection SPOT was performed.  2 HEMOCLIPS PLACED TO CLOSE THE DEFECT & PREVENT POST-POLYPECTOMY BLEEDING. Two polyps measuring 3-8 mm in size were found at the hepatic flexure & REMOVED VIA cold forceps & snare cautery.  , A sessile polyp measuring 8 mm in size was found at the cecum & REMOVED VIA snare cautery. A sessile polyp measuring 3 mm in size was found at the splenic flexure & REMOVED VIA Cold forceps.  , Moderate sized internal hemorrhoids were found.  , There was mild diverticulosis noted in the descending colon and sigmoid colon with associated muscular hypertrophy.  , and The colon IS redundant.  The patient was moved on to HIS back to reach the cecum.  PREP QUALITY: good.  CECAL W/D TIME: 52 minutes     COMPLICATIONS: NARCAN 0.8 MG IV GIVEN DUE TO PT SEDATION AND APPARENT ILEUS ON POST TCS EXAM. PT RECOVERED IN PACU.  ENDOSCOPIC IMPRESSION: 1.   9 COLON POLYPS REMOVED 2.  MILD diverticulosis in the descending colon and sigmoid colon 3.  MODERATE INTERNAL HEMORRHOIDS  RECOMMENDATIONS: STRICTLY AVOID ASPIRIN, BC/GOODY POWDERS, IBUPROFEN/MOTRIN, OR NAPROXEN/ALEVE FOR 7 DAYS.  RESTART ASPIRIN MAY 21. NO MRI FOR 30 DAYS. FOLLOW A HIGH FIBER DIET.  AVOID ITEMS THAT CAUSE BLOATING. BIOPSY RESULTS SHOULD BE BACK IN 7 DAYS. Next colonoscopy  WITH AN OVERTUBE in 1 YEAR IF ADVANCED POLYPS AND 3 years IF SIMPLE ADENOMAS. ALL FIRST DEGREE RELATIVES NEED A COLONOSCOPY STARTING AT THE AGE OF 40 AND THEN  EVERY 5 YEARS.       _______________________________ Rosalie DoctorJonette Eva, MD 01/20/2013 1:46 PM     PATIENT NAME:  Connor Armstrong MR#: [id number]

## 2013-01-20 NOTE — Telephone Encounter (Signed)
Message copied by Glendora Score on Tue Jan 20, 2013  2:02 PM ------      Message from: [place]      Created: Tue Jan 20, 2013  1:28 PM       Egd/dil next TUESDAY with propofol due to solid dysphagia-colonic distention, agitation in spite of adequate sedation, polypharmacy ------

## 2013-01-21 ENCOUNTER — Encounter (HOSPITAL_COMMUNITY)
Admission: RE | Admit: 2013-01-21 | Discharge: 2013-01-21 | Disposition: A | Discharge status: Home / Self Care | Payer: Medicare Other | Source: Ambulatory Visit | Attending: Pulmonary Disease | Admitting: Pulmonary Disease

## 2013-01-23 ENCOUNTER — Encounter (HOSPITAL_COMMUNITY): Payer: Self-pay | Admitting: Gastroenterology

## 2013-01-23 ENCOUNTER — Encounter (HOSPITAL_COMMUNITY)
Admission: RE | Admit: 2013-01-23 | Discharge: 2013-01-23 | Disposition: A | Discharge status: Home / Self Care | Payer: Medicare Other | Source: Ambulatory Visit | Attending: Gastroenterology | Admitting: Gastroenterology

## 2013-01-23 LAB — BASIC METABOLIC PANEL
Calcium: 9.2 mg/dL (ref 8.4–10.5)
GFR calc non Af Amer: 64 mL/min — ABNORMAL LOW (ref >90)
Glucose, Bld: 93 mg/dL (ref 70–99)
Sodium: 139 mEq/L (ref 135–145)

## 2013-01-23 NOTE — Patient Instructions (Addendum)
Connor Armstrong  01/23/2013   Your procedure is scheduled on:  Tuesday, 01/27/13  Report to [facility] at 0700 AM.  Call this number if you have problems the morning of surgery: [phone]   Remember:   Do not eat food or drink liquids after midnight.   Take these medicines the morning of surgery with A SIP OF WATER: albuterol, flexeril, finasteride (proscar), advair, spiriva, savella, neurontin, protonix, roflumilast (daliresp)   Do not wear jewelry, make-up or nail polish.  Do not wear lotions, powders, or perfumes. You may wear deodorant.  Do not shave 48 hours prior to surgery. Men may shave face and neck.  Do not bring valuables to the hospital.  Contacts, dentures or bridgework may not be worn into surgery.  Leave suitcase in the car. After surgery it may be brought to your room.  For patients admitted to the hospital, checkout time is 11:00 AM the day of  discharge.   Patients discharged the day of surgery will not be allowed to drive  home.  Name and phone number of your driver: wife  Special Instructions: Shower using CHG 2 nights before surgery and the night before surgery.  If you shower the day of surgery use CHG.  Use special wash - you have one bottle of CHG for all showers.  You should use approximately 1/3 of the bottle for each shower.   Please read over the following fact sheets that you were given: MRSA Information, Surgical Site Infection Prevention, Anesthesia Post-op Instructions and Care and Recovery After Surgery  Esophagogastroduodenoscopy This is an endoscopic procedure (a procedure that uses a device like a flexible telescope) that allows your caregiver to view the upper stomach and small bowel. This test allows your caregiver to look at the esophagus. The esophagus carries food from your mouth to your stomach. They can also look at your duodenum. This is the first part of the small intestine that attaches to the stomach. This test is used to detect problems in the  bowel such as ulcers and inflammation. PREPARATION FOR TEST Nothing to eat after midnight the day before the test. NORMAL FINDINGS Normal esophagus, stomach, and duodenum. Ranges for normal findings may vary among different laboratories and hospitals. You should always check with your doctor after having lab work or other tests done to discuss the meaning of your test results and whether your values are considered within normal limits. MEANING OF TEST  Your caregiver will go over the test results with you and discuss the importance and meaning of your results, as well as treatment options and the need for additional tests if necessary. OBTAINING THE TEST RESULTS It is your responsibility to obtain your test results. Ask the lab or department performing the test when and how you will get your results. Document Released: 12/28/2004 Document Revised: 11/19/2011 Document Reviewed: 08/06/2008 [facility] Patient Information 2013 [place], [place].

## 2013-01-26 ENCOUNTER — Encounter (HOSPITAL_COMMUNITY)
Admission: RE | Admit: 2013-01-26 | Discharge: 2013-01-26 | Disposition: A | Discharge status: Home / Self Care | Payer: Medicare Other | Source: Ambulatory Visit | Attending: Pulmonary Disease | Admitting: Pulmonary Disease

## 2013-01-26 NOTE — Progress Notes (Signed)
Quick Note:  EGD upcoming, 5/20.  Hgb improved almost a gram from about 2 months ago. He has a microcytic, hypochromic anemia going on. Recheck CBC in [redacted] weeks along with iron, ferritin. ______

## 2013-01-27 ENCOUNTER — Other Ambulatory Visit: Payer: Self-pay

## 2013-01-27 ENCOUNTER — Encounter (HOSPITAL_COMMUNITY): Payer: Self-pay | Admitting: Anesthesiology

## 2013-01-27 ENCOUNTER — Telehealth: Payer: Self-pay | Admitting: Gastroenterology

## 2013-01-27 ENCOUNTER — Encounter (HOSPITAL_COMMUNITY): Payer: Self-pay | Admitting: *Deleted

## 2013-01-27 ENCOUNTER — Ambulatory Visit (HOSPITAL_COMMUNITY)
Admission: RE | Admit: 2013-01-27 | Discharge: 2013-01-27 | Disposition: A | Discharge status: Home / Self Care | Payer: Medicare Other | Source: Ambulatory Visit | Attending: Gastroenterology | Admitting: Gastroenterology

## 2013-01-27 ENCOUNTER — Ambulatory Visit (HOSPITAL_COMMUNITY): Payer: Medicare Other | Admitting: Anesthesiology

## 2013-01-27 ENCOUNTER — Encounter (HOSPITAL_COMMUNITY)
Admission: RE | Disposition: A | Discharge status: Home / Self Care | Payer: Self-pay | Source: Ambulatory Visit | Attending: Gastroenterology

## 2013-01-27 DIAGNOSIS — D509 Iron deficiency anemia, unspecified: Secondary | ICD-10-CM

## 2013-01-27 DIAGNOSIS — Z01812 Encounter for preprocedural laboratory examination: Secondary | ICD-10-CM | POA: Insufficient documentation

## 2013-01-27 DIAGNOSIS — J4489 Other specified chronic obstructive pulmonary disease: Secondary | ICD-10-CM | POA: Insufficient documentation

## 2013-01-27 DIAGNOSIS — K2990 Gastroduodenitis, unspecified, without bleeding: Secondary | ICD-10-CM

## 2013-01-27 DIAGNOSIS — Z01810 Encounter for preprocedural cardiovascular examination: Secondary | ICD-10-CM | POA: Insufficient documentation

## 2013-01-27 DIAGNOSIS — K2940 Chronic atrophic gastritis without bleeding: Secondary | ICD-10-CM | POA: Insufficient documentation

## 2013-01-27 DIAGNOSIS — R1310 Dysphagia, unspecified: Secondary | ICD-10-CM

## 2013-01-27 DIAGNOSIS — I10 Essential (primary) hypertension: Secondary | ICD-10-CM | POA: Insufficient documentation

## 2013-01-27 DIAGNOSIS — Z79899 Other long term (current) drug therapy: Secondary | ICD-10-CM | POA: Insufficient documentation

## 2013-01-27 DIAGNOSIS — K2970 Gastritis, unspecified, without bleeding: Secondary | ICD-10-CM

## 2013-01-27 DIAGNOSIS — J449 Chronic obstructive pulmonary disease, unspecified: Secondary | ICD-10-CM | POA: Insufficient documentation

## 2013-01-27 HISTORY — PX: SAVORY DILATION: SHX5439

## 2013-01-27 HISTORY — PX: ESOPHAGOGASTRODUODENOSCOPY (EGD) WITH PROPOFOL: SHX5813

## 2013-01-27 HISTORY — PX: BIOPSY: SHX5522

## 2013-01-27 LAB — KOH PREP

## 2013-01-27 SURGERY — ESOPHAGOGASTRODUODENOSCOPY (EGD) WITH PROPOFOL
Anesthesia: Monitor Anesthesia Care

## 2013-01-27 MED ORDER — GLYCOPYRROLATE 0.2 MG/ML IJ SOLN
0.2000 mg | Freq: Once | INTRAMUSCULAR | Status: AC
  Administered 2013-01-27: 0.2 mg via INTRAVENOUS

## 2013-01-27 MED ORDER — FENTANYL CITRATE 0.05 MG/ML IJ SOLN
INTRAMUSCULAR | Status: AC
  Filled 2013-01-27: qty 2

## 2013-01-27 MED ORDER — STERILE WATER FOR IRRIGATION IR SOLN
Status: DC | PRN
  Administered 2013-01-27: 09:00:00

## 2013-01-27 MED ORDER — LIDOCAINE HCL (CARDIAC) 10 MG/ML IV SOLN
INTRAVENOUS | Status: DC | PRN
  Administered 2013-01-27: 50 mg via INTRAVENOUS

## 2013-01-27 MED ORDER — LACTATED RINGERS IV SOLN
INTRAVENOUS | Status: DC
  Administered 2013-01-27: 09:00:00 via INTRAVENOUS

## 2013-01-27 MED ORDER — FENTANYL CITRATE 0.05 MG/ML IJ SOLN
INTRAMUSCULAR | Status: DC | PRN
  Administered 2013-01-27: 25 ug via INTRAVENOUS

## 2013-01-27 MED ORDER — MIDAZOLAM HCL 2 MG/2ML IJ SOLN
INTRAMUSCULAR | Status: AC
  Filled 2013-01-27: qty 2

## 2013-01-27 MED ORDER — ONDANSETRON HCL 4 MG/2ML IJ SOLN
INTRAMUSCULAR | Status: AC
  Filled 2013-01-27: qty 2

## 2013-01-27 MED ORDER — PROPOFOL 10 MG/ML IV EMUL
INTRAVENOUS | Status: AC
  Filled 2013-01-27: qty 20

## 2013-01-27 MED ORDER — PROPOFOL INFUSION 10 MG/ML OPTIME
INTRAVENOUS | Status: DC | PRN
  Administered 2013-01-27: 25 ug/kg/min via INTRAVENOUS

## 2013-01-27 MED ORDER — MINERAL OIL PO OIL
TOPICAL_OIL | ORAL | Status: AC
  Filled 2013-01-27: qty 30

## 2013-01-27 MED ORDER — FENTANYL CITRATE 0.05 MG/ML IJ SOLN
25.0000 ug | INTRAMUSCULAR | Status: DC | PRN
  Administered 2013-01-27: 25 ug via INTRAVENOUS

## 2013-01-27 MED ORDER — GLYCOPYRROLATE 0.2 MG/ML IJ SOLN
INTRAMUSCULAR | Status: AC
  Filled 2013-01-27: qty 1

## 2013-01-27 MED ORDER — LACTATED RINGERS IV SOLN
INTRAVENOUS | Status: DC | PRN
  Administered 2013-01-27: 08:00:00 via INTRAVENOUS

## 2013-01-27 MED ORDER — FENTANYL CITRATE 0.05 MG/ML IJ SOLN: 25.0000 ug | INTRAMUSCULAR | Status: DC | PRN

## 2013-01-27 MED ORDER — BUTAMBEN-TETRACAINE-BENZOCAINE 2-2-14 % EX AERO
1.0000 | INHALATION_SPRAY | Freq: Once | CUTANEOUS | Status: AC
  Administered 2013-01-27: 1 via TOPICAL
  Filled 2013-01-27: qty 56

## 2013-01-27 MED ORDER — MIDAZOLAM HCL 5 MG/5ML IJ SOLN
INTRAMUSCULAR | Status: DC | PRN
  Administered 2013-01-27: .5 mg via INTRAVENOUS

## 2013-01-27 MED ORDER — ONDANSETRON HCL 4 MG/2ML IJ SOLN
4.0000 mg | Freq: Once | INTRAMUSCULAR | Status: AC
  Administered 2013-01-27: 4 mg via INTRAVENOUS

## 2013-01-27 MED ORDER — LIDOCAINE HCL (PF) 1 % IJ SOLN
INTRAMUSCULAR | Status: AC
  Filled 2013-01-27: qty 5

## 2013-01-27 MED ORDER — ONDANSETRON HCL 4 MG/2ML IJ SOLN: 4.0000 mg | Freq: Once | INTRAMUSCULAR | Status: DC | PRN

## 2013-01-27 MED ORDER — MIDAZOLAM HCL 2 MG/2ML IJ SOLN
1.0000 mg | INTRAMUSCULAR | Status: DC | PRN
  Administered 2013-01-27: 2 mg via INTRAVENOUS

## 2013-01-27 SURGICAL SUPPLY — 10 items
BLOCK BITE 60FR ADLT L/F BLUE (MISCELLANEOUS) ×2 IMPLANT
BRUSH CYTO GASTROINTESTINAL (MISCELLANEOUS) ×1 IMPLANT
FLOOR PAD 36X40 (MISCELLANEOUS) ×2
FORCEPS BIOP RAD 4 LRG CAP 4 (CUTTING FORCEPS) ×1 IMPLANT
MANIFOLD NEPTUNE II (INSTRUMENTS) ×2 IMPLANT
PAD FLOOR 36X40 (MISCELLANEOUS) ×1 IMPLANT
SYR 50ML LL SCALE MARK (SYRINGE) ×1 IMPLANT
TUBING ENDO SMARTCAP PENTAX (MISCELLANEOUS) ×2 IMPLANT
TUBING IRRIGATION ENDOGATOR (MISCELLANEOUS) ×2 IMPLANT
WATER STERILE IRR 1000ML POUR (IV SOLUTION) ×1 IMPLANT

## 2013-01-27 NOTE — Anesthesia Postprocedure Evaluation (Signed)
  Anesthesia Post-op Note  Patient: Connor Armstrong  Procedure(s) Performed: Procedure(s) with comments: ESOPHAGOGASTRODUODENOSCOPY (EGD) WITH PROPOFOL (N/A) SAVORY DILATION (N/A) BIOPSY (N/A) - Gastric Biopsies  Patient Location: PACU  Anesthesia Type:MAC  Level of Consciousness: awake, alert , oriented and patient cooperative  Airway and Oxygen Therapy: Patient Spontanous Breathing and Patient connected to nasal cannula oxygen  Post-op Pain: none  Post-op Assessment: Post-op Vital signs reviewed, Patient's Cardiovascular Status Stable, Respiratory Function Stable, Patent Airway and No signs of Nausea or vomiting  Post-op Vital Signs: Reviewed and stable  Complications: No apparent anesthesia complications

## 2013-01-27 NOTE — Preoperative (Signed)
Beta Blockers   Reason not to administer Beta Blockers:Not Applicable 

## 2013-01-27 NOTE — Telephone Encounter (Signed)
Cc PCP 

## 2013-01-27 NOTE — Interval H&P Note (Signed)
History and Physical Interval Note:  01/27/2013 8:25 AM  Connor Armstrong  has presented today for surgery, with the diagnosis of solid dysphagia colonic distention  The various methods of treatment have been discussed with the patient and family. After consideration of risks, benefits and other options for treatment, the patient has consented to  Procedure(s): ESOPHAGOGASTRODUODENOSCOPY (EGD) WITH PROPOFOL (N/A) SAVORY DILATION (N/A) as a surgical intervention .  The patient's history has been reviewed, patient examined, no change in status, stable for surgery.  I have reviewed the patient's chart and labs.  Questions were answered to the patient's satisfaction.     [employer]

## 2013-01-27 NOTE — Anesthesia Procedure Notes (Signed)
Procedure Name: MAC Performed by: ANDRAZA, AMY L Pre-anesthesia Checklist: Patient identified, Timeout performed, Emergency Drugs available, Suction available and Patient being monitored Oxygen Delivery Method: Nasal cannula     

## 2013-01-27 NOTE — Telephone Encounter (Signed)
PT SEEN IN ENDOSCOPY. DISCUSSED RESULTS. NEXT TCS IN 3 YEARS WITH PROPOFOL/OVERTUBE. ALL FIRST DEGREE RELATIVES NEED TCS AT AGE 62 AND THEN EVERY 5 YEARS. EGD/DIL TODAY. NEED OPV IN 3 MOS E30 DYSPHAGIA/ANEMIA. NEEDS CBC/FERRITIN 1 WEEK PRIOR TO OPV.

## 2013-01-27 NOTE — Transfer of Care (Signed)
Immediate Anesthesia Transfer of Care Note  Patient: Connor Armstrong  Procedure(s) Performed: Procedure(s) with comments: ESOPHAGOGASTRODUODENOSCOPY (EGD) WITH PROPOFOL (N/A) SAVORY DILATION (N/A) BIOPSY (N/A) - Gastric Biopsies  Patient Location: PACU  Anesthesia Type:MAC  Level of Consciousness: awake, alert , oriented and patient cooperative  Airway & Oxygen Therapy: Patient Spontanous Breathing and Patient connected to nasal cannula oxygen  Post-op Assessment: Report given to PACU RN and Post -op Vital signs reviewed and stable  Post vital signs: Reviewed and stable  Complications: No apparent anesthesia complications

## 2013-01-27 NOTE — Progress Notes (Signed)
Quick Note:  Per Tobi Bastos, I do not need to do these lab orders since Dr. Darrick Penna has placed some orders. ______

## 2013-01-27 NOTE — Telephone Encounter (Signed)
Labs on order for 04/10/2013.

## 2013-01-27 NOTE — H&P (View-Only) (Signed)
Primary Care Physician:  Fredirick Maudlin, MD Primary Gastroenterologist:  Dr. Darrick Penna  Pre-Procedure History & Physical: HPI:  Connor Armstrong is a 62 y.o. male here for COLON CANCER SCREENING/dysphagia-PMHx: CANDIDA ESOPHAGITIS/H PYLORI.   Past Medical History  Diagnosis Date  . COPD (chronic obstructive pulmonary disease)   . Hypertension   . Fibromyalgia   . Shingles   . Cancer     kidney  . Helicobacter pylori gastritis March 2014  . Candida esophagitis March 2014    Past Surgical History  Procedure Laterality Date  . Hernia repair    . Thyroid surgery    . Parathyroid surgery      benign tumor  . Partial nephrectomy  2005    right  . Flexible sigmoidoscopy  09/27/2012    ZOX:[id number]  . Esophagogastroduodenoscopy (egd) with esophageal dilation N/A 11/21/2012    SLF: DYSPHAGIA MOST LIKELY DUE TO CANDIDA ESOPHAGITIS/Small hiatal hernia/ MODERATE Non-erosive gastritis with +H.PYLORI ON PATH    Prior to Admission medications   Medication Sig Start Date End Date Taking? Authorizing Provider  acetaminophen (TYLENOL) 500 MG tablet Take 1,000 mg by mouth every 6 (six) hours as needed. For pain    Yes Historical Provider, MD  albuterol (PROVENTIL HFA;VENTOLIN HFA) 108 (90 BASE) MCG/ACT inhaler Inhale 2 puffs into the lungs every 6 (six) hours as needed. For shortness of breath    Yes Historical Provider, MD  albuterol (PROVENTIL) (2.5 MG/3ML) 0.083% nebulizer solution Take 2.5 mg by nebulization 3 (three) times daily. 12/14/11  Yes Fredirick Maudlin, MD  aspirin 81 MG chewable tablet Chew 81 mg by mouth daily.   Yes Historical Provider, MD  cyclobenzaprine (FLEXERIL) 10 MG tablet Take 10 mg by mouth 2 (two) times daily.     Yes Historical Provider, MD  dextromethorphan-guaiFENesin (MUCINEX DM) 30-600 MG per 12 hr tablet Take 1 tablet by mouth every 12 (twelve) hours as needed. For congestion   Yes Historical Provider, MD  finasteride (PROSCAR) 5 MG tablet Take 5  mg by mouth daily.   Yes Historical Provider, MD  Fluticasone-Salmeterol (ADVAIR) 250-50 MCG/DOSE AEPB Inhale 1 puff into the lungs every 12 (twelve) hours.     Yes Historical Provider, MD  furosemide (LASIX) 40 MG tablet Take 40 mg by mouth daily.   Yes Historical Provider, MD  gabapentin (NEURONTIN) 300 MG capsule Take 300 mg by mouth at bedtime.   Yes Historical Provider, MD  Milnacipran (SAVELLA) 50 MG TABS Take 50 mg by mouth 2 (two) times daily.     Yes Historical Provider, MD  pantoprazole (PROTONIX) 40 MG tablet Take 1 tablet (40 mg total) by mouth daily. 11/21/12  Yes Carylon Perches, MD  polyethylene glycol [facility] / GLYCOLAX) packet Take 17 g by mouth daily.   Yes Historical Provider, MD  polyethylene glycol-electrolytes (TRILYTE) 420 G solution Take 4,000 mLs by mouth as directed. 01/13/13  Yes West Bali, MD  roflumilast (DALIRESP) 500 MCG TABS tablet Take 1 tablet (500 mcg total) by mouth daily. 12/14/11  Yes Fredirick Maudlin, MD  tiotropium (SPIRIVA) 18 MCG inhalation capsule Place 18 mcg into inhaler and inhale daily.     Yes Historical Provider, MD  SALINE NASAL MIST NA Place 1-2 sprays into the nose as needed (nasal congestion).    Historical Provider, MD  tadalafil (CIALIS) 5 MG tablet Take 5 mg by mouth daily as needed for erectile dysfunction.     Historical Provider, MD    Allergies as of 01/13/2013 -  Review Complete 01/13/2013  Allergen Reaction Noted  . Morphine and related Other (See Comments) 11/01/2012    Family History  Problem Relation Age of Onset  . Hypertension Mother   . Colon cancer Paternal Grandmother     History   Social History  . Marital Status: Married    Spouse Name: N/A    Number of Children: N/A  . Years of Education: N/A   Occupational History  . disability    Social History Main Topics  . Smoking status: Former Smoker -- 1.00 packs/day for 42 years    Types: Cigarettes    Quit date: 09/20/2012  . Smokeless tobacco: Not on file  . Alcohol  Use: No     Comment: no ETOH in 30 years   . Drug Use: No  . Sexually Active: Not Currently   Other Topics Concern  . Not on file   Social History Narrative  . No narrative on file    Review of Systems: See HPI, otherwise negative ROS   Physical Exam: BP 154/99  Pulse 106  Temp(Src) 97.7 F (36.5 C) (Oral)  Resp 24  Ht 5\' 10"  (1.778 m)  Wt 157 lb (71.215 kg)  BMI 22.53 kg/m2  SpO2 99% General:   Alert,  pleasant and cooperative in NAD Head:  Normocephalic and atraumatic. Neck:  Supple; Lungs:  Clear throughout to auscultation.    Heart:  Regular rate and rhythm. Abdomen:  Soft, nontender and nondistended. Normal bowel sounds, without guarding, and without rebound.   Neurologic:  Alert and  oriented x4;  grossly normal neurologically.  Impression/Plan:     SCREENING/DYSPHAGIA  Plan:  1. TCS/EGD/DIL TODAY

## 2013-01-27 NOTE — Progress Notes (Signed)
Pt. Taken to phase 2 via stretcher with O2 @ 3L via [place] in place.  Assisted pt. To recliner & connected to monitor and O2 @ 3L via  was reapplied.  HR was 210 on monitor & BP was 120/77.  Pt. Denied any chest pain or trouble breathing.  Pt. Performed valsalva maneuver.  Pulse was strong with rate of 120 & HR was 180 on monitor. EKG was obtained & showed sinus tach with rate of 110.  Pt. Still denies any discomfort, chest pain or trouble breathing.  Dr. Jayme Cloud aware & no new orders were given.

## 2013-01-27 NOTE — Anesthesia Preprocedure Evaluation (Signed)
Anesthesia Evaluation  Patient identified by MRN, date of birth, ID band Patient awake    Reviewed: Allergy & Precautions, H&P , NPO status , Patient's Chart, lab work & pertinent test results  Airway Mallampati: II TM Distance: >3 FB     Dental  (+) Edentulous Upper   Pulmonary pneumonia -, resolved, COPD (hx intubation for resp failure) COPD inhaler and oxygen dependent,  + rhonchi         Cardiovascular hypertension, Pt. on medications Rhythm:Regular Rate:Normal     Neuro/Psych    GI/Hepatic Dysphagia    Endo/Other    Renal/GU      Musculoskeletal  (+) Fibromyalgia -  Abdominal   Peds  Hematology   Anesthesia Other Findings   Reproductive/Obstetrics                           Anesthesia Physical Anesthesia Plan  ASA: III  Anesthesia Plan: MAC   Post-op Pain Management:    Induction: Intravenous  Airway Management Planned: Simple Face Mask  Additional Equipment:   Intra-op Plan:   Post-operative Plan:   Informed Consent: I have reviewed the patients History and Physical, chart, labs and discussed the procedure including the risks, benefits and alternatives for the proposed anesthesia with the patient or authorized representative who has indicated his/her understanding and acceptance.     Plan Discussed with:   Anesthesia Plan Comments:         Anesthesia Quick Evaluation

## 2013-01-28 ENCOUNTER — Encounter (HOSPITAL_COMMUNITY): Payer: Self-pay | Admitting: Gastroenterology

## 2013-01-28 ENCOUNTER — Encounter (HOSPITAL_COMMUNITY)
Admission: RE | Admit: 2013-01-28 | Discharge: 2013-01-28 | Disposition: A | Discharge status: Home / Self Care | Payer: Medicare Other | Source: Ambulatory Visit | Attending: Pulmonary Disease | Admitting: Pulmonary Disease

## 2013-01-28 NOTE — Op Note (Signed)
[facility] [address] [place], [address]   ENDOSCOPY PROCEDURE REPORT  PATIENT: Connor Armstrong, Connor Armstrong  MR#: [id number] BIRTHDATE: [birth year] , 61  yrs. old GENDER: Male  ENDOSCOPIST: Jonette Eva, MD REFFERED FI:EPPIRJ Juanetta Gosling, M.D.  PROCEDURE DATE:  01/27/2013 PROCEDURE:   EGD with biopsy and EGD with dilatation over guidewire   INDICATIONS:1.  dysphagia. on prn po steroids and steroid inhaler for COPD. PMHX: CANDIDA ESOPHAGITIS MEDICATIONS: MAC sedation, administered by CRNA TOPICAL ANESTHETIC: Cetacaine Spray  DESCRIPTION OF PROCEDURE:   After the risks benefits and alternatives of the procedure were thoroughly explained, informed consent was obtained.  The     endoscope was introduced through the mouth and advanced to the second portion of the duodenum. The instrument was slowly withdrawn as the mucosa was carefully examined.  Prior to withdrawal of the scope, the guidwire was placed.  The esophagus was dilated successfully.  The patient was recovered in endoscopy and discharged home in satisfactory condition.   ESOPHAGUS: RARE WHITE PLAQUES IN THE ESOPHAGUS.  BRUSH BIOPSIES OBTAINED.   NO DEFINITE STRICTURE APPRECIATED.   STOMACH: Moderate non-erosive gastritis (inflammation) was found in the gastric antrum.  Multiple biopsies were performed using cold forceps. DUODENUM: The duodenal mucosa showed no abnormalities in the bulb and second portion of the duodenum.   Dilation was then performed at the proximal esophagus  Dilator: Savary over guidewire Size(s): 15-16 MM Resistance: minimal Heme: none NO MUCOSAL INJURY SEEN AFTER AT GE JXN.  SUPERFICIAL TRAUMA SEEN IN PROXIMAL STOMACH.  COMPLICATIONS: There were no complications.  ENDOSCOPIC IMPRESSION: 1.   RARE WHITE PLAQUES IN THE ESOPHAGUS.  BRUSH BIOPSIES OBTAINED.  2.   NO DEFINITE STRICTURE APPRECIATED. 3.   Non-erosive gastritis (inflammation) was found in the gastric antrum; multiple  biopsies 4.   The duodenal mucosa showed no abnormalities in the bulb and second portion of the duodenum  RECOMMENDATIONS : CONTINUE PROTONIX. AWAIT KOH PREP. FOLLOW A LOW FAT DIET. YOUR BIOPSY WILL BE BACK IN 7 DAYS. FOLLOW UP IN 3 MOS. CONSIDER BPE IF DYSPHAGIA CONTINUES.      _______________________________ Rosalie DoctorJonette Eva, MD 01/27/2013 4:34 PM      PATIENT NAME:  Connor Armstrong, Connor Armstrong MR#: [id number]

## 2013-01-28 NOTE — Telephone Encounter (Signed)
Reminder in epic °

## 2013-02-02 ENCOUNTER — Encounter (HOSPITAL_COMMUNITY): Payer: Medicare Other

## 2013-02-04 ENCOUNTER — Encounter (HOSPITAL_COMMUNITY): Payer: Medicare Other

## 2013-02-05 ENCOUNTER — Telehealth: Payer: Self-pay | Admitting: Gastroenterology

## 2013-02-05 MED ORDER — FLUCONAZOLE 200 MG PO TABS: ORAL_TABLET | ORAL | Status: DC

## 2013-02-05 NOTE — Telephone Encounter (Signed)
Called patient TO DISCUSS RESULTS. SWALLOWING BETTER. HAS NOT HAD ANY FOOD HANGING UP SINCE EGD/DIL. PT NEEDS DIFLUCAN 400 MG x1 THEN 200 MG FOR 14 DAYS. PT SHOULD NOT TAKE CIALIS WITH DIF. PT SHOULD TAKE DAILRESP QOD. DIF & Dalirep together may cause diarrhea, nausea & vomiting. PT voiced understanding.   NEED OPV IN JUL 2014 E30 DYSPHAGIA/ANEMIA. NEEDS CBC/FERRITIN 1 WEEK PRIOR TO OPV.

## 2013-02-06 NOTE — Telephone Encounter (Signed)
Cc PCP 

## 2013-02-09 ENCOUNTER — Encounter (HOSPITAL_COMMUNITY)
Admission: RE | Admit: 2013-02-09 | Discharge: 2013-02-09 | Disposition: A | Discharge status: Home / Self Care | Payer: Medicare Other | Source: Ambulatory Visit | Attending: Pulmonary Disease | Admitting: Pulmonary Disease

## 2013-02-09 DIAGNOSIS — J4489 Other specified chronic obstructive pulmonary disease: Secondary | ICD-10-CM | POA: Insufficient documentation

## 2013-02-09 DIAGNOSIS — J449 Chronic obstructive pulmonary disease, unspecified: Secondary | ICD-10-CM | POA: Insufficient documentation

## 2013-02-09 DIAGNOSIS — Z5189 Encounter for other specified aftercare: Secondary | ICD-10-CM | POA: Insufficient documentation

## 2013-02-10 ENCOUNTER — Telehealth: Payer: Self-pay | Admitting: Gastroenterology

## 2013-02-10 ENCOUNTER — Other Ambulatory Visit: Payer: Self-pay

## 2013-02-10 ENCOUNTER — Encounter: Payer: Self-pay | Admitting: Gastroenterology

## 2013-02-10 DIAGNOSIS — D509 Iron deficiency anemia, unspecified: Secondary | ICD-10-CM

## 2013-02-10 NOTE — Telephone Encounter (Signed)
Pt is aware of OV on 7/24 at 0900 with SF in E30, but will need cbc/ferritin one week prior to OV

## 2013-02-10 NOTE — Telephone Encounter (Signed)
Pt is aware of OV on 7/24 at 0900 with SF and appt card was mailed

## 2013-02-10 NOTE — Telephone Encounter (Signed)
Letter mailed to pt with orders of labs to be done one week prior to ov appt.

## 2013-02-11 ENCOUNTER — Encounter (HOSPITAL_COMMUNITY)
Admission: RE | Admit: 2013-02-11 | Discharge: 2013-02-11 | Disposition: A | Discharge status: Home / Self Care | Payer: Medicare Other | Source: Ambulatory Visit | Attending: Pulmonary Disease | Admitting: Pulmonary Disease

## 2013-02-16 ENCOUNTER — Encounter (HOSPITAL_COMMUNITY): Payer: Medicare Other

## 2013-02-18 ENCOUNTER — Encounter (HOSPITAL_COMMUNITY): Payer: Medicare Other

## 2013-02-23 ENCOUNTER — Encounter (HOSPITAL_COMMUNITY)
Admission: RE | Admit: 2013-02-23 | Discharge: 2013-02-23 | Disposition: A | Discharge status: Home / Self Care | Payer: Medicare Other | Source: Ambulatory Visit | Attending: Pulmonary Disease | Admitting: Pulmonary Disease

## 2013-02-25 ENCOUNTER — Encounter (HOSPITAL_COMMUNITY)
Admission: RE | Admit: 2013-02-25 | Discharge: 2013-02-25 | Disposition: A | Discharge status: Home / Self Care | Payer: Medicare Other | Source: Ambulatory Visit | Attending: Pulmonary Disease | Admitting: Pulmonary Disease

## 2013-03-02 ENCOUNTER — Encounter (HOSPITAL_COMMUNITY)
Admission: RE | Admit: 2013-03-02 | Discharge: 2013-03-02 | Disposition: A | Discharge status: Home / Self Care | Payer: Medicare Other | Source: Ambulatory Visit | Attending: Pulmonary Disease | Admitting: Pulmonary Disease

## 2013-03-04 ENCOUNTER — Encounter (HOSPITAL_COMMUNITY)
Admission: RE | Admit: 2013-03-04 | Discharge: 2013-03-04 | Disposition: A | Discharge status: Home / Self Care | Payer: Medicare Other | Source: Ambulatory Visit | Attending: Pulmonary Disease | Admitting: Pulmonary Disease

## 2013-03-09 ENCOUNTER — Encounter (HOSPITAL_COMMUNITY)
Admission: RE | Admit: 2013-03-09 | Discharge: 2013-03-09 | Disposition: A | Discharge status: Home / Self Care | Payer: Medicare Other | Source: Ambulatory Visit | Attending: Pulmonary Disease | Admitting: Pulmonary Disease

## 2013-03-11 ENCOUNTER — Encounter (HOSPITAL_COMMUNITY): Payer: Medicare Other

## 2013-03-16 ENCOUNTER — Encounter (HOSPITAL_COMMUNITY)
Admission: RE | Admit: 2013-03-16 | Discharge: 2013-03-16 | Disposition: A | Discharge status: Home / Self Care | Payer: Medicare Other | Source: Ambulatory Visit | Attending: Pulmonary Disease | Admitting: Pulmonary Disease

## 2013-03-16 DIAGNOSIS — Z5189 Encounter for other specified aftercare: Secondary | ICD-10-CM | POA: Insufficient documentation

## 2013-03-16 DIAGNOSIS — J449 Chronic obstructive pulmonary disease, unspecified: Secondary | ICD-10-CM | POA: Insufficient documentation

## 2013-03-16 DIAGNOSIS — J4489 Other specified chronic obstructive pulmonary disease: Secondary | ICD-10-CM | POA: Insufficient documentation

## 2013-03-18 ENCOUNTER — Encounter (HOSPITAL_COMMUNITY)
Admission: RE | Admit: 2013-03-18 | Discharge: 2013-03-18 | Disposition: A | Discharge status: Home / Self Care | Payer: Medicare Other | Source: Ambulatory Visit | Attending: Pulmonary Disease | Admitting: Pulmonary Disease

## 2013-03-23 ENCOUNTER — Encounter (HOSPITAL_COMMUNITY): Payer: Medicare Other

## 2013-03-25 ENCOUNTER — Encounter (HOSPITAL_COMMUNITY)
Admission: RE | Admit: 2013-03-25 | Discharge: 2013-03-25 | Disposition: A | Discharge status: Home / Self Care | Payer: Medicare Other | Source: Ambulatory Visit | Attending: Pulmonary Disease | Admitting: Pulmonary Disease

## 2013-03-30 ENCOUNTER — Encounter (HOSPITAL_COMMUNITY)
Admission: RE | Admit: 2013-03-30 | Discharge: 2013-03-30 | Disposition: A | Discharge status: Home / Self Care | Payer: Medicare Other | Source: Ambulatory Visit | Attending: Pulmonary Disease | Admitting: Pulmonary Disease

## 2013-03-31 ENCOUNTER — Encounter: Payer: Self-pay | Admitting: Gastroenterology

## 2013-04-01 ENCOUNTER — Encounter (HOSPITAL_COMMUNITY)
Admission: RE | Admit: 2013-04-01 | Discharge: 2013-04-01 | Disposition: A | Discharge status: Home / Self Care | Payer: Medicare Other | Source: Ambulatory Visit | Attending: Pulmonary Disease | Admitting: Pulmonary Disease

## 2013-04-02 ENCOUNTER — Ambulatory Visit: Payer: Medicare Other | Admitting: Gastroenterology

## 2013-04-06 ENCOUNTER — Encounter (HOSPITAL_COMMUNITY)
Admission: RE | Admit: 2013-04-06 | Discharge: 2013-04-06 | Disposition: A | Discharge status: Home / Self Care | Payer: Medicare Other | Source: Ambulatory Visit | Attending: Pulmonary Disease | Admitting: Pulmonary Disease

## 2013-04-08 ENCOUNTER — Encounter (HOSPITAL_COMMUNITY): Payer: Medicare Other

## 2013-04-09 IMAGING — CR DG CHEST 1V PORT
1 series · 1 of 1 positions shown · non-contrast
Comparison: 09/25/2012.

CLINICAL DATA: Shortness of breath.

PORTABLE CHEST - 1 VIEW

[view not recorded]
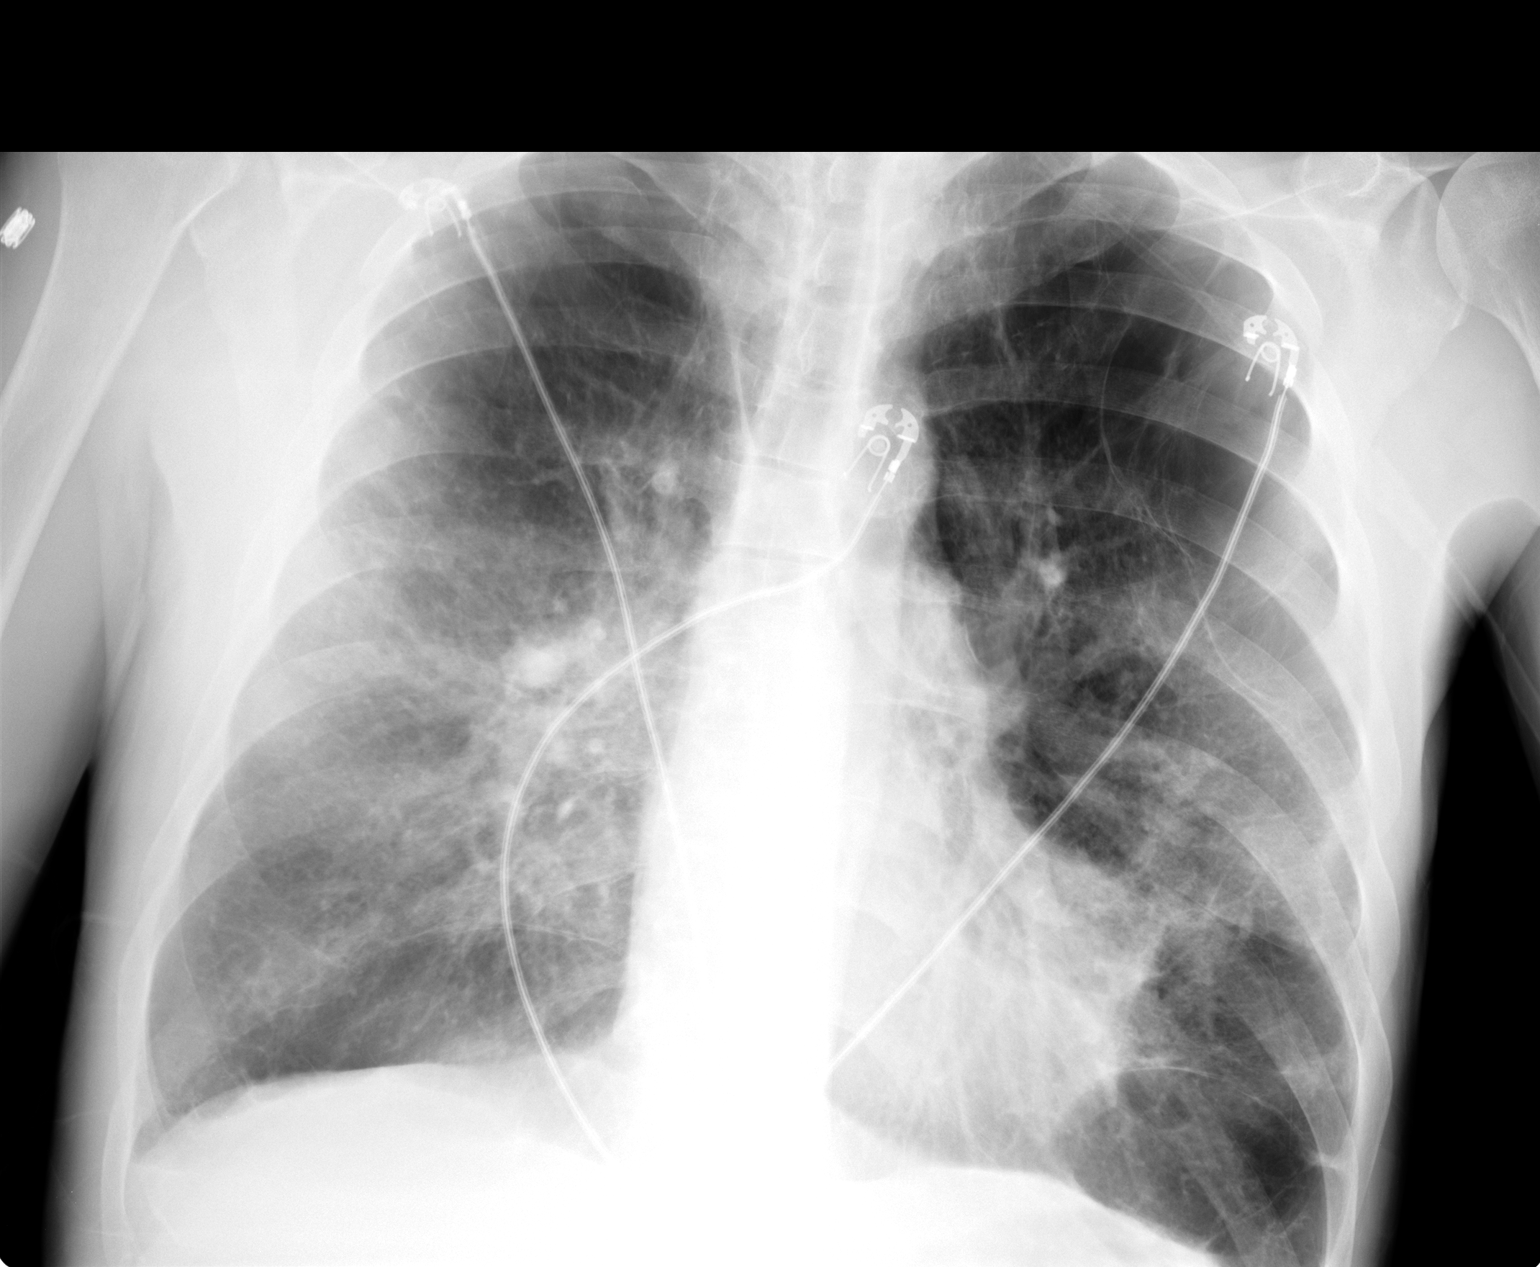

[1 of 1 positions shown; findings below may reference images not displayed]

FINDINGS: The cardiac silhouette, mediastinal and hilar contours
are stable.  Stable underlying emphysematous changes with apical
bulla.  Suspect asymmetric perihilar pulmonary edema.  No pleural
effusion.
IMPRESSION: Severe chronic emphysematous changes with suspected overlying
asymmetric perihilar edema.

## 2013-04-11 IMAGING — CR DG CHEST 1V PORT
1 series · 1 of 1 positions shown · non-contrast
Comparison: CT chest dated 11/01/2012

CLINICAL DATA: Confirm PICC line placement

PORTABLE CHEST - 1 VIEW

[view not recorded]
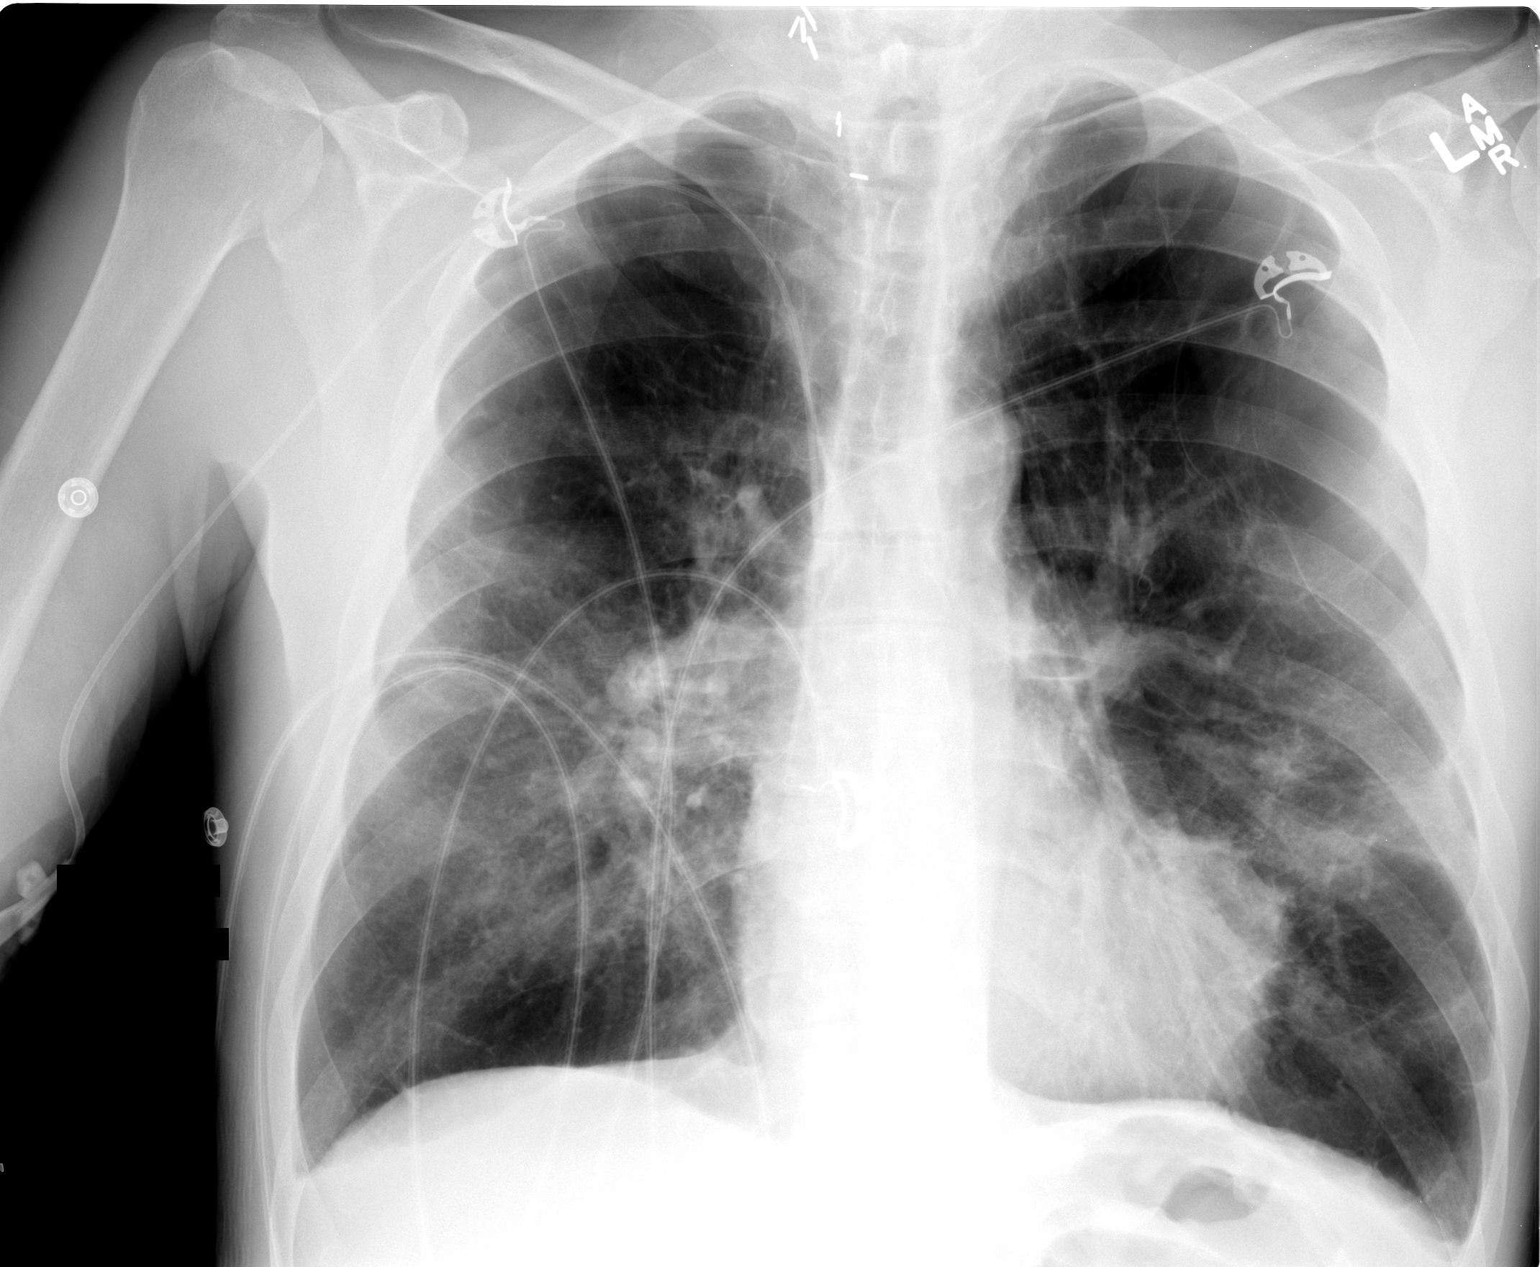

[1 of 1 positions shown; findings below may reference images not displayed]

FINDINGS: Right arm PICC terminates at the cavoatrial junction.

Chronic interstitial markings/emphysematous changes.  Patchy
lingular opacity, worrisome for pneumonia.  No pleural effusion or
pneumothorax.

Surgical clips in the right neck.
IMPRESSION: Right arm PICC terminates at the cavoatrial junction.

## 2013-04-13 ENCOUNTER — Encounter (HOSPITAL_COMMUNITY)
Admission: RE | Admit: 2013-04-13 | Discharge: 2013-04-13 | Disposition: A | Discharge status: Home / Self Care | Payer: Medicare Other | Source: Ambulatory Visit | Attending: Pulmonary Disease | Admitting: Pulmonary Disease

## 2013-04-13 DIAGNOSIS — J449 Chronic obstructive pulmonary disease, unspecified: Secondary | ICD-10-CM | POA: Insufficient documentation

## 2013-04-13 DIAGNOSIS — J4489 Other specified chronic obstructive pulmonary disease: Secondary | ICD-10-CM | POA: Insufficient documentation

## 2013-04-13 DIAGNOSIS — Z5189 Encounter for other specified aftercare: Secondary | ICD-10-CM | POA: Insufficient documentation

## 2013-04-15 ENCOUNTER — Ambulatory Visit (HOSPITAL_COMMUNITY)
Admission: RE | Admit: 2013-04-15 | Discharge: 2013-04-15 | Disposition: A | Discharge status: Home / Self Care | Payer: Medicare Other | Source: Ambulatory Visit | Attending: Pulmonary Disease | Admitting: Pulmonary Disease

## 2013-04-15 ENCOUNTER — Inpatient Hospital Stay (HOSPITAL_COMMUNITY): Payer: Medicare Other

## 2013-04-15 ENCOUNTER — Encounter (HOSPITAL_COMMUNITY): Payer: Self-pay | Admitting: *Deleted

## 2013-04-15 ENCOUNTER — Inpatient Hospital Stay (HOSPITAL_COMMUNITY)
Admission: AD | Admit: 2013-04-15 | Discharge: 2013-04-16 | DRG: 189 | Disposition: A | Discharge status: Home / Self Care | Payer: Medicare Other | Source: Ambulatory Visit | Attending: Pulmonary Disease | Admitting: Pulmonary Disease

## 2013-04-15 ENCOUNTER — Encounter (HOSPITAL_COMMUNITY): Admission: RE | Admit: 2013-04-15 | Payer: Medicare Other | Source: Ambulatory Visit

## 2013-04-15 DIAGNOSIS — M797 Fibromyalgia: Secondary | ICD-10-CM | POA: Diagnosis present

## 2013-04-15 DIAGNOSIS — D638 Anemia in other chronic diseases classified elsewhere: Secondary | ICD-10-CM | POA: Diagnosis present

## 2013-04-15 DIAGNOSIS — Z9981 Dependence on supplemental oxygen: Secondary | ICD-10-CM

## 2013-04-15 DIAGNOSIS — I10 Essential (primary) hypertension: Secondary | ICD-10-CM | POA: Diagnosis present

## 2013-04-15 DIAGNOSIS — Z87891 Personal history of nicotine dependence: Secondary | ICD-10-CM

## 2013-04-15 DIAGNOSIS — J441 Chronic obstructive pulmonary disease with (acute) exacerbation: Secondary | ICD-10-CM | POA: Diagnosis present

## 2013-04-15 DIAGNOSIS — R000 Tachycardia, unspecified: Secondary | ICD-10-CM | POA: Diagnosis present

## 2013-04-15 DIAGNOSIS — R339 Retention of urine, unspecified: Secondary | ICD-10-CM | POA: Diagnosis present

## 2013-04-15 DIAGNOSIS — D649 Anemia, unspecified: Secondary | ICD-10-CM | POA: Diagnosis present

## 2013-04-15 DIAGNOSIS — J9620 Acute and chronic respiratory failure, unspecified whether with hypoxia or hypercapnia: Secondary | ICD-10-CM | POA: Diagnosis present

## 2013-04-15 LAB — CBC WITH DIFFERENTIAL/PLATELET
Basophils Absolute: 0 10*3/uL (ref 0.0–0.1)
Basophils Relative: 0 % (ref 0–1)
Eosinophils Absolute: 0.1 10*3/uL (ref 0.0–0.7)
HCT: 38.8 % — ABNORMAL LOW (ref 39.0–52.0)
Hemoglobin: 10.4 g/dL — ABNORMAL LOW (ref 13.0–17.0)
MCH: 21.1 pg — ABNORMAL LOW (ref 26.0–34.0)
MCHC: 26.8 g/dL — ABNORMAL LOW (ref 30.0–36.0)
Monocytes Absolute: 0.3 10*3/uL (ref 0.1–1.0)
Monocytes Relative: 3 % (ref 3–12)
Neutro Abs: 8.9 10*3/uL — ABNORMAL HIGH (ref 1.7–7.7)
Neutrophils Relative %: 90 % — ABNORMAL HIGH (ref 43–77)
RDW: 18.8 % — ABNORMAL HIGH (ref 11.5–15.5)

## 2013-04-15 LAB — COMPREHENSIVE METABOLIC PANEL
ALT: 15 U/L (ref 0–53)
AST: 21 U/L (ref 0–37)
Alkaline Phosphatase: 78 U/L (ref 39–117)
CO2: 45 mEq/L (ref 19–32)
GFR calc Af Amer: 82 mL/min — ABNORMAL LOW (ref >90)
GFR calc non Af Amer: 71 mL/min — ABNORMAL LOW (ref >90)
Glucose, Bld: 138 mg/dL — ABNORMAL HIGH (ref 70–99)
Potassium: 4.6 mEq/L (ref 3.5–5.1)
Sodium: 141 mEq/L (ref 135–145)

## 2013-04-15 LAB — BLOOD GAS, ARTERIAL
Bicarbonate: 41.2 mEq/L — ABNORMAL HIGH (ref 20.0–24.0)
O2 Content: 4 L/min
pH, Arterial: 7.345 — ABNORMAL LOW (ref 7.350–7.450)
pO2, Arterial: 44.4 mmHg — ABNORMAL LOW (ref 80.0–100.0)

## 2013-04-15 LAB — MRSA PCR SCREENING: MRSA by PCR: NEGATIVE

## 2013-04-15 MED ORDER — ONDANSETRON HCL 4 MG/2ML IJ SOLN: 4.0000 mg | Freq: Four times a day (QID) | INTRAMUSCULAR | Status: DC | PRN

## 2013-04-15 MED ORDER — DOCUSATE SODIUM 100 MG PO CAPS
100.0000 mg | ORAL_CAPSULE | Freq: Two times a day (BID) | ORAL | Status: DC
  Administered 2013-04-15 – 2013-04-16 (×3): 100 mg via ORAL
  Filled 2013-04-15 (×3): qty 1

## 2013-04-15 MED ORDER — FINASTERIDE 5 MG PO TABS
5.0000 mg | ORAL_TABLET | Freq: Every day | ORAL | Status: DC
  Administered 2013-04-15 – 2013-04-16 (×2): 5 mg via ORAL
  Filled 2013-04-15 (×3): qty 1

## 2013-04-15 MED ORDER — GUAIFENESIN ER 600 MG PO TB12
1200.0000 mg | ORAL_TABLET | Freq: Two times a day (BID) | ORAL | Status: DC
  Administered 2013-04-15 – 2013-04-16 (×3): 1200 mg via ORAL
  Filled 2013-04-15 (×3): qty 2

## 2013-04-15 MED ORDER — SODIUM CHLORIDE 0.9 % IJ SOLN: 3.0000 mL | INTRAMUSCULAR | Status: DC | PRN

## 2013-04-15 MED ORDER — MOMETASONE FURO-FORMOTEROL FUM 100-5 MCG/ACT IN AERO
2.0000 | INHALATION_SPRAY | Freq: Two times a day (BID) | RESPIRATORY_TRACT | Status: DC
  Administered 2013-04-15 – 2013-04-16 (×2): 2 via RESPIRATORY_TRACT
  Filled 2013-04-15: qty 8.8

## 2013-04-15 MED ORDER — ACETAMINOPHEN 650 MG RE SUPP: 650.0000 mg | Freq: Four times a day (QID) | RECTAL | Status: DC | PRN

## 2013-04-15 MED ORDER — DEXTROSE 5 % IV SOLN
1.0000 g | INTRAVENOUS | Status: DC
  Administered 2013-04-15: 1 g via INTRAVENOUS
  Filled 2013-04-15 (×2): qty 10

## 2013-04-15 MED ORDER — SODIUM CHLORIDE 0.9 % IV SOLN
250.0000 mL | INTRAVENOUS | Status: DC | PRN
  Administered 2013-04-15: 250 mL via INTRAVENOUS

## 2013-04-15 MED ORDER — ENOXAPARIN SODIUM 40 MG/0.4ML ~~LOC~~ SOLN
40.0000 mg | SUBCUTANEOUS | Status: DC
  Administered 2013-04-15: 40 mg via SUBCUTANEOUS
  Filled 2013-04-15: qty 0.4

## 2013-04-15 MED ORDER — METHYLPREDNISOLONE SODIUM SUCC 125 MG IJ SOLR
125.0000 mg | Freq: Four times a day (QID) | INTRAMUSCULAR | Status: DC
  Administered 2013-04-15 – 2013-04-16 (×4): 125 mg via INTRAVENOUS
  Filled 2013-04-15 (×4): qty 2

## 2013-04-15 MED ORDER — SODIUM CHLORIDE 0.9 % IJ SOLN: 3.0000 mL | Freq: Two times a day (BID) | INTRAMUSCULAR | Status: DC

## 2013-04-15 MED ORDER — DEXTROSE 5 % IV SOLN
500.0000 mg | INTRAVENOUS | Status: DC
  Administered 2013-04-15: 500 mg via INTRAVENOUS
  Filled 2013-04-15 (×2): qty 500

## 2013-04-15 MED ORDER — ALUM & MAG HYDROXIDE-SIMETH 200-200-20 MG/5ML PO SUSP: 30.0000 mL | Freq: Four times a day (QID) | ORAL | Status: DC | PRN

## 2013-04-15 MED ORDER — TIOTROPIUM BROMIDE MONOHYDRATE 18 MCG IN CAPS
18.0000 ug | ORAL_CAPSULE | Freq: Every day | RESPIRATORY_TRACT | Status: DC
  Administered 2013-04-16: 18 ug via RESPIRATORY_TRACT
  Filled 2013-04-15: qty 5

## 2013-04-15 MED ORDER — ROFLUMILAST 500 MCG PO TABS
500.0000 ug | ORAL_TABLET | Freq: Every day | ORAL | Status: DC
  Administered 2013-04-15 – 2013-04-16 (×2): 500 ug via ORAL
  Filled 2013-04-15 (×3): qty 1

## 2013-04-15 MED ORDER — HYDROCODONE-ACETAMINOPHEN 5-325 MG PO TABS
1.0000 | ORAL_TABLET | ORAL | Status: DC | PRN
  Administered 2013-04-15: 2 via ORAL
  Filled 2013-04-15: qty 2

## 2013-04-15 MED ORDER — MILNACIPRAN HCL 50 MG PO TABS
50.0000 mg | ORAL_TABLET | Freq: Two times a day (BID) | ORAL | Status: DC
  Administered 2013-04-15 – 2013-04-16 (×3): 50 mg via ORAL
  Filled 2013-04-15 (×5): qty 1

## 2013-04-15 MED ORDER — SODIUM CHLORIDE 0.9 % IJ SOLN
3.0000 mL | Freq: Two times a day (BID) | INTRAMUSCULAR | Status: DC
  Administered 2013-04-15 (×2): 3 mL via INTRAVENOUS

## 2013-04-15 MED ORDER — ALBUTEROL SULFATE (5 MG/ML) 0.5% IN NEBU
2.5000 mg | INHALATION_SOLUTION | Freq: Four times a day (QID) | RESPIRATORY_TRACT | Status: DC
  Administered 2013-04-15 – 2013-04-16 (×3): 2.5 mg via RESPIRATORY_TRACT
  Filled 2013-04-15 (×3): qty 0.5

## 2013-04-15 MED ORDER — ONDANSETRON HCL 4 MG PO TABS: 4.0000 mg | ORAL_TABLET | Freq: Four times a day (QID) | ORAL | Status: DC | PRN

## 2013-04-15 MED ORDER — GABAPENTIN 300 MG PO CAPS
300.0000 mg | ORAL_CAPSULE | Freq: Every day | ORAL | Status: DC
  Administered 2013-04-15: 300 mg via ORAL
  Filled 2013-04-15: qty 1

## 2013-04-15 MED ORDER — POLYETHYLENE GLYCOL 3350 17 G PO PACK
17.0000 g | PACK | Freq: Every day | ORAL | Status: DC
  Administered 2013-04-15: 17 g via ORAL
  Filled 2013-04-15 (×2): qty 1

## 2013-04-15 MED ORDER — ALBUTEROL SULFATE (5 MG/ML) 0.5% IN NEBU: 2.5000 mg | INHALATION_SOLUTION | RESPIRATORY_TRACT | Status: DC | PRN

## 2013-04-15 MED ORDER — ACETAMINOPHEN 325 MG PO TABS: 650.0000 mg | ORAL_TABLET | Freq: Four times a day (QID) | ORAL | Status: DC | PRN

## 2013-04-15 MED ORDER — FUROSEMIDE 40 MG PO TABS
40.0000 mg | ORAL_TABLET | Freq: Every day | ORAL | Status: DC
  Administered 2013-04-15 – 2013-04-16 (×2): 40 mg via ORAL
  Filled 2013-04-15 (×2): qty 1

## 2013-04-15 MED ORDER — PANTOPRAZOLE SODIUM 40 MG PO TBEC
40.0000 mg | DELAYED_RELEASE_TABLET | Freq: Every day | ORAL | Status: DC
  Administered 2013-04-15 – 2013-04-16 (×2): 40 mg via ORAL
  Filled 2013-04-15 (×2): qty 1

## 2013-04-15 NOTE — H&P (Signed)
Connor Armstrong MRN: [id number] DOB/AGE: 62-Jul-1952 62 y.o. Primary Care Physician:Hanya Guerin L, MD Admit date: 04/15/2013 Chief Complaint: Hypoxia HPI: This is a 62 year old with a long known history of COPD. He has been having more trouble with his breathing and has been having some neurological symptoms according to his wife. He came to my office and although he seemed to be fairly clear as far as his lungs were concerned I had him go get a blood gas because of the concerns about oxygenation. He was found to have a PO2 of 44 while he was on oxygen at 4 L. It should be noted that this was a portable tank but uses a pulsing system for oxygen conservation and that may have something to do with his hypoxia. He says he thinks he's been a little more short of breath today. He is having more trouble with fibromyalgia. He has not had fever chills cough sputum production or chest pain  Past Medical History  Diagnosis Date  . COPD (chronic obstructive pulmonary disease)   . Fibromyalgia   . Shingles   . Cancer     kidney  . Helicobacter pylori gastritis March 2014  . Candida esophagitis March 2014  . On supplemental oxygen therapy     2-3L/min and on BPAP @ HS  . Hypertension     "he doesnt take medication anymore because no longer has high blood pressure" per family   Past Surgical History  Procedure Laterality Date  . Hernia repair    . Thyroid surgery    . Parathyroid surgery      benign tumor  . Partial nephrectomy  2005    right  . Flexible sigmoidoscopy  09/27/2012    OZH:[id number]  . Esophagogastroduodenoscopy (egd) with esophageal dilation N/A 11/21/2012    SLF: DYSPHAGIA MOST LIKELY DUE TO CANDIDA ESOPHAGITIS/Small hiatal hernia/ MODERATE Non-erosive gastritis with +H.PYLORI ON PATH  . Colonoscopy N/A 01/20/2013    SLF:9 COLON POLYPS REMOVED/MILD diverticulosis in the descending colon and sigmoid colon/MODERATE INTERNAL HEMORRHOIDS  . Esophagogastroduodenoscopy  (egd) with propofol N/A 01/27/2013    VOZ:DGUY WHITE PLAQUES IN THE ESOPHAGUS.  BRUSH BIOPSIES OBTAINED/Non-erosive gastritis (inflammation) was found in the gastric antrum; multiple bx/  . Savory dilation N/A 01/27/2013    Procedure: SAVORY DILATION;  Surgeon: West Bali, MD;  Location: [facility];  Service: Endoscopy;  Laterality: N/A;  . Esophageal biopsy N/A 01/27/2013    Procedure: BIOPSY;  Surgeon: West Bali, MD;  Location: [facility];  Service: Endoscopy;  Laterality: N/A;  Gastric Biopsies        Family History  Problem Relation Age of Onset  . Hypertension Mother   . Colon cancer Paternal Grandmother     Social History:  reports that he quit smoking about 6 months ago. His smoking use included Cigarettes. He has a 42 pack-year smoking history. He does not have any smokeless tobacco history on file. He reports that he does not drink alcohol or use illicit drugs.   Allergies:  Allergies  Allergen Reactions  . Morphine And Related Other (See Comments)    Paralysis, "shuts down kidney and bowel tract"     Medications Prior to Admission  Medication Sig Dispense Refill  . albuterol (PROVENTIL HFA;VENTOLIN HFA) 108 (90 BASE) MCG/ACT inhaler Inhale 2 puffs into the lungs every 6 (six) hours as needed. For shortness of breath       . albuterol (PROVENTIL) (2.5 MG/3ML) 0.083% nebulizer solution Take 2.5 mg by nebulization 3 (three)  times daily.      . cyclobenzaprine (FLEXERIL) 10 MG tablet Take 10 mg by mouth 2 (two) times daily.        . Fluticasone-Salmeterol (ADVAIR) 250-50 MCG/DOSE AEPB Inhale 1 puff into the lungs every 12 (twelve) hours.        . furosemide (LASIX) 40 MG tablet Take 40 mg by mouth daily as needed for fluid.      Marland Kitchen gabapentin (NEURONTIN) 300 MG capsule Take 300 mg by mouth at bedtime.      . Milnacipran (SAVELLA) 50 MG TABS Take 50 mg by mouth 2 (two) times daily.       . polyethylene glycol (MIRALAX / GLYCOLAX) packet Take 17 g by mouth daily.      .  predniSONE (DELTASONE) 10 MG tablet Take 10 mg by mouth daily.      . roflumilast (DALIRESP) 500 MCG TABS tablet Take 1 tablet (500 mcg total) by mouth daily.  30 tablet  12  . SALINE NASAL MIST NA Place 1-2 sprays into the nose as needed (nasal congestion).      Marland Kitchen tiotropium (SPIRIVA) 18 MCG inhalation capsule Place 18 mcg into inhaler and inhale daily.        Marland Kitchen acetaminophen (TYLENOL) 500 MG tablet Take 1,000 mg by mouth every 6 (six) hours as needed. For pain       . fluconazole (DIFLUCAN) 200 MG tablet 2 PO TODAY THEN 1 PO QD FOR 14 DAYS  16 tablet  0  . levofloxacin (LEVAQUIN) 500 MG tablet Take 500 mg by mouth daily.      . tadalafil (CIALIS) 5 MG tablet Take 5 mg by mouth daily as needed for erectile dysfunction.            ZOX:WRUEA from the symptoms mentioned above,there are no other symptoms referable to all systems reviewed.  Physical Exam: Blood pressure 155/90, pulse 114, temperature 98.5 F (36.9 C), temperature source Oral, resp. rate 19, height 5\' 10"  (1.778 m), weight 70.2 kg (154 lb 12.2 oz), SpO2 99.00%. He is awake and alert. He is wearing oxygen. He looks dyspneic at rest. His chest shows decreased breath sounds. His heart is regular without gallop. His abdomen is soft without masses. Extremities show no edema. Central nervous system examination is grossly intact. HEENT exam shows his mucous membranes are moist. His pupils are reactive. Tympanic membranes are intact. His neck is supple without masses bruits or JVD    Recent Labs  04/15/13 1429  WBC 9.9  NEUTROABS 8.9*  HGB 10.4*  HCT 38.8*  MCV 78.5  PLT 313    Recent Labs  04/15/13 1429  NA 141  K 4.6  CL 92*  CO2 45*  GLUCOSE 138*  BUN 13  CREATININE 1.09  CALCIUM 9.6  lablast2(ast:2,ALT:2,alkphos:2,bilitot:2,prot:2,albumin:2)@    Recent Results (from the past 240 hour(s))  MRSA PCR SCREENING     Status: None   Collection Time    04/15/13  1:38 PM      Result Value Range Status   MRSA by PCR  NEGATIVE  NEGATIVE Final   Comment:            The GeneXpert MRSA Assay (FDA     approved for NASAL specimens     only), is one component of a     comprehensive MRSA colonization     surveillance program. It is not     intended to diagnose MRSA     infection nor to guide or  monitor treatment for     MRSA infections.     Portable Chest 1 View  04/15/2013   *RADIOLOGY REPORT*  Clinical Data: Respiratory failure  PORTABLE CHEST - 1 VIEW  Comparison: Portable exam 1427 hours compared to 11/16/2012  Findings: Normal heart size, mediastinal contours, and pulmonary vascularity. Emphysematous and bronchitic changes consistent with COPD. Chronic accentuation of interstitial markings particularly in right perihilar region similar to previous exam. Bullous changes in left upper lobe laterally. No definite acute infiltrate, pleural effusion, or pneumothorax. Bones demineralized.  IMPRESSION: COPD changes with chronic accentuation of interstitial markings and left upper bullous disease. No acute abnormalities.   Original Report Authenticated By: Ulyses Southward, M.D.   Impression: He has hypoxia. This is probably exacerbation of COPD. It may be related to his oxygen delivery system. Active Problems:   * No active hospital problems. *     Plan: He will start steroids antibiotics etc. Reevaluate in the morning. He is much improved on a continuous oxygen here in the hospital with his oxygen saturation running around 100% on the same flow      Devyn Sheerin L Pager [phone]  04/15/2013, 6:00 PM

## 2013-04-16 MED ORDER — METHYLPREDNISOLONE (PAK) 4 MG PO TABS: 4.0000 mg | ORAL_TABLET | Freq: Every day | ORAL | Status: DC

## 2013-04-16 MED ORDER — CEPHALEXIN 500 MG PO CAPS: 500.0000 mg | ORAL_CAPSULE | Freq: Three times a day (TID) | ORAL | Status: DC

## 2013-04-16 MED ORDER — AZITHROMYCIN 250 MG PO TABS: ORAL_TABLET | ORAL | Status: AC

## 2013-04-16 NOTE — Progress Notes (Signed)
Patient given discharge instructions. Patient being discharged home via family. Patient alert, oriented, and in stable condition at the time of discharge. Patient ambulated in department and voided prior to discharge. Patient instructed not to use oxygen condenser at home. Patient verbalizes understanding of all discharge instructions. Patient discharged home with family on personal oxygen supply.

## 2013-04-16 NOTE — Plan of Care (Signed)
Problem: ICU Phase Progression Outcomes Goal: O2 sats trending toward baseline Outcome: Not Progressing Patient increased from 3 liters of oxygen to 5 liters of oxygen.  Has remained on Bipap for most of the day except for meals and a few hours this evening Goal: Dyspnea controlled at rest Outcome: Not Progressing Patient remains dyspneic at rest, shaky and leg cramps. Goal: Pain controlled with appropriate interventions Outcome: Completed/Met Date Met:  04/16/13 Cramping in legs relieved with vicodin 2 tabs Goal: Initial discharge plan identified Outcome: Completed/Met Date Met:  04/16/13 home

## 2013-04-16 NOTE — Progress Notes (Signed)
UR chart review completed.  

## 2013-04-16 NOTE — Progress Notes (Addendum)
Subjective: He says he feels better and he thinks he is back to baseline. His oxygenation is good but he is on 5 L of oxygen now. He had Vicodin for muscle spasm and says he thinks that's caused him to develop urinary retention.  Objective: Vital signs in last 24 hours: Temp:  [97.5 F (36.4 C)-98.5 F (36.9 C)] 97.5 F (36.4 C) (08/07 0400) Pulse Rate:  [88-115] 95 (08/07 0515) Resp:  [14-27] 18 (08/07 0515) BP: (113-159)/(58-95) 131/74 mmHg (08/07 0500) SpO2:  [83 %-100 %] 98 % (08/07 0707) Weight:  [70.2 kg (154 lb 12.2 oz)-70.8 kg (156 lb 1.4 oz)] 70.8 kg (156 lb 1.4 oz) (08/07 0500) Weight change:  Last BM Date: 04/14/13  Intake/Output from previous day: 08/06 0701 - 08/07 0700 In: 676 [P.O.:240; I.V.:136; IV Piggyback:300] Out: 1975 [Urine:1975]  PHYSICAL EXAM General appearance: alert, cooperative and mild distress Resp: Decreased breath sounds bilaterally. He has tachypnea and looks mildly dyspneic at rest Cardio: Regular without gallop but with tachycardia which is chronic GI: soft, non-tender; bowel sounds normal; no masses,  no organomegaly Extremities: extremities normal, atraumatic, no cyanosis or edema  Lab Results:    Basic Metabolic Panel:  Recent Labs  16/10/96 1429  NA 141  K 4.6  CL 92*  CO2 45*  GLUCOSE 138*  BUN 13  CREATININE 1.09  CALCIUM 9.6   Liver Function Tests:  Recent Labs  04/15/13 1429  AST 21  ALT 15  ALKPHOS 78  BILITOT 0.2*  PROT 7.3  ALBUMIN 3.6   No results found for this basename: LIPASE, AMYLASE,  in the last 72 hours No results found for this basename: AMMONIA,  in the last 72 hours CBC:  Recent Labs  04/15/13 1429  WBC 9.9  NEUTROABS 8.9*  HGB 10.4*  HCT 38.8*  MCV 78.5  PLT 313   Cardiac Enzymes: No results found for this basename: CKTOTAL, CKMB, CKMBINDEX, TROPONINI,  in the last 72 hours BNP: No results found for this basename: PROBNP,  in the last 72 hours D-Dimer: No results found for this  basename: DDIMER,  in the last 72 hours CBG: No results found for this basename: GLUCAP,  in the last 72 hours Hemoglobin A1C: No results found for this basename: HGBA1C,  in the last 72 hours Fasting Lipid Panel: No results found for this basename: CHOL, HDL, LDLCALC, TRIG, CHOLHDL, LDLDIRECT,  in the last 72 hours Thyroid Function Tests: No results found for this basename: TSH, T4TOTAL, FREET4, T3FREE, THYROIDAB,  in the last 72 hours Anemia Panel: No results found for this basename: VITAMINB12, FOLATE, FERRITIN, TIBC, IRON, RETICCTPCT,  in the last 72 hours Coagulation: No results found for this basename: LABPROT, INR,  in the last 72 hours Urine Drug Screen: Drugs of Abuse  No results found for this basename: labopia, cocainscrnur, labbenz, amphetmu, thcu, labbarb    Alcohol Level: No results found for this basename: ETH,  in the last 72 hours Urinalysis: No results found for this basename: COLORURINE, APPERANCEUR, LABSPEC, PHURINE, GLUCOSEU, HGBUR, BILIRUBINUR, KETONESUR, PROTEINUR, UROBILINOGEN, NITRITE, LEUKOCYTESUR,  in the last 72 hours Misc. Labs:  ABGS  Recent Labs  04/15/13 1315  PHART 7.345*  PO2ART 44.4*  TCO2 38.7  HCO3 41.2*   CULTURES Recent Results (from the past 240 hour(s))  MRSA PCR SCREENING     Status: None   Collection Time    04/15/13  1:38 PM      Result Value Range Status   MRSA by PCR  NEGATIVE  NEGATIVE Final   Comment:            The GeneXpert MRSA Assay (FDA     approved for NASAL specimens     only), is one component of a     comprehensive MRSA colonization     surveillance program. It is not     intended to diagnose MRSA     infection nor to guide or     monitor treatment for     MRSA infections.   Studies/Results: Portable Chest 1 View  04/15/2013   *RADIOLOGY REPORT*  Clinical Data: Respiratory failure  PORTABLE CHEST - 1 VIEW  Comparison: Portable exam 1427 hours compared to 11/16/2012  Findings: Normal heart size, mediastinal  contours, and pulmonary vascularity. Emphysematous and bronchitic changes consistent with COPD. Chronic accentuation of interstitial markings particularly in right perihilar region similar to previous exam. Bullous changes in left upper lobe laterally. No definite acute infiltrate, pleural effusion, or pneumothorax. Bones demineralized.  IMPRESSION: COPD changes with chronic accentuation of interstitial markings and left upper bullous disease. No acute abnormalities.   Original Report Authenticated By: Ulyses Southward, M.D.    Medications:  Prior to Admission:  Prescriptions prior to admission  Medication Sig Dispense Refill  . albuterol (PROVENTIL HFA;VENTOLIN HFA) 108 (90 BASE) MCG/ACT inhaler Inhale 2 puffs into the lungs every 6 (six) hours as needed. For shortness of breath       . albuterol (PROVENTIL) (2.5 MG/3ML) 0.083% nebulizer solution Take 2.5 mg by nebulization 3 (three) times daily.      . cyclobenzaprine (FLEXERIL) 10 MG tablet Take 10 mg by mouth 2 (two) times daily.        . Fluticasone-Salmeterol (ADVAIR) 250-50 MCG/DOSE AEPB Inhale 1 puff into the lungs every 12 (twelve) hours.        . furosemide (LASIX) 40 MG tablet Take 40 mg by mouth daily as needed for fluid.      Marland Kitchen gabapentin (NEURONTIN) 300 MG capsule Take 300 mg by mouth at bedtime.      . Milnacipran (SAVELLA) 50 MG TABS Take 50 mg by mouth 2 (two) times daily.       . polyethylene glycol (MIRALAX / GLYCOLAX) packet Take 17 g by mouth daily.      . predniSONE (DELTASONE) 10 MG tablet Take 10 mg by mouth daily.      . roflumilast (DALIRESP) 500 MCG TABS tablet Take 1 tablet (500 mcg total) by mouth daily.  30 tablet  12  . SALINE NASAL MIST NA Place 1-2 sprays into the nose as needed (nasal congestion).      Marland Kitchen tiotropium (SPIRIVA) 18 MCG inhalation capsule Place 18 mcg into inhaler and inhale daily.        Marland Kitchen acetaminophen (TYLENOL) 500 MG tablet Take 1,000 mg by mouth every 6 (six) hours as needed. For pain       .  fluconazole (DIFLUCAN) 200 MG tablet 2 PO TODAY THEN 1 PO QD FOR 14 DAYS  16 tablet  0  . levofloxacin (LEVAQUIN) 500 MG tablet Take 500 mg by mouth daily.      . tadalafil (CIALIS) 5 MG tablet Take 5 mg by mouth daily as needed for erectile dysfunction.        Scheduled: . albuterol  2.5 mg Nebulization Q6H  . azithromycin  500 mg Intravenous Q24H  . cefTRIAXone (ROCEPHIN)  IV  1 g Intravenous Q24H  . docusate sodium  100 mg Oral BID  .  enoxaparin (LOVENOX) injection  40 mg Subcutaneous Q24H  . finasteride  5 mg Oral Daily  . furosemide  40 mg Oral Daily  . gabapentin  300 mg Oral QHS  . guaiFENesin  1,200 mg Oral BID  . methylPREDNISolone (SOLU-MEDROL) injection  125 mg Intravenous Q6H  . Milnacipran  50 mg Oral BID  . mometasone-formoterol  2 puff Inhalation BID  . pantoprazole  40 mg Oral Daily  . polyethylene glycol  17 g Oral Daily  . roflumilast  500 mcg Oral Daily  . sodium chloride  3 mL Intravenous Q12H  . sodium chloride  3 mL Intravenous Q12H  . tiotropium  18 mcg Inhalation Daily   Continuous:  ZOX:WRUEAV chloride, acetaminophen, acetaminophen, albuterol, alum & mag hydroxide-simeth, HYDROcodone-acetaminophen, ondansetron (ZOFRAN) IV, ondansetron, sodium chloride  Assesment: He was admitted with acute on chronic hypoxic respiratory failure. This may be related to his oxygen delivery device but I think he's probably having an exacerbation of COPD although he says he feels pretty well. He has chronic tachycardia unchanged. He has muscle cramps and that may be related to his medications. He has acute urinary retention. This happened to him in the past when he has been in the hospital. He has mild anemia. This is likely related to chronic disease but he did have some blood loss about 6 months ag and may not have repleted his iron stores  Active Problems:   * No active hospital problems. *    Plan: I'm going to get him up and try to get him moving around and see what he does  with his oxygen etc. If he does well I think he may be able to go home. Once he is up he may be able to urinate but if not he'll need in and out catheterization and he's going to have to be able to urinate spontaneously before he can go home or he will have to go home with a Foley catheter    LOS: 1 day   Fallan Mccarey L 04/16/2013, 8:23 AM

## 2013-04-19 NOTE — Discharge Summary (Signed)
Physician Discharge Summary  Patient ID: Connor Armstrong MRN: [id number] DOB/AGE: 1951/07/14 62 y.o. Primary Care Physician:Tito Ausmus L, MD Admit date: 04/15/2013 Discharge date: 04/19/2013    Discharge Diagnoses:   Principal Problem:   Acute-on-chronic respiratory failure Active Problems:   Acute exacerbation of chronic obstructive pulmonary disease (COPD)   Hypertension   Fibromyalgia   Anemia     Medication List         acetaminophen 500 MG tablet  Commonly known as:  TYLENOL  Take 1,000 mg by mouth every 6 (six) hours as needed. For pain     albuterol 108 (90 BASE) MCG/ACT inhaler  Commonly known as:  PROVENTIL HFA;VENTOLIN HFA  Inhale 2 puffs into the lungs every 6 (six) hours as needed. For shortness of breath     albuterol (2.5 MG/3ML) 0.083% nebulizer solution  Commonly known as:  PROVENTIL  Take 2.5 mg by nebulization 3 (three) times daily.     azithromycin 250 MG tablet  Commonly known as:  ZITHROMAX Z-PAK  Take 2 tablets (500 mg) on  Day 1,  followed by 1 tablet (250 mg) once daily on Days 2 through 5.     cephALEXin 500 MG capsule  Commonly known as:  KEFLEX  Take 1 capsule (500 mg total) by mouth 3 (three) times daily.     cyclobenzaprine 10 MG tablet  Commonly known as:  FLEXERIL  Take 10 mg by mouth 2 (two) times daily.     fluconazole 200 MG tablet  Commonly known as:  DIFLUCAN  2 PO TODAY THEN 1 PO QD FOR 14 DAYS     Fluticasone-Salmeterol 250-50 MCG/DOSE Aepb  Commonly known as:  ADVAIR  Inhale 1 puff into the lungs every 12 (twelve) hours.     furosemide 40 MG tablet  Commonly known as:  LASIX  Take 40 mg by mouth daily as needed for fluid.     gabapentin 300 MG capsule  Commonly known as:  NEURONTIN  Take 300 mg by mouth at bedtime.     levofloxacin 500 MG tablet  Commonly known as:  LEVAQUIN  Take 500 mg by mouth daily.     methylPREDNIsolone 4 MG tablet  Commonly known as:  MEDROL DOSPACK  Take 1 tablet (4 mg total) by  mouth daily. follow package directions     Milnacipran 50 MG Tabs tablet  Commonly known as:  SAVELLA  Take 50 mg by mouth 2 (two) times daily.     polyethylene glycol packet  Commonly known as:  MIRALAX / GLYCOLAX  Take 17 g by mouth daily.     predniSONE 10 MG tablet  Commonly known as:  DELTASONE  Take 10 mg by mouth daily.     roflumilast 500 MCG Tabs tablet  Commonly known as:  DALIRESP  Take 1 tablet (500 mcg total) by mouth daily.     SALINE NASAL MIST NA  Place 1-2 sprays into the nose as needed (nasal congestion).     tadalafil 5 MG tablet  Commonly known as:  CIALIS  Take 5 mg by mouth daily as needed for erectile dysfunction.     tiotropium 18 MCG inhalation capsule  Commonly known as:  SPIRIVA  Place 18 mcg into inhaler and inhale daily.        Discharged Condition: Improved    Consults: None  Significant Diagnostic Studies: Portable Chest 1 View  04/15/2013   *RADIOLOGY REPORT*  Clinical Data: Respiratory failure  PORTABLE CHEST - 1 VIEW  Comparison: Portable exam 1427 hours compared to 11/16/2012  Findings: Normal heart size, mediastinal contours, and pulmonary vascularity. Emphysematous and bronchitic changes consistent with COPD. Chronic accentuation of interstitial markings particularly in right perihilar region similar to previous exam. Bullous changes in left upper lobe laterally. No definite acute infiltrate, pleural effusion, or pneumothorax. Bones demineralized.  IMPRESSION: COPD changes with chronic accentuation of interstitial markings and left upper bullous disease. No acute abnormalities.   Original Report Authenticated By: Ulyses Southward, M.D.    Lab Results: Basic Metabolic Panel: No results found for this basename: NA, K, CL, CO2, GLUCOSE, BUN, CREATININE, CALCIUM, MG, PHOS,  in the last 72 hours Liver Function Tests: No results found for this basename: AST, ALT, ALKPHOS, BILITOT, PROT, ALBUMIN,  in the last 72 hours   CBC: No results found  for this basename: WBC, NEUTROABS, HGB, HCT, MCV, PLT,  in the last 72 hours  Recent Results (from the past 240 hour(s))  MRSA PCR SCREENING     Status: None   Collection Time    04/15/13  1:38 PM      Result Value Range Status   MRSA by PCR NEGATIVE  NEGATIVE Final   Comment:            The GeneXpert MRSA Assay (FDA     approved for NASAL specimens     only), is one component of a     comprehensive MRSA colonization     surveillance program. It is not     intended to diagnose MRSA     infection nor to guide or     monitor treatment for     MRSA infections.     Hospital Course: He had come to my office on the day of admission with increasing shortness of breath. He had been having myoclonic jerks in the last time he had that it was because he had a markedly elevated PCO2. I sent him for blood gas and his blood gas on oxygen showed his PO2 was in the 40s. It should be noted that he was on oxygen conserving device at that time. His PCO2 was at baseline. He was admitted to the intensive care unit and step down and placed on intravenous steroids and antibiotics. When he was placed on oxygen with no oxygen conserving device his O2 saturation remained in the low 90s. He felt well. He was discharged home the next day told to remove his oxygen conserving device  Discharge Exam: Blood pressure 134/78, pulse 106, temperature 97.7 F (36.5 C), temperature source Oral, resp. rate 14, height 5\' 10"  (1.778 m), weight 70.8 kg (156 lb 1.4 oz), SpO2 94.00%. He is awake and alert. He has dyspnea at rest and this is chronic. His chest shows decreased breath sounds his heart is regular with mild tachycardia  Disposition: Home. He will continue oxygen nebulizer and he will take antibiotics and steroids at home. He will discontinue the oxygen conserving device      Discharge Orders   Future Appointments Provider Department Dept Phone   04/20/2013 1:00 PM [facility] [place] [facility] [phone]   04/22/2013 1:00 PM [facility] [place] [facility] [phone]   04/27/2013 1:00 PM [facility] [place] [facility] [phone]   04/29/2013 10:30 AM Nira Retort, NP [facility] [phone]   04/29/2013 1:00 PM [facility] [place] [facility] [phone]   05/04/2013 1:00 PM [facility] [place] [facility] [phone]  05/06/2013 1:00 PM [facility] [place] [facility] [phone]   05/11/2013 1:00 PM [facility] [place] [facility] [phone]   05/13/2013 1:00 PM [facility] [place] [facility] [phone]   Future Orders Complete By Expires     Discharge patient  As directed          Signed: Luvada Salamone L Pager [phone]  04/19/2013, 9:18 AM

## 2013-04-20 ENCOUNTER — Encounter (HOSPITAL_COMMUNITY)
Admission: RE | Admit: 2013-04-20 | Discharge: 2013-04-20 | Disposition: A | Discharge status: Home / Self Care | Payer: Medicare Other | Source: Ambulatory Visit | Attending: Pulmonary Disease | Admitting: Pulmonary Disease

## 2013-04-22 ENCOUNTER — Encounter (HOSPITAL_COMMUNITY)
Admission: RE | Admit: 2013-04-22 | Discharge: 2013-04-22 | Disposition: A | Discharge status: Home / Self Care | Payer: Medicare Other | Source: Ambulatory Visit | Attending: Pulmonary Disease | Admitting: Pulmonary Disease

## 2013-04-27 ENCOUNTER — Encounter (HOSPITAL_COMMUNITY): Payer: Medicare Other

## 2013-04-29 ENCOUNTER — Ambulatory Visit: Payer: Medicare Other | Admitting: Gastroenterology

## 2013-04-29 ENCOUNTER — Encounter (HOSPITAL_COMMUNITY): Payer: Medicare Other

## 2013-04-29 ENCOUNTER — Telehealth: Payer: Self-pay | Admitting: *Deleted

## 2013-04-29 NOTE — Telephone Encounter (Signed)
Pt is a no a no show

## 2013-04-29 NOTE — Telephone Encounter (Signed)
Please send note for f/u 

## 2013-04-30 ENCOUNTER — Encounter: Payer: Self-pay | Admitting: *Deleted

## 2013-04-30 NOTE — Telephone Encounter (Signed)
Letter sent to pt

## 2013-05-04 ENCOUNTER — Encounter (HOSPITAL_COMMUNITY): Payer: Medicare Other

## 2013-05-06 ENCOUNTER — Encounter (HOSPITAL_COMMUNITY): Payer: Medicare Other

## 2013-05-11 ENCOUNTER — Encounter (HOSPITAL_COMMUNITY): Payer: Medicare Other

## 2013-05-13 ENCOUNTER — Encounter (HOSPITAL_COMMUNITY): Payer: Medicare Other

## 2013-05-19 ENCOUNTER — Inpatient Hospital Stay (HOSPITAL_COMMUNITY)
Admission: EM | Admit: 2013-05-19 | Discharge: 2013-05-22 | DRG: 190 | Disposition: A | Discharge status: Home Health | Payer: Medicare Other | Attending: Pulmonary Disease | Admitting: Pulmonary Disease

## 2013-05-19 ENCOUNTER — Emergency Department (HOSPITAL_COMMUNITY): Payer: Medicare Other

## 2013-05-19 ENCOUNTER — Encounter (HOSPITAL_COMMUNITY): Payer: Self-pay | Admitting: *Deleted

## 2013-05-19 DIAGNOSIS — Z85528 Personal history of other malignant neoplasm of kidney: Secondary | ICD-10-CM

## 2013-05-19 DIAGNOSIS — R339 Retention of urine, unspecified: Secondary | ICD-10-CM | POA: Diagnosis present

## 2013-05-19 DIAGNOSIS — J441 Chronic obstructive pulmonary disease with (acute) exacerbation: Principal | ICD-10-CM

## 2013-05-19 DIAGNOSIS — D649 Anemia, unspecified: Secondary | ICD-10-CM | POA: Diagnosis present

## 2013-05-19 DIAGNOSIS — D638 Anemia in other chronic diseases classified elsewhere: Secondary | ICD-10-CM | POA: Diagnosis present

## 2013-05-19 DIAGNOSIS — I10 Essential (primary) hypertension: Secondary | ICD-10-CM

## 2013-05-19 DIAGNOSIS — F411 Generalized anxiety disorder: Secondary | ICD-10-CM

## 2013-05-19 DIAGNOSIS — IMO0002 Reserved for concepts with insufficient information to code with codable children: Secondary | ICD-10-CM

## 2013-05-19 DIAGNOSIS — Z905 Acquired absence of kidney: Secondary | ICD-10-CM

## 2013-05-19 DIAGNOSIS — IMO0001 Reserved for inherently not codable concepts without codable children: Secondary | ICD-10-CM | POA: Diagnosis present

## 2013-05-19 DIAGNOSIS — Z79899 Other long term (current) drug therapy: Secondary | ICD-10-CM

## 2013-05-19 DIAGNOSIS — R338 Other retention of urine: Secondary | ICD-10-CM | POA: Diagnosis not present

## 2013-05-19 DIAGNOSIS — M797 Fibromyalgia: Secondary | ICD-10-CM

## 2013-05-19 DIAGNOSIS — J9620 Acute and chronic respiratory failure, unspecified whether with hypoxia or hypercapnia: Secondary | ICD-10-CM

## 2013-05-19 DIAGNOSIS — F17200 Nicotine dependence, unspecified, uncomplicated: Secondary | ICD-10-CM | POA: Diagnosis present

## 2013-05-19 LAB — MRSA PCR SCREENING: MRSA by PCR: NEGATIVE

## 2013-05-19 LAB — BLOOD GAS, ARTERIAL
Drawn by: 23534
Expiratory PAP: 6
Inspiratory PAP: 18
Mode: POSITIVE
O2 Content: 3 L/min
O2 Saturation: 81.7 %
Patient temperature: 37
pCO2 arterial: 93.2 mmHg (ref 35.0–45.0)
pH, Arterial: 7.315 — ABNORMAL LOW (ref 7.350–7.450)
pO2, Arterial: 69 mmHg — ABNORMAL LOW (ref 80.0–100.0)

## 2013-05-19 LAB — CBC WITH DIFFERENTIAL/PLATELET
Basophils Absolute: 0 10*3/uL (ref 0.0–0.1)
Eosinophils Absolute: 0 10*3/uL (ref 0.0–0.7)
Eosinophils Relative: 0 % (ref 0–5)
Lymphocytes Relative: 5 % — ABNORMAL LOW (ref 12–46)
MCH: 22.1 pg — ABNORMAL LOW (ref 26.0–34.0)
MCV: 84.1 fL (ref 78.0–100.0)
Platelets: 231 10*3/uL (ref 150–400)
RDW: 19.6 % — ABNORMAL HIGH (ref 11.5–15.5)
WBC: 11.3 10*3/uL — ABNORMAL HIGH (ref 4.0–10.5)

## 2013-05-19 LAB — BASIC METABOLIC PANEL
Calcium: 9.5 mg/dL (ref 8.4–10.5)
GFR calc Af Amer: >90 mL/min (ref >90)
GFR calc non Af Amer: 87 mL/min — ABNORMAL LOW (ref >90)
Sodium: 140 mEq/L (ref 135–145)

## 2013-05-19 MED ORDER — METHYLPREDNISOLONE SODIUM SUCC 125 MG IJ SOLR
60.0000 mg | Freq: Four times a day (QID) | INTRAMUSCULAR | Status: DC
  Administered 2013-05-19 – 2013-05-22 (×10): 60 mg via INTRAVENOUS
  Filled 2013-05-19 (×12): qty 2

## 2013-05-19 MED ORDER — ALBUTEROL SULFATE (5 MG/ML) 0.5% IN NEBU
2.5000 mg | INHALATION_SOLUTION | RESPIRATORY_TRACT | Status: DC
  Administered 2013-05-19 – 2013-05-22 (×18): 2.5 mg via RESPIRATORY_TRACT
  Filled 2013-05-19 (×17): qty 0.5

## 2013-05-19 MED ORDER — ALBUTEROL SULFATE (5 MG/ML) 0.5% IN NEBU
5.0000 mg | INHALATION_SOLUTION | Freq: Once | RESPIRATORY_TRACT | Status: AC
  Administered 2013-05-19: 5 mg via RESPIRATORY_TRACT
  Filled 2013-05-19: qty 1

## 2013-05-19 MED ORDER — BIOTENE DRY MOUTH MT LIQD
15.0000 mL | Freq: Two times a day (BID) | OROMUCOSAL | Status: DC
  Administered 2013-05-20: 15 mL via OROMUCOSAL

## 2013-05-19 MED ORDER — HYDROCODONE-ACETAMINOPHEN 5-325 MG PO TABS
1.0000 | ORAL_TABLET | Freq: Four times a day (QID) | ORAL | Status: DC | PRN
  Administered 2013-05-19 – 2013-05-20 (×3): 1 via ORAL
  Filled 2013-05-19 (×3): qty 1

## 2013-05-19 MED ORDER — ALBUTEROL SULFATE (5 MG/ML) 0.5% IN NEBU
INHALATION_SOLUTION | RESPIRATORY_TRACT | Status: AC
  Filled 2013-05-19: qty 1

## 2013-05-19 MED ORDER — ALBUTEROL SULFATE (5 MG/ML) 0.5% IN NEBU: 2.5000 mg | INHALATION_SOLUTION | RESPIRATORY_TRACT | Status: DC | PRN

## 2013-05-19 MED ORDER — BISACODYL 10 MG RE SUPP: 10.0000 mg | Freq: Every day | RECTAL | Status: DC | PRN

## 2013-05-19 MED ORDER — CHLORHEXIDINE GLUCONATE 0.12 % MT SOLN
15.0000 mL | Freq: Two times a day (BID) | OROMUCOSAL | Status: DC
  Administered 2013-05-19 – 2013-05-20 (×2): 15 mL via OROMUCOSAL
  Filled 2013-05-19 (×2): qty 15

## 2013-05-19 MED ORDER — GUAIFENESIN-DM 100-10 MG/5ML PO SYRP
5.0000 mL | ORAL_SOLUTION | ORAL | Status: DC | PRN
  Administered 2013-05-19: 5 mL via ORAL
  Filled 2013-05-19: qty 5

## 2013-05-19 MED ORDER — ALBUTEROL SULFATE HFA 108 (90 BASE) MCG/ACT IN AERS: 2.0000 | INHALATION_SPRAY | Freq: Four times a day (QID) | RESPIRATORY_TRACT | Status: DC

## 2013-05-19 MED ORDER — IPRATROPIUM BROMIDE 0.02 % IN SOLN
0.5000 mg | Freq: Once | RESPIRATORY_TRACT | Status: AC
  Administered 2013-05-19: 0.5 mg via RESPIRATORY_TRACT
  Filled 2013-05-19: qty 2.5

## 2013-05-19 MED ORDER — LORAZEPAM 2 MG/ML IJ SOLN
0.5000 mg | Freq: Four times a day (QID) | INTRAMUSCULAR | Status: DC | PRN
  Administered 2013-05-19 – 2013-05-20 (×4): 0.5 mg via INTRAVENOUS
  Filled 2013-05-19 (×4): qty 1

## 2013-05-19 MED ORDER — LEVOFLOXACIN IN D5W 500 MG/100ML IV SOLN
500.0000 mg | INTRAVENOUS | Status: DC
  Administered 2013-05-19 – 2013-05-21 (×3): 500 mg via INTRAVENOUS
  Filled 2013-05-19 (×6): qty 100

## 2013-05-19 MED ORDER — HYDROCHLOROTHIAZIDE 12.5 MG PO CAPS
12.5000 mg | ORAL_CAPSULE | Freq: Every day | ORAL | Status: DC
  Administered 2013-05-19 – 2013-05-22 (×3): 12.5 mg via ORAL
  Filled 2013-05-19 (×4): qty 1

## 2013-05-19 MED ORDER — MOMETASONE FURO-FORMOTEROL FUM 100-5 MCG/ACT IN AERO
2.0000 | INHALATION_SPRAY | Freq: Two times a day (BID) | RESPIRATORY_TRACT | Status: DC
  Administered 2013-05-19 – 2013-05-22 (×6): 2 via RESPIRATORY_TRACT
  Filled 2013-05-19: qty 8.8

## 2013-05-19 MED ORDER — IPRATROPIUM BROMIDE 0.02 % IN SOLN
RESPIRATORY_TRACT | Status: AC
  Filled 2013-05-19: qty 2.5

## 2013-05-19 MED ORDER — IPRATROPIUM BROMIDE 0.02 % IN SOLN: 0.5000 mg | Freq: Once | RESPIRATORY_TRACT | Status: AC

## 2013-05-19 MED ORDER — ONDANSETRON HCL 4 MG PO TABS: 4.0000 mg | ORAL_TABLET | Freq: Four times a day (QID) | ORAL | Status: DC | PRN

## 2013-05-19 MED ORDER — IPRATROPIUM BROMIDE 0.02 % IN SOLN
0.5000 mg | RESPIRATORY_TRACT | Status: DC
  Administered 2013-05-19 – 2013-05-22 (×18): 0.5 mg via RESPIRATORY_TRACT
  Filled 2013-05-19 (×17): qty 2.5

## 2013-05-19 MED ORDER — SODIUM CHLORIDE 0.9 % IV SOLN
INTRAVENOUS | Status: DC
  Administered 2013-05-19 – 2013-05-20 (×2): via INTRAVENOUS

## 2013-05-19 MED ORDER — MILNACIPRAN HCL 50 MG PO TABS
50.0000 mg | ORAL_TABLET | Freq: Two times a day (BID) | ORAL | Status: DC
Start: 2013-05-19 — End: 2013-05-22
  Administered 2013-05-19 – 2013-05-22 (×7): 50 mg via ORAL
  Filled 2013-05-19 (×13): qty 1

## 2013-05-19 MED ORDER — ALBUTEROL SULFATE (5 MG/ML) 0.5% IN NEBU
5.0000 mg | INHALATION_SOLUTION | Freq: Once | RESPIRATORY_TRACT | Status: AC
  Administered 2013-05-19: 5 mg via RESPIRATORY_TRACT

## 2013-05-19 MED ORDER — GABAPENTIN 300 MG PO CAPS
300.0000 mg | ORAL_CAPSULE | Freq: Every day | ORAL | Status: DC
  Administered 2013-05-19 – 2013-05-21 (×3): 300 mg via ORAL
  Filled 2013-05-19 (×3): qty 1

## 2013-05-19 MED ORDER — FLEET ENEMA 7-19 GM/118ML RE ENEM: 1.0000 | ENEMA | Freq: Once | RECTAL | Status: AC | PRN

## 2013-05-19 MED ORDER — ENOXAPARIN SODIUM 40 MG/0.4ML ~~LOC~~ SOLN
40.0000 mg | SUBCUTANEOUS | Status: DC
  Administered 2013-05-19 – 2013-05-21 (×3): 40 mg via SUBCUTANEOUS
  Filled 2013-05-19 (×3): qty 0.4

## 2013-05-19 MED ORDER — ROFLUMILAST 500 MCG PO TABS
500.0000 ug | ORAL_TABLET | Freq: Every day | ORAL | Status: DC
Start: 2013-05-19 — End: 2013-05-22
  Administered 2013-05-19 – 2013-05-22 (×4): 500 ug via ORAL
  Filled 2013-05-19 (×7): qty 1

## 2013-05-19 MED ORDER — ACETAMINOPHEN 650 MG RE SUPP: 650.0000 mg | Freq: Four times a day (QID) | RECTAL | Status: DC | PRN

## 2013-05-19 MED ORDER — FUROSEMIDE 40 MG PO TABS
40.0000 mg | ORAL_TABLET | Freq: Every day | ORAL | Status: DC
  Administered 2013-05-19 – 2013-05-22 (×3): 40 mg via ORAL
  Filled 2013-05-19 (×4): qty 1

## 2013-05-19 MED ORDER — SENNA 8.6 MG PO TABS
1.0000 | ORAL_TABLET | Freq: Two times a day (BID) | ORAL | Status: DC
  Administered 2013-05-19 – 2013-05-22 (×6): 8.6 mg via ORAL
  Filled 2013-05-19 (×7): qty 1

## 2013-05-19 MED ORDER — LISINOPRIL 10 MG PO TABS
20.0000 mg | ORAL_TABLET | Freq: Every day | ORAL | Status: DC
  Administered 2013-05-19 – 2013-05-22 (×3): 20 mg via ORAL
  Filled 2013-05-19 (×4): qty 2

## 2013-05-19 MED ORDER — LORAZEPAM 2 MG/ML IJ SOLN
2.0000 mg | Freq: Once | INTRAMUSCULAR | Status: AC
  Administered 2013-05-19: 2 mg via INTRAVENOUS
  Filled 2013-05-19: qty 1

## 2013-05-19 MED ORDER — METHYLPREDNISOLONE SODIUM SUCC 125 MG IJ SOLR: 60.0000 mg | Freq: Four times a day (QID) | INTRAMUSCULAR | Status: DC

## 2013-05-19 MED ORDER — LISINOPRIL-HYDROCHLOROTHIAZIDE 20-12.5 MG PO TABS: 1.0000 | ORAL_TABLET | Freq: Every day | ORAL | Status: DC

## 2013-05-19 MED ORDER — METHYLPREDNISOLONE SODIUM SUCC 125 MG IJ SOLR
125.0000 mg | Freq: Once | INTRAMUSCULAR | Status: AC
  Administered 2013-05-19: 125 mg via INTRAVENOUS
  Filled 2013-05-19: qty 2

## 2013-05-19 MED ORDER — ALUM & MAG HYDROXIDE-SIMETH 200-200-20 MG/5ML PO SUSP: 30.0000 mL | Freq: Four times a day (QID) | ORAL | Status: DC | PRN

## 2013-05-19 MED ORDER — CYCLOBENZAPRINE HCL 10 MG PO TABS
10.0000 mg | ORAL_TABLET | Freq: Two times a day (BID) | ORAL | Status: DC | PRN
  Administered 2013-05-19 – 2013-05-21 (×4): 10 mg via ORAL
  Filled 2013-05-19 (×4): qty 1

## 2013-05-19 MED ORDER — HYDROMORPHONE HCL PF 1 MG/ML IJ SOLN
0.5000 mg | INTRAMUSCULAR | Status: DC | PRN
  Administered 2013-05-19: 0.5 mg via INTRAVENOUS
  Filled 2013-05-19: qty 1

## 2013-05-19 MED ORDER — ACETAMINOPHEN 325 MG PO TABS: 650.0000 mg | ORAL_TABLET | Freq: Four times a day (QID) | ORAL | Status: DC | PRN

## 2013-05-19 MED ORDER — ONDANSETRON HCL 4 MG/2ML IJ SOLN: 4.0000 mg | Freq: Four times a day (QID) | INTRAMUSCULAR | Status: DC | PRN

## 2013-05-19 NOTE — H&P (Signed)
[facility] History and Physical  Connor Armstrong WUJ:[id number] DOB: [birth year] DOA: 05/19/2013  Referring physician:  PCP: Fredirick Maudlin, MD  Specialists:   Chief Complaint:   HPI: Connor Armstrong is a 62 y.o. male with a past medical history that includes chronic respiratory failure, COPD, fibromyalgia, hypertension, who presents to the emergency department with the chief complaint of shortness of breath. Information is obtained from the patient and his wife is at the bedside. They report that the patient was in his usual state of health until this morning around 1:30 AM when he awakened and took off his BiPAP machine. He indicated to his wife that he was feeling anxious and could not wear it any longer. He did not complain shortness of breath or worsening shortness of breath at that time. He went back to sleep and when he awakened this morning he indicated that his breathing was "a little worse". Wife indicates that she did not notice any difference in his respiratory effort. At this time she checked his oxygen saturation level on his 3 L of home oxygen and it was over 90%. In the kitchen a few minutes later she checked his oxygen saturation level again and it had dropped to the 80s. She then called EMS. EMS reported patient was cyanotic when they arrived and not moving air particularly on the left side his oxygen saturation level at that time was 86. He got 2 breathing treatments in the field and was merely put on BiPAP and brought to the emergency department. Patient denies any chest pain palpitations or worsening cough. He denies fever chills nausea or sick contacts. Wife reports patient has been eating and drinking as normal amount and there has been no unintentional weight loss. Patient denies abdominal pain diarrhea constipation melena dysuria hematuria frequency or urgency. He does indicate that he started smoking last week after not smoking for the last 9 months. When asked why he  started smoking he indicated "my nerves". Lab work in the emergency department is significant for white count of 11.3 hemoglobin 10.0 chloride 92 CO2 greater than 45. Arterial blood gas on 3 L nasal cannula yields a pH of 7.23 a PCO2 of 111 and O2 of 49.8 bicarbonate 45.1. Chest x-ray yields Right hilar prominence is felt to be vascular and is unchanged. Fibrotic changes are present in the lower lungs. Bullous emphysematous changes are present. No pulmonary edema, consolidation, mass, or pleural effusion is evident. There is generalized hyperinflation. Patient was placed on BiPAP and given Solu-Medrol in the emergency department [facility] were asked to admit    Review of Systems: 14 point review of systems conducted and all systems are negative except as indicated in the history of present illness  Past Medical History  Diagnosis Date  . COPD (chronic obstructive pulmonary disease)   . Fibromyalgia   . Shingles   . Cancer     kidney  . Helicobacter pylori gastritis March 2014  . Candida esophagitis March 2014  . On supplemental oxygen therapy     2-3L/min and on BPAP @ HS  . Hypertension     "he doesnt take medication anymore because no longer has high blood pressure" per family   Past Surgical History  Procedure Laterality Date  . Hernia repair    . Thyroid surgery    . Parathyroid surgery      benign tumor  . Partial nephrectomy  2005    right  . Flexible sigmoidoscopy  09/27/2012  ZOX:[id number]  . Esophagogastroduodenoscopy (egd) with esophageal dilation N/A 11/21/2012    SLF: DYSPHAGIA MOST LIKELY DUE TO CANDIDA ESOPHAGITIS/Small hiatal hernia/ MODERATE Non-erosive gastritis with +H.PYLORI ON PATH  . Colonoscopy N/A 01/20/2013    SLF:9 COLON POLYPS REMOVED/MILD diverticulosis in the descending colon and sigmoid colon/MODERATE INTERNAL HEMORRHOIDS  . Esophagogastroduodenoscopy (egd) with propofol N/A 01/27/2013    QMV:HQIO WHITE PLAQUES IN THE ESOPHAGUS.   BRUSH BIOPSIES OBTAINED/Non-erosive gastritis (inflammation) was found in the gastric antrum; multiple bx/  . Savory dilation N/A 01/27/2013    Procedure: SAVORY DILATION;  Surgeon: West Bali, MD;  Location: [facility];  Service: Endoscopy;  Laterality: N/A;  . Esophageal biopsy N/A 01/27/2013    Procedure: BIOPSY;  Surgeon: West Bali, MD;  Location: [facility];  Service: Endoscopy;  Laterality: N/A;  Gastric Biopsies   Social History:  reports that he quit smoking about 7 months ago. His smoking use included Cigarettes. He has a 42 pack-year smoking history. He does not have any smokeless tobacco history on file. He reports that he does not drink alcohol or use illicit drugs. Patient is married and lives with his wife at home. He just recently resumed smoking after a nine-month hiatus. He is a retired Scientist, product/process development. Allergies  Allergen Reactions  . Morphine And Related Other (See Comments)    Paralysis, "shuts down kidney and bowel tract"     Family History  Problem Relation Age of Onset  . Hypertension Mother   . Colon cancer Paternal Grandmother      Prior to Admission medications   Medication Sig Start Date End Date Taking? Authorizing Provider  albuterol (PROVENTIL HFA;VENTOLIN HFA) 108 (90 BASE) MCG/ACT inhaler Inhale 2 puffs into the lungs every 6 (six) hours as needed. For shortness of breath    Yes Historical Provider, MD  albuterol (PROVENTIL) (2.5 MG/3ML) 0.083% nebulizer solution Take 2.5 mg by nebulization 3 (three) times daily. 12/14/11  Yes Fredirick Maudlin, MD  cyclobenzaprine (FLEXERIL) 10 MG tablet Take 10 mg by mouth 2 (two) times daily as needed.    Yes Historical Provider, MD  Dextromethorphan-Guaifenesin (MUCINEX DM) 30-600 MG TB12 Take 1 tablet by mouth daily.   Yes Historical Provider, MD  Fluticasone Furoate-Vilanterol (BREO ELLIPTA) 100-25 MCG/INH AEPB Inhale into the lungs.   Yes Historical Provider, MD  Fluticasone-Salmeterol (ADVAIR) 250-50 MCG/DOSE AEPB Inhale  1 puff into the lungs every 12 (twelve) hours.     Yes Historical Provider, MD  furosemide (LASIX) 40 MG tablet Take 40 mg by mouth daily.    Yes Historical Provider, MD  gabapentin (NEURONTIN) 300 MG capsule Take 300 mg by mouth at bedtime.   Yes Historical Provider, MD  HYDROcodone-acetaminophen (NORCO/VICODIN) 5-325 MG per tablet Take 1 tablet by mouth every 6 (six) hours as needed for pain.   Yes Historical Provider, MD  lisinopril-hydrochlorothiazide (PRINZIDE,ZESTORETIC) 20-12.5 MG per tablet Take 1 tablet by mouth daily.   Yes Historical Provider, MD  Milnacipran (SAVELLA) 50 MG TABS Take 50 mg by mouth 2 (two) times daily.    Yes Historical Provider, MD  mometasone-formoterol (DULERA) 100-5 MCG/ACT AERO Inhale 2 puffs into the lungs 2 (two) times daily.   Yes Historical Provider, MD  predniSONE (DELTASONE) 10 MG tablet Take 10 mg by mouth daily.   Yes Historical Provider, MD  roflumilast (DALIRESP) 500 MCG TABS tablet Take 1 tablet (500 mcg total) by mouth daily. 12/14/11  Yes Fredirick Maudlin, MD  tadalafil (CIALIS) 5 MG tablet Take 5 mg  by mouth daily as needed for erectile dysfunction.    Yes Historical Provider, MD  tiotropium (SPIRIVA) 18 MCG inhalation capsule Place 18 mcg into inhaler and inhale daily.     Yes Historical Provider, MD   Physical Exam: Filed Vitals:   05/19/13 1200  BP: 129/87  Pulse: 105  Temp:   Resp: 25     General:  Well-nourished alert moderate respiratory distress  Eyes: PERRLA, EOMI, no scleral icterus  ENT: Ears clear nose without drainage oropharynx without erythema or exudate mucous membranes of his mouth are pink slightly dry  Neck: Supple full range of motion no indication of increased venous pressure no lymphadenopathy  Cardiovascular: Tachycardic regular no murmur no gallop no rub appreciated lower extremities without edema pedal pulses present and palpable  Respiratory: Moderate increased work of breathing with conversation on BiPAP very poor  air movement. Expiratory wheezes auscultated throughout particularly mid lobes no crackles noted  Abdomen: Flat soft positive bowel sounds nontender to palpation well-healed scar on right  Skin: Warm and dry no rash no lesions noted  Musculoskeletal: No clubbing no cyanosis joints without swelling or erythema  Psychiatric: Somewhat anxious and teary cooperative with care, verbalizes frustration at readmission  Neurologic: Alert and oriented x3 speech clear facial symmetry  Labs on Admission:  Basic Metabolic Panel:  Recent Labs Lab 05/19/13 0939  NA 140  K 4.8  CL 92*  CO2 >45*  GLUCOSE 134*  BUN 17  CREATININE 0.95  CALCIUM 9.5   Liver Function Tests: No results found for this basename: AST, ALT, ALKPHOS, BILITOT, PROT, ALBUMIN,  in the last 168 hours No results found for this basename: LIPASE, AMYLASE,  in the last 168 hours No results found for this basename: AMMONIA,  in the last 168 hours CBC:  Recent Labs Lab 05/19/13 0939  WBC 11.3*  NEUTROABS 10.0*  HGB 10.0*  HCT 38.0*  MCV 84.1  PLT 231   Cardiac Enzymes: No results found for this basename: CKTOTAL, CKMB, CKMBINDEX, TROPONINI,  in the last 168 hours  BNP (last 3 results)  Recent Labs  11/01/12 1443  PROBNP 62.2   CBG: No results found for this basename: GLUCAP,  in the last 168 hours  Radiological Exams on Admission: Dg Chest Port 1 View  05/19/2013   *RADIOLOGY REPORT*  Clinical Data: History of shortness of breath, COPD, emphysema, and smoking.  PORTABLE CHEST - 1 VIEW  Comparison: CT 11/01/2002.  04/15/2013.  Findings: Cardiac silhouette is upper limits of normal size. Ectasia and nonaneurysmal calcification of the thoracic aorta are seen.  Right hilar prominence is felt to be vascular and is unchanged.  Fibrotic changes are present in the lower lungs. Bullous emphysematous changes are present.  No pulmonary edema, consolidation, mass, or pleural effusion is evident.  There is generalized  hyperinflation.  No skeletal lesions are evident.  IMPRESSION:  Right hilar prominence is felt to be vascular and is unchanged. Fibrotic changes are present in the lower lungs.  Bullous emphysematous changes are present.  No pulmonary edema, consolidation, mass, or pleural effusion is evident.  There is generalized hyperinflation.   Original Report Authenticated By: Onalee Hua Call    EKG: Sinus tachycardia with right atrial enlargement no significant change since May 2014  Assessment/Plan Principal Problem:   Acute-on-chronic hypercarbic, hypoxic respiratory failure: Likely related to below. Will admit to step down unit and continue BiPAP. Will repeat ABG. Will continue scheduled nebulizers every 4 hours and as needed every 2 hours. Will provide  Solu-Medrol and empiric antibiotics. Active Problems:    Acute exacerbation of chronic obstructive pulmonary disease (COPD): Chest x-ray as above. Provide therapy as indicated in problem #1. Dr. Juanetta Gosling patient's primary pulmonologist and provider will see patient in the morning.    Hypertension: Stable. Systolic blood pressure range is 125-149.    Fibromyalgia: Stable at baseline    Anemia: Related to chronic disease. Chart review indicates his current level is close to baseline. No obvious signs of bleeding. Will monitor    Anxiety state, unspecified: We'll provide when necessary Ativan.   Code Status: full Family Communication: Wife at bedside Disposition Plan: Home when ready  Time spent: 75 minutes  Gwenyth Bender [facility] Pager [phone]  If 7PM-7AM, please contact night-coverage [email or web address] Password [facility] 05/19/2013, 12:51 PM Attending: Patient seen and examined. He started smoking again approximately 2 weeks ago 6-8 cigarettes a day. He has significant type II respiratory failure. He will require BiPAP, probably for today. Agree with intravenous steroids and antibiotics.

## 2013-05-19 NOTE — Progress Notes (Signed)
Rt expressed to the doctor that her wears a bipap periodically through the day and the doctor still said to take the bipap off

## 2013-05-19 NOTE — Progress Notes (Signed)
Patient requested to be in/out catheterized because he was unable to void. MD called and order received to in/out cath. 1000 mL urine returned.

## 2013-05-19 NOTE — Progress Notes (Signed)
Placed pt on bipap at 0920 and he tolerated it well. The pt's face had blue colro to it vbut his sats when he came in on bipap was 98. The doctor asked rt to take the biapap off and put him on a [place] and give him a breathing tx. Pt is still a little short of breath but looks better.

## 2013-05-19 NOTE — ED Notes (Signed)
Toya Smothers, NP at bedside at this time.

## 2013-05-19 NOTE — ED Notes (Signed)
Per EMS - 86% on RA upon their arrival.  Placed on  c-pap, now 96%.  Initiated albuterol neb tx x 2 in route.  EMS reports pt was cyanotic upon their arrival.  Color pink at this time, pt reports breathing easier on c-pap.  C/o upper back pain with hx of same.

## 2013-05-19 NOTE — Progress Notes (Signed)
CRITICAL VALUE ALERT  Critical value received:  co2 93.2  Date of notification:  05/19/2013  Time of notification:  1540  Critical value read back:yes  Nurse who received alert:  Kathyrn Sheriff, RN  MD notified (1st page):  Dr. Juanetta Gosling  Time of first page:  1548  MD notified (2nd page):  Time of second page:  Responding MD:  Dr. Juanetta Gosling  Time MD responded:  [phone]

## 2013-05-19 NOTE — ED Notes (Signed)
Pt now off bipap and on Ward at 3L/min, sats 97% RR 30.  Reports breathing easier.

## 2013-05-19 NOTE — Progress Notes (Signed)
Patient continues to report inability to urinate. Dr. Karilyn Cota notified and order received to insert foley cath. Foley inserted and 800cc clear urine returned.

## 2013-05-19 NOTE — ED Provider Notes (Addendum)
CSN: [id number]     Arrival date & time 05/19/13  7829 History  This chart was scribed for Connor Lennert, MD by Caryn Bee, ED Scribe. This patient was seen in room APA02/APA02 and the patient's care was started 9:25 AM.    Chief Complaint  Patient presents with  . Shortness of Breath   Patient is a 62 y.o. male presenting with shortness of breath. The history is provided by the patient and the EMS personnel. No language interpreter was used.  Shortness of Breath Severity:  Severe Onset quality:  Gradual Duration: chronic, worsened today. Timing:  Constant Progression:  Worsening Chronicity:  Chronic Context: smoke exposure (began smoking again 1 week ago)   Relieved by: BIPAP and multiple breathing treatments per EMS. Worsened by:  Smoke exposure Associated symptoms: wheezing    HPI Comments: Level 5 caveat Connor Armstrong is a 61 y.o. male with h/o COPD who presents to the Emergency Department complaining of SOB that exceeds his baseline SOB due to COPD. PT uses BIPAP at home as needed and 3L supplemental oxygen which normally provides relief. Pt was a former smoker of 1 pack/day for 42 years, who quit 09/20/12, but he reports that he began smoking again about 1 week ago. Per EMS, pt was cyanotic when they arrived, not moving air on the left side and his oxygen saturation was 86%.. EMS administered 2 breathing treatments and immediately put on BIPAP en route to ED with moderate relief. He reported that he and his wife both smoke.  PCP- Dr. Kari Baars   Past Medical History  Diagnosis Date  . COPD (chronic obstructive pulmonary disease)   . Fibromyalgia   . Shingles   . Cancer     kidney  . Helicobacter pylori gastritis March 2014  . Candida esophagitis March 2014  . On supplemental oxygen therapy     2-3L/min and on BPAP @ HS  . Hypertension     "he doesnt take medication anymore because no longer has high blood pressure" per family   Past Surgical History   Procedure Laterality Date  . Hernia repair    . Thyroid surgery    . Parathyroid surgery      benign tumor  . Partial nephrectomy  2005    right  . Flexible sigmoidoscopy  09/27/2012    FAO:ZHYQMVHQIONGEX/[id number]  . Esophagogastroduodenoscopy (egd) with esophageal dilation N/A 11/21/2012    SLF: DYSPHAGIA MOST LIKELY DUE TO CANDIDA ESOPHAGITIS/Small hiatal hernia/ MODERATE Non-erosive gastritis with +H.PYLORI ON PATH  . Colonoscopy N/A 01/20/2013    SLF:9 COLON POLYPS REMOVED/MILD diverticulosis in the descending colon and sigmoid colon/MODERATE INTERNAL HEMORRHOIDS  . Esophagogastroduodenoscopy (egd) with propofol N/A 01/27/2013    UUV:OZDG WHITE PLAQUES IN THE ESOPHAGUS.  BRUSH BIOPSIES OBTAINED/Non-erosive gastritis (inflammation) was found in the gastric antrum; multiple bx/  . Savory dilation N/A 01/27/2013    Procedure: SAVORY DILATION;  Surgeon: West Bali, MD;  Location: [facility];  Service: Endoscopy;  Laterality: N/A;  . Esophageal biopsy N/A 01/27/2013    Procedure: BIOPSY;  Surgeon: West Bali, MD;  Location: [facility];  Service: Endoscopy;  Laterality: N/A;  Gastric Biopsies   Family History  Problem Relation Age of Onset  . Hypertension Mother   . Colon cancer Paternal Grandmother    History  Substance Use Topics  . Smoking status: Former Smoker -- 1.00 packs/day for 42 years    Types: Cigarettes    Quit date: 09/20/2012  . Smokeless tobacco: Not  on file  . Alcohol Use: No     Comment: no ETOH in 30 years     Review of Systems  Unable to perform ROS: Severe respiratory distress  Respiratory: Positive for shortness of breath and wheezing.     Allergies  Morphine and related  Home Medications   Current Outpatient Rx  Name  Route  Sig  Dispense  Refill  . acetaminophen (TYLENOL) 500 MG tablet   Oral   Take 1,000 mg by mouth every 6 (six) hours as needed. For pain          . albuterol (PROVENTIL HFA;VENTOLIN HFA) 108 (90 BASE) MCG/ACT inhaler    Inhalation   Inhale 2 puffs into the lungs every 6 (six) hours as needed. For shortness of breath          . albuterol (PROVENTIL) (2.5 MG/3ML) 0.083% nebulizer solution   Nebulization   Take 2.5 mg by nebulization 3 (three) times daily.         . cephALEXin (KEFLEX) 500 MG capsule   Oral   Take 1 capsule (500 mg total) by mouth 3 (three) times daily.   30 capsule   0   . cyclobenzaprine (FLEXERIL) 10 MG tablet   Oral   Take 10 mg by mouth 2 (two) times daily.           . fluconazole (DIFLUCAN) 200 MG tablet      2 PO TODAY THEN 1 PO QD FOR 14 DAYS   16 tablet   0   . Fluticasone-Salmeterol (ADVAIR) 250-50 MCG/DOSE AEPB   Inhalation   Inhale 1 puff into the lungs every 12 (twelve) hours.           . furosemide (LASIX) 40 MG tablet   Oral   Take 40 mg by mouth daily as needed for fluid.         Marland Kitchen gabapentin (NEURONTIN) 300 MG capsule   Oral   Take 300 mg by mouth at bedtime.         Marland Kitchen levofloxacin (LEVAQUIN) 500 MG tablet   Oral   Take 500 mg by mouth daily.         . methylPREDNIsolone (MEDROL DOSPACK) 4 MG tablet   Oral   Take 1 tablet (4 mg total) by mouth daily. follow package directions   21 tablet   0   . Milnacipran (SAVELLA) 50 MG TABS   Oral   Take 50 mg by mouth 2 (two) times daily.          . polyethylene glycol (MIRALAX / GLYCOLAX) packet   Oral   Take 17 g by mouth daily.         . predniSONE (DELTASONE) 10 MG tablet   Oral   Take 10 mg by mouth daily.         . roflumilast (DALIRESP) 500 MCG TABS tablet   Oral   Take 1 tablet (500 mcg total) by mouth daily.   30 tablet   12   . SALINE NASAL MIST NA   Nasal   Place 1-2 sprays into the nose as needed (nasal congestion).         . tadalafil (CIALIS) 5 MG tablet   Oral   Take 5 mg by mouth daily as needed for erectile dysfunction.          Marland Kitchen tiotropium (SPIRIVA) 18 MCG inhalation capsule   Inhalation   Place 18 mcg into inhaler and inhale daily.  Triage Vitals: BP 148/72  Pulse 109  Temp(Src) 97.7 F (36.5 C) (Axillary)  Resp 26  SpO2 99%  Physical Exam  Nursing note and vitals reviewed. Constitutional: He is oriented to person, place, and time. He appears well-developed.  HENT:  Head: Normocephalic.  Eyes: Conjunctivae and EOM are normal. No scleral icterus.  Neck: Neck supple. No thyromegaly present.  Cardiovascular: Normal rate and regular rhythm.  Exam reveals no gallop and no friction rub.   No murmur heard. Pulmonary/Chest: No stridor. He has wheezes. He has no rales. He exhibits no tenderness.  Minimal wheezing bilaterally. Pt was on BIPAP via EMS in route.   Abdominal: He exhibits no distension. There is no tenderness. There is no rebound.  Musculoskeletal: Normal range of motion. He exhibits no edema.  Lymphadenopathy:    He has no cervical adenopathy.  Neurological: He is oriented to person, place, and time. Coordination normal.  Skin: No rash noted. No erythema.  Psychiatric: He has a normal mood and affect. His behavior is normal.    ED Course  Procedures (including critical care time) DIAGNOSTIC STUDIES: Oxygen Saturation is 99% on breathing mask, normal by my interpretation.   (Oxygen Saturation was initially 86% per EMS)  COORDINATION OF CARE: 9:30 AM-Ordered breathing treatment. Will also order CXR and diagnostic lab work. Discussed treatment plan with pt at bedside and pt agreed to plan.   10:05 AM- Recheck with pt and pt states that he feels "groggy". Will order additional breathing treatment. Will increase his his supplemental oxygen from 3L to 4L in the ED.   Medications  albuterol (PROVENTIL) (5 MG/ML) 0.5% nebulizer solution (  Duplicate 05/19/13 0945)  albuterol (PROVENTIL) (5 MG/ML) 0.5% nebulizer solution 5 mg (5 mg Nebulization Given 05/19/13 0938)  ipratropium (ATROVENT) nebulizer solution 0.5 mg ( Nebulization Given by Other 05/19/13 0945)  methylPREDNISolone sodium succinate (SOLU-MEDROL)  125 mg/2 mL injection 125 mg (125 mg Intravenous Given 05/19/13 0945)  albuterol (PROVENTIL) (5 MG/ML) 0.5% nebulizer solution 5 mg (5 mg Nebulization Given 05/19/13 1100)  ipratropium (ATROVENT) nebulizer solution 0.5 mg (0.5 mg Nebulization Given 05/19/13 1100)   Labs Review Labs Reviewed  CBC WITH DIFFERENTIAL - Abnormal; Notable for the following:    WBC 11.3 (*)    Hemoglobin 10.0 (*)    HCT 38.0 (*)    MCH 22.1 (*)    MCHC 26.3 (*)    RDW 19.6 (*)    Neutrophils Relative % 89 (*)    Neutro Abs 10.0 (*)    Lymphocytes Relative 5 (*)    Lymphs Abs 0.5 (*)    All other components within normal limits  BASIC METABOLIC PANEL - Abnormal; Notable for the following:    Chloride 92 (*)    CO2 >45 (*)    Glucose, Bld 134 (*)    GFR calc non Af Amer 87 (*)    All other components within normal limits  BLOOD GAS, ARTERIAL - Abnormal; Notable for the following:    pH, Arterial 7.232 (*)    pCO2 arterial 111.0 (*)    pO2, Arterial 49.8 (*)    Bicarbonate 45.1 (*)    Acid-Base Excess 17.1 (*)    All other components within normal limits   Imaging Review Dg Chest Port 1 View  05/19/2013   *RADIOLOGY REPORT*  Clinical Data: History of shortness of breath, COPD, emphysema, and smoking.  PORTABLE CHEST - 1 VIEW  Comparison: CT 11/01/2002.  04/15/2013.  Findings: Cardiac silhouette is upper limits  of normal size. Ectasia and nonaneurysmal calcification of the thoracic aorta are seen.  Right hilar prominence is felt to be vascular and is unchanged.  Fibrotic changes are present in the lower lungs. Bullous emphysematous changes are present.  No pulmonary edema, consolidation, mass, or pleural effusion is evident.  There is generalized hyperinflation.  No skeletal lesions are evident.  IMPRESSION:  Right hilar prominence is felt to be vascular and is unchanged. Fibrotic changes are present in the lower lungs.  Bullous emphysematous changes are present.  No pulmonary edema, consolidation, mass, or pleural  effusion is evident.  There is generalized hyperinflation.   Original Report Authenticated By: Onalee Hua Call  CRITICAL CARE Performed by: Shauntia Levengood,Caleel L Total critical care time: 35 Critical care time was exclusive of separately billable procedures and treating other patients. Critical care was necessary to treat or prevent imminent or life-threatening deterioration. Critical care was time spent personally by me on the following activities: development of treatment plan with patient and/or surrogate as well as nursing, discussions with consultants, evaluation of patient's response to treatment, examination of patient, obtaining history from patient or surrogate, ordering and performing treatments and interventions, ordering and review of laboratory studies, ordering and review of radiographic studies, pulse oximetry and re-evaluation of patient's condition.   Date: 05/19/2013   Date: 05/19/2013  Rate: 105  Rhythm: sinus tachycardia  QRS Axis: normal  Intervals: normal  ST/T Wave abnormalities: nonspecific ST changes  Conduction Disutrbances:none  Narrative Interpretation:   Old EKG Reviewed: unchanged        MDM  No diagnosis found.    The chart was scribed for me under my direct supervision.  I personally performed the history, physical, and medical decision making and all procedures in the evaluation of this patient.Connor Lennert, MD 05/19/13 1610  Connor Lennert, MD 05/19/13 [phone]

## 2013-05-19 NOTE — ED Notes (Signed)
sats dropped to 78% on 3L with movement during portable chest.  Encouraged deep breathing and rest.  edp at bedside and ordered increased O2 to 4L and duo neb tx.  Pt sats increased to 97% slowly.  Pt resting at this time.  Nad noted.

## 2013-05-19 NOTE — Significant Event (Signed)
Pt agitated and having difficulty tolerating BiPAP.  Will give 2 mg ativan IV x one and monitor.  Coralyn Helling, MD 05/19/2013, 10:53 PM

## 2013-05-19 NOTE — ED Notes (Signed)
Critical co2 greater than 45 called to rn, dr.zammit notified of results.

## 2013-05-19 NOTE — ED Notes (Signed)
CRITICAL VALUE ALERT  Critical value received:  PH 7.23, CO2 111, PO2 49.8, SPO2 81.7, Bicarb 45.1 on 3L [place] O2  Date of notification:  05/19/13  Time of notification:  1118  Critical value read back:yes  Nurse who received alert:  Santiago Bur, RN  MD notified (1st page):    Time of first page:    MD notified (2nd page):  Time of second page:  Responding MD:  Dr. Estell Harpin  Time MD responded:  [phone]

## 2013-05-20 LAB — BLOOD GAS, ARTERIAL
Acid-Base Excess: 19.8 mmol/L — ABNORMAL HIGH (ref 0.0–2.0)
Drawn by: 23588
O2 Content: 4 L/min
O2 Saturation: 96.8 %
Patient temperature: 37
pO2, Arterial: 80.3 mmHg (ref 80.0–100.0)

## 2013-05-20 LAB — CBC
HCT: 38.4 % — ABNORMAL LOW (ref 39.0–52.0)
Platelets: 298 10*3/uL (ref 150–400)
RBC: 4.87 MIL/uL (ref 4.22–5.81)
RDW: 19.4 % — ABNORMAL HIGH (ref 11.5–15.5)
WBC: 8.2 10*3/uL (ref 4.0–10.5)

## 2013-05-20 LAB — COMPREHENSIVE METABOLIC PANEL
Albumin: 3.2 g/dL — ABNORMAL LOW (ref 3.5–5.2)
Alkaline Phosphatase: 70 U/L (ref 39–117)
BUN: 22 mg/dL (ref 6–23)
Creatinine, Ser: 1.03 mg/dL (ref 0.50–1.35)
GFR calc Af Amer: 88 mL/min — ABNORMAL LOW (ref >90)
Glucose, Bld: 126 mg/dL — ABNORMAL HIGH (ref 70–99)
Potassium: 4 mEq/L (ref 3.5–5.1)
Total Bilirubin: 0.3 mg/dL (ref 0.3–1.2)
Total Protein: 6.7 g/dL (ref 6.0–8.3)

## 2013-05-20 MED ORDER — BUSPIRONE HCL 5 MG PO TABS
5.0000 mg | ORAL_TABLET | Freq: Three times a day (TID) | ORAL | Status: DC
  Administered 2013-05-20 – 2013-05-22 (×6): 5 mg via ORAL
  Filled 2013-05-20 (×6): qty 1

## 2013-05-20 NOTE — Progress Notes (Signed)
Subjective: He was admitted yesterday with acute on chronic respiratory failure and her PCO2 was greater than 100. He was placed on BiPAP and improved he became agitated last night the BiPAP was removed and he was given Ativan. He is still very sleepy this morning. His blood gas is much improved this morning.  Objective: Vital signs in last 24 hours: Temp:  [97.6 F (36.4 C)-99.2 F (37.3 C)] 97.6 F (36.4 C) (09/10 0400) Pulse Rate:  [51-122] 106 (09/10 0300) Resp:  [14-33] 21 (09/10 0300) BP: (99-155)/(65-113) 123/76 mmHg (09/10 0353) SpO2:  [85 %-100 %] 97 % (09/10 0730) FiO2 (%):  [35 %-40 %] 35 % (09/10 0355) Weight:  [68.5 kg (151 lb 0.2 oz)-69.3 kg (152 lb 12.5 oz)] 69.3 kg (152 lb 12.5 oz) (09/10 0500) Weight change:  Last BM Date: 05/18/13  Intake/Output from previous day: 09/09 0701 - 09/10 0700 In: 779.2 [I.V.:679.2; IV Piggyback:100] Out: 4300 [Urine:4300]  PHYSICAL EXAM General appearance: mild distress and Sleepy from Ativan. He looks pretty comfortable he will open his eyes but he does not attempt to communicate now Resp: diminished breath sounds bilaterally Cardio: His heart is regular with tachycardia. He does not have a gallop GI: soft, non-tender; bowel sounds normal; no masses,  no organomegaly Extremities: extremities normal, atraumatic, no cyanosis or edema  Lab Results:    Basic Metabolic Panel:  Recent Labs  16/10/96 0939 05/20/13 0530  NA 140 137  K 4.8 4.0  CL 92* 87*  CO2 >45* >45*  GLUCOSE 134* 126*  BUN 17 22  CREATININE 0.95 1.03  CALCIUM 9.5 9.7   Liver Function Tests:  Recent Labs  05/20/13 0530  AST 22  ALT 12  ALKPHOS 70  BILITOT 0.3  PROT 6.7  ALBUMIN 3.2*   No results found for this basename: LIPASE, AMYLASE,  in the last 72 hours No results found for this basename: AMMONIA,  in the last 72 hours CBC:  Recent Labs  05/19/13 0939 05/20/13 0530  WBC 11.3* 8.2  NEUTROABS 10.0*  --   HGB 10.0* 10.8*  HCT 38.0*  38.4*  MCV 84.1 78.9  PLT 231 298   Cardiac Enzymes: No results found for this basename: CKTOTAL, CKMB, CKMBINDEX, TROPONINI,  in the last 72 hours BNP: No results found for this basename: PROBNP,  in the last 72 hours D-Dimer: No results found for this basename: DDIMER,  in the last 72 hours CBG: No results found for this basename: GLUCAP,  in the last 72 hours Hemoglobin A1C: No results found for this basename: HGBA1C,  in the last 72 hours Fasting Lipid Panel: No results found for this basename: CHOL, HDL, LDLCALC, TRIG, CHOLHDL, LDLDIRECT,  in the last 72 hours Thyroid Function Tests: No results found for this basename: TSH, T4TOTAL, FREET4, T3FREE, THYROIDAB,  in the last 72 hours Anemia Panel: No results found for this basename: VITAMINB12, FOLATE, FERRITIN, TIBC, IRON, RETICCTPCT,  in the last 72 hours Coagulation: No results found for this basename: LABPROT, INR,  in the last 72 hours Urine Drug Screen: Drugs of Abuse  No results found for this basename: labopia, cocainscrnur, labbenz, amphetmu, thcu, labbarb    Alcohol Level: No results found for this basename: ETH,  in the last 72 hours Urinalysis: No results found for this basename: COLORURINE, APPERANCEUR, LABSPEC, PHURINE, GLUCOSEU, HGBUR, BILIRUBINUR, KETONESUR, PROTEINUR, UROBILINOGEN, NITRITE, LEUKOCYTESUR,  in the last 72 hours Misc. Labs:  ABGS  Recent Labs  05/20/13 0545  PHART 7.452*  PO2ART 80.3  TCO2 41.6  HCO3 45.4*   CULTURES Recent Results (from the past 240 hour(s))  MRSA PCR SCREENING     Status: None   Collection Time    05/19/13  1:10 PM      Result Value Range Status   MRSA by PCR NEGATIVE  NEGATIVE Final   Comment:            The GeneXpert MRSA Assay (FDA     approved for NASAL specimens     only), is one component of a     comprehensive MRSA colonization     surveillance program. It is not     intended to diagnose MRSA     infection nor to guide or     monitor treatment for      MRSA infections.   Studies/Results: Dg Chest Port 1 View  05/19/2013   *RADIOLOGY REPORT*  Clinical Data: History of shortness of breath, COPD, emphysema, and smoking.  PORTABLE CHEST - 1 VIEW  Comparison: CT 11/01/2002.  04/15/2013.  Findings: Cardiac silhouette is upper limits of normal size. Ectasia and nonaneurysmal calcification of the thoracic aorta are seen.  Right hilar prominence is felt to be vascular and is unchanged.  Fibrotic changes are present in the lower lungs. Bullous emphysematous changes are present.  No pulmonary edema, consolidation, mass, or pleural effusion is evident.  There is generalized hyperinflation.  No skeletal lesions are evident.  IMPRESSION:  Right hilar prominence is felt to be vascular and is unchanged. Fibrotic changes are present in the lower lungs.  Bullous emphysematous changes are present.  No pulmonary edema, consolidation, mass, or pleural effusion is evident.  There is generalized hyperinflation.   Original Report Authenticated By: Onalee Hua Call    Medications:  Prior to Admission:  Prescriptions prior to admission  Medication Sig Dispense Refill  . albuterol (PROVENTIL HFA;VENTOLIN HFA) 108 (90 BASE) MCG/ACT inhaler Inhale 2 puffs into the lungs every 6 (six) hours as needed. For shortness of breath       . albuterol (PROVENTIL) (2.5 MG/3ML) 0.083% nebulizer solution Take 2.5 mg by nebulization 3 (three) times daily.      . cyclobenzaprine (FLEXERIL) 10 MG tablet Take 10 mg by mouth 2 (two) times daily as needed.       Marland Kitchen Dextromethorphan-Guaifenesin (MUCINEX DM) 30-600 MG TB12 Take 1 tablet by mouth daily.      . Fluticasone Furoate-Vilanterol (BREO ELLIPTA) 100-25 MCG/INH AEPB Inhale into the lungs.      . Fluticasone-Salmeterol (ADVAIR) 250-50 MCG/DOSE AEPB Inhale 1 puff into the lungs every 12 (twelve) hours.        . furosemide (LASIX) 40 MG tablet Take 40 mg by mouth daily.       Marland Kitchen gabapentin (NEURONTIN) 300 MG capsule Take 300 mg by mouth at bedtime.       Marland Kitchen HYDROcodone-acetaminophen (NORCO/VICODIN) 5-325 MG per tablet Take 1 tablet by mouth every 6 (six) hours as needed for pain.      Marland Kitchen lisinopril-hydrochlorothiazide (PRINZIDE,ZESTORETIC) 20-12.5 MG per tablet Take 1 tablet by mouth daily.      . Milnacipran (SAVELLA) 50 MG TABS Take 50 mg by mouth 2 (two) times daily.       . mometasone-formoterol (DULERA) 100-5 MCG/ACT AERO Inhale 2 puffs into the lungs 2 (two) times daily.      . predniSONE (DELTASONE) 10 MG tablet Take 10 mg by mouth daily.      . roflumilast (DALIRESP) 500 MCG TABS tablet Take 1 tablet (500  mcg total) by mouth daily.  30 tablet  12  . tadalafil (CIALIS) 5 MG tablet Take 5 mg by mouth daily as needed for erectile dysfunction.       Marland Kitchen tiotropium (SPIRIVA) 18 MCG inhalation capsule Place 18 mcg into inhaler and inhale daily.         Scheduled: . albuterol  2.5 mg Nebulization Q4H  . antiseptic oral rinse  15 mL Mouth Rinse q12n4p  . chlorhexidine  15 mL Mouth Rinse BID  . enoxaparin (LOVENOX) injection  40 mg Subcutaneous Q24H  . furosemide  40 mg Oral Daily  . gabapentin  300 mg Oral QHS  . lisinopril  20 mg Oral Daily   And  . hydrochlorothiazide  12.5 mg Oral Daily  . ipratropium  0.5 mg Nebulization Q4H  . levofloxacin (LEVAQUIN) IV  500 mg Intravenous Q24H  . methylPREDNISolone (SOLU-MEDROL) injection  60 mg Intravenous Q6H  . Milnacipran  50 mg Oral BID  . mometasone-formoterol  2 puff Inhalation BID  . roflumilast  500 mcg Oral Daily  . senna  1 tablet Oral BID   Continuous: . sodium chloride 50 mL/hr at 05/20/13 0300   ZHY:QMVHQIONGEXBM, acetaminophen, albuterol, alum & mag hydroxide-simeth, bisacodyl, cyclobenzaprine, guaiFENesin-dextromethorphan, HYDROcodone-acetaminophen, HYDROmorphone (DILAUDID) injection, LORazepam, ondansetron (ZOFRAN) IV, ondansetron  Assesment: He has acute on chronic respiratory failure with acute exacerbation of COPD. He had acute urinary retention requiring Foley catheter. He  became very agitated which is required Ativan. His blood gases are much improved this morning. He has hypertension and chronic tachycardia. He has a history of fibromyalgia which is stable. He has chronic anxiety. He has started smoking again by history. Principal Problem:   Acute-on-chronic respiratory failure Active Problems:   Acute exacerbation of chronic obstructive pulmonary disease (COPD)   Hypertension   Fibromyalgia   Anemia   Anxiety state, unspecified    Plan: Continue current treatments. His blood gas is much improved. He appears to be resting more comfortably now.    LOS: 1 day   Blair Mesina L 05/20/2013, 8:03 AM

## 2013-05-20 NOTE — Progress Notes (Signed)
CO2 greater than 45 and PCO2 66.1 Pt already on BiPAP  MD on call paged to be made known of values. Awaiting response at this time.

## 2013-05-20 NOTE — Progress Notes (Signed)
UR Chart Review Completed  

## 2013-05-20 NOTE — Progress Notes (Signed)
Pt was stable enough to put pt on the quantum bpap with a nasal mask. The pt wears a nasal mask at home

## 2013-05-21 NOTE — Progress Notes (Signed)
Pt placed on BIPAP 18/6 with 3lpm cann bleed in hr 108 rr 24 spo2 100% pt tol well will continue to monitor through out the night

## 2013-05-21 NOTE — Progress Notes (Signed)
Subjective: He had some trouble last night with being anxious. He says he thinks he slept fairly well. His PCO2 was up this morning.  Objective: Vital signs in last 24 hours: Temp:  [97.9 F (36.6 C)-98.4 F (36.9 C)] 98 F (36.7 C) (09/11 0400) Pulse Rate:  [33-127] 120 (09/11 0800) Resp:  [17-30] 21 (09/11 0300) BP: (99-149)/(63-127) 100/63 mmHg (09/11 0800) SpO2:  [66 %-100 %] 88 % (09/11 0800) Weight:  [69 kg (152 lb 1.9 oz)] 69 kg (152 lb 1.9 oz) (09/11 0500) Weight change: 0.5 kg (1 lb 1.6 oz) Last BM Date: 05/20/13  Intake/Output from previous day: 09/10 0701 - 09/11 0700 In: 2300 [P.O.:1100; I.V.:1200] Out: 4000 [Urine:4000]  PHYSICAL EXAM General appearance: alert and mild distress Resp: rhonchi bilaterally Cardio: regular rate and rhythm, S1, S2 normal, no murmur, click, rub or gallop GI: soft, non-tender; bowel sounds normal; no masses,  no organomegaly Extremities: extremities normal, atraumatic, no cyanosis or edema  Lab Results:    Basic Metabolic Panel:  Recent Labs  16/10/96 0939 05/20/13 0530  NA 140 137  K 4.8 4.0  CL 92* 87*  CO2 >45* >45*  GLUCOSE 134* 126*  BUN 17 22  CREATININE 0.95 1.03  CALCIUM 9.5 9.7   Liver Function Tests:  Recent Labs  05/20/13 0530  AST 22  ALT 12  ALKPHOS 70  BILITOT 0.3  PROT 6.7  ALBUMIN 3.2*   No results found for this basename: LIPASE, AMYLASE,  in the last 72 hours No results found for this basename: AMMONIA,  in the last 72 hours CBC:  Recent Labs  05/19/13 0939 05/20/13 0530  WBC 11.3* 8.2  NEUTROABS 10.0*  --   HGB 10.0* 10.8*  HCT 38.0* 38.4*  MCV 84.1 78.9  PLT 231 298   Cardiac Enzymes: No results found for this basename: CKTOTAL, CKMB, CKMBINDEX, TROPONINI,  in the last 72 hours BNP: No results found for this basename: PROBNP,  in the last 72 hours D-Dimer: No results found for this basename: DDIMER,  in the last 72 hours CBG: No results found for this basename: GLUCAP,  in the  last 72 hours Hemoglobin A1C: No results found for this basename: HGBA1C,  in the last 72 hours Fasting Lipid Panel: No results found for this basename: CHOL, HDL, LDLCALC, TRIG, CHOLHDL, LDLDIRECT,  in the last 72 hours Thyroid Function Tests: No results found for this basename: TSH, T4TOTAL, FREET4, T3FREE, THYROIDAB,  in the last 72 hours Anemia Panel: No results found for this basename: VITAMINB12, FOLATE, FERRITIN, TIBC, IRON, RETICCTPCT,  in the last 72 hours Coagulation: No results found for this basename: LABPROT, INR,  in the last 72 hours Urine Drug Screen: Drugs of Abuse  No results found for this basename: labopia, cocainscrnur, labbenz, amphetmu, thcu, labbarb    Alcohol Level: No results found for this basename: ETH,  in the last 72 hours Urinalysis: No results found for this basename: COLORURINE, APPERANCEUR, LABSPEC, PHURINE, GLUCOSEU, HGBUR, BILIRUBINUR, KETONESUR, PROTEINUR, UROBILINOGEN, NITRITE, LEUKOCYTESUR,  in the last 72 hours Misc. Labs:  ABGS  Recent Labs  05/21/13 0523  PHART 7.360  PO2ART 106.0*  TCO2 PENDING  HCO3 44.5*   CULTURES Recent Results (from the past 240 hour(s))  MRSA PCR SCREENING     Status: None   Collection Time    05/19/13  1:10 PM      Result Value Range Status   MRSA by PCR NEGATIVE  NEGATIVE Final   Comment:  The GeneXpert MRSA Assay (FDA     approved for NASAL specimens     only), is one component of a     comprehensive MRSA colonization     surveillance program. It is not     intended to diagnose MRSA     infection nor to guide or     monitor treatment for     MRSA infections.   Studies/Results: Dg Chest Port 1 View  05/19/2013   *RADIOLOGY REPORT*  Clinical Data: History of shortness of breath, COPD, emphysema, and smoking.  PORTABLE CHEST - 1 VIEW  Comparison: CT 11/01/2002.  04/15/2013.  Findings: Cardiac silhouette is upper limits of normal size. Ectasia and nonaneurysmal calcification of the thoracic  aorta are seen.  Right hilar prominence is felt to be vascular and is unchanged.  Fibrotic changes are present in the lower lungs. Bullous emphysematous changes are present.  No pulmonary edema, consolidation, mass, or pleural effusion is evident.  There is generalized hyperinflation.  No skeletal lesions are evident.  IMPRESSION:  Right hilar prominence is felt to be vascular and is unchanged. Fibrotic changes are present in the lower lungs.  Bullous emphysematous changes are present.  No pulmonary edema, consolidation, mass, or pleural effusion is evident.  There is generalized hyperinflation.   Original Report Authenticated By: Onalee Hua Call    Medications:  Prior to Admission:  Prescriptions prior to admission  Medication Sig Dispense Refill  . albuterol (PROVENTIL HFA;VENTOLIN HFA) 108 (90 BASE) MCG/ACT inhaler Inhale 2 puffs into the lungs every 6 (six) hours as needed. For shortness of breath       . albuterol (PROVENTIL) (2.5 MG/3ML) 0.083% nebulizer solution Take 2.5 mg by nebulization 3 (three) times daily.      . cyclobenzaprine (FLEXERIL) 10 MG tablet Take 10 mg by mouth 2 (two) times daily as needed.       Marland Kitchen Dextromethorphan-Guaifenesin (MUCINEX DM) 30-600 MG TB12 Take 1 tablet by mouth daily.      . Fluticasone Furoate-Vilanterol (BREO ELLIPTA) 100-25 MCG/INH AEPB Inhale into the lungs.      . Fluticasone-Salmeterol (ADVAIR) 250-50 MCG/DOSE AEPB Inhale 1 puff into the lungs every 12 (twelve) hours.        . furosemide (LASIX) 40 MG tablet Take 40 mg by mouth daily.       Marland Kitchen gabapentin (NEURONTIN) 300 MG capsule Take 300 mg by mouth at bedtime.      Marland Kitchen HYDROcodone-acetaminophen (NORCO/VICODIN) 5-325 MG per tablet Take 1 tablet by mouth every 6 (six) hours as needed for pain.      Marland Kitchen lisinopril-hydrochlorothiazide (PRINZIDE,ZESTORETIC) 20-12.5 MG per tablet Take 1 tablet by mouth daily.      . Milnacipran (SAVELLA) 50 MG TABS Take 50 mg by mouth 2 (two) times daily.       .  mometasone-formoterol (DULERA) 100-5 MCG/ACT AERO Inhale 2 puffs into the lungs 2 (two) times daily.      . predniSONE (DELTASONE) 10 MG tablet Take 10 mg by mouth daily.      . roflumilast (DALIRESP) 500 MCG TABS tablet Take 1 tablet (500 mcg total) by mouth daily.  30 tablet  12  . tadalafil (CIALIS) 5 MG tablet Take 5 mg by mouth daily as needed for erectile dysfunction.       Marland Kitchen tiotropium (SPIRIVA) 18 MCG inhalation capsule Place 18 mcg into inhaler and inhale daily.         Scheduled: . albuterol  2.5 mg Nebulization Q4H  . antiseptic  oral rinse  15 mL Mouth Rinse q12n4p  . busPIRone  5 mg Oral TID  . chlorhexidine  15 mL Mouth Rinse BID  . enoxaparin (LOVENOX) injection  40 mg Subcutaneous Q24H  . furosemide  40 mg Oral Daily  . gabapentin  300 mg Oral QHS  . lisinopril  20 mg Oral Daily   And  . hydrochlorothiazide  12.5 mg Oral Daily  . ipratropium  0.5 mg Nebulization Q4H  . levofloxacin (LEVAQUIN) IV  500 mg Intravenous Q24H  . methylPREDNISolone (SOLU-MEDROL) injection  60 mg Intravenous Q6H  . Milnacipran  50 mg Oral BID  . mometasone-formoterol  2 puff Inhalation BID  . roflumilast  500 mcg Oral Daily  . senna  1 tablet Oral BID   Continuous: . sodium chloride 50 mL/hr at 05/21/13 0800   WUJ:[id number], acetaminophen, albuterol, alum & mag hydroxide-simeth, bisacodyl, cyclobenzaprine, guaiFENesin-dextromethorphan, HYDROcodone-acetaminophen, HYDROmorphone (DILAUDID) injection, LORazepam, ondansetron (ZOFRAN) IV, ondansetron  Assesment: He has acute on chronic respiratory failure. He's a little bit worse this morning. Some of that is because he is not using his BiPAP properly. He has severe COPD with acute exacerbation. He has hypertension which is doing okay. He has fibromyalgia which is worse. He has anxiety and I started him on BuSpar yesterday but that should not interfere with his breathing Principal Problem:   Acute-on-chronic respiratory failure Active  Problems:   Acute exacerbation of chronic obstructive pulmonary disease (COPD)   Hypertension   Fibromyalgia   Anemia   Anxiety state, unspecified    Plan: Continue current treatments    LOS: 2 days   Connor Armstrong 05/21/2013, 8:57 AM

## 2013-05-21 NOTE — Progress Notes (Signed)
PCO2 of 80.9.  Patient has been noncompliant with Bipap. Stressed to pt importance of leaving mask alone in order to be effective MD called. No orders at this time

## 2013-05-22 DIAGNOSIS — R338 Other retention of urine: Secondary | ICD-10-CM | POA: Diagnosis not present

## 2013-05-22 MED ORDER — LEVOFLOXACIN 500 MG PO TABS: 500.0000 mg | ORAL_TABLET | Freq: Every day | ORAL | Status: DC

## 2013-05-22 MED ORDER — PREDNISONE 10 MG PO TABS: 10.0000 mg | ORAL_TABLET | Freq: Every day | ORAL | Status: DC

## 2013-05-22 MED ORDER — BUSPIRONE HCL 5 MG PO TABS: 5.0000 mg | ORAL_TABLET | Freq: Three times a day (TID) | ORAL | Status: AC

## 2013-05-22 MED ORDER — PREDNISONE 20 MG PO TABS
60.0000 mg | ORAL_TABLET | Freq: Every day | ORAL | Status: DC
  Administered 2013-05-22: 60 mg via ORAL
  Filled 2013-05-22: qty 3

## 2013-05-22 NOTE — Discharge Summary (Signed)
Physician Discharge Summary  Patient ID: Connor Armstrong MRN: [id number] DOB/AGE: 13-Jul-1951 62 y.o. Primary Care Physician:Sargent Mankey L, MD Admit date: 05/19/2013 Discharge date: 05/22/2013    Discharge Diagnoses:   Principal Problem:   Acute-on-chronic respiratory failure Active Problems:   Acute exacerbation of chronic obstructive pulmonary disease (COPD)   Hypertension   Fibromyalgia   Anemia   Anxiety state, unspecified   Acute urinary retention     Medication List         albuterol 108 (90 BASE) MCG/ACT inhaler  Commonly known as:  PROVENTIL HFA;VENTOLIN HFA  Inhale 2 puffs into the lungs every 6 (six) hours as needed. For shortness of breath     albuterol (2.5 MG/3ML) 0.083% nebulizer solution  Commonly known as:  PROVENTIL  Take 2.5 mg by nebulization 3 (three) times daily.     BREO ELLIPTA 100-25 MCG/INH Aepb  Generic drug:  Fluticasone Furoate-Vilanterol  Inhale into the lungs.     busPIRone 5 MG tablet  Commonly known as:  BUSPAR  Take 1 tablet (5 mg total) by mouth 3 (three) times daily.     cyclobenzaprine 10 MG tablet  Commonly known as:  FLEXERIL  Take 10 mg by mouth 2 (two) times daily as needed.     Fluticasone-Salmeterol 250-50 MCG/DOSE Aepb  Commonly known as:  ADVAIR  Inhale 1 puff into the lungs every 12 (twelve) hours.     furosemide 40 MG tablet  Commonly known as:  LASIX  Take 40 mg by mouth daily.     gabapentin 300 MG capsule  Commonly known as:  NEURONTIN  Take 300 mg by mouth at bedtime.     HYDROcodone-acetaminophen 5-325 MG per tablet  Commonly known as:  NORCO/VICODIN  Take 1 tablet by mouth every 6 (six) hours as needed for pain.     levofloxacin 500 MG tablet  Commonly known as:  LEVAQUIN  Take 1 tablet (500 mg total) by mouth daily.     lisinopril-hydrochlorothiazide 20-12.5 MG per tablet  Commonly known as:  PRINZIDE,ZESTORETIC  Take 1 tablet by mouth daily.     Milnacipran 50 MG Tabs tablet  Commonly known  as:  SAVELLA  Take 50 mg by mouth 2 (two) times daily.     mometasone-formoterol 100-5 MCG/ACT Aero  Commonly known as:  DULERA  Inhale 2 puffs into the lungs 2 (two) times daily.     MUCINEX DM 30-600 MG Tb12  Take 1 tablet by mouth daily.     predniSONE 10 MG tablet  Commonly known as:  DELTASONE  Take 1 tablet (10 mg total) by mouth daily.     roflumilast 500 MCG Tabs tablet  Commonly known as:  DALIRESP  Take 1 tablet (500 mcg total) by mouth daily.     tadalafil 5 MG tablet  Commonly known as:  CIALIS  Take 5 mg by mouth daily as needed for erectile dysfunction.     tiotropium 18 MCG inhalation capsule  Commonly known as:  SPIRIVA  Place 18 mcg into inhaler and inhale daily.        Discharged Condition: Improved    Consults: None  Significant Diagnostic Studies: Dg Chest Port 1 View  05/19/2013   *RADIOLOGY REPORT*  Clinical Data: History of shortness of breath, COPD, emphysema, and smoking.  PORTABLE CHEST - 1 VIEW  Comparison: CT 11/01/2002.  04/15/2013.  Findings: Cardiac silhouette is upper limits of normal size. Ectasia and nonaneurysmal calcification of the thoracic aorta are seen.  Right  hilar prominence is felt to be vascular and is unchanged.  Fibrotic changes are present in the lower lungs. Bullous emphysematous changes are present.  No pulmonary edema, consolidation, mass, or pleural effusion is evident.  There is generalized hyperinflation.  No skeletal lesions are evident.  IMPRESSION:  Right hilar prominence is felt to be vascular and is unchanged. Fibrotic changes are present in the lower lungs.  Bullous emphysematous changes are present.  No pulmonary edema, consolidation, mass, or pleural effusion is evident.  There is generalized hyperinflation.   Original Report Authenticated By: Onalee Hua Call    Lab Results: Basic Metabolic Panel:  Recent Labs  16/10/96 0939 05/20/13 0530  NA 140 137  K 4.8 4.0  CL 92* 87*  CO2 >45* >45*  GLUCOSE 134* 126*  BUN  17 22  CREATININE 0.95 1.03  CALCIUM 9.5 9.7   Liver Function Tests:  Recent Labs  05/20/13 0530  AST 22  ALT 12  ALKPHOS 70  BILITOT 0.3  PROT 6.7  ALBUMIN 3.2*     CBC:  Recent Labs  05/19/13 0939 05/20/13 0530  WBC 11.3* 8.2  NEUTROABS 10.0*  --   HGB 10.0* 10.8*  HCT 38.0* 38.4*  MCV 84.1 78.9  PLT 231 298    Recent Results (from the past 240 hour(s))  MRSA PCR SCREENING     Status: None   Collection Time    05/19/13  1:10 PM      Result Value Range Status   MRSA by PCR NEGATIVE  NEGATIVE Final   Comment:            The GeneXpert MRSA Assay (FDA     approved for NASAL specimens     only), is one component of a     comprehensive MRSA colonization     surveillance program. It is not     intended to diagnose MRSA     infection nor to guide or     monitor treatment for     MRSA infections.     Hospital Course: He was admitted with acute on chronic respiratory failure with PCO2 of greater than 100. It should be noted that he had become more anxious and started smoking again before this admission. He was started on IV steroids inhaled bronchodilators and IV antibiotics. He was given BiPAP and did not require intubation and mechanical ventilation. He improved over the next several days he did have trouble with acute urinary retention and required Foley catheter and with anxiety. He was started on BuSpar to see if it would help with his anxiety because that should not affect his respiratory status very much. At the time of discharge he was back to baseline able to ambulate in the unit with no new symptoms  Discharge Exam: Blood pressure 135/107, pulse 102, temperature 98 F (36.7 C), temperature source Oral, resp. rate 15, height 5\' 11"  (1.803 m), weight 68.4 kg (150 lb 12.7 oz), SpO2 94.00%. He is chronically sick in appearance. His chest shows decreased breath sounds. His heart is regular with a mild tachycardia  Disposition: Home with home health services. I am  going to make a referral for him to see if he is a candidate for lung transplantation      Discharge Orders   Future Orders Complete By Expires   Discharge patient  As directed    Face-to-face encounter (required for Medicare/Medicaid patients)  As directed    Comments:     I Rheya Minogue L certify that  this patient is under my care and that I, or a nurse practitioner or physician's assistant working with me, had a face-to-face encounter that meets the physician face-to-face encounter requirements with this patient on 05/22/2013. The encounter with the patient was in whole, or in part for the following medical condition(s) which is the primary reason for home health care (List medical condition): Acute on chronic respiratory failure   Questions:     The encounter with the patient was in whole, or in part, for the following medical condition, which is the primary reason for home health care:  Acute on chronic respiratory failure   I certify that, based on my findings, the following services are medically necessary home health services:  Nursing   Physical therapy   My clinical findings support the need for the above services:  Shortness of breath with activity   Further, I certify that my clinical findings support that this patient is homebound due to:  Shortness of Breath with activity   Reason for Medically Necessary Home Health Services:  Skilled Nursing- Change/Decline in Patient Status   Home Health  As directed    Questions:     To provide the following care/treatments:  PT   RN        Signed: Fredirick Maudlin Pager [phone]  05/22/2013, 8:40 AM

## 2013-05-22 NOTE — Addendum Note (Signed)
Encounter addended by: Angelica Pou, RN on: 05/22/2013  7:48 AM<BR>     Documentation filed: Notes Section

## 2013-05-22 NOTE — Progress Notes (Signed)
Subjective: He says he feels well and wants to go home. He was able to ambulate in the unit yesterday and did well with that. He slept better last night. He has no new complaints.  Objective: Vital signs in last 24 hours: Temp:  [97 F (36.1 C)-98.5 F (36.9 C)] 98 F (36.7 C) (09/12 0730) Pulse Rate:  [75-133] 102 (09/12 0700) Resp:  [10-27] 15 (09/12 0700) BP: (82-136)/(50-107) 135/107 mmHg (09/12 0700) SpO2:  [83 %-100 %] 94 % (09/12 0700) Weight:  [68.4 kg (150 lb 12.7 oz)] 68.4 kg (150 lb 12.7 oz) (09/12 0500) Weight change: -0.6 kg (-1 lb 5.2 oz) Last BM Date: 05/21/13  Intake/Output from previous day: 09/11 0701 - 09/12 0700 In: 2950 [P.O.:1550; I.V.:1200; IV Piggyback:200] Out: 3350 [Urine:3350]  PHYSICAL EXAM General appearance: alert, cooperative and no distress Resp: clear to auscultation bilaterally Cardio: regular rate and rhythm, S1, S2 normal, no murmur, click, rub or gallop GI: soft, non-tender; bowel sounds normal; no masses,  no organomegaly Extremities: extremities normal, atraumatic, no cyanosis or edema  Lab Results:    Basic Metabolic Panel:  Recent Labs  16/10/96 0939 05/20/13 0530  NA 140 137  K 4.8 4.0  CL 92* 87*  CO2 >45* >45*  GLUCOSE 134* 126*  BUN 17 22  CREATININE 0.95 1.03  CALCIUM 9.5 9.7   Liver Function Tests:  Recent Labs  05/20/13 0530  AST 22  ALT 12  ALKPHOS 70  BILITOT 0.3  PROT 6.7  ALBUMIN 3.2*   No results found for this basename: LIPASE, AMYLASE,  in the last 72 hours No results found for this basename: AMMONIA,  in the last 72 hours CBC:  Recent Labs  05/19/13 0939 05/20/13 0530  WBC 11.3* 8.2  NEUTROABS 10.0*  --   HGB 10.0* 10.8*  HCT 38.0* 38.4*  MCV 84.1 78.9  PLT 231 298   Cardiac Enzymes: No results found for this basename: CKTOTAL, CKMB, CKMBINDEX, TROPONINI,  in the last 72 hours BNP: No results found for this basename: PROBNP,  in the last 72 hours D-Dimer: No results found for this  basename: DDIMER,  in the last 72 hours CBG: No results found for this basename: GLUCAP,  in the last 72 hours Hemoglobin A1C: No results found for this basename: HGBA1C,  in the last 72 hours Fasting Lipid Panel: No results found for this basename: CHOL, HDL, LDLCALC, TRIG, CHOLHDL, LDLDIRECT,  in the last 72 hours Thyroid Function Tests: No results found for this basename: TSH, T4TOTAL, FREET4, T3FREE, THYROIDAB,  in the last 72 hours Anemia Panel: No results found for this basename: VITAMINB12, FOLATE, FERRITIN, TIBC, IRON, RETICCTPCT,  in the last 72 hours Coagulation: No results found for this basename: LABPROT, INR,  in the last 72 hours Urine Drug Screen: Drugs of Abuse  No results found for this basename: labopia, cocainscrnur, labbenz, amphetmu, thcu, labbarb    Alcohol Level: No results found for this basename: ETH,  in the last 72 hours Urinalysis: No results found for this basename: COLORURINE, APPERANCEUR, LABSPEC, PHURINE, GLUCOSEU, HGBUR, BILIRUBINUR, KETONESUR, PROTEINUR, UROBILINOGEN, NITRITE, LEUKOCYTESUR,  in the last 72 hours Misc. Labs:  ABGS  Recent Labs  05/21/13 0523  PHART 7.360  PO2ART 106.0*  TCO2 PENDING  HCO3 44.5*   CULTURES Recent Results (from the past 240 hour(s))  MRSA PCR SCREENING     Status: None   Collection Time    05/19/13  1:10 PM      Result Value Range  Status   MRSA by PCR NEGATIVE  NEGATIVE Final   Comment:            The GeneXpert MRSA Assay (FDA     approved for NASAL specimens     only), is one component of a     comprehensive MRSA colonization     surveillance program. It is not     intended to diagnose MRSA     infection nor to guide or     monitor treatment for     MRSA infections.   Studies/Results: No results found.  Medications:  Prior to Admission:  Prescriptions prior to admission  Medication Sig Dispense Refill  . albuterol (PROVENTIL HFA;VENTOLIN HFA) 108 (90 BASE) MCG/ACT inhaler Inhale 2 puffs into  the lungs every 6 (six) hours as needed. For shortness of breath       . albuterol (PROVENTIL) (2.5 MG/3ML) 0.083% nebulizer solution Take 2.5 mg by nebulization 3 (three) times daily.      . cyclobenzaprine (FLEXERIL) 10 MG tablet Take 10 mg by mouth 2 (two) times daily as needed.       Marland Kitchen Dextromethorphan-Guaifenesin (MUCINEX DM) 30-600 MG TB12 Take 1 tablet by mouth daily.      . Fluticasone Furoate-Vilanterol (BREO ELLIPTA) 100-25 MCG/INH AEPB Inhale into the lungs.      . Fluticasone-Salmeterol (ADVAIR) 250-50 MCG/DOSE AEPB Inhale 1 puff into the lungs every 12 (twelve) hours.        . furosemide (LASIX) 40 MG tablet Take 40 mg by mouth daily.       Marland Kitchen gabapentin (NEURONTIN) 300 MG capsule Take 300 mg by mouth at bedtime.      Marland Kitchen HYDROcodone-acetaminophen (NORCO/VICODIN) 5-325 MG per tablet Take 1 tablet by mouth every 6 (six) hours as needed for pain.      Marland Kitchen lisinopril-hydrochlorothiazide (PRINZIDE,ZESTORETIC) 20-12.5 MG per tablet Take 1 tablet by mouth daily.      . Milnacipran (SAVELLA) 50 MG TABS Take 50 mg by mouth 2 (two) times daily.       . mometasone-formoterol (DULERA) 100-5 MCG/ACT AERO Inhale 2 puffs into the lungs 2 (two) times daily.      . predniSONE (DELTASONE) 10 MG tablet Take 10 mg by mouth daily.      . roflumilast (DALIRESP) 500 MCG TABS tablet Take 1 tablet (500 mcg total) by mouth daily.  30 tablet  12  . tadalafil (CIALIS) 5 MG tablet Take 5 mg by mouth daily as needed for erectile dysfunction.       Marland Kitchen tiotropium (SPIRIVA) 18 MCG inhalation capsule Place 18 mcg into inhaler and inhale daily.         Scheduled: . albuterol  2.5 mg Nebulization Q4H  . busPIRone  5 mg Oral TID  . enoxaparin (LOVENOX) injection  40 mg Subcutaneous Q24H  . furosemide  40 mg Oral Daily  . gabapentin  300 mg Oral QHS  . lisinopril  20 mg Oral Daily   And  . hydrochlorothiazide  12.5 mg Oral Daily  . ipratropium  0.5 mg Nebulization Q4H  . levofloxacin (LEVAQUIN) IV  500 mg Intravenous  Q24H  . methylPREDNISolone (SOLU-MEDROL) injection  60 mg Intravenous Q6H  . Milnacipran  50 mg Oral BID  . mometasone-formoterol  2 puff Inhalation BID  . roflumilast  500 mcg Oral Daily  . senna  1 tablet Oral BID   Continuous: . sodium chloride 50 mL/hr at 05/21/13 1800   ZOX:[id number], acetaminophen, albuterol, alum & mag hydroxide-simeth,  bisacodyl, cyclobenzaprine, guaiFENesin-dextromethorphan, HYDROcodone-acetaminophen, HYDROmorphone (DILAUDID) injection, LORazepam, ondansetron (ZOFRAN) IV, ondansetron  Assesment: He has acute on chronic respiratory failure. This seems to have been made worse by significant anxiety. He has acute exacerbation of COPD. He has hypertension which is fairly well controlled although his blood pressure is up this morning. He has severe fibromyalgia. He is mildly anemic and has had GI bleeding. He had acute urinary retention but his Foley catheter is out. I think he probably is at maximum hospital benefit but he is generally very tenuous and I expect he will have to come back to the hospital due to his severe COPD. Principal Problem:   Acute-on-chronic respiratory failure Active Problems:   Acute exacerbation of chronic obstructive pulmonary disease (COPD)   Hypertension   Fibromyalgia   Anemia   Anxiety state, unspecified    Plan: Discharge home. I'm going to see if he can get an appointment with the physicians at [facility] to consider lung transplant    LOS: 3 days   Kayler Rise L 05/22/2013, 8:31 AM

## 2013-05-22 NOTE — Progress Notes (Signed)
Pulmonary Rehabilitation Program Outcomes Report   Orientation:  01/08/2013 Halfway Report: 04/06/2013 Graduate Date:  tbd Discharge Date:  tbd # of sessions completed: 18 DX: COPD  Pulmonologist: Dr Kari Baars Family MD:  Dr. Kari Baars Class Time:  13:00  A.  Exercise Program:  Tolerates exercise @ 3.73 METS for 15 minutes  B.  Mental Health:  Good mental attitude  C.  Education/Instruction/Skills  Accurately checks own pulse.  Rest:  111  Exercise 119, Knows THR for exercise and Uses Perceived Exertion Scale and/or Dyspnea Scale  Demonstrates accurate pursed lip breathing  D.  Nutrition/Weight Control/Body Composition:  Adherence to prescribed nutrition program: good    E.  Blood Lipids    No results found for this basename: CHOL, HDL, LDLCALC, LDLDIRECT, TRIG, CHOLHDL    F.  Lifestyle Changes:  Making positive lifestyle changes  G.  Symptoms noted with exercise:  Asymptomatic  Report Completed By:  [name]. Hillarie Harrigan RN   Comments:  This is patients halfway report. He achieved a peak METS of 3.73. His resting HR was 111 and resting BP was 132/68, and peak HR was 119 and peak BP was 158/68. A report will follow upon his 24 th visit.

## 2013-05-22 NOTE — Addendum Note (Signed)
Encounter addended by: Angelica Pou, RN on: 05/22/2013  7:50 AM<BR>     Documentation filed: Clinical Notes

## 2013-05-22 NOTE — Progress Notes (Signed)
Pulmonary Rehabilitation Program Outcomes Report   Orientation:  01/08/2013 Graduate Date:  Na Discharge Date:  04/22/2013  # of sessions completed: 21 [id number]  Pulmonologist: Dr. Kari Baars Family MD:  Dr. Kari Baars Class Time:  13:00  A.  Exercise Program:  Tolerates exercise @ 3.73 METS for 15 minutes and Discharged  B.  Mental Health:  Good mental attitude  C.  Education/Instruction/Skills  Accurately checks own pulse.  Rest:  116  Exercise:  117, Knows THR for exercise, Uses Perceived Exertion Scale and/or Dyspnea Scale and Attended 10 education classes  Demonstrates accurate pursed lip breathing  D.  Nutrition/Weight Control/Body Composition:  Adherence to prescribed nutrition program: good    E.  Blood Lipids    No results found for this basename: CHOL, HDL, LDLCALC, LDLDIRECT, TRIG, CHOLHDL    F.  Lifestyle Changes:  Making positive lifestyle changes  G.  Symptoms noted with exercise:  Asymptomatic  Report Completed By:  [name]. [name] RN   Comments:  This is patients last visit. He attended 21 sessions of 24, Got sick and did not return, He did very well while here. His resting HR was 116 and resting BP was 132/70, His peak HR was 117 and peak BP was 150/60.This is patients last report.

## 2013-05-22 NOTE — Progress Notes (Signed)
Pulmonary Rehabilitation Program Outcomes Report   Orientation:  01/08/2013 !st week report: 01/21/2013 Graduate Date:  N/A Discharge Date:  tbd # of sessions completed: 3  Pulmonologist: Dr. Juanetta Gosling Family MD:  Dr. Juanetta Gosling Class Time:  13:00  A.  Exercise Program:  Tolerates exercise @ 3.73 METS for 15 minutes and Walk Test Results:  Pre: Pre Walk test results: Pre HR 89, BP 140/82, O2 96%, RPE 9, and RPD 9, ^minuteHR 105, BP 134/72,O2 96%, RPE 13 and RPD13, Post HR 82, BP 140/80, O2 99%, RPE9, and RPD9.walked 600 ft at 1.1 mph at 1.8 METS.  B.  Mental Health:  Good mental attitude  C.  Education/Instruction/Skills  Accurately checks own pulse.  Rest:  105  Exercise:  120, Knows THR for exercise and Uses Perceived Exertion Scale and/or Dyspnea Scale  Demonstrates accurate diaphragmatic breathing  D.  Nutrition/Weight Control/Body Composition:  Adherence to prescribed nutrition program: good    E.  Blood Lipids    No results found for this basename: CHOL, HDL, LDLCALC, LDLDIRECT, TRIG, CHOLHDL    F.  Lifestyle Changes:  Making positive lifestyle changes  G.  Symptoms noted with exercise:  Asymptomatic  Report Completed By:  Connor Armstrong. Connor Balaguer RN   Comments:  Patient has completed his first week in cardiopulmonary rehab. He has done well, achieved a peak mets of 3.73. His resting HR was 105 and Resting BP was 130/62, His peak HR was 120 and his peak BP was 162/78. A report will follow upon his 18th visit his halfway point.

## 2013-05-22 NOTE — Progress Notes (Signed)
Patient discharged home.  HH in place with RN and PT, patient already has all home equipment needed.  Appt already in place with PCP.  IV removed - WNL.  Instructed on new medications and how to take them, as well instructed to continue using Bipap at night.  Pt verbalizes understanding.  Care notes given.  Patient stable for DC at this time - waiting on wife to arrive.

## 2013-05-22 NOTE — Care Management Note (Signed)
    Page 1 of 1   05/22/2013     12:12:39 PM   CARE MANAGEMENT NOTE 05/22/2013  Patient:  Connor Armstrong, Connor Armstrong   Account Number:  [id number]  Date Initiated:  05/22/2013  Documentation initiated by:  Anibal Henderson  Subjective/Objective Assessment:   Admitted with resp failure/COPD. Pt lives at home, with spouse, and will return home at D/C. He states he uses [facility] for [facility] if needed     Action/Plan:   [facility] ste up to follow for CP monitoring and PT. He has O2 at home   Anticipated DC Date:  05/22/2013   Anticipated DC Plan:  HOME W HOME HEALTH SERVICES      DC Planning Services  CM consult      [facility]  HOME HEALTH   Choice offered to / List presented to:  C-1 Patient        HH arranged  HH-1 RN  HH-10 DISEASE MANAGEMENT  HH-2 PT      HH agency  [facility]   Status of service:  Completed, signed off Medicare Important Message given?  YES (If response is "NO", the following Medicare IM given date fields will be blank) Date Medicare IM given:  05/22/2013 Date Additional Medicare IM given:    Discharge Disposition:  HOME W HOME HEALTH SERVICES  Per UR Regulation:  Reviewed for med. necessity/level of care/duration of stay  If discussed at Long Length of Stay Meetings, dates discussed:    Comments:  05/22/13 0900 Anibal Henderson RN/CM

## 2013-06-30 ENCOUNTER — Emergency Department (HOSPITAL_COMMUNITY): Payer: Medicare Other

## 2013-06-30 ENCOUNTER — Inpatient Hospital Stay (HOSPITAL_COMMUNITY)
Admission: EM | Admit: 2013-06-30 | Discharge: 2013-07-04 | DRG: 193 | Disposition: A | Discharge status: Home Health | Payer: Medicare Other | Attending: Pulmonary Disease | Admitting: Pulmonary Disease

## 2013-06-30 ENCOUNTER — Encounter (HOSPITAL_COMMUNITY): Payer: Self-pay | Admitting: Emergency Medicine

## 2013-06-30 DIAGNOSIS — J449 Chronic obstructive pulmonary disease, unspecified: Secondary | ICD-10-CM

## 2013-06-30 DIAGNOSIS — Z79899 Other long term (current) drug therapy: Secondary | ICD-10-CM

## 2013-06-30 DIAGNOSIS — J9620 Acute and chronic respiratory failure, unspecified whether with hypoxia or hypercapnia: Secondary | ICD-10-CM

## 2013-06-30 DIAGNOSIS — E872 Acidosis, unspecified: Secondary | ICD-10-CM | POA: Diagnosis present

## 2013-06-30 DIAGNOSIS — Z87891 Personal history of nicotine dependence: Secondary | ICD-10-CM

## 2013-06-30 DIAGNOSIS — Z905 Acquired absence of kidney: Secondary | ICD-10-CM

## 2013-06-30 DIAGNOSIS — Z8249 Family history of ischemic heart disease and other diseases of the circulatory system: Secondary | ICD-10-CM

## 2013-06-30 DIAGNOSIS — J441 Chronic obstructive pulmonary disease with (acute) exacerbation: Secondary | ICD-10-CM

## 2013-06-30 DIAGNOSIS — IMO0001 Reserved for inherently not codable concepts without codable children: Secondary | ICD-10-CM | POA: Diagnosis present

## 2013-06-30 DIAGNOSIS — Z800 Family history of malignant neoplasm of digestive organs: Secondary | ICD-10-CM

## 2013-06-30 DIAGNOSIS — J189 Pneumonia, unspecified organism: Principal | ICD-10-CM

## 2013-06-30 DIAGNOSIS — Z9981 Dependence on supplemental oxygen: Secondary | ICD-10-CM

## 2013-06-30 DIAGNOSIS — I10 Essential (primary) hypertension: Secondary | ICD-10-CM | POA: Diagnosis present

## 2013-06-30 LAB — CBC WITH DIFFERENTIAL/PLATELET
Basophils Absolute: 0 10*3/uL (ref 0.0–0.1)
Basophils Relative: 0 % (ref 0–1)
Eosinophils Absolute: 0.2 10*3/uL (ref 0.0–0.7)
Eosinophils Relative: 1 % (ref 0–5)
Lymphs Abs: 0.6 10*3/uL — ABNORMAL LOW (ref 0.7–4.0)
MCH: 23.9 pg — ABNORMAL LOW (ref 26.0–34.0)
MCV: 83.1 fL (ref 78.0–100.0)
Neutro Abs: 16.3 10*3/uL — ABNORMAL HIGH (ref 1.7–7.7)
Neutrophils Relative %: 92 % — ABNORMAL HIGH (ref 43–77)
Platelets: 296 10*3/uL (ref 150–400)
RBC: 4.72 MIL/uL (ref 4.22–5.81)
RDW: 19.4 % — ABNORMAL HIGH (ref 11.5–15.5)
WBC: 17.6 10*3/uL — ABNORMAL HIGH (ref 4.0–10.5)

## 2013-06-30 LAB — BLOOD GAS, ARTERIAL
Acid-Base Excess: 14.3 mmol/L — ABNORMAL HIGH (ref 0.0–2.0)
Bicarbonate: 40.7 mEq/L — ABNORMAL HIGH (ref 20.0–24.0)
Drawn by: 25788
O2 Content: 3 L/min
O2 Saturation: 98.4 %
pCO2 arterial: 79.2 mmHg (ref 35.0–45.0)
pH, Arterial: 7.331 — ABNORMAL LOW (ref 7.350–7.450)
pO2, Arterial: 122 mmHg — ABNORMAL HIGH (ref 80.0–100.0)

## 2013-06-30 LAB — TROPONIN I: Troponin I: <0.3 ng/mL (ref <0.30)

## 2013-06-30 LAB — BASIC METABOLIC PANEL
Calcium: 9.5 mg/dL (ref 8.4–10.5)
Chloride: 91 mEq/L — ABNORMAL LOW (ref 96–112)
GFR calc Af Amer: 82 mL/min — ABNORMAL LOW (ref >90)
GFR calc non Af Amer: 71 mL/min — ABNORMAL LOW (ref >90)
Glucose, Bld: 124 mg/dL — ABNORMAL HIGH (ref 70–99)
Potassium: 4 mEq/L (ref 3.5–5.1)
Sodium: 138 mEq/L (ref 135–145)

## 2013-06-30 MED ORDER — METHYLPREDNISOLONE SODIUM SUCC 125 MG IJ SOLR
125.0000 mg | Freq: Once | INTRAMUSCULAR | Status: AC
  Administered 2013-06-30: 125 mg via INTRAVENOUS
  Filled 2013-06-30: qty 2

## 2013-06-30 MED ORDER — METHYLPREDNISOLONE SODIUM SUCC 40 MG IJ SOLR: 40.0000 mg | Freq: Four times a day (QID) | INTRAMUSCULAR | Status: DC

## 2013-06-30 MED ORDER — DEXTROSE 5 % IV SOLN: 1.0000 g | Freq: Once | INTRAVENOUS | Status: DC

## 2013-06-30 MED ORDER — ALBUTEROL SULFATE (5 MG/ML) 0.5% IN NEBU
2.5000 mg | INHALATION_SOLUTION | Freq: Four times a day (QID) | RESPIRATORY_TRACT | Status: DC
  Administered 2013-06-30 – 2013-07-04 (×16): 2.5 mg via RESPIRATORY_TRACT
  Filled 2013-06-30 (×14): qty 0.5

## 2013-06-30 MED ORDER — SODIUM CHLORIDE 0.9 % IJ SOLN
3.0000 mL | Freq: Two times a day (BID) | INTRAMUSCULAR | Status: DC
  Administered 2013-06-30 – 2013-07-03 (×6): 3 mL via INTRAVENOUS

## 2013-06-30 MED ORDER — DEXTROSE 5 % IV SOLN
500.0000 mg | INTRAVENOUS | Status: DC
  Administered 2013-06-30 – 2013-07-01 (×2): 500 mg via INTRAVENOUS
  Filled 2013-06-30 (×2): qty 500

## 2013-06-30 MED ORDER — MILNACIPRAN HCL 50 MG PO TABS
50.0000 mg | ORAL_TABLET | Freq: Two times a day (BID) | ORAL | Status: DC
  Administered 2013-06-30 – 2013-07-04 (×9): 50 mg via ORAL
  Filled 2013-06-30 (×13): qty 1

## 2013-06-30 MED ORDER — ONDANSETRON HCL 4 MG/2ML IJ SOLN: 4.0000 mg | Freq: Four times a day (QID) | INTRAMUSCULAR | Status: DC | PRN

## 2013-06-30 MED ORDER — METHYLPREDNISOLONE SODIUM SUCC 40 MG IJ SOLR
40.0000 mg | Freq: Four times a day (QID) | INTRAMUSCULAR | Status: DC
  Administered 2013-06-30 – 2013-07-04 (×15): 40 mg via INTRAVENOUS
  Filled 2013-06-30 (×18): qty 1

## 2013-06-30 MED ORDER — ACETAMINOPHEN 650 MG RE SUPP: 650.0000 mg | Freq: Four times a day (QID) | RECTAL | Status: DC | PRN

## 2013-06-30 MED ORDER — ALBUTEROL SULFATE (5 MG/ML) 0.5% IN NEBU: 2.5000 mg | INHALATION_SOLUTION | RESPIRATORY_TRACT | Status: DC | PRN

## 2013-06-30 MED ORDER — GABAPENTIN 300 MG PO CAPS
300.0000 mg | ORAL_CAPSULE | Freq: Every day | ORAL | Status: DC
  Administered 2013-06-30 – 2013-07-03 (×4): 300 mg via ORAL
  Filled 2013-06-30 (×4): qty 1

## 2013-06-30 MED ORDER — IPRATROPIUM BROMIDE 0.02 % IN SOLN
0.5000 mg | Freq: Four times a day (QID) | RESPIRATORY_TRACT | Status: DC
  Administered 2013-06-30 – 2013-07-04 (×16): 0.5 mg via RESPIRATORY_TRACT
  Filled 2013-06-30 (×14): qty 2.5

## 2013-06-30 MED ORDER — IPRATROPIUM BROMIDE 0.02 % IN SOLN
0.5000 mg | RESPIRATORY_TRACT | Status: DC
  Administered 2013-06-30: 0.5 mg via RESPIRATORY_TRACT
  Filled 2013-06-30: qty 2.5

## 2013-06-30 MED ORDER — BUSPIRONE HCL 5 MG PO TABS
5.0000 mg | ORAL_TABLET | Freq: Three times a day (TID) | ORAL | Status: DC
  Administered 2013-06-30 – 2013-07-04 (×12): 5 mg via ORAL
  Filled 2013-06-30 (×12): qty 1

## 2013-06-30 MED ORDER — SODIUM CHLORIDE 0.9 % IV SOLN
250.0000 mL | INTRAVENOUS | Status: DC | PRN
  Administered 2013-06-30: 250 mL via INTRAVENOUS

## 2013-06-30 MED ORDER — BIOTENE DRY MOUTH MT LIQD
15.0000 mL | Freq: Two times a day (BID) | OROMUCOSAL | Status: DC
  Administered 2013-06-30 – 2013-07-04 (×8): 15 mL via OROMUCOSAL

## 2013-06-30 MED ORDER — ACETAMINOPHEN 325 MG PO TABS: 650.0000 mg | ORAL_TABLET | Freq: Four times a day (QID) | ORAL | Status: DC | PRN

## 2013-06-30 MED ORDER — METHYLPREDNISOLONE SODIUM SUCC 125 MG IJ SOLR
INTRAMUSCULAR | Status: AC
  Administered 2013-06-30: 40 mg via INTRAVENOUS
  Filled 2013-06-30: qty 2

## 2013-06-30 MED ORDER — CYCLOBENZAPRINE HCL 10 MG PO TABS
10.0000 mg | ORAL_TABLET | Freq: Two times a day (BID) | ORAL | Status: DC
  Administered 2013-06-30 – 2013-07-04 (×9): 10 mg via ORAL
  Filled 2013-06-30 (×10): qty 1

## 2013-06-30 MED ORDER — AZITHROMYCIN 250 MG PO TABS: 500.0000 mg | ORAL_TABLET | Freq: Once | ORAL | Status: DC

## 2013-06-30 MED ORDER — IPRATROPIUM BROMIDE 0.02 % IN SOLN
RESPIRATORY_TRACT | Status: AC
  Administered 2013-06-30: 0.5 mg
  Filled 2013-06-30: qty 2.5

## 2013-06-30 MED ORDER — ALBUTEROL SULFATE (5 MG/ML) 0.5% IN NEBU
5.0000 mg | INHALATION_SOLUTION | RESPIRATORY_TRACT | Status: DC
  Administered 2013-06-30: 5 mg via RESPIRATORY_TRACT
  Filled 2013-06-30: qty 1

## 2013-06-30 MED ORDER — SODIUM CHLORIDE 0.9 % IJ SOLN
3.0000 mL | Freq: Two times a day (BID) | INTRAMUSCULAR | Status: DC
  Administered 2013-07-02 – 2013-07-03 (×2): 3 mL via INTRAVENOUS

## 2013-06-30 MED ORDER — SODIUM CHLORIDE 0.9 % IV BOLUS (SEPSIS)
1000.0000 mL | Freq: Once | INTRAVENOUS | Status: AC
  Administered 2013-06-30: 1000 mL via INTRAVENOUS

## 2013-06-30 MED ORDER — LEVOFLOXACIN IN D5W 750 MG/150ML IV SOLN
750.0000 mg | Freq: Once | INTRAVENOUS | Status: AC
  Administered 2013-06-30: 750 mg via INTRAVENOUS
  Filled 2013-06-30: qty 150

## 2013-06-30 MED ORDER — ONDANSETRON HCL 4 MG PO TABS: 4.0000 mg | ORAL_TABLET | Freq: Four times a day (QID) | ORAL | Status: DC | PRN

## 2013-06-30 MED ORDER — HYDROCODONE-ACETAMINOPHEN 5-325 MG PO TABS
1.0000 | ORAL_TABLET | Freq: Four times a day (QID) | ORAL | Status: DC | PRN
  Administered 2013-06-30 – 2013-07-04 (×7): 1 via ORAL
  Filled 2013-06-30 (×7): qty 1

## 2013-06-30 MED ORDER — SODIUM CHLORIDE 0.9 % IJ SOLN: 3.0000 mL | INTRAMUSCULAR | Status: DC | PRN

## 2013-06-30 MED ORDER — CHLORHEXIDINE GLUCONATE 0.12 % MT SOLN
15.0000 mL | Freq: Two times a day (BID) | OROMUCOSAL | Status: DC
  Administered 2013-06-30 – 2013-07-04 (×7): 15 mL via OROMUCOSAL
  Filled 2013-06-30 (×7): qty 15

## 2013-06-30 MED ORDER — DEXTROSE 5 % IV SOLN
1.0000 g | INTRAVENOUS | Status: DC
  Administered 2013-06-30 – 2013-07-01 (×2): 1 g via INTRAVENOUS
  Filled 2013-06-30 (×2): qty 10

## 2013-06-30 MED ORDER — ENOXAPARIN SODIUM 40 MG/0.4ML ~~LOC~~ SOLN
40.0000 mg | SUBCUTANEOUS | Status: DC
  Administered 2013-06-30 – 2013-07-03 (×4): 40 mg via SUBCUTANEOUS
  Filled 2013-06-30 (×4): qty 0.4

## 2013-06-30 MED ORDER — ALBUTEROL SULFATE (5 MG/ML) 0.5% IN NEBU
INHALATION_SOLUTION | RESPIRATORY_TRACT | Status: AC
  Filled 2013-06-30: qty 0.5

## 2013-06-30 NOTE — ED Provider Notes (Signed)
TIME SEEN: 11:33  CHIEF COMPLAINT: Fever, SOB  HPI: Pt is 62 y.o. with h/o COPD that complains of moderate fever onset last night of 100. Pt states that this morning the fever returned and was 102. He reports that he has had mild shortness of breath despite his 3 L of oxygen at baseline and using CPAP at night. He denies that he's had any neck pain or neck stiffness, chest pain, abdominal pain, vomiting or diarrhea, skin rash. No tick bites. Pt denies sick contacts or international travel.   ROS: See HPI Constitutional: fever, headache  Eyes: no drainage  ENT: no runny nose   Cardiovascular:  no chest pain  Resp: SOB, no cough  GI: no vomiting, no nausea, no diarrhea GU: no dysuria Integumentary: no rash, no insect bites  Allergy: no hives  Musculoskeletal: no leg swelling  Neurological: no slurred speech ROS otherwise negative  PAST MEDICAL HISTORY/PAST SURGICAL HISTORY:  Past Medical History  Diagnosis Date  . COPD (chronic obstructive pulmonary disease)   . Fibromyalgia   . Shingles   . Cancer     kidney  . Helicobacter pylori gastritis March 2014  . Candida esophagitis March 2014  . On supplemental oxygen therapy     2-3L/min and on BPAP @ HS  . Hypertension     "he doesnt take medication anymore because no longer has high blood pressure" per family    MEDICATIONS:  Prior to Admission medications   Medication Sig Start Date End Date Taking? Authorizing Provider  albuterol (PROVENTIL HFA;VENTOLIN HFA) 108 (90 BASE) MCG/ACT inhaler Inhale 2 puffs into the lungs every 6 (six) hours as needed. For shortness of breath    Yes Historical Provider, MD  albuterol (PROVENTIL) (2.5 MG/3ML) 0.083% nebulizer solution Take 2.5 mg by nebulization 3 (three) times daily as needed for wheezing or shortness of breath.  12/14/11  Yes Fredirick Maudlin, MD  busPIRone (BUSPAR) 5 MG tablet Take 1 tablet (5 mg total) by mouth 3 (three) times daily. 05/22/13  Yes Fredirick Maudlin, MD   cyclobenzaprine (FLEXERIL) 10 MG tablet Take 10 mg by mouth 2 (two) times daily.    Yes Historical Provider, MD  Dextromethorphan-Guaifenesin (MUCINEX DM) 30-600 MG TB12 Take 1 tablet by mouth daily.   Yes Historical Provider, MD  Fluticasone Furoate-Vilanterol (BREO ELLIPTA) 100-25 MCG/INH AEPB Inhale 1 puff into the lungs daily.    Yes Historical Provider, MD  furosemide (LASIX) 40 MG tablet Take 40 mg by mouth daily as needed for fluid.    Yes Historical Provider, MD  gabapentin (NEURONTIN) 300 MG capsule Take 300 mg by mouth at bedtime.   Yes Historical Provider, MD  HYDROcodone-acetaminophen (NORCO/VICODIN) 5-325 MG per tablet Take 1 tablet by mouth every 6 (six) hours as needed for pain.   Yes Historical Provider, MD  IRON PO Take 1 tablet by mouth daily.   Yes Historical Provider, MD  Milnacipran (SAVELLA) 50 MG TABS Take 50 mg by mouth 2 (two) times daily.    Yes Historical Provider, MD  predniSONE (DELTASONE) 10 MG tablet Take 1 tablet (10 mg total) by mouth daily. 05/22/13  Yes Fredirick Maudlin, MD  roflumilast (DALIRESP) 500 MCG TABS tablet Take 1 tablet (500 mcg total) by mouth daily. 12/14/11  Yes Fredirick Maudlin, MD  tadalafil (CIALIS) 5 MG tablet Take 5 mg by mouth daily as needed for erectile dysfunction.    Yes Historical Provider, MD  tiotropium (SPIRIVA) 18 MCG inhalation capsule Place 18 mcg  into inhaler and inhale daily.     Yes Historical Provider, MD    ALLERGIES:  Allergies  Allergen Reactions  . Morphine And Related Other (See Comments)    Paralysis, "shuts down kidney and bowel tract"   . Other     All narcotics cause paralysis, and shuts down kidney and bowel tract     SOCIAL HISTORY:  History  Substance Use Topics  . Smoking status: Former Smoker -- 1.00 packs/day for 42 years    Types: Cigarettes    Quit date: 09/20/2012  . Smokeless tobacco: Not on file  . Alcohol Use: No     Comment: no ETOH in 30 years     FAMILY HISTORY: Family History  Problem  Relation Age of Onset  . Hypertension Mother   . Colon cancer Paternal Grandmother     EXAM: Triage Vitals: BP 113/89  Pulse 116  Temp(Src) 98 F (36.7 C) (Oral)  Ht 5\' 11"  (1.803 m)  Wt 152 lb (68.947 kg)  BMI 21.21 kg/m2  SpO2 97% CONSTITUTIONAL: Alert and oriented and responds appropriately to questions. Well-appearing; well-nourished HEAD: Normocephalic EYES: Conjunctivae clear, PERRL ENT: normal nose; no rhinorrhea; moist mucous membranes; pharynx without lesions noted NECK: Supple, no meningismus, no LAD  CARD: Tachycardic; S1 and S2 appreciated; no murmurs, no clicks, no rubs, no gallops RESP: diminished breath sounds bilaterally, minimal expiratory wheezes, mild respiratory distress, speaks in short sentences, tachypnea  ABD/GI: Normal bowel sounds; non-distended; soft, non-tender, no rebound, no guarding BACK:  The back appears normal and is non-tender to palpation, there is no CVA tenderness EXT: Normal ROM in all joints; non-tender to palpation; no edema; normal capillary refill; no cyanosis    SKIN: Normal color for age and race; warm NEURO: Moves all extremities equally PSYCH: The patient's mood and manner are appropriate. Grooming and personal hygiene are appropriate.  MEDICAL DECISION MAKING: Patient with mild respiratory distress with hypoxia, tachycardia, tachypnea. Labs show leukocytosis with left shift and chest x-ray is concerning for right-sided infiltrate. No recent hospitalization. Will start on antibiotics for community-acquired pneumonia. Will obtain ABG. We will continue albuterol nebulizer treatments, steroids. Have recommended admission.  ED PROGRESS: ABG shows CO2 retention, respiratory acidosis. We'll start BiPAP. Discussed with hospitalist for admission.  Results for orders placed during the hospital encounter of 06/30/13  CBC WITH DIFFERENTIAL      Result Value Range   WBC 17.6 (*) 4.0 - 10.5 K/uL   RBC 4.72  4.22 - 5.81 MIL/uL   Hemoglobin 11.3  (*) 13.0 - 17.0 g/dL   HCT 40.9  81.1 - 91.4 %   MCV 83.1  78.0 - 100.0 fL   MCH 23.9 (*) 26.0 - 34.0 pg   MCHC 28.8 (*) 30.0 - 36.0 g/dL   RDW 78.2 (*) 95.6 - 21.3 %   Platelets 296  150 - 400 K/uL   Neutrophils Relative % 92 (*) 43 - 77 %   Neutro Abs 16.3 (*) 1.7 - 7.7 K/uL   Lymphocytes Relative 3 (*) 12 - 46 %   Lymphs Abs 0.6 (*) 0.7 - 4.0 K/uL   Monocytes Relative 4  3 - 12 %   Monocytes Absolute 0.6  0.1 - 1.0 K/uL   Eosinophils Relative 1  0 - 5 %   Eosinophils Absolute 0.2  0.0 - 0.7 K/uL   Basophils Relative 0  0 - 1 %   Basophils Absolute 0.0  0.0 - 0.1 K/uL  BASIC METABOLIC PANEL  Result Value Range   Sodium 138  135 - 145 mEq/L   Potassium 4.0  3.5 - 5.1 mEq/L   Chloride 91 (*) 96 - 112 mEq/L   CO2 44 (*) 19 - 32 mEq/L   Glucose, Bld 124 (*) 70 - 99 mg/dL   BUN 12  6 - 23 mg/dL   Creatinine, Ser 1.61  0.50 - 1.35 mg/dL   Calcium 9.5  8.4 - 09.6 mg/dL   GFR calc non Af Amer 71 (*) >90 mL/min   GFR calc Af Amer 82 (*) >90 mL/min  TROPONIN I      Result Value Range   Troponin I <0.30  <0.30 ng/mL  BLOOD GAS, ARTERIAL      Result Value Range   O2 Content 3.0     pH, Arterial 7.331 (*) 7.350 - 7.450   pCO2 arterial 79.2 (*) 35.0 - 45.0 mmHg   pO2, Arterial 122.0 (*) 80.0 - 100.0 mmHg   Bicarbonate 40.7 (*) 20.0 - 24.0 mEq/L   TCO2 38.0  0 - 100 mmol/L   Acid-Base Excess 14.3 (*) 0.0 - 2.0 mmol/L   O2 Saturation 98.4     Patient temperature 37.0     Collection site LEFT RADIAL     Drawn by [id number]     Sample type ARTERIAL     Allens test (pass/fail) PASS  PASS      Layla Maw Demarlo Riojas, DO 06/30/13 1642

## 2013-06-30 NOTE — ED Notes (Signed)
CRITICAL VALUE ALERT  Critical value received:  CO2 44  Date of notification:  06/30/13  Time of notification:  1159  Critical value read back:yes  Nurse who received alert:  GM  MD notified (1st page):  1159  Time of first page:  1159  MD notified (2nd page):  Time of second page:  Responding MD:  Dr. Elesa Massed  Time MD responded:  1159

## 2013-06-30 NOTE — ED Notes (Signed)
Patient to ED c/o shortness of breath and fever that started last night. Patient has COPD and states that he has SOB on and off chronically. 101 fever last taken this morning at home. Denies nausea, vomiting, diarrhea. States appetite is a little depressed. Nebulizer taken at 0800 at home, Tylenol last given at 0300 this morning. Patient appears tired. Alert and oriented X4.

## 2013-06-30 NOTE — Progress Notes (Signed)
ANTIBIOTIC CONSULT NOTE - INITIAL  Pharmacy Consult for Renal Adjustment Antibiotics Indication: rule out pneumonia  Allergies  Allergen Reactions  . Morphine And Related Other (See Comments)    Paralysis, "shuts down kidney and bowel tract"   . Other     All narcotics cause paralysis, and shuts down kidney and bowel tract     Patient Measurements: Height: 5\' 11"  (180.3 cm) Weight: 156 lb 8.4 oz (71 kg) IBW/kg (Calculated) : 75.3 Adjusted Body Weight:   Vital Signs: Temp: 97.5 F (36.4 C) (10/21 1424) Temp src: Oral (10/21 1424) BP: 137/89 mmHg (10/21 1424) Pulse Rate: 111 (10/21 1422) Intake/Output from previous day:   Intake/Output from this shift:    Labs:  Recent Labs  06/30/13 1124  WBC 17.6*  HGB 11.3*  PLT 296  CREATININE 1.09   Estimated Creatinine Clearance: 70.6 ml/min (by C-G formula based on Cr of 1.09). No results found for this basename: VANCOTROUGH, VANCOPEAK, VANCORANDOM, GENTTROUGH, GENTPEAK, GENTRANDOM, TOBRATROUGH, TOBRAPEAK, TOBRARND, AMIKACINPEAK, AMIKACINTROU, AMIKACIN,  in the last 72 hours   Microbiology: No results found for this or any previous visit (from the past 720 hour(s)).  Medical History: Past Medical History  Diagnosis Date  . COPD (chronic obstructive pulmonary disease)   . Fibromyalgia   . Shingles   . Cancer     kidney  . Helicobacter pylori gastritis March 2014  . Candida esophagitis March 2014  . On supplemental oxygen therapy     3L/min and on BPAP @ HS  . Hypertension     "he doesnt take medication anymore because no longer has high blood pressure" per family    Medications:  Scheduled:  . ipratropium  0.5 mg Nebulization Q6H   And  . albuterol  2.5 mg Nebulization Q6H  . albuterol      . antiseptic oral rinse  15 mL Mouth Rinse q12n4p  . azithromycin  500 mg Intravenous Q24H  . busPIRone  5 mg Oral TID  . cefTRIAXone (ROCEPHIN)  IV  1 g Intravenous Q24H  . chlorhexidine  15 mL Mouth Rinse BID  .  cyclobenzaprine  10 mg Oral BID  . enoxaparin (LOVENOX) injection  40 mg Subcutaneous Q24H  . gabapentin  300 mg Oral QHS  . ipratropium      . methylPREDNISolone (SOLU-MEDROL) injection  40 mg Intravenous Q6H  . Milnacipran  50 mg Oral BID  . sodium chloride  3 mL Intravenous Q12H  . sodium chloride  3 mL Intravenous Q12H   Assessment: CrCl 70.6 ml/min Rocephin 1 GM IV every 24 hours Azithromycin 500 mg IV every 24 hours  Goal of Therapy:  Eradicate infection  Plan:  No renal adjustment necessary for present antibiotic therapy. Continue Rocephin 1 GM IV every 24 hours Continue Azithromycin 500 mg IV every 24 hours  Raquel James, Randy Whitener Bennett 06/30/2013,3:05 PM

## 2013-06-30 NOTE — ED Provider Notes (Signed)
Date: 06/30/2013 10:48 AM  Rate: 120  Rhythm: Sinus tachycardia  QRS Axis: normal  Intervals: normal  ST/T Wave abnormalities: normal  Conduction Disutrbances: none  Narrative Interpretation: Sinus tachycardia, biatrial enlargement, no ectopy, no new ischemic changes      Layla Maw Hannah Crill, DO 06/30/13 1643

## 2013-06-30 NOTE — ED Notes (Signed)
CRITICAL VALUE ALERT  Critical value received:  ABG CO2 79.2  Date of notification:  06/30/13  Time of notification:  1221  Critical value read back:yes  Nurse who received alert:  Santiago Bur, RN  MD notified (1st page  Time of first page:   MD notified (2nd page):  Time of second page:  Responding MD:  Dr. Elesa Massed  Time MD responded:  [phone]

## 2013-06-30 NOTE — Progress Notes (Signed)
Patient wanted to come off bipap for a while, will put back on patient. RN informed to call me when he's ready

## 2013-06-30 NOTE — H&P (Signed)
History and Physical  Connor Armstrong ZOX:[id number] DOB: 1951-09-06 DOA: 06/30/2013  Referring physician: Dr. Kirtland Bouchard Ward in ED PCP: Fredirick Maudlin, MD   Chief Complaint: fever  HPI:  62 year old man presented to the emergency department with worsening shortness of breath. Initial evaluation notable for acute respiratory acidosis, acute renal failure, chest x-ray suggested developing pneumonia and patient was started on BiPAP and referred for admission.  History obtained from patient and wife at bedside. He has a history of chronic respiratory failure, oxygen dependent COPD on 3 L per minute nasal cannula during the day and BiPAP at night. He has been at baseline until the last few days when he developed progressive shortness of breath unrelieved with his usual medications. He developed fever yesterday and again today and became more and more short of breath.  In the emergency department he was noted to be afebrile, mildly tachycardic, normotensive. Hypoxic. ABG revealed respiratory acidosis with CO2 retention. This metabolic panel nonacute, troponin negative, blood cell count 17.6. Chest x-ray suggested developing pneumonia. EKG not acute.  Review of Systems:  Negative for visual changes, sore throat, rash, new muscle aches, chest pain, dysuria, bleeding, n/v/abdominal pain. Positive for chronic urinary difficulties   Past Medical History  Diagnosis Date  . COPD (chronic obstructive pulmonary disease)   . Fibromyalgia   . Shingles   . Cancer     kidney  . Helicobacter pylori gastritis March 2014  . Candida esophagitis March 2014  . On supplemental oxygen therapy     3L/min and on BPAP @ HS  . Hypertension     "he doesnt take medication anymore because no longer has high blood pressure" per family    Past Surgical History  Procedure Laterality Date  . Hernia repair    . Thyroid surgery    . Parathyroid surgery      benign tumor  . Partial nephrectomy  2005    right  .  Flexible sigmoidoscopy  09/27/2012    WJX:[id number]/MVHQIONGEXB  . Esophagogastroduodenoscopy (egd) with esophageal dilation N/A 11/21/2012    SLF: DYSPHAGIA MOST LIKELY DUE TO CANDIDA ESOPHAGITIS/Small hiatal hernia/ MODERATE Non-erosive gastritis with +H.PYLORI ON PATH  . Colonoscopy N/A 01/20/2013    SLF:9 COLON POLYPS REMOVED/MILD diverticulosis in the descending colon and sigmoid colon/MODERATE INTERNAL HEMORRHOIDS  . Esophagogastroduodenoscopy (egd) with propofol N/A 01/27/2013    MWU:XLKG WHITE PLAQUES IN THE ESOPHAGUS.  BRUSH BIOPSIES OBTAINED/Non-erosive gastritis (inflammation) was found in the gastric antrum; multiple bx/  . Savory dilation N/A 01/27/2013    Procedure: SAVORY DILATION;  Surgeon: West Bali, MD;  Location: [facility];  Service: Endoscopy;  Laterality: N/A;  . Esophageal biopsy N/A 01/27/2013    Procedure: BIOPSY;  Surgeon: West Bali, MD;  Location: [facility];  Service: Endoscopy;  Laterality: N/A;  Gastric Biopsies    Social History:  reports that he quit smoking about 9 months ago. His smoking use included Cigarettes. He has a 42 pack-year smoking history. He does not have any smokeless tobacco history on file. He reports that he does not drink alcohol or use illicit drugs.  Allergies  Allergen Reactions  . Morphine And Related Other (See Comments)    Paralysis, "shuts down kidney and bowel tract"   . Other     All narcotics cause paralysis, and shuts down kidney and bowel tract     Family History  Problem Relation Age of Onset  . Hypertension Mother   . Colon cancer Paternal Grandmother  Prior to Admission medications   Medication Sig Start Date End Date Taking? Authorizing Provider  albuterol (PROVENTIL HFA;VENTOLIN HFA) 108 (90 BASE) MCG/ACT inhaler Inhale 2 puffs into the lungs every 6 (six) hours as needed. For shortness of breath    Yes Historical Provider, MD  albuterol (PROVENTIL) (2.5 MG/3ML) 0.083% nebulizer solution Take 2.5 mg by  nebulization 3 (three) times daily as needed for wheezing or shortness of breath.  12/14/11  Yes Fredirick Maudlin, MD  busPIRone (BUSPAR) 5 MG tablet Take 1 tablet (5 mg total) by mouth 3 (three) times daily. 05/22/13  Yes Fredirick Maudlin, MD  cyclobenzaprine (FLEXERIL) 10 MG tablet Take 10 mg by mouth 2 (two) times daily.    Yes Historical Provider, MD  Dextromethorphan-Guaifenesin (MUCINEX DM) 30-600 MG TB12 Take 1 tablet by mouth daily.   Yes Historical Provider, MD  Fluticasone Furoate-Vilanterol (BREO ELLIPTA) 100-25 MCG/INH AEPB Inhale 1 puff into the lungs daily.    Yes Historical Provider, MD  furosemide (LASIX) 40 MG tablet Take 40 mg by mouth daily as needed for fluid.    Yes Historical Provider, MD  gabapentin (NEURONTIN) 300 MG capsule Take 300 mg by mouth at bedtime.   Yes Historical Provider, MD  HYDROcodone-acetaminophen (NORCO/VICODIN) 5-325 MG per tablet Take 1 tablet by mouth every 6 (six) hours as needed for pain.   Yes Historical Provider, MD  IRON PO Take 1 tablet by mouth daily.   Yes Historical Provider, MD  Milnacipran (SAVELLA) 50 MG TABS Take 50 mg by mouth 2 (two) times daily.    Yes Historical Provider, MD  predniSONE (DELTASONE) 10 MG tablet Take 1 tablet (10 mg total) by mouth daily. 05/22/13  Yes Fredirick Maudlin, MD  roflumilast (DALIRESP) 500 MCG TABS tablet Take 1 tablet (500 mcg total) by mouth daily. 12/14/11  Yes Fredirick Maudlin, MD  tadalafil (CIALIS) 5 MG tablet Take 5 mg by mouth daily as needed for erectile dysfunction.    Yes Historical Provider, MD  tiotropium (SPIRIVA) 18 MCG inhalation capsule Place 18 mcg into inhaler and inhale daily.     Yes Historical Provider, MD   Physical Exam: Filed Vitals:   06/30/13 1128 06/30/13 1200 06/30/13 1300 06/30/13 1331  BP: 113/89 157/87 154/113 154/113  Pulse: 116 110 115   Temp:      TempSrc:      Resp:  24 25 22   Height:      Weight:      SpO2: 97% 94% 91% 97%   General: Examined in the emergency department.  Appears calm and comfortable on BiPAP Eyes: PERRL, normal lids, irises  ENT: grossly normal hearing, lips & tongue Neck: no LAD, masses or thyromegaly Cardiovascular: RRR, no m/r/g. No LE edema. Telemetry: SR, no arrhythmias  Respiratory: Diminished breath sounds bilaterally, poor airflow, prolonged expiration. No frank wheezes, rales or rhonchi. Mild to moderate increased respiratory effort. Abdomen: soft, ntnd Skin: no rash or induration seen Musculoskeletal: grossly normal tone BUE/BLE Psychiatric: grossly normal mood and affect, speech fluent and appropriate Neurologic: grossly non-focal.  Wt Readings from Last 3 Encounters:  06/30/13 68.947 kg (152 lb)  05/22/13 68.4 kg (150 lb 12.7 oz)  04/16/13 70.8 kg (156 lb 1.4 oz)    Labs on Admission:  Basic Metabolic Panel:  Recent Labs Lab 06/30/13 1124  NA 138  K 4.0  CL 91*  CO2 44*  GLUCOSE 124*  BUN 12  CREATININE 1.09  CALCIUM 9.5    CBC:  Recent  Labs Lab 06/30/13 1124  WBC 17.6*  NEUTROABS 16.3*  HGB 11.3*  HCT 39.2  MCV 83.1  PLT 296    Cardiac Enzymes:  Recent Labs Lab 06/30/13 1124  TROPONINI <0.30     Radiological Exams on Admission: Dg Chest 2 View  06/30/2013   CLINICAL DATA:  Fever, shortness of breath  EXAM: CHEST  2 VIEW  COMPARISON:  05/19/2013  FINDINGS: Cardiomediastinal silhouette is stable. Hyperinflation emphysematous changes and chronic fibrotic changes again noted. Persistent right hilar prominence. There is somewhat asymmetric haziness in right perihilar region. I cannot exclude superimposed evolving infiltrate. Clinical correlation is necessary. Follow-up to resolution is recommended.  IMPRESSION: Hyperinflation emphysematous changes and chronic fibrotic changes again noted. Persistent right hilar prominence. There is somewhat asymmetric haziness in right perihilar region. I cannot exclude superimposed evolving infiltrate. Clinical correlation is necessary. Follow-up to resolution is  recommended.   Electronically Signed   By: Natasha Mead M.D.   On: 06/30/2013 11:30   ABG    Component Value Date/Time   PHART 7.331* 06/30/2013 1215   PCO2ART 79.2* 06/30/2013 1215   PO2ART 122.0* 06/30/2013 1215   HCO3 40.7* 06/30/2013 1215   TCO2 38.0 06/30/2013 1215   ACIDBASEDEF 16.8* 05/19/2013 1525   O2SAT 98.4 06/30/2013 1215     EKG: Independently reviewed. Sinus tachycardia, biatrial enlargement. No acute changes.   Principal Problem:   Acute-on-chronic respiratory failure Active Problems:   HCAP (healthcare-associated pneumonia)   COPD (chronic obstructive pulmonary disease)   Assessment/Plan 1. Acute hypercapnic on chronic hypoxic/hypercarbic respiratory failure with acute respiratory acidosis: Secondary to developing pneumonia with underlying COPD. Admit to step down unit for BiPAP, antibiotics, steroids, nebulizers. No indication for intubation at this point. 2. Oxygen-dependent COPD 3 L 3. Hypertension  Code Status: Full code, confirmed  DVT prophylaxis: Lovenox Family Communication: Discussed above with wife at bedside Disposition Plan/Anticipated LOS: Admit to Dr. Juanetta Gosling service, 2-4 days.  Time spent: 50 minutes  Brendia Sacks, MD  [facility] Pager [phone] 06/30/2013, 1:46 PM

## 2013-07-01 LAB — CBC
HCT: 35.8 % — ABNORMAL LOW (ref 39.0–52.0)
Hemoglobin: 10.4 g/dL — ABNORMAL LOW (ref 13.0–17.0)
MCHC: 29.1 g/dL — ABNORMAL LOW (ref 30.0–36.0)
Platelets: 330 10*3/uL (ref 150–400)

## 2013-07-01 LAB — BASIC METABOLIC PANEL
BUN: 16 mg/dL (ref 6–23)
Calcium: 9.3 mg/dL (ref 8.4–10.5)
Chloride: 94 mEq/L — ABNORMAL LOW (ref 96–112)
GFR calc non Af Amer: 86 mL/min — ABNORMAL LOW (ref >90)
Glucose, Bld: 154 mg/dL — ABNORMAL HIGH (ref 70–99)
Sodium: 140 mEq/L (ref 135–145)

## 2013-07-01 MED ORDER — AZITHROMYCIN 250 MG PO TABS: 500.0000 mg | ORAL_TABLET | Freq: Every day | ORAL | Status: DC

## 2013-07-01 NOTE — Progress Notes (Signed)
UR chart review completed.  

## 2013-07-01 NOTE — Progress Notes (Signed)
Subjective: He was admitted yesterday with pneumonia and acute on chronic respiratory failure. He use BiPAP last night and says he's doing okay. He has no other new complaints. He's not coughing very much. He says he feels better than yesterday.  Objective: Vital signs in last 24 hours: Temp:  [97.1 F (36.2 C)-98.3 F (36.8 C)] 97.5 F (36.4 C) (10/22 0400) Pulse Rate:  [98-126] 98 (10/22 0300) Resp:  [17-25] 21 (10/22 0300) BP: (113-157)/(58-113) 134/76 mmHg (10/22 0009) SpO2:  [79 %-99 %] 97 % (10/22 0721) FiO2 (%):  [35 %] 35 % (10/22 0149) Weight:  [68.947 kg (152 lb)-72.1 kg (158 lb 15.2 oz)] 72.1 kg (158 lb 15.2 oz) (10/22 0500) Weight change:  Last BM Date: 06/29/13  Intake/Output from previous day: 10/21 0701 - 10/22 0700 In: 780 [P.O.:480; IV Piggyback:300] Out: 1300 [Urine:1300]  PHYSICAL EXAM General appearance: alert, cooperative and mild distress Resp: His chest is clear but he has markedly diminished breath sounds and prolonged expiration Cardio: regular rate and rhythm, S1, S2 normal, no murmur, click, rub or gallop GI: soft, non-tender; bowel sounds normal; no masses,  no organomegaly Extremities: extremities normal, atraumatic, no cyanosis or edema  Lab Results:    Basic Metabolic Panel:  Recent Labs  09/81/19 1124 07/01/13 0419  NA 138 140  K 4.0 4.5  CL 91* 94*  CO2 44* 42*  GLUCOSE 124* 154*  BUN 12 16  CREATININE 1.09 0.98  CALCIUM 9.5 9.3   Liver Function Tests: No results found for this basename: AST, ALT, ALKPHOS, BILITOT, PROT, ALBUMIN,  in the last 72 hours No results found for this basename: LIPASE, AMYLASE,  in the last 72 hours No results found for this basename: AMMONIA,  in the last 72 hours CBC:  Recent Labs  06/30/13 1124 07/01/13 0419  WBC 17.6* 12.5*  NEUTROABS 16.3*  --   HGB 11.3* 10.4*  HCT 39.2 35.8*  MCV 83.1 81.9  PLT 296 330   Cardiac Enzymes:  Recent Labs  06/30/13 1124  TROPONINI <0.30   BNP: No  results found for this basename: PROBNP,  in the last 72 hours D-Dimer: No results found for this basename: DDIMER,  in the last 72 hours CBG: No results found for this basename: GLUCAP,  in the last 72 hours Hemoglobin A1C: No results found for this basename: HGBA1C,  in the last 72 hours Fasting Lipid Panel: No results found for this basename: CHOL, HDL, LDLCALC, TRIG, CHOLHDL, LDLDIRECT,  in the last 72 hours Thyroid Function Tests: No results found for this basename: TSH, T4TOTAL, FREET4, T3FREE, THYROIDAB,  in the last 72 hours Anemia Panel: No results found for this basename: VITAMINB12, FOLATE, FERRITIN, TIBC, IRON, RETICCTPCT,  in the last 72 hours Coagulation: No results found for this basename: LABPROT, INR,  in the last 72 hours Urine Drug Screen: Drugs of Abuse  No results found for this basename: labopia, cocainscrnur, labbenz, amphetmu, thcu, labbarb    Alcohol Level: No results found for this basename: ETH,  in the last 72 hours Urinalysis: No results found for this basename: COLORURINE, APPERANCEUR, LABSPEC, PHURINE, GLUCOSEU, HGBUR, BILIRUBINUR, KETONESUR, PROTEINUR, UROBILINOGEN, NITRITE, LEUKOCYTESUR,  in the last 72 hours Misc. Labs:  ABGS  Recent Labs  06/30/13 1215  PHART 7.331*  PO2ART 122.0*  TCO2 38.0  HCO3 40.7*   CULTURES Recent Results (from the past 240 hour(s))  MRSA PCR SCREENING     Status: None   Collection Time    06/30/13  2:22  PM      Result Value Range Status   MRSA by PCR NEGATIVE  NEGATIVE Final   Comment:            The GeneXpert MRSA Assay (FDA     approved for NASAL specimens     only), is one component of a     comprehensive MRSA colonization     surveillance program. It is not     intended to diagnose MRSA     infection nor to guide or     monitor treatment for     MRSA infections.   Studies/Results: Dg Chest 2 View  06/30/2013   CLINICAL DATA:  Fever, shortness of breath  EXAM: CHEST  2 VIEW  COMPARISON:  05/19/2013   FINDINGS: Cardiomediastinal silhouette is stable. Hyperinflation emphysematous changes and chronic fibrotic changes again noted. Persistent right hilar prominence. There is somewhat asymmetric haziness in right perihilar region. I cannot exclude superimposed evolving infiltrate. Clinical correlation is necessary. Follow-up to resolution is recommended.  IMPRESSION: Hyperinflation emphysematous changes and chronic fibrotic changes again noted. Persistent right hilar prominence. There is somewhat asymmetric haziness in right perihilar region. I cannot exclude superimposed evolving infiltrate. Clinical correlation is necessary. Follow-up to resolution is recommended.   Electronically Signed   By: Natasha Mead M.D.   On: 06/30/2013 11:30    Medications:  Prior to Admission:  Prescriptions prior to admission  Medication Sig Dispense Refill  . albuterol (PROVENTIL HFA;VENTOLIN HFA) 108 (90 BASE) MCG/ACT inhaler Inhale 2 puffs into the lungs every 6 (six) hours as needed. For shortness of breath       . albuterol (PROVENTIL) (2.5 MG/3ML) 0.083% nebulizer solution Take 2.5 mg by nebulization 3 (three) times daily as needed for wheezing or shortness of breath.       . busPIRone (BUSPAR) 5 MG tablet Take 1 tablet (5 mg total) by mouth 3 (three) times daily.  90 tablet  5  . cyclobenzaprine (FLEXERIL) 10 MG tablet Take 10 mg by mouth 2 (two) times daily.       Marland Kitchen Dextromethorphan-Guaifenesin (MUCINEX DM) 30-600 MG TB12 Take 1 tablet by mouth daily.      . Fluticasone Furoate-Vilanterol (BREO ELLIPTA) 100-25 MCG/INH AEPB Inhale 1 puff into the lungs daily.       . furosemide (LASIX) 40 MG tablet Take 40 mg by mouth daily as needed for fluid.       Marland Kitchen gabapentin (NEURONTIN) 300 MG capsule Take 300 mg by mouth at bedtime.      Marland Kitchen HYDROcodone-acetaminophen (NORCO/VICODIN) 5-325 MG per tablet Take 1 tablet by mouth every 6 (six) hours as needed for pain.      . IRON PO Take 1 tablet by mouth daily.      . Milnacipran  (SAVELLA) 50 MG TABS Take 50 mg by mouth 2 (two) times daily.       . predniSONE (DELTASONE) 10 MG tablet Take 1 tablet (10 mg total) by mouth daily.  100 tablet  5  . roflumilast (DALIRESP) 500 MCG TABS tablet Take 1 tablet (500 mcg total) by mouth daily.  30 tablet  12  . tadalafil (CIALIS) 5 MG tablet Take 5 mg by mouth daily as needed for erectile dysfunction.       Marland Kitchen tiotropium (SPIRIVA) 18 MCG inhalation capsule Place 18 mcg into inhaler and inhale daily.         Scheduled: . ipratropium  0.5 mg Nebulization Q6H   And  . albuterol  2.5 mg Nebulization Q6H  . antiseptic oral rinse  15 mL Mouth Rinse q12n4p  . azithromycin  500 mg Intravenous Q24H  . busPIRone  5 mg Oral TID  . cefTRIAXone (ROCEPHIN)  IV  1 g Intravenous Q24H  . chlorhexidine  15 mL Mouth Rinse BID  . cyclobenzaprine  10 mg Oral BID  . enoxaparin (LOVENOX) injection  40 mg Subcutaneous Q24H  . gabapentin  300 mg Oral QHS  . methylPREDNISolone (SOLU-MEDROL) injection  40 mg Intravenous Q6H  . Milnacipran  50 mg Oral BID  . sodium chloride  3 mL Intravenous Q12H  . sodium chloride  3 mL Intravenous Q12H   Continuous:  UJW:JXBJYN chloride, acetaminophen, acetaminophen, albuterol, HYDROcodone-acetaminophen, ondansetron (ZOFRAN) IV, ondansetron, sodium chloride  Assesment: He has healthcare associated pneumonia. He has severe COPD. He has fibromyalgia which has caused him a lot of pain. His COPD is end-stage and I have referred him for potential lung transplantation. Principal Problem:   Acute-on-chronic respiratory failure Active Problems:   HCAP (healthcare-associated pneumonia)   COPD (chronic obstructive pulmonary disease)   Respiratory acidosis    Plan: Continue current treatments    LOS: 1 day   Jennifr Gaeta L 07/01/2013, 8:16 AM

## 2013-07-01 NOTE — Progress Notes (Signed)
PHARMACIST - PHYSICIAN COMMUNICATION DR:   Hawkins CONCERNING: Antibiotic IV to Oral Route Change Policy  RECOMMENDATION: This patient is receiving Zithromax by the intravenous route.  Based on criteria approved by the Pharmacy and Therapeutics Committee, the antibiotic(s) is/are being converted to the equivalent oral dose form(s).   DESCRIPTION: These criteria include:  Patient being treated for a respiratory tract infection, urinary tract infection, cellulitis or clostridium difficile associated diarrhea if on metronidazole  The patient is not neutropenic and does not exhibit a GI malabsorption state  The patient is eating (either orally or via tube) and/or has been taking other orally administered medications for a least 24 hours  The patient is improving clinically and has a Tmax < 100.5  If you have questions about this conversion, please contact the Pharmacy Department  [x]  ( [phone] )  [place] []  ( [phone]  [place]  []  ( [phone] )  [facility] []  ( [phone] )  [facility]   S. Senai Kingsley, PharmD  

## 2013-07-01 NOTE — Progress Notes (Signed)
Pt appears to be resting comfortably, vss, no c/o pain, low  Bed, HOB > 30 deg, call bell at pt's side, family at bedside, will cont to monitor

## 2013-07-01 NOTE — Care Management Note (Signed)
    Page 1 of 2   07/03/2013     3:16:20 PM   CARE MANAGEMENT NOTE 07/03/2013  Patient:  Connor Armstrong, Connor Armstrong   Account Number:  [id number]  Date Initiated:  07/01/2013  Documentation initiated by:  Sharrie Rothman  Subjective/Objective Assessment:   Pt admitted from home with pneumonia and respiratory failure. Pt lives with his wife and will return at discharge. Pt was just discharged from [facility] services. Pt has Home O2, bipap, nebs, w/c, and walker for home.     Action/Plan:   Pt agreeable to restart HH at discharge with [facility]. No other DME needs noted at this time.   Anticipated DC Date:  07/04/2013   Anticipated DC Plan:  HOME W HOME HEALTH SERVICES      DC Planning Services  CM consult      [facility] Choice  HOME HEALTH   Choice offered to / List presented to:  C-1 Patient        HH arranged  HH-1 RN  HH-2 PT  HH-7 RESPIRATORY THERAPY      HH agency  [facility]   Status of service:  Completed, signed off Medicare Important Message given?  YES (If response is "NO", the following Medicare IM given date fields will be blank) Date Medicare IM given:  07/03/2013 Date Additional Medicare IM given:    Discharge Disposition:  HOME W HOME HEALTH SERVICES  Per UR Regulation:    If discussed at Long Length of Stay Meetings, dates discussed:    Comments:  07/03/13 1515 [name], RN BSN CM Pt potential discharge over the weekend. HH has been arranged with [facility] and linda [place] of [facility] is aware and will collect pts information from the chart. HH services to start within 48 hours of discharge. Pt and pts nurse aware of discharge arrangements. Weekend staff will need to call [facility] at discharge.  07/01/13 1512 [name], RN BSN CM

## 2013-07-02 LAB — EXPECTORATED SPUTUM ASSESSMENT W GRAM STAIN, RFLX TO RESP C

## 2013-07-02 MED ORDER — VANCOMYCIN HCL 10 G IV SOLR
1500.0000 mg | Freq: Once | INTRAVENOUS | Status: AC
  Administered 2013-07-02: 1500 mg via INTRAVENOUS
  Filled 2013-07-02: qty 1500

## 2013-07-02 MED ORDER — MOMETASONE FURO-FORMOTEROL FUM 100-5 MCG/ACT IN AERO
2.0000 | INHALATION_SPRAY | Freq: Two times a day (BID) | RESPIRATORY_TRACT | Status: DC
  Administered 2013-07-02 – 2013-07-04 (×5): 2 via RESPIRATORY_TRACT
  Filled 2013-07-02: qty 8.8

## 2013-07-02 MED ORDER — DEXTROSE 5 % IV SOLN
1.0000 g | Freq: Three times a day (TID) | INTRAVENOUS | Status: DC
  Administered 2013-07-02 – 2013-07-04 (×7): 1 g via INTRAVENOUS
  Filled 2013-07-02 (×13): qty 1

## 2013-07-02 MED ORDER — VANCOMYCIN HCL IN DEXTROSE 1-5 GM/200ML-% IV SOLN
1000.0000 mg | Freq: Two times a day (BID) | INTRAVENOUS | Status: DC
  Administered 2013-07-02 – 2013-07-04 (×4): 1000 mg via INTRAVENOUS
  Filled 2013-07-02 (×8): qty 200

## 2013-07-02 MED ORDER — ROFLUMILAST 500 MCG PO TABS
500.0000 ug | ORAL_TABLET | Freq: Every day | ORAL | Status: DC
  Administered 2013-07-02 – 2013-07-04 (×3): 500 ug via ORAL
  Filled 2013-07-02 (×5): qty 1

## 2013-07-02 NOTE — Progress Notes (Signed)
ANTIBIOTIC CONSULT NOTE - INITIAL  Pharmacy Consult for Vancomycin and Cefepime Indication: pneumonia  Allergies  Allergen Reactions  . Morphine And Related Other (See Comments)    Paralysis, "shuts down kidney and bowel tract"   . Other     All narcotics cause paralysis, and shuts down kidney and bowel tract     Patient Measurements: Height: 5\' 11"  (180.3 cm) Weight: 159 lb 6.3 oz (72.3 kg) IBW/kg (Calculated) : 75.3  Vital Signs: Temp: 97.8 F (36.6 C) (10/23 0400) Temp src: Axillary (10/23 0400) BP: 140/85 mmHg (10/23 0600) Pulse Rate: 98 (10/23 0600) Intake/Output from previous day: 10/22 0701 - 10/23 0700 In: 650 [P.O.:600; I.V.:30] Out: 1425 [Urine:1425] Intake/Output from this shift:    Labs:  Recent Labs  06/30/13 1124 07/01/13 0419  WBC 17.6* 12.5*  HGB 11.3* 10.4*  PLT 296 330  CREATININE 1.09 0.98   Estimated Creatinine Clearance: 79.9 ml/min (by C-G formula based on Cr of 0.98). No results found for this basename: VANCOTROUGH, Leodis Binet, VANCORANDOM, GENTTROUGH, GENTPEAK, GENTRANDOM, TOBRATROUGH, TOBRAPEAK, TOBRARND, AMIKACINPEAK, AMIKACINTROU, AMIKACIN,  in the last 72 hours   Microbiology: Recent Results (from the past 720 hour(s))  MRSA PCR SCREENING     Status: None   Collection Time    06/30/13  2:22 PM      Result Value Range Status   MRSA by PCR NEGATIVE  NEGATIVE Final   Comment:            The GeneXpert MRSA Assay (FDA     approved for NASAL specimens     only), is one component of a     comprehensive MRSA colonization     surveillance program. It is not     intended to diagnose MRSA     infection nor to guide or     monitor treatment for     MRSA infections.   Medical History: Past Medical History  Diagnosis Date  . COPD (chronic obstructive pulmonary disease)   . Fibromyalgia   . Shingles   . Cancer     kidney  . Helicobacter pylori gastritis March 2014  . Candida esophagitis March 2014  . On supplemental oxygen therapy      3L/min and on BPAP @ HS  . Hypertension     "he doesnt take medication anymore because no longer has high blood pressure" per family   Medications:  Scheduled:  . ipratropium  0.5 mg Nebulization Q6H   And  . albuterol  2.5 mg Nebulization Q6H  . antiseptic oral rinse  15 mL Mouth Rinse q12n4p  . busPIRone  5 mg Oral TID  . ceFEPime (MAXIPIME) IV  1 g Intravenous Q8H  . chlorhexidine  15 mL Mouth Rinse BID  . cyclobenzaprine  10 mg Oral BID  . enoxaparin (LOVENOX) injection  40 mg Subcutaneous Q24H  . gabapentin  300 mg Oral QHS  . methylPREDNISolone (SOLU-MEDROL) injection  40 mg Intravenous Q6H  . Milnacipran  50 mg Oral BID  . sodium chloride  3 mL Intravenous Q12H  . sodium chloride  3 mL Intravenous Q12H  . vancomycin  1,500 mg Intravenous Once  . vancomycin  1,000 mg Intravenous Q12H   Assessment: 62yo male admitted with pneumonia and acute on chronic respiratory failure.  Pt has good renal fxn.  Estimated Creatinine Clearance: 79.9 ml/min (by C-G formula based on Cr of 0.98).  Pt was on Zithromax and Rocephin but is being switched to Vancomycin and Cefepime.  Zithromax 10/21 >>  10/23 Rocephin 10/21 >> 10/23 Vancomycin 10/23 >> Cefepime 10/23 >>  Goal of Therapy:  Vancomycin trough level 15-20 mcg/ml  Plan:  Cefepime 1gm IV q8hrs Vancomycin 1500mg  IV x 1 dose now then Vancomycin 1000mg  IV q12hrs Check trough at steady state Monitor labs, renal fxn, and cultures  Valrie Hart A 07/02/2013,8:03 AM

## 2013-07-02 NOTE — Progress Notes (Signed)
Subjective: He says he feels okay but had more problems with hypoxia yesterday. He has no other new complaints. He feels tired.  Objective: Vital signs in last 24 hours: Temp:  [97.5 F (36.4 C)-98 F (36.7 C)] 97.5 F (36.4 C) (10/23 0817) Pulse Rate:  [97-116] 98 (10/23 0600) Resp:  [13-23] 20 (10/23 0600) BP: (96-147)/(57-92) 140/85 mmHg (10/23 0600) SpO2:  [92 %-100 %] 96 % (10/23 0656) FiO2 (%):  [35 %-36 %] 35 % (10/23 0656) Weight:  [72.3 kg (159 lb 6.3 oz)] 72.3 kg (159 lb 6.3 oz) (10/23 0500) Weight change: 3.353 kg (7 lb 6.3 oz) Last BM Date: 07/01/13 (per pt report)  Intake/Output from previous day: 10/22 0701 - 10/23 0700 In: 650 [P.O.:600; I.V.:30] Out: 1425 [Urine:1425]  PHYSICAL EXAM General appearance: alert, cooperative, mild distress and On BiPAP Resp: clear to auscultation bilaterally Cardio: regular rate and rhythm, S1, S2 normal, no murmur, click, rub or gallop GI: soft, non-tender; bowel sounds normal; no masses,  no organomegaly Extremities: extremities normal, atraumatic, no cyanosis or edema  Lab Results:    Basic Metabolic Panel:  Recent Labs  40/98/11 1124 07/01/13 0419  NA 138 140  K 4.0 4.5  CL 91* 94*  CO2 44* 42*  GLUCOSE 124* 154*  BUN 12 16  CREATININE 1.09 0.98  CALCIUM 9.5 9.3   Liver Function Tests: No results found for this basename: AST, ALT, ALKPHOS, BILITOT, PROT, ALBUMIN,  in the last 72 hours No results found for this basename: LIPASE, AMYLASE,  in the last 72 hours No results found for this basename: AMMONIA,  in the last 72 hours CBC:  Recent Labs  06/30/13 1124 07/01/13 0419  WBC 17.6* 12.5*  NEUTROABS 16.3*  --   HGB 11.3* 10.4*  HCT 39.2 35.8*  MCV 83.1 81.9  PLT 296 330   Cardiac Enzymes:  Recent Labs  06/30/13 1124  TROPONINI <0.30   BNP: No results found for this basename: PROBNP,  in the last 72 hours D-Dimer: No results found for this basename: DDIMER,  in the last 72 hours CBG: No  results found for this basename: GLUCAP,  in the last 72 hours Hemoglobin A1C: No results found for this basename: HGBA1C,  in the last 72 hours Fasting Lipid Panel: No results found for this basename: CHOL, HDL, LDLCALC, TRIG, CHOLHDL, LDLDIRECT,  in the last 72 hours Thyroid Function Tests: No results found for this basename: TSH, T4TOTAL, FREET4, T3FREE, THYROIDAB,  in the last 72 hours Anemia Panel: No results found for this basename: VITAMINB12, FOLATE, FERRITIN, TIBC, IRON, RETICCTPCT,  in the last 72 hours Coagulation: No results found for this basename: LABPROT, INR,  in the last 72 hours Urine Drug Screen: Drugs of Abuse  No results found for this basename: labopia, cocainscrnur, labbenz, amphetmu, thcu, labbarb    Alcohol Level: No results found for this basename: ETH,  in the last 72 hours Urinalysis: No results found for this basename: COLORURINE, APPERANCEUR, LABSPEC, PHURINE, GLUCOSEU, HGBUR, BILIRUBINUR, KETONESUR, PROTEINUR, UROBILINOGEN, NITRITE, LEUKOCYTESUR,  in the last 72 hours Misc. Labs:  ABGS  Recent Labs  06/30/13 1215  PHART 7.331*  PO2ART 122.0*  TCO2 38.0  HCO3 40.7*   CULTURES Recent Results (from the past 240 hour(s))  MRSA PCR SCREENING     Status: None   Collection Time    06/30/13  2:22 PM      Result Value Range Status   MRSA by PCR NEGATIVE  NEGATIVE Final   Comment:  The GeneXpert MRSA Assay (FDA     approved for NASAL specimens     only), is one component of a     comprehensive MRSA colonization     surveillance program. It is not     intended to diagnose MRSA     infection nor to guide or     monitor treatment for     MRSA infections.   Studies/Results: Dg Chest 2 View  06/30/2013   CLINICAL DATA:  Fever, shortness of breath  EXAM: CHEST  2 VIEW  COMPARISON:  05/19/2013  FINDINGS: Cardiomediastinal silhouette is stable. Hyperinflation emphysematous changes and chronic fibrotic changes again noted. Persistent right  hilar prominence. There is somewhat asymmetric haziness in right perihilar region. I cannot exclude superimposed evolving infiltrate. Clinical correlation is necessary. Follow-up to resolution is recommended.  IMPRESSION: Hyperinflation emphysematous changes and chronic fibrotic changes again noted. Persistent right hilar prominence. There is somewhat asymmetric haziness in right perihilar region. I cannot exclude superimposed evolving infiltrate. Clinical correlation is necessary. Follow-up to resolution is recommended.   Electronically Signed   By: Natasha Mead M.D.   On: 06/30/2013 11:30    Medications:  Prior to Admission:  Prescriptions prior to admission  Medication Sig Dispense Refill  . albuterol (PROVENTIL HFA;VENTOLIN HFA) 108 (90 BASE) MCG/ACT inhaler Inhale 2 puffs into the lungs every 6 (six) hours as needed. For shortness of breath       . albuterol (PROVENTIL) (2.5 MG/3ML) 0.083% nebulizer solution Take 2.5 mg by nebulization 3 (three) times daily as needed for wheezing or shortness of breath.       . busPIRone (BUSPAR) 5 MG tablet Take 1 tablet (5 mg total) by mouth 3 (three) times daily.  90 tablet  5  . cyclobenzaprine (FLEXERIL) 10 MG tablet Take 10 mg by mouth 2 (two) times daily.       Marland Kitchen Dextromethorphan-Guaifenesin (MUCINEX DM) 30-600 MG TB12 Take 1 tablet by mouth daily.      . Fluticasone Furoate-Vilanterol (BREO ELLIPTA) 100-25 MCG/INH AEPB Inhale 1 puff into the lungs daily.       . furosemide (LASIX) 40 MG tablet Take 40 mg by mouth daily as needed for fluid.       Marland Kitchen gabapentin (NEURONTIN) 300 MG capsule Take 300 mg by mouth at bedtime.      Marland Kitchen HYDROcodone-acetaminophen (NORCO/VICODIN) 5-325 MG per tablet Take 1 tablet by mouth every 6 (six) hours as needed for pain.      . IRON PO Take 1 tablet by mouth daily.      . Milnacipran (SAVELLA) 50 MG TABS Take 50 mg by mouth 2 (two) times daily.       . predniSONE (DELTASONE) 10 MG tablet Take 1 tablet (10 mg total) by mouth  daily.  100 tablet  5  . roflumilast (DALIRESP) 500 MCG TABS tablet Take 1 tablet (500 mcg total) by mouth daily.  30 tablet  12  . tadalafil (CIALIS) 5 MG tablet Take 5 mg by mouth daily as needed for erectile dysfunction.       Marland Kitchen tiotropium (SPIRIVA) 18 MCG inhalation capsule Place 18 mcg into inhaler and inhale daily.         Scheduled: . ipratropium  0.5 mg Nebulization Q6H   And  . albuterol  2.5 mg Nebulization Q6H  . antiseptic oral rinse  15 mL Mouth Rinse q12n4p  . busPIRone  5 mg Oral TID  . ceFEPime (MAXIPIME) IV  1 g Intravenous Q8H  .  chlorhexidine  15 mL Mouth Rinse BID  . cyclobenzaprine  10 mg Oral BID  . enoxaparin (LOVENOX) injection  40 mg Subcutaneous Q24H  . gabapentin  300 mg Oral QHS  . methylPREDNISolone (SOLU-MEDROL) injection  40 mg Intravenous Q6H  . Milnacipran  50 mg Oral BID  . sodium chloride  3 mL Intravenous Q12H  . sodium chloride  3 mL Intravenous Q12H  . vancomycin  1,500 mg Intravenous Once  . vancomycin  1,000 mg Intravenous Q12H   Continuous:  ZOX:WRUEAV chloride, acetaminophen, acetaminophen, albuterol, HYDROcodone-acetaminophen, ondansetron (ZOFRAN) IV, ondansetron, sodium chloride  Assesment: He is admitted with acute on chronic respiratory failure because of severe end-stage COPD. He has healthcare associated pneumonia and as I was reviewing his medications again today he is on treatment for community-acquired pneumonia so I have changed his antibiotics. Principal Problem:   Acute-on-chronic respiratory failure Active Problems:   HCAP (healthcare-associated pneumonia)   COPD (chronic obstructive pulmonary disease)   Respiratory acidosis    Plan: Continue current treatments he's going to stay in the ICU for now    LOS: 2 days   Levana Minetti L 07/02/2013, 8:41 AM

## 2013-07-03 LAB — STREP PNEUMONIAE URINARY ANTIGEN: Strep Pneumo Urinary Antigen: NEGATIVE

## 2013-07-03 LAB — VANCOMYCIN, TROUGH: Vancomycin Tr: 11.9 ug/mL (ref 10.0–20.0)

## 2013-07-03 MED ORDER — POLYETHYLENE GLYCOL 3350 17 G PO PACK
17.0000 g | PACK | Freq: Every day | ORAL | Status: DC
  Administered 2013-07-03 – 2013-07-04 (×2): 17 g via ORAL
  Filled 2013-07-03 (×2): qty 1

## 2013-07-03 NOTE — Progress Notes (Signed)
Subjective: He feels relatively well but he had 2 episodes of hypoxia yesterday. They did not appear to be associated with exertion. He has been up and moving around a little bit.  Objective: Vital signs in last 24 hours: Temp:  [97.3 F (36.3 C)-98.7 F (37.1 C)] 98.3 F (36.8 C) (10/24 0400) Pulse Rate:  [97-128] 98 (10/24 0400) Resp:  [15-25] 20 (10/24 0400) BP: (136-152)/(73-94) 141/73 mmHg (10/24 0645) SpO2:  [91 %-100 %] 98 % (10/24 0700) FiO2 (%):  [3 %-35 %] 3 % (10/24 0700) Weight:  [73 kg (160 lb 15 oz)] 73 kg (160 lb 15 oz) (10/24 0500) Weight change: 0.7 kg (1 lb 8.7 oz) Last BM Date: 07/02/13  Intake/Output from previous day: 10/23 0701 - 10/24 0700 In: 960 [P.O.:960] Out: 750 [Urine:750]  PHYSICAL EXAM General appearance: alert, cooperative and no distress Resp: He has somewhat diminished breath sounds. Cardio: regular rate and rhythm, S1, S2 normal, no murmur, click, rub or gallop GI: His abdomen is mildly distended and he says he's not had a bowel movement Extremities: extremities normal, atraumatic, no cyanosis or edema  Lab Results:    Basic Metabolic Panel:  Recent Labs  69/62/95 1124 07/01/13 0419  NA 138 140  K 4.0 4.5  CL 91* 94*  CO2 44* 42*  GLUCOSE 124* 154*  BUN 12 16  CREATININE 1.09 0.98  CALCIUM 9.5 9.3   Liver Function Tests: No results found for this basename: AST, ALT, ALKPHOS, BILITOT, PROT, ALBUMIN,  in the last 72 hours No results found for this basename: LIPASE, AMYLASE,  in the last 72 hours No results found for this basename: AMMONIA,  in the last 72 hours CBC:  Recent Labs  06/30/13 1124 07/01/13 0419  WBC 17.6* 12.5*  NEUTROABS 16.3*  --   HGB 11.3* 10.4*  HCT 39.2 35.8*  MCV 83.1 81.9  PLT 296 330   Cardiac Enzymes:  Recent Labs  06/30/13 1124  TROPONINI <0.30   BNP: No results found for this basename: PROBNP,  in the last 72 hours D-Dimer: No results found for this basename: DDIMER,  in the last 72  hours CBG: No results found for this basename: GLUCAP,  in the last 72 hours Hemoglobin A1C: No results found for this basename: HGBA1C,  in the last 72 hours Fasting Lipid Panel: No results found for this basename: CHOL, HDL, LDLCALC, TRIG, CHOLHDL, LDLDIRECT,  in the last 72 hours Thyroid Function Tests: No results found for this basename: TSH, T4TOTAL, FREET4, T3FREE, THYROIDAB,  in the last 72 hours Anemia Panel: No results found for this basename: VITAMINB12, FOLATE, FERRITIN, TIBC, IRON, RETICCTPCT,  in the last 72 hours Coagulation: No results found for this basename: LABPROT, INR,  in the last 72 hours Urine Drug Screen: Drugs of Abuse  No results found for this basename: labopia, cocainscrnur, labbenz, amphetmu, thcu, labbarb    Alcohol Level: No results found for this basename: ETH,  in the last 72 hours Urinalysis: No results found for this basename: COLORURINE, APPERANCEUR, LABSPEC, PHURINE, GLUCOSEU, HGBUR, BILIRUBINUR, KETONESUR, PROTEINUR, UROBILINOGEN, NITRITE, LEUKOCYTESUR,  in the last 72 hours Misc. Labs:  ABGS  Recent Labs  06/30/13 1215  PHART 7.331*  PO2ART 122.0*  TCO2 38.0  HCO3 40.7*   CULTURES Recent Results (from the past 240 hour(s))  CULTURE, BLOOD (ROUTINE X 2)     Status: None   Collection Time    06/30/13 11:43 AM      Result Value Range Status  Specimen Description BLOOD RIGHT ANTECUBITAL   Final   Special Requests     Final   Value: BOTTLES DRAWN AEROBIC AND ANAEROBIC AEB=12CC ANA=8CC   Culture NO GROWTH 2 DAYS   Final   Report Status PENDING   Incomplete  CULTURE, BLOOD (ROUTINE X 2)     Status: None   Collection Time    06/30/13 11:50 AM      Result Value Range Status   Specimen Description BLOOD LEFT ANTECUBITAL   Final   Special Requests BOTTLES DRAWN AEROBIC ONLY 11CC   Final   Culture NO GROWTH 2 DAYS   Final   Report Status PENDING   Incomplete  MRSA PCR SCREENING     Status: None   Collection Time    06/30/13  2:22 PM       Result Value Range Status   MRSA by PCR NEGATIVE  NEGATIVE Final   Comment:            The GeneXpert MRSA Assay (FDA     approved for NASAL specimens     only), is one component of a     comprehensive MRSA colonization     surveillance program. It is not     intended to diagnose MRSA     infection nor to guide or     monitor treatment for     MRSA infections.  CULTURE, EXPECTORATED SPUTUM-ASSESSMENT     Status: None   Collection Time    07/02/13  2:50 PM      Result Value Range Status   Specimen Description SPUTUM EXPECTORATED   Final   Special Requests NONE   Final   Sputum evaluation     Final   Value: THIS SPECIMEN IS ACCEPTABLE. RESPIRATORY CULTURE REPORT TO FOLLOW.     Performed at [facility]   Report Status 07/02/2013 FINAL   Final   Studies/Results: No results found.  Medications:  Prior to Admission:  Prescriptions prior to admission  Medication Sig Dispense Refill  . albuterol (PROVENTIL HFA;VENTOLIN HFA) 108 (90 BASE) MCG/ACT inhaler Inhale 2 puffs into the lungs every 6 (six) hours as needed. For shortness of breath       . albuterol (PROVENTIL) (2.5 MG/3ML) 0.083% nebulizer solution Take 2.5 mg by nebulization 3 (three) times daily as needed for wheezing or shortness of breath.       . busPIRone (BUSPAR) 5 MG tablet Take 1 tablet (5 mg total) by mouth 3 (three) times daily.  90 tablet  5  . cyclobenzaprine (FLEXERIL) 10 MG tablet Take 10 mg by mouth 2 (two) times daily.       Marland Kitchen Dextromethorphan-Guaifenesin (MUCINEX DM) 30-600 MG TB12 Take 1 tablet by mouth daily.      . Fluticasone Furoate-Vilanterol (BREO ELLIPTA) 100-25 MCG/INH AEPB Inhale 1 puff into the lungs daily.       . furosemide (LASIX) 40 MG tablet Take 40 mg by mouth daily as needed for fluid.       Marland Kitchen gabapentin (NEURONTIN) 300 MG capsule Take 300 mg by mouth at bedtime.      Marland Kitchen HYDROcodone-acetaminophen (NORCO/VICODIN) 5-325 MG per tablet Take 1 tablet by mouth every 6 (six) hours as needed  for pain.      . IRON PO Take 1 tablet by mouth daily.      . Milnacipran (SAVELLA) 50 MG TABS Take 50 mg by mouth 2 (two) times daily.       . predniSONE (DELTASONE) 10  MG tablet Take 1 tablet (10 mg total) by mouth daily.  100 tablet  5  . roflumilast (DALIRESP) 500 MCG TABS tablet Take 1 tablet (500 mcg total) by mouth daily.  30 tablet  12  . tadalafil (CIALIS) 5 MG tablet Take 5 mg by mouth daily as needed for erectile dysfunction.       Marland Kitchen tiotropium (SPIRIVA) 18 MCG inhalation capsule Place 18 mcg into inhaler and inhale daily.         Scheduled: . ipratropium  0.5 mg Nebulization Q6H   And  . albuterol  2.5 mg Nebulization Q6H  . antiseptic oral rinse  15 mL Mouth Rinse q12n4p  . busPIRone  5 mg Oral TID  . ceFEPime (MAXIPIME) IV  1 g Intravenous Q8H  . chlorhexidine  15 mL Mouth Rinse BID  . cyclobenzaprine  10 mg Oral BID  . enoxaparin (LOVENOX) injection  40 mg Subcutaneous Q24H  . gabapentin  300 mg Oral QHS  . methylPREDNISolone (SOLU-MEDROL) injection  40 mg Intravenous Q6H  . Milnacipran  50 mg Oral BID  . mometasone-formoterol  2 puff Inhalation BID  . polyethylene glycol  17 g Oral Daily  . roflumilast  500 mcg Oral Daily  . sodium chloride  3 mL Intravenous Q12H  . sodium chloride  3 mL Intravenous Q12H  . vancomycin  1,000 mg Intravenous Q12H   Continuous:  ZOX:WRUEAV chloride, acetaminophen, acetaminophen, albuterol, HYDROcodone-acetaminophen, ondansetron (ZOFRAN) IV, ondansetron, sodium chloride  Assesment: He was admitted with acute on chronic respiratory failure healthcare associated pneumonia. He has severe end-stage COPD and has been referred for potential lung transplant. He continues to have episodes of hypoxia despite supplemental oxygen and BiPAP Principal Problem:   Acute-on-chronic respiratory failure Active Problems:   HCAP (healthcare-associated pneumonia)   COPD (chronic obstructive pulmonary disease)   Respiratory acidosis    Plan: I'm going  to keep him in the intensive care unit for now. No other new treatments except for getting up and moving more.    LOS: 3 days   Edwardine Deschepper L 07/03/2013, 7:48 AM

## 2013-07-04 LAB — LEGIONELLA ANTIGEN, URINE

## 2013-07-04 MED ORDER — PREDNISONE (PAK) 10 MG PO TABS: 10.0000 mg | ORAL_TABLET | Freq: Four times a day (QID) | ORAL | Status: DC

## 2013-07-04 MED ORDER — LEVOFLOXACIN 500 MG PO TABS: 500.0000 mg | ORAL_TABLET | Freq: Every day | ORAL | Status: DC

## 2013-07-04 MED ORDER — VANCOMYCIN HCL 10 G IV SOLR
1250.0000 mg | Freq: Two times a day (BID) | INTRAVENOUS | Status: DC
  Filled 2013-07-04 (×4): qty 1250

## 2013-07-04 NOTE — Progress Notes (Signed)
Home Health order faxed to [facility] .  Patient previously patient with [facility].

## 2013-07-04 NOTE — Progress Notes (Signed)
CONTACT PT FOR OPV IN DEC OR JAN E30 DYSPHAGIA.

## 2013-07-04 NOTE — Progress Notes (Signed)
1400 - AVS reviewed with patient.  Patient and patient's wife verbalized understanding of discharge instructions, medications and follow-up appointment.  Teachback method used.  Pt's IV removed.  Site WNL.  [facility] to follow patient at home.  RN contacted [facility].  [facility] verified that they had received the patient's referral order and was reviewing the information.  Pt's IV removed.  Site WNL.  Pt's wife brought portable oxygen tank from home for transport home.  Pt transported by NT via w/c to main entrance for discharge.

## 2013-07-04 NOTE — Progress Notes (Signed)
ANTIBIOTIC CONSULT NOTE  Pharmacy Consult for Vancomycin and Cefepime Indication: pneumonia  Allergies  Allergen Reactions  . Morphine And Related Other (See Comments)    Paralysis, "shuts down kidney and bowel tract"   . Other     All narcotics cause paralysis, and shuts down kidney and bowel tract     Patient Measurements: Height: 5\' 11"  (180.3 cm) Weight: 167 lb 15.9 oz (76.2 kg) IBW/kg (Calculated) : 75.3  Vital Signs: Temp: 98.1 F (36.7 C) (10/25 0730) Temp src: Oral (10/25 0730) BP: 156/91 mmHg (10/25 0400) Pulse Rate: 91 (10/25 0400) Intake/Output from previous day: 10/24 0701 - 10/25 0700 In: 2570 [P.O.:1920; IV Piggyback:650] Out: 2175 [Urine:2175] Intake/Output from this shift: Total I/O In: -  Out: 200 [Urine:200]  Labs: No results found for this basename: WBC, HGB, PLT, LABCREA, CREATININE,  in the last 72 hours Estimated Creatinine Clearance: 83.2 ml/min (by C-G formula based on Cr of 0.98).  Recent Labs  07/03/13 1942  VANCOTROUGH 11.9     Microbiology: Recent Results (from the past 720 hour(s))  CULTURE, BLOOD (ROUTINE X 2)     Status: None   Collection Time    06/30/13 11:43 AM      Result Value Range Status   Specimen Description BLOOD RIGHT ANTECUBITAL   Final   Special Requests     Final   Value: BOTTLES DRAWN AEROBIC AND ANAEROBIC AEB=12CC ANA=8CC   Culture NO GROWTH 3 DAYS   Final   Report Status PENDING   Incomplete  CULTURE, BLOOD (ROUTINE X 2)     Status: None   Collection Time    06/30/13 11:50 AM      Result Value Range Status   Specimen Description BLOOD LEFT ANTECUBITAL   Final   Special Requests BOTTLES DRAWN AEROBIC ONLY 11CC   Final   Culture NO GROWTH 3 DAYS   Final   Report Status PENDING   Incomplete  MRSA PCR SCREENING     Status: None   Collection Time    06/30/13  2:22 PM      Result Value Range Status   MRSA by PCR NEGATIVE  NEGATIVE Final   Comment:            The GeneXpert MRSA Assay (FDA     approved for  NASAL specimens     only), is one component of a     comprehensive MRSA colonization     surveillance program. It is not     intended to diagnose MRSA     infection nor to guide or     monitor treatment for     MRSA infections.  CULTURE, EXPECTORATED SPUTUM-ASSESSMENT     Status: None   Collection Time    07/02/13  2:50 PM      Result Value Range Status   Specimen Description SPUTUM EXPECTORATED   Final   Special Requests NONE   Final   Sputum evaluation     Final   Value: THIS SPECIMEN IS ACCEPTABLE. RESPIRATORY CULTURE REPORT TO FOLLOW.     Performed at [facility]   Report Status 07/02/2013 FINAL   Final  CULTURE, RESPIRATORY (NON-EXPECTORATED)     Status: None   Collection Time    07/02/13  2:50 PM      Result Value Range Status   Specimen Description SPUTUM EXPECTORATED   Final   Special Requests COPD HCAP   Final   Gram Stain     Final  Value: FEW WBC PRESENT, PREDOMINANTLY PMN     MODERATE SQUAMOUS EPITHELIAL CELLS PRESENT     RARE GRAM POSITIVE RODS     RARE GRAM POSITIVE COCCI     IN PAIRS   Culture     Final   Value: FEW CANDIDA ALBICANS     Performed at [facility]   Report Status PENDING   Incomplete   Medical History: Past Medical History  Diagnosis Date  . COPD (chronic obstructive pulmonary disease)   . Fibromyalgia   . Shingles   . Cancer     kidney  . Helicobacter pylori gastritis March 2014  . Candida esophagitis March 2014  . On supplemental oxygen therapy     3L/min and on BPAP @ [facility]  . Hypertension     "he doesnt take medication anymore because no longer has high blood pressure" per family   Medications:  Scheduled:  . ipratropium  0.5 mg Nebulization Q6H   And  . albuterol  2.5 mg Nebulization Q6H  . antiseptic oral rinse  15 mL Mouth Rinse q12n4p  . busPIRone  5 mg Oral TID  . ceFEPime (MAXIPIME) IV  1 g Intravenous Q8H  . chlorhexidine  15 mL Mouth Rinse BID  . cyclobenzaprine  10 mg Oral BID  . enoxaparin (LOVENOX)  injection  40 mg Subcutaneous Q24H  . gabapentin  300 mg Oral QHS  . methylPREDNISolone (SOLU-MEDROL) injection  40 mg Intravenous Q6H  . Milnacipran  50 mg Oral BID  . mometasone-formoterol  2 puff Inhalation BID  . polyethylene glycol  17 g Oral Daily  . roflumilast  500 mcg Oral Daily  . sodium chloride  3 mL Intravenous Q12H  . sodium chloride  3 mL Intravenous Q12H  . vancomycin  1,000 mg Intravenous Q12H   Assessment: 62yo male admitted with pneumonia and acute on chronic respiratory failure.  Pt has good renal fxn.  Estimated Creatinine Clearance: 83.2 ml/min (by C-G formula based on Cr of 0.98).  Pt was on Zithromax and Rocephin but was switched to Vancomycin and Cefepime.  Trough below goal.  Zithromax 10/21 >> 10/23 Rocephin 10/21 >> 10/23 Vancomycin 10/23 >> Cefepime 10/23 >>  Goal of Therapy:  Vancomycin trough level 15-20 mcg/ml  Plan:  Continue Cefepime 1gm IV q8hrs Increase Vancomycin 1250 mg IV q12hrs Repeat trough at steady state Monitor labs, renal fxn, and cultures  Mady Gemma 07/04/2013,11:33 AM

## 2013-07-04 NOTE — Progress Notes (Signed)
REVIEWED.  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

## 2013-07-05 LAB — CULTURE, BLOOD (ROUTINE X 2)

## 2013-07-05 LAB — CULTURE, RESPIRATORY W GRAM STAIN

## 2013-07-05 NOTE — Discharge Summary (Signed)
NAMEARLIE, Connor Armstrong             ACCOUNT NO.:  [id number]  MEDICAL RECORD NO.:  [id number]  LOCATION:  IC04                          FACILITY:  [facility]  PHYSICIAN:  Mila Homer. Sudie Bailey, M.D.DATE OF BIRTH:  [birth year]  DATE OF ADMISSION:  06/30/2013 DATE OF DISCHARGE:  LH                              DISCHARGE SUMMARY   This is a 62 year old was admitted to the hospital with COPD exacerbation.  He had a 5-day hospitalization extending from June 30, 2013, to July 04, 2013.  His admission white cell count was 17,600, and by the following day, this did drop to 12,500.  There were 92% neutrophils.  His chloride initially was 91, recheck 94.  His creatinine was normal.  Blood gases on admission showed a pH 7.331, pCO2 79, and pO2 122.  Glucoses were 124 and 154.  UA was negative for Legionella and strep pneumonia.  Blood cultures were negative as was MRSA.  The sputum culture was pending at the time of discharge.  There were few Candida albicans noted.  His chest x-ray showed hypoinflation and emphysematous changes as well as chronic fibrotic changes.  There was some haziness at the right perihilar area.  A 12-lead EKG showed a sinus tachycardia, rate of 120s, and possible anterior infarct.  There was no significant change compared with September 2014 EKG.  He was admitted to the hospital.  He was put on ipratropium, albuterol neb treatment, and given ceftriaxone 1 g IV, and azithromycin 5 mg IV. He was put on buspirone hydrocodone/APAP, gabapentin and cyclobenzaprine.  He received Savella for fibromyalgia.  He received methylprednisolone IV.  After a couple of days, he was put on vancomycin IV as well.  He was on Daliresp 500 mcg as well.  He did well on this regimen and was back to baseline by the 5th day.  I discussed the possibility of discharge with Dr. Juanetta Gosling, his attending physician, who recommended that he be discharged on levofloxacin and tapered prednisone, so  this was arranged.  DISCHARGE MEDICATIONS:  Albuterol inhaler and albuterol nebulizer solution, buspirone 5 mg t.i.d., cyclobenzaprine 10 mg b.i.d., __________ 30/600 tablets daily, fluticasone/vilanterol 100/25 puff daily, furosemide 40 mg daily, gabapentin 300 mg at bedtime, hydrocodone/APAP 5/325 q.6 hours p.r.n. pain, iron tablet daily, Savella 50 mg b.i.d., Daliresp 500 mcg daily, Cialis 5 mg daily, and Spiriva 18 mcg inhalation capsule inhale daily.  He was also started on levofloxacin to use 5 mg daily for 10-day course, and prednisone taper 10 mg 2 tablets b.i.d. for 3 days, 1 tablet b.i.d. for 3 days, 1 tab daily thereafter (30 with a refill).  He was to follow up with Dr. Juanetta Gosling in the near future.  FINAL DISCHARGE DIAGNOSES: 1. Chronic obstructive pulmonary disease exacerbation. 2. Healthcare-associated pneumonia. 3. Fibromyalgia. 4. Benign essential hypertension. 5. Anemia. 6. It was also noted that he has home O2 by oxygen concentrator at     home.     Mila Homer. Sudie Bailey, M.D.     SDK/MEDQ  D:  07/04/2013  T:  07/04/2013  Job:  [id number]

## 2013-07-07 NOTE — Progress Notes (Signed)
UR chart review completed.  

## 2013-07-16 ENCOUNTER — Other Ambulatory Visit: Payer: Self-pay

## 2013-08-31 ENCOUNTER — Encounter: Payer: Self-pay | Admitting: Gastroenterology

## 2013-12-06 IMAGING — CR DG CHEST 2V
2 series · 2 of 2 positions shown · non-contrast
Comparison: 05/19/2013

CLINICAL DATA: Fever, shortness of breath

EXAM:
CHEST  2 VIEW

[view not recorded (1 of 2)]
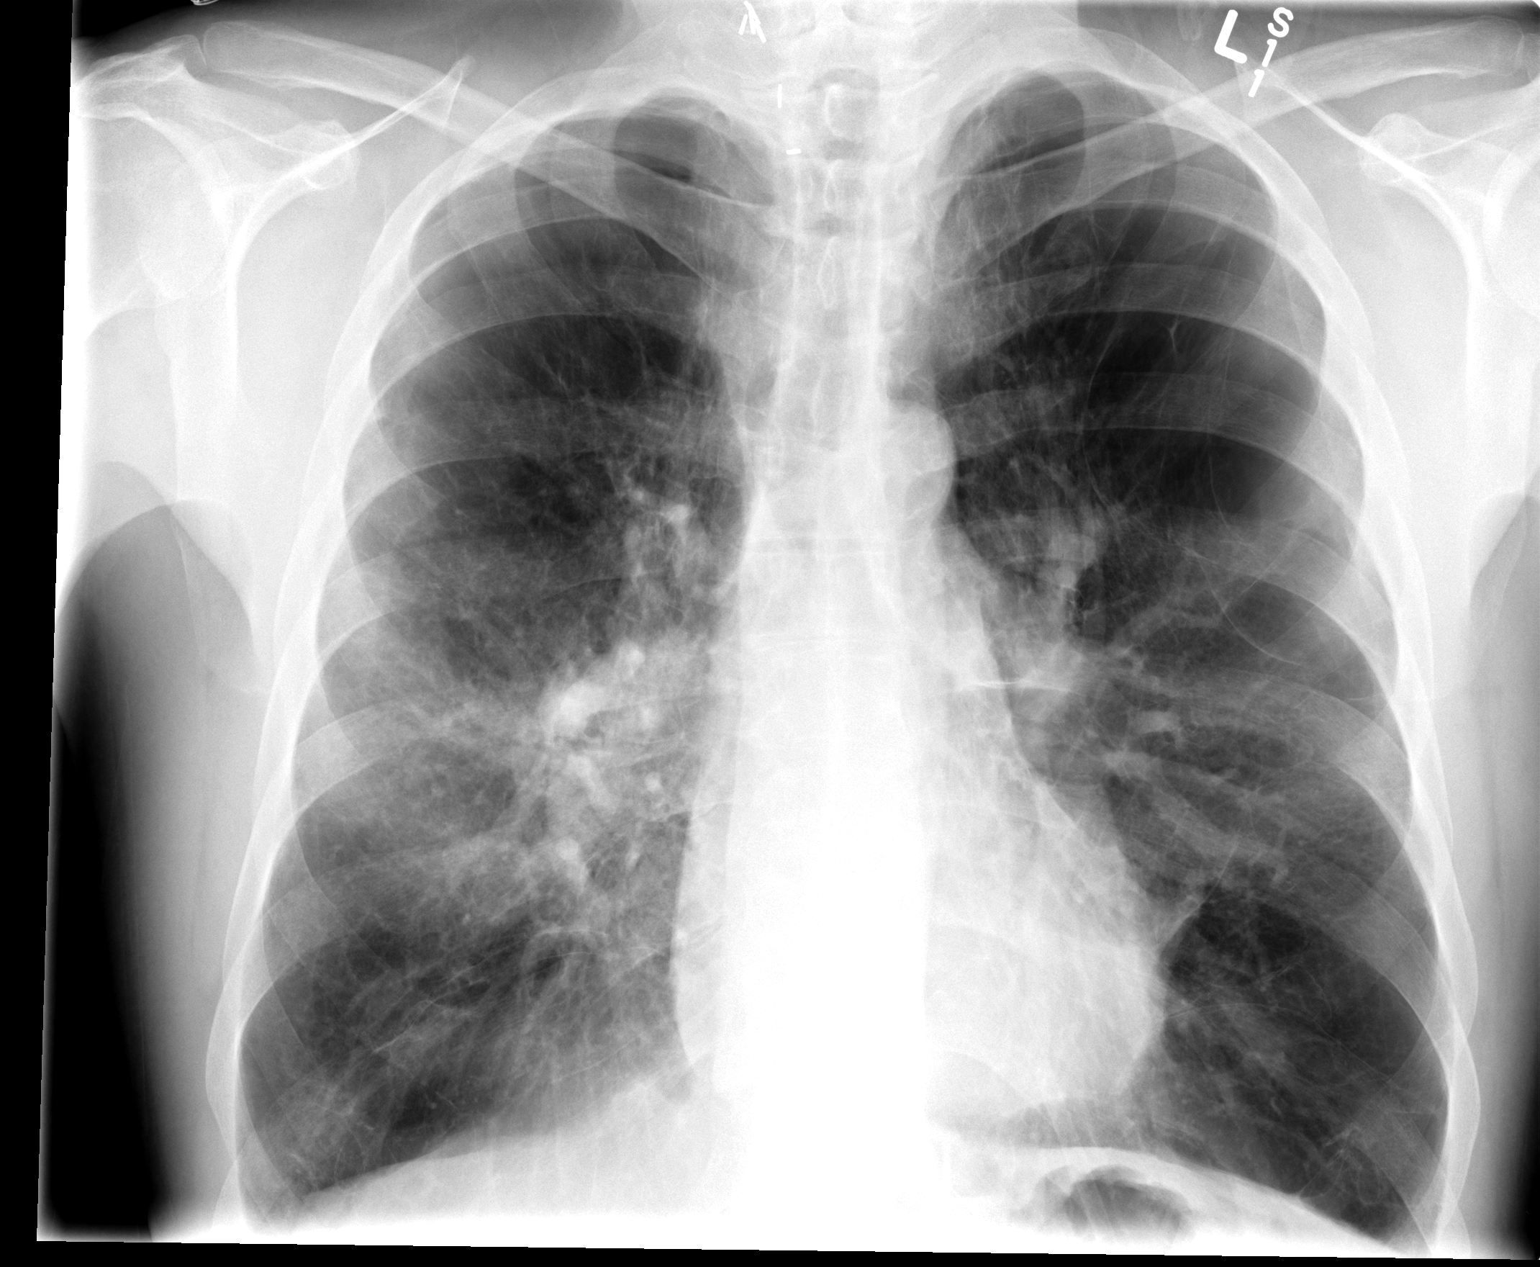

[view not recorded (2 of 2)]
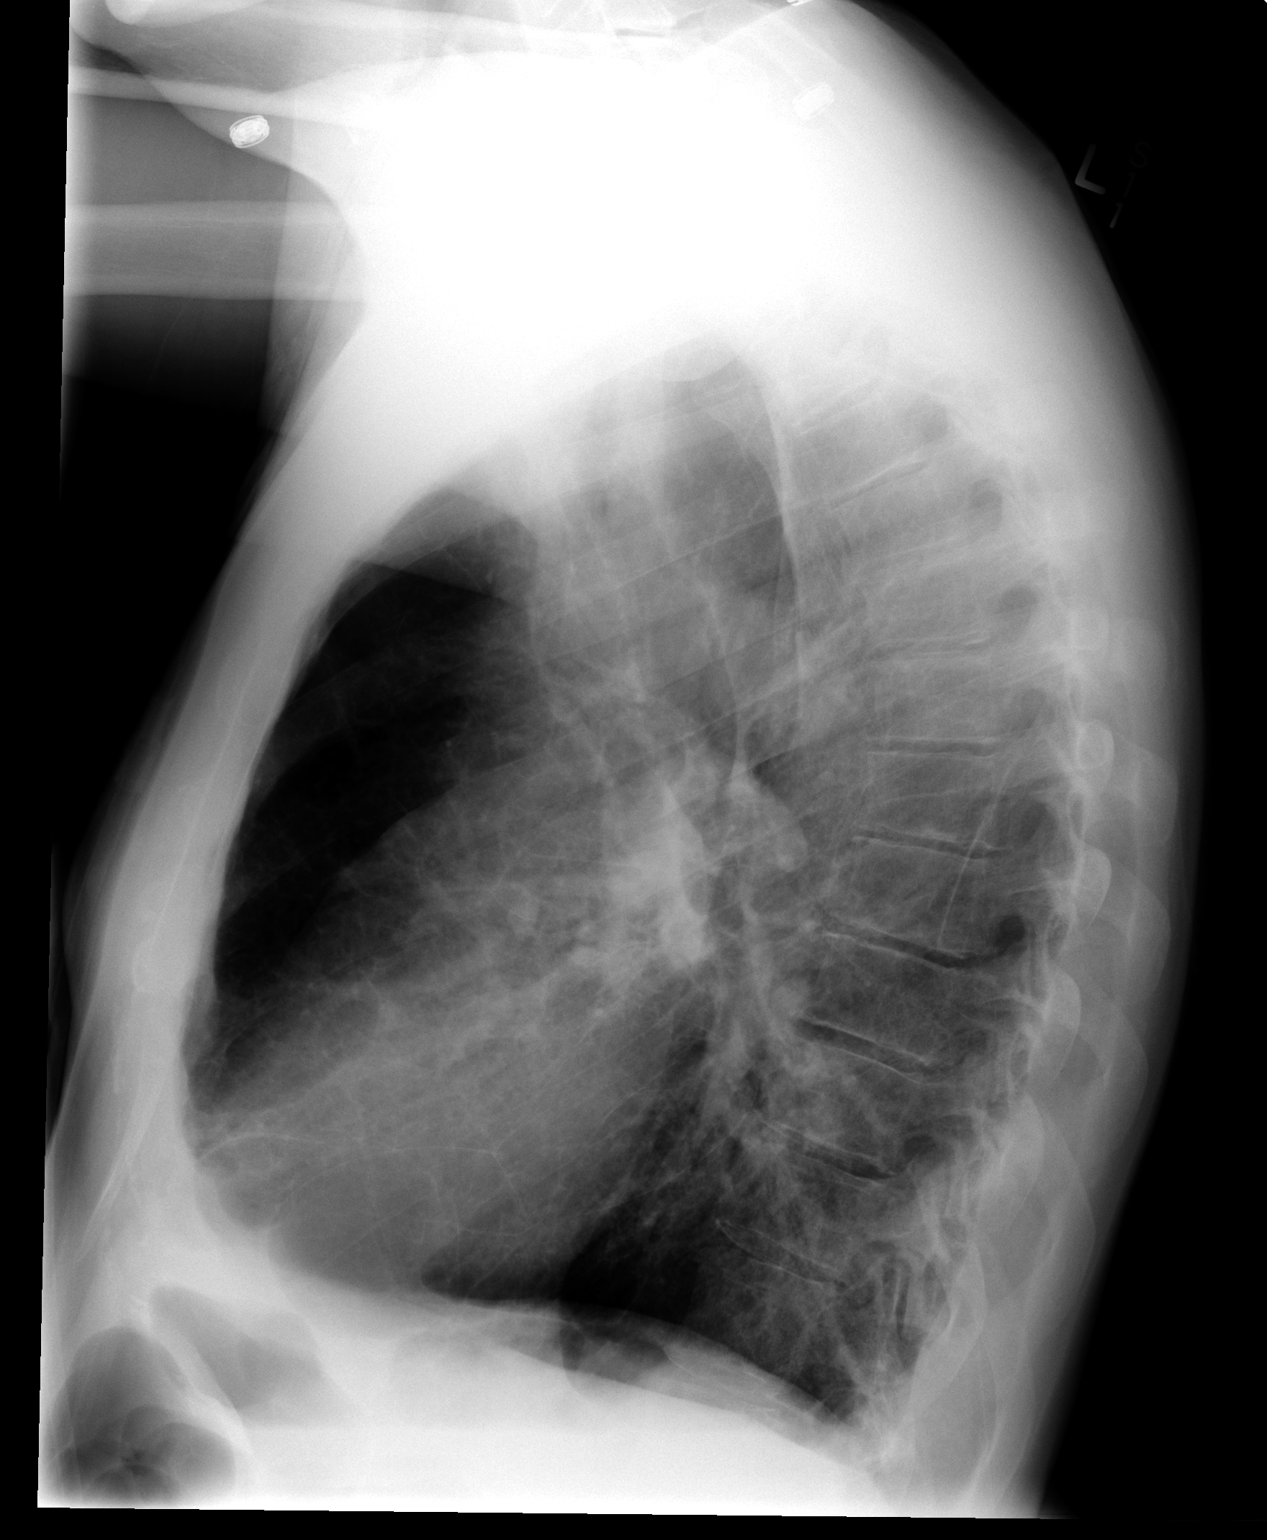

[2 of 2 positions shown; findings below may reference images not displayed]

FINDINGS: Cardiomediastinal silhouette is stable. Hyperinflation emphysematous
changes and chronic fibrotic changes again noted. Persistent right
hilar prominence. There is somewhat asymmetric haziness in right
perihilar region. I cannot exclude superimposed evolving infiltrate.
Clinical correlation is necessary. Follow-up to resolution is
recommended.
IMPRESSION: Hyperinflation emphysematous changes and chronic fibrotic changes
again noted. Persistent right hilar prominence. There is somewhat
asymmetric haziness in right perihilar region. I cannot exclude
superimposed evolving infiltrate. Clinical correlation is necessary.
Follow-up to resolution is recommended.

## 2013-12-31 ENCOUNTER — Encounter (HOSPITAL_COMMUNITY): Payer: Self-pay

## 2013-12-31 ENCOUNTER — Encounter (HOSPITAL_COMMUNITY)
Admission: RE | Admit: 2013-12-31 | Discharge: 2013-12-31 | Disposition: A | Discharge status: Home / Self Care | Payer: Medicare Other | Source: Ambulatory Visit | Attending: Pulmonary Disease | Admitting: Pulmonary Disease

## 2013-12-31 VITALS — BP 132/78 | HR 96 | Ht 73.0 in | Wt 177.0 lb

## 2013-12-31 DIAGNOSIS — J438 Other emphysema: Secondary | ICD-10-CM

## 2013-12-31 NOTE — Progress Notes (Addendum)
Patient referred to Connor Armstrong due to Emphysema 492.8. During orientation advised patient on arrival and appointment times what to wear, what to do before, during and after exercise. Reviewed attendance and class policy. Talked about inclement weather and class consultation policy. Pt is scheduled to start Pulmonary Rehab on 01/04/14 at 1 pm. Pt was advised to come to class 5 minutes before class starts. He was also given instructions on meeting with the dietician and attending the Family Structure classes. Pt is eager to get started. Patient able to do 6 minute walk test. Talked to patient about pursed lip breathing while walking or any exertion.

## 2013-12-31 NOTE — Patient Instructions (Signed)
Pt has finished orientation and is scheduled to start PR on 01/04/14 at 1 pm. Pt has been instructed to arrive to class 15 minutes early for scheduled class. Pt has been instructed to wear comfortable clothing and shoes with rubber soles. Pt has been told to take their medications 1 hour prior to coming to class.  If the patient is not going to attend class, he/she has been instructed to call.

## 2014-01-04 ENCOUNTER — Encounter (HOSPITAL_COMMUNITY)
Admission: RE | Admit: 2014-01-04 | Discharge: 2014-01-04 | Disposition: A | Discharge status: Home / Self Care | Payer: Medicare Other | Source: Ambulatory Visit | Attending: Pulmonary Disease | Admitting: Pulmonary Disease

## 2014-01-06 ENCOUNTER — Encounter (HOSPITAL_COMMUNITY)
Admission: RE | Admit: 2014-01-06 | Discharge: 2014-01-06 | Disposition: A | Discharge status: Home / Self Care | Payer: Medicare Other | Source: Ambulatory Visit | Attending: Pulmonary Disease | Admitting: Pulmonary Disease

## 2014-01-11 ENCOUNTER — Encounter (HOSPITAL_COMMUNITY): Payer: Medicare Other

## 2014-01-13 ENCOUNTER — Encounter (HOSPITAL_COMMUNITY): Payer: Medicare Other

## 2014-01-18 ENCOUNTER — Encounter (HOSPITAL_COMMUNITY): Payer: Medicare Other

## 2014-01-20 ENCOUNTER — Encounter (HOSPITAL_COMMUNITY)
Admission: RE | Admit: 2014-01-20 | Discharge: 2014-01-20 | Disposition: A | Discharge status: Home / Self Care | Payer: Medicare Other | Source: Ambulatory Visit | Attending: Pulmonary Disease | Admitting: Pulmonary Disease

## 2014-01-20 DIAGNOSIS — J438 Other emphysema: Secondary | ICD-10-CM | POA: Insufficient documentation

## 2014-01-22 ENCOUNTER — Ambulatory Visit (HOSPITAL_COMMUNITY): Payer: Medicare Other

## 2014-01-25 ENCOUNTER — Encounter (HOSPITAL_COMMUNITY)
Admission: RE | Admit: 2014-01-25 | Discharge: 2014-01-25 | Disposition: A | Discharge status: Home / Self Care | Payer: Medicare Other | Source: Ambulatory Visit | Attending: Pulmonary Disease | Admitting: Pulmonary Disease

## 2014-01-27 ENCOUNTER — Encounter (HOSPITAL_COMMUNITY)
Admission: RE | Admit: 2014-01-27 | Discharge: 2014-01-27 | Disposition: A | Discharge status: Home / Self Care | Payer: Medicare Other | Source: Ambulatory Visit | Attending: Pulmonary Disease | Admitting: Pulmonary Disease

## 2014-02-01 ENCOUNTER — Encounter (HOSPITAL_COMMUNITY): Payer: Medicare Other

## 2014-02-03 ENCOUNTER — Encounter (HOSPITAL_COMMUNITY)
Admission: RE | Admit: 2014-02-03 | Discharge: 2014-02-03 | Disposition: A | Discharge status: Home / Self Care | Payer: Medicare Other | Source: Ambulatory Visit | Attending: Pulmonary Disease | Admitting: Pulmonary Disease

## 2014-02-08 ENCOUNTER — Encounter (HOSPITAL_COMMUNITY)
Admission: RE | Admit: 2014-02-08 | Discharge: 2014-02-08 | Disposition: A | Discharge status: Home / Self Care | Payer: Medicare Other | Source: Ambulatory Visit | Attending: Pulmonary Disease | Admitting: Pulmonary Disease

## 2014-02-08 DIAGNOSIS — J438 Other emphysema: Secondary | ICD-10-CM | POA: Insufficient documentation

## 2014-02-10 ENCOUNTER — Encounter (HOSPITAL_COMMUNITY)
Admission: RE | Admit: 2014-02-10 | Discharge: 2014-02-10 | Disposition: A | Discharge status: Home / Self Care | Payer: Medicare Other | Source: Ambulatory Visit | Attending: Pulmonary Disease | Admitting: Pulmonary Disease

## 2014-02-10 DIAGNOSIS — J438 Other emphysema: Secondary | ICD-10-CM | POA: Diagnosis not present

## 2014-02-15 ENCOUNTER — Encounter (HOSPITAL_COMMUNITY)
Admission: RE | Admit: 2014-02-15 | Discharge: 2014-02-15 | Disposition: A | Discharge status: Home / Self Care | Payer: Medicare Other | Source: Ambulatory Visit | Attending: Pulmonary Disease | Admitting: Pulmonary Disease

## 2014-02-15 ENCOUNTER — Ambulatory Visit (INDEPENDENT_AMBULATORY_CARE_PROVIDER_SITE_OTHER): Payer: No Typology Code available for payment source | Admitting: Psychology

## 2014-02-15 DIAGNOSIS — J438 Other emphysema: Secondary | ICD-10-CM | POA: Diagnosis not present

## 2014-02-15 DIAGNOSIS — F418 Other specified anxiety disorders: Secondary | ICD-10-CM

## 2014-02-15 DIAGNOSIS — F411 Generalized anxiety disorder: Secondary | ICD-10-CM

## 2014-02-17 ENCOUNTER — Encounter (HOSPITAL_COMMUNITY)
Admission: RE | Admit: 2014-02-17 | Discharge: 2014-02-17 | Disposition: A | Discharge status: Home / Self Care | Payer: Medicare Other | Source: Ambulatory Visit | Attending: Pulmonary Disease | Admitting: Pulmonary Disease

## 2014-02-17 DIAGNOSIS — J438 Other emphysema: Secondary | ICD-10-CM | POA: Diagnosis not present

## 2014-02-22 ENCOUNTER — Encounter (HOSPITAL_COMMUNITY)
Admission: RE | Admit: 2014-02-22 | Discharge: 2014-02-22 | Disposition: A | Discharge status: Home / Self Care | Payer: Medicare Other | Source: Ambulatory Visit | Attending: Pulmonary Disease | Admitting: Pulmonary Disease

## 2014-02-22 DIAGNOSIS — J438 Other emphysema: Secondary | ICD-10-CM | POA: Diagnosis not present

## 2014-02-24 ENCOUNTER — Encounter (HOSPITAL_COMMUNITY)
Admission: RE | Admit: 2014-02-24 | Discharge: 2014-02-24 | Disposition: A | Discharge status: Home / Self Care | Payer: Medicare Other | Source: Ambulatory Visit | Attending: Pulmonary Disease | Admitting: Pulmonary Disease

## 2014-02-24 DIAGNOSIS — J438 Other emphysema: Secondary | ICD-10-CM | POA: Diagnosis not present

## 2014-03-01 ENCOUNTER — Encounter (HOSPITAL_COMMUNITY): Payer: Medicare Other

## 2014-03-03 ENCOUNTER — Encounter (HOSPITAL_COMMUNITY): Payer: Medicare Other

## 2014-03-08 ENCOUNTER — Encounter (HOSPITAL_COMMUNITY): Payer: Self-pay | Admitting: Psychology

## 2014-03-08 ENCOUNTER — Encounter (HOSPITAL_COMMUNITY): Payer: Medicare Other

## 2014-03-08 ENCOUNTER — Ambulatory Visit (INDEPENDENT_AMBULATORY_CARE_PROVIDER_SITE_OTHER): Payer: No Typology Code available for payment source | Admitting: Psychology

## 2014-03-08 DIAGNOSIS — F411 Generalized anxiety disorder: Secondary | ICD-10-CM

## 2014-03-08 DIAGNOSIS — F418 Other specified anxiety disorders: Secondary | ICD-10-CM

## 2014-03-08 NOTE — Progress Notes (Signed)
Patient:   Connor Armstrong   DOB:   09-26-1950  MR Number:  [id number]  Location:  [facility] [address] [place] [address] Dept: [phone]           Date of Service:   02/15/2014  Start Time:   10 AM End Time:   11 AM  Provider/Observer:  Edgardo Roys PSYD       Billing Code/Service: [phone]  Chief Complaint:     Chief Complaint  Patient presents with  . Anxiety  . Stress    Reason for Service:  The patient was referred by [facility] transplant center for psychological evaluation prior to lung transplant surgery. The patient reports that they have requested evaluation for anxiety and/or depression as part of the pulmonary evaluation for consideration of lung transplant. The patient doesn't knowledge some levels of moderate anxiety on days that he has significant shortness of breath with very little exertion. The patient described a history of his pulmonary deterioration. The patient reports that he has not worked since 2010. He reports that last year he was in the hospital on 5 occasions do to chronic obstructive pulmonary disease and emphysema. The patient reports that he was in the hospital 4 times the previous year. The patient reports that he was diagnosed with end-stage COPD and emphysema. The patient reports that he has a history of many decades of smoking as well as working in an iron mill and other factor/mill settings. The patient reports that he is on oxygen 24/7. The patient reports that his major stress has to do with not being able to work. He reports that he has no prior history of depression or anxiety. The patient reports that he does have good family support.   Current Status:  The patient describes significant breathing difficulties with the diagnosis of end-stage COPD and emphysema. The patient has a 40 year plus history of smoking as well as working  in settings that may have caused problems with his breathing.  He describes a lack of prior history or symptoms of anxiety or depression and reports that most of his anxiety his around his health concerns.  Reliability of Information: The information is provided directly by the patient as well as review of available medical records.  Behavioral Observation: Connor Armstrong  presents as a 63 y.o.-year-old Right Caucasian Male who appeared his stated age. his dress was Appropriate and he was Well Groomed and his manners were Appropriate to the situation.  There were some difficulties with breathing noted. No other physical difficulties were noted.  he displayed an appropriate level of cooperation and motivation.    Interactions:    Active   Attention:   within normal limits  Memory:   within normal limits  Visuo-spatial:   within normal limits  Speech (Volume):  normal  Speech:   normal pitch  Thought Process:  Coherent  Though Content:  WNL  Orientation:   person, place, time/date and situation  Judgment:   Good  Planning:   Good  Affect:    No indications during the clinical interview of significant anxiety or depression symptoms. He didn't knowledge worrying about his health status but nothing beyond that.  Mood:    No significant observe symptoms of anxiety or depression. His mood was appropriate to the situation.  Insight:   Good  Intelligence:   normal  Marital Status/Living: The patient was  born and raised in [place]. He is married to his wife of 49 years and has 4 children. He currently lives with his wife and his 47 year old granddaughter.  Current Employment: The patient is not working.  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  Substance Use:  No concerns of substance abuse are reported.  the patient doesn't knowledge history of alcohol abuse many years ago. However, he reports that he has been free of any alcohol use for many decades. He denies any other substance use and his history.  Education:   The patient reports that he took some college courses and technical school. He has a degree as an EMT.  Medical History:   Past Medical History  Diagnosis Date  . COPD (chronic obstructive pulmonary disease)   . Fibromyalgia   . Shingles   . Cancer     kidney  . Helicobacter pylori gastritis March 2014  . Candida esophagitis March 2014  . On supplemental oxygen therapy     3L/min and on BPAP @ HS  . Hypertension     "he doesnt take medication anymore because no longer has high blood pressure" per family        Outpatient Encounter Prescriptions as of 02/15/2014  Medication Sig  . albuterol (PROVENTIL HFA;VENTOLIN HFA) 108 (90 BASE) MCG/ACT inhaler Inhale 2 puffs into the lungs every 6 (six) hours as needed. For shortness of breath   . albuterol (PROVENTIL) (2.5 MG/3ML) 0.083% nebulizer solution Take 2.5 mg by nebulization 3 (three) times daily as needed for wheezing or shortness of breath.   . busPIRone (BUSPAR) 5 MG tablet Take 1 tablet (5 mg total) by mouth 3 (three) times daily.  . cyclobenzaprine (FLEXERIL) 10 MG tablet Take 10 mg by mouth 2 (two) times daily.   Marland Kitchen Dextromethorphan-Guaifenesin (MUCINEX DM) 30-600 MG TB12 Take 1 tablet by mouth daily.  . Fluticasone Furoate-Vilanterol (BREO ELLIPTA) 100-25 MCG/INH AEPB Inhale 1 puff into the lungs daily.   . furosemide (LASIX) 40 MG tablet Take 40 mg by mouth daily as needed for fluid.   Marland Kitchen gabapentin (NEURONTIN) 300 MG capsule Take 300 mg by mouth at bedtime.  Marland Kitchen HYDROcodone-acetaminophen (NORCO/VICODIN) 5-325 MG per tablet Take 1 tablet by mouth every 6 (six) hours as needed for pain.  . IRON PO Take 1 tablet by mouth daily.  Marland Kitchen  levofloxacin (LEVAQUIN) 500 MG tablet Take 1 tablet (500 mg total) by mouth daily.  . Milnacipran (SAVELLA) 50 MG TABS Take 50 mg by mouth 2 (two) times daily.   . predniSONE (STERAPRED UNI-PAK) 10 MG tablet Take 1 tablet (10 mg total) by mouth taper from 4 doses each day to 1 dose and stop. Take 2 tablets twice a day for 3 days, 1 tablet twice a day for 3 days, and one tablet once a day thereafter.  . roflumilast (DALIRESP) 500 MCG TABS tablet Take 1 tablet (500 mcg total) by mouth daily.  . tadalafil (CIALIS) 5 MG tablet Take 5 mg by mouth daily as needed for erectile dysfunction.   Marland Kitchen tiotropium (SPIRIVA) 18 MCG inhalation capsule Place 18 mcg into inhaler and inhale daily.            Sexual History:  History  Sexual Activity  . Sexual Activity: Not Currently    Abuse/Trauma History: The patient denies any history of abuse or trauma.  Psychiatric History:  The patient denies any prior psychiatric history.  Family Med/Psych History:  Family History  Problem Relation Age of Onset  . Hypertension Mother   . Colon cancer Paternal Grandmother     Risk of Suicide/Violence: virtually non-existent the patient denies any suicidal or homicidal ideation.  Impression/DX:  The patient does have a history of some anxiety associated with times of significant issues with breathing and maintaining oxygen levels. He reports that the anxiety and depressive symptoms that he experiences are all associated with his concern about his medical status. He has no prior history of anxiety or depression. The patient is on oxygen 24 7 and has been diagnosed with end-stage COPD and emphysema. This evaluation is part of an assessment to determine his appropriate status for a lung transplant.  Disposition/Plan:  We will have the patient complete a [place] multiphasic personality inventory-II to obtain objective features regarding any potential underlying psychiatric or psychological issues such his anxiety and  depression.  Diagnosis:    Axis I:  Anxiety about health      Axis II: No diagnosis         Ilean Skill, Psy.D. Clinical Psychologist Neuropsychologist  02/15/2014

## 2014-03-10 ENCOUNTER — Encounter (HOSPITAL_COMMUNITY)
Admission: RE | Admit: 2014-03-10 | Discharge: 2014-03-10 | Disposition: A | Discharge status: Home / Self Care | Payer: Medicare Other | Source: Ambulatory Visit | Attending: Pulmonary Disease | Admitting: Pulmonary Disease

## 2014-03-10 DIAGNOSIS — J438 Other emphysema: Secondary | ICD-10-CM | POA: Insufficient documentation

## 2014-03-11 ENCOUNTER — Encounter (HOSPITAL_COMMUNITY): Payer: Self-pay | Admitting: Psychology

## 2014-03-11 NOTE — Progress Notes (Signed)
Today with a follow up appointment with this patient regarding a psychological evaluation was requested by [facility] transplant team. As he was previously and will be attached to this report. As pertains to specific background information. The following is a review of objective psychological testing utilizing the multiphasic personality inventory-II (MMPI-II).  The patient produced what appears to be a generally valid profile suggesting that he neither made excessive efforts to maximize and exaggerate or minimize any underlying psychological or psychiatric issues. There is some indications that he tended to minimize to some degree difficulties that he is having and he admitted to some apprehension about taking this measure for fear that it would find something or reports something in accurate that would jeopardize his ability to receive a lung transplant. We reviewed this issue and the results from the clinical scale are generally consistent with those  impression is that were dry throughout the 2 separate hour-long clinical interview this.  As far as basic clinical scales the person does display an elevation symptoms associated with significant concern over his general health functioning. He identified both vague and specific health concerns and worries about how he is functioning. This is very consistent with clinical information is the patient is clearly suffering from significant medical complications from his pulmonary function. All other basic clinical scales were below critical levels and do not indicate any significant issues related to significant depression, anxiety, paranoia, manic symptoms, psychotic symptoms or other adjustment issues. In-depth analysis of these clinical scales do highlight isn't knowledge of physical difficulties and malfunction. He also identifies a number of somatic complaints and feelings of lack of energy to cope and manage with his situation. However, he does not  endorse any degree of significant symptoms consistent with major depression or significant underlying long-standing anxiety disorder. Content scales also highlight this focus on health concerns with a denial of issues of anxiety or symptoms consistent with the clinical depression. The patient is not endorse items consistent with the history of PTSD or other traumatic experiences and the patient endorses good family structure a supportive family situation. He denies any hallucinations or delusions and reports that he has and feels like he has to control of her assaults, feelings, and behaviors.  Summary and recommendations:  As far as objective features as well as clinical interview data, the patient does not appear to have any significant psychological and psychiatric conditions beyond what would be normally expected given his overall health status. He is clearly very concerned about his current health status and the imminent danger his health status as for him. He clearly understands the wide ranging risks and benefits from having this lung transplant if he is found to be a good candidate. The patient is fully competent to make informed consent and decisions about his healthcare and there were no indications of any intellectual or cognitive impairment. The patient does not show any objective findings of significant or moderate symptoms of depression or anxiety, although he does clearly acknowledge a great deal of appropriate concern about his health status and the dangers to his life because of his pulmonary function. In any event, from a psychological/psychiatric perspective, I do think this patient would be an appropriate candidate for lung transplant and there are no psychological issues that would put him at significant risk for bad outcomes relative to his ability to follow through with care following this procedure and he clearly has the intellectual and cognitive abilities to understand and make a clear  and valid  informed consent. The patient is not in need of any psychotropic medications at this point I do not think that he is particularly vulnerable to developing any significant issues related to a major depressive condition or a general anxiety condition.    The patient does have an appropriate and significant level of concern about his overall health functioning and the risks to his life as a result. I do think that the patient could benefit from some counseling following a lung transplant if he is found to be an appropriate candidate and the procedure is conducted. This has clearly been a very stressful and trying time for him but I do think that he has a psychological/psychiatric stability and functioning to be able to handle recurrent issues and the followup issues that will arise.

## 2014-03-14 ENCOUNTER — Emergency Department (HOSPITAL_COMMUNITY): Payer: Medicare Other

## 2014-03-14 ENCOUNTER — Encounter (HOSPITAL_COMMUNITY): Payer: Self-pay | Admitting: Emergency Medicine

## 2014-03-14 ENCOUNTER — Inpatient Hospital Stay (HOSPITAL_COMMUNITY)
Admission: EM | Admit: 2014-03-14 | Discharge: 2014-03-16 | DRG: 190 | Disposition: A | Discharge status: Home Health | Payer: Medicare Other | Attending: Pulmonary Disease | Admitting: Pulmonary Disease

## 2014-03-14 DIAGNOSIS — E872 Acidosis, unspecified: Secondary | ICD-10-CM | POA: Diagnosis present

## 2014-03-14 DIAGNOSIS — J9622 Acute and chronic respiratory failure with hypercapnia: Secondary | ICD-10-CM

## 2014-03-14 DIAGNOSIS — Z800 Family history of malignant neoplasm of digestive organs: Secondary | ICD-10-CM | POA: Diagnosis not present

## 2014-03-14 DIAGNOSIS — IMO0001 Reserved for inherently not codable concepts without codable children: Secondary | ICD-10-CM | POA: Diagnosis present

## 2014-03-14 DIAGNOSIS — Z9981 Dependence on supplemental oxygen: Secondary | ICD-10-CM

## 2014-03-14 DIAGNOSIS — I498 Other specified cardiac arrhythmias: Secondary | ICD-10-CM | POA: Diagnosis present

## 2014-03-14 DIAGNOSIS — Z85528 Personal history of other malignant neoplasm of kidney: Secondary | ICD-10-CM

## 2014-03-14 DIAGNOSIS — J441 Chronic obstructive pulmonary disease with (acute) exacerbation: Secondary | ICD-10-CM | POA: Diagnosis present

## 2014-03-14 DIAGNOSIS — J9620 Acute and chronic respiratory failure, unspecified whether with hypoxia or hypercapnia: Secondary | ICD-10-CM | POA: Diagnosis present

## 2014-03-14 DIAGNOSIS — Z8249 Family history of ischemic heart disease and other diseases of the circulatory system: Secondary | ICD-10-CM | POA: Diagnosis not present

## 2014-03-14 DIAGNOSIS — I10 Essential (primary) hypertension: Secondary | ICD-10-CM

## 2014-03-14 DIAGNOSIS — Z87891 Personal history of nicotine dependence: Secondary | ICD-10-CM

## 2014-03-14 DIAGNOSIS — IMO0002 Reserved for concepts with insufficient information to code with codable children: Secondary | ICD-10-CM

## 2014-03-14 DIAGNOSIS — Z905 Acquired absence of kidney: Secondary | ICD-10-CM | POA: Diagnosis not present

## 2014-03-14 DIAGNOSIS — R509 Fever, unspecified: Secondary | ICD-10-CM | POA: Diagnosis present

## 2014-03-14 DIAGNOSIS — M797 Fibromyalgia: Secondary | ICD-10-CM | POA: Diagnosis present

## 2014-03-14 DIAGNOSIS — Z79899 Other long term (current) drug therapy: Secondary | ICD-10-CM

## 2014-03-14 DIAGNOSIS — E8729 Other acidosis: Secondary | ICD-10-CM

## 2014-03-14 LAB — BASIC METABOLIC PANEL
ANION GAP: 12 (ref 5–15)
BUN: 19 mg/dL (ref 6–23)
CALCIUM: 8.5 mg/dL (ref 8.4–10.5)
CO2: 35 meq/L — AB (ref 19–32)
CREATININE: 1.42 mg/dL — AB (ref 0.50–1.35)
Chloride: 91 mEq/L — ABNORMAL LOW (ref 96–112)
GFR calc Af Amer: 60 mL/min — ABNORMAL LOW (ref >90)
GFR calc non Af Amer: 51 mL/min — ABNORMAL LOW (ref >90)
GLUCOSE: 212 mg/dL — AB (ref 70–99)
Potassium: 3.7 mEq/L (ref 3.7–5.3)
Sodium: 138 mEq/L (ref 137–147)

## 2014-03-14 LAB — BLOOD GAS, ARTERIAL
Acid-Base Excess: 10.5 mmol/L — ABNORMAL HIGH (ref 0.0–2.0)
Bicarbonate: 36.6 mEq/L — ABNORMAL HIGH (ref 20.0–24.0)
DRAWN BY: 22223
O2 CONTENT: 3 L/min
O2 SAT: 95.8 %
Patient temperature: 37
TCO2: 32.9 mmol/L (ref 0–100)
pCO2 arterial: 71.7 mmHg (ref 35.0–45.0)
pH, Arterial: 7.329 — ABNORMAL LOW (ref 7.350–7.450)
pO2, Arterial: 81.8 mmHg (ref 80.0–100.0)

## 2014-03-14 LAB — CBC WITH DIFFERENTIAL/PLATELET
Basophils Absolute: 0 10*3/uL (ref 0.0–0.1)
Basophils Relative: 0 % (ref 0–1)
EOS PCT: 0 % (ref 0–5)
Eosinophils Absolute: 0 10*3/uL (ref 0.0–0.7)
HEMATOCRIT: 43.5 % (ref 39.0–52.0)
HEMOGLOBIN: 14.2 g/dL (ref 13.0–17.0)
LYMPHS ABS: 0.7 10*3/uL (ref 0.7–4.0)
LYMPHS PCT: 4 % — AB (ref 12–46)
MCH: 29.6 pg (ref 26.0–34.0)
MCHC: 32.6 g/dL (ref 30.0–36.0)
MCV: 90.8 fL (ref 78.0–100.0)
MONO ABS: 0.8 10*3/uL (ref 0.1–1.0)
MONOS PCT: 4 % (ref 3–12)
NEUTROS ABS: 17 10*3/uL — AB (ref 1.7–7.7)
Neutrophils Relative %: 92 % — ABNORMAL HIGH (ref 43–77)
Platelets: 215 10*3/uL (ref 150–400)
RBC: 4.79 MIL/uL (ref 4.22–5.81)
RDW: 15.2 % (ref 11.5–15.5)
WBC: 18.5 10*3/uL — AB (ref 4.0–10.5)

## 2014-03-14 LAB — LACTIC ACID, PLASMA: Lactic Acid, Venous: 2.2 mmol/L (ref 0.5–2.2)

## 2014-03-14 MED ORDER — PIPERACILLIN-TAZOBACTAM 3.375 G IVPB 30 MIN
3.3750 g | Freq: Once | INTRAVENOUS | Status: AC
  Administered 2014-03-14: 3.375 g via INTRAVENOUS
  Filled 2014-03-14: qty 50

## 2014-03-14 MED ORDER — SODIUM CHLORIDE 0.9 % IV SOLN
INTRAVENOUS | Status: DC
  Administered 2014-03-14: 22:00:00 via INTRAVENOUS

## 2014-03-14 MED ORDER — ACETAMINOPHEN 500 MG PO TABS
ORAL_TABLET | ORAL | Status: AC
  Filled 2014-03-14: qty 2

## 2014-03-14 MED ORDER — ACETAMINOPHEN 500 MG PO TABS
1000.0000 mg | ORAL_TABLET | Freq: Once | ORAL | Status: AC
  Administered 2014-03-14: 1000 mg via ORAL

## 2014-03-14 MED ORDER — SODIUM CHLORIDE 0.9 % IV BOLUS (SEPSIS)
500.0000 mL | Freq: Once | INTRAVENOUS | Status: AC
  Administered 2014-03-14: 500 mL via INTRAVENOUS

## 2014-03-14 MED ORDER — IPRATROPIUM-ALBUTEROL 0.5-2.5 (3) MG/3ML IN SOLN
3.0000 mL | Freq: Once | RESPIRATORY_TRACT | Status: AC
  Administered 2014-03-14: 3 mL via RESPIRATORY_TRACT
  Filled 2014-03-14: qty 3

## 2014-03-14 MED ORDER — VANCOMYCIN HCL IN DEXTROSE 1-5 GM/200ML-% IV SOLN
1000.0000 mg | Freq: Once | INTRAVENOUS | Status: AC
  Administered 2014-03-15: 1000 mg via INTRAVENOUS
  Filled 2014-03-14: qty 200

## 2014-03-14 MED ORDER — SODIUM CHLORIDE 0.9 % IV SOLN: INTRAVENOUS | Status: DC

## 2014-03-14 NOTE — ED Notes (Signed)
Hospitalist at bedside 

## 2014-03-14 NOTE — ED Notes (Addendum)
Onset of low grade fever yesterday, today fever increased 102.5 @ 6:30 with increased sob. Family called Dr Luan Pulling who called in [facility] rx. Has had one dose today.

## 2014-03-14 NOTE — ED Provider Notes (Signed)
CSN: [id number]     Arrival date & time 03/14/14  1925 History  This chart was scribed for Fredia Sorrow, MD by Jeanell Sparrow, ED Scribe. This patient was seen in room APA09/APA09 and the patient's care was started at 9:07 PM.  Chief Complaint  Patient presents with  . Fever   Patient is a 63 y.o. male presenting with fever. The history is provided by the patient. No language interpreter was used.  Fever Max temp prior to arrival:  102.5 Onset quality:  Gradual Duration:  2 days Timing:  Constant Progression:  Worsening Chronicity:  New Relieved by:  None tried Worsened by:  Nothing tried Ineffective treatments:  None tried Associated symptoms: chest pain, cough, headaches and rash   Associated symptoms: no confusion, no diarrhea, no dysuria, no nausea, no rhinorrhea, no sore throat and no vomiting    HPI Comments: Connor Armstrong is a 63 y.o. male with a hx of COPD who presents to the Emergency Department complaining of a fever that started today. He states that the fever got worse today at home with a temperature of 102.5. His temperature in the ED today is 101.6. He reports associated chest pain and SOB. He reports that he also had a cough productive of yellowish-green phelgm. He reports that he took Levaquin with no relief. He states that he takes 10mg  of Prednisone daily. He states that he take 3 liters of oxygen at home and 6 liters when he is exercising. He reports that he uses a nebulizer at home also. He states that he uses a biPAP for sleeping. He reports no allergies to antibiotics. He states he has no hx of heart problems.  PCP- Dr.Hawkins  Past Medical History  Diagnosis Date  . COPD (chronic obstructive pulmonary disease)   . Fibromyalgia   . Shingles   . Cancer     kidney  . Helicobacter pylori gastritis March 2014  . Candida esophagitis March 2014  . On supplemental oxygen therapy     3L/min and on BPAP @ HS  . Hypertension     "he doesnt take medication  anymore because no longer has high blood pressure" per family   Past Surgical History  Procedure Laterality Date  . Hernia repair    . Thyroid surgery    . Parathyroid surgery      benign tumor  . Partial nephrectomy  2005    right  . Flexible sigmoidoscopy  09/27/2012    OZY:[id number]  . Esophagogastroduodenoscopy (egd) with esophageal dilation N/A 11/21/2012    SLF: DYSPHAGIA MOST LIKELY DUE TO CANDIDA ESOPHAGITIS/Small hiatal hernia/ MODERATE Non-erosive gastritis with +H.PYLORI ON PATH  . Colonoscopy N/A 01/20/2013    SLF:9 COLON POLYPS REMOVED/MILD diverticulosis in the descending colon and sigmoid colon/MODERATE INTERNAL HEMORRHOIDS  . Esophagogastroduodenoscopy (egd) with propofol N/A 01/27/2013    KCM:KLKJ WHITE PLAQUES IN THE ESOPHAGUS.  BRUSH BIOPSIES OBTAINED/Non-erosive gastritis (inflammation) was found in the gastric antrum; multiple bx/  . Savory dilation N/A 01/27/2013    Procedure: SAVORY DILATION;  Surgeon: Danie Binder, MD;  Location: [facility];  Service: Endoscopy;  Laterality: N/A;  . Esophageal biopsy N/A 01/27/2013    Procedure: BIOPSY;  Surgeon: Danie Binder, MD;  Location: [facility];  Service: Endoscopy;  Laterality: N/A;  Gastric Biopsies   Family History  Problem Relation Age of Onset  . Hypertension Mother   . Colon cancer Paternal Grandmother    History  Substance Use Topics  . Smoking status: Former  Smoker -- 1.50 packs/day for 42 years    Types: Cigarettes    Quit date: 09/17/2012  . Smokeless tobacco: Former Systems developer  . Alcohol Use: No     Comment: no ETOH in 30 years     Review of Systems  Constitutional: Positive for fever.  HENT: Negative for rhinorrhea and sore throat.   Eyes: Negative for visual disturbance.  Respiratory: Positive for cough and shortness of breath.   Cardiovascular: Positive for chest pain. Negative for leg swelling.  Gastrointestinal: Negative for nausea, vomiting, abdominal pain and diarrhea.  Genitourinary:  Negative for dysuria.  Musculoskeletal: Positive for back pain.  Skin: Positive for rash.  Neurological: Positive for headaches.  Hematological: Does not bruise/bleed easily.  Psychiatric/Behavioral: Negative for confusion.  All other systems reviewed and are negative.   Allergies  Morphine and related and Other  Home Medications   Prior to Admission medications   Medication Sig Start Date End Date Taking? Authorizing Provider  albuterol (PROVENTIL HFA;VENTOLIN HFA) 108 (90 BASE) MCG/ACT inhaler Inhale 2 puffs into the lungs every 6 (six) hours as needed. For shortness of breath    Yes Historical Provider, MD  albuterol (PROVENTIL) (2.5 MG/3ML) 0.083% nebulizer solution Take 2.5 mg by nebulization 3 (three) times daily as needed for wheezing or shortness of breath.  12/14/11  Yes Alonza Bogus, MD  busPIRone (BUSPAR) 5 MG tablet Take 1 tablet (5 mg total) by mouth 3 (three) times daily. 05/22/13  Yes Alonza Bogus, MD  cyclobenzaprine (FLEXERIL) 10 MG tablet Take 10 mg by mouth 2 (two) times daily.    Yes Historical Provider, MD  Fluticasone Furoate-Vilanterol (BREO ELLIPTA) 100-25 MCG/INH AEPB Inhale 1 puff into the lungs daily.    Yes Historical Provider, MD  furosemide (LASIX) 40 MG tablet Take 40 mg by mouth daily as needed for fluid.    Yes Historical Provider, MD  gabapentin (NEURONTIN) 300 MG capsule Take 300 mg by mouth 2 (two) times daily.    Yes Historical Provider, MD  levofloxacin (LEVAQUIN) 500 MG tablet Take 1 tablet (500 mg total) by mouth daily. 07/04/13  Yes Robert Bellow, MD  Milnacipran (SAVELLA) 50 MG TABS Take 50 mg by mouth 2 (two) times daily.    Yes Historical Provider, MD  predniSONE (DELTASONE) 10 MG tablet Take 10 mg by mouth daily.   Yes Historical Provider, MD  pseudoephedrine-guaifenesin (MUCINEX D) 60-600 MG per tablet Take 1 tablet by mouth every 12 (twelve) hours.   Yes Historical Provider, MD  roflumilast (DALIRESP) 500 MCG TABS tablet Take 1  tablet (500 mcg total) by mouth daily. 12/14/11  Yes Alonza Bogus, MD  tiotropium (SPIRIVA) 18 MCG inhalation capsule Place 18 mcg into inhaler and inhale daily.     Yes Historical Provider, MD   BP 127/70  Pulse 107  Temp(Src) 101.6 F (38.7 C) (Rectal)  Resp 20  Ht 5\' 11"  (1.803 m)  Wt 183 lb (83.008 kg)  BMI 25.53 kg/m2  SpO2 94% Physical Exam  Nursing note and vitals reviewed. Constitutional: He is oriented to person, place, and time. He appears well-developed and well-nourished. No distress.  HENT:  Head: Normocephalic and atraumatic.  Mouth/Throat: Oropharynx is clear and moist. No oropharyngeal exudate.  Eyes: Conjunctivae and EOM are normal. Pupils are equal, round, and reactive to light.  Neck: Normal range of motion. Neck supple.  Cardiovascular: Normal rate, regular rhythm, normal heart sounds and intact distal pulses.   No murmur heard. Pulmonary/Chest: Effort normal and  breath sounds normal. No respiratory distress.  Decreased breath sounds on both sides  Abdominal: Soft. Bowel sounds are normal. He exhibits distension. There is no tenderness. There is no rebound and no guarding.  Musculoskeletal: Normal range of motion. He exhibits no edema.  Neurological: He is alert and oriented to person, place, and time. No cranial nerve deficit. He exhibits normal muscle tone. Coordination normal.  Skin: Skin is warm.  Psychiatric: He has a normal mood and affect. His behavior is normal.    ED Course  Procedures (including critical care time) DIAGNOSTIC STUDIES: Oxygen Saturation is 94% on RA, normal by my interpretation.    COORDINATION OF CARE: 9:16 PM- Pt advised of plan for treatment which includes medication, labs, and radiology and pt agrees.  Labs Review Labs Reviewed  CBC WITH DIFFERENTIAL - Abnormal; Notable for the following:    WBC 18.5 (*)    Neutrophils Relative % 92 (*)    Neutro Abs 17.0 (*)    Lymphocytes Relative 4 (*)    All other components within  normal limits  BASIC METABOLIC PANEL - Abnormal; Notable for the following:    Chloride 91 (*)    CO2 35 (*)    Glucose, Bld 212 (*)    Creatinine, Ser 1.42 (*)    GFR calc non Af Amer 51 (*)    GFR calc Af Amer 60 (*)    All other components within normal limits  BLOOD GAS, ARTERIAL - Abnormal; Notable for the following:    pH, Arterial 7.329 (*)    pCO2 arterial 71.7 (*)    Bicarbonate 36.6 (*)    Acid-Base Excess 10.5 (*)    All other components within normal limits  CULTURE, BLOOD (SINGLE)  CULTURE, BLOOD (ROUTINE X 2)  URINE CULTURE  MRSA PCR SCREENING  LACTIC ACID, PLASMA  URINALYSIS, ROUTINE W REFLEX MICROSCOPIC  CBC  COMPREHENSIVE METABOLIC PANEL  TROPONIN I  TROPONIN I  TROPONIN I   Results for orders placed during the hospital encounter of 03/14/14  CBC WITH DIFFERENTIAL      Result Value Ref Range   WBC 18.5 (*) 4.0 - 10.5 K/uL   RBC 4.79  4.22 - 5.81 MIL/uL   Hemoglobin 14.2  13.0 - 17.0 g/dL   HCT 43.5  39.0 - 52.0 %   MCV 90.8  78.0 - 100.0 fL   MCH 29.6  26.0 - 34.0 pg   MCHC 32.6  30.0 - 36.0 g/dL   RDW 15.2  11.5 - 15.5 %   Platelets 215  150 - 400 K/uL   Neutrophils Relative % 92 (*) 43 - 77 %   Neutro Abs 17.0 (*) 1.7 - 7.7 K/uL   Lymphocytes Relative 4 (*) 12 - 46 %   Lymphs Abs 0.7  0.7 - 4.0 K/uL   Monocytes Relative 4  3 - 12 %   Monocytes Absolute 0.8  0.1 - 1.0 K/uL   Eosinophils Relative 0  0 - 5 %   Eosinophils Absolute 0.0  0.0 - 0.7 K/uL   Basophils Relative 0  0 - 1 %   Basophils Absolute 0.0  0.0 - 0.1 K/uL  BASIC METABOLIC PANEL      Result Value Ref Range   Sodium 138  137 - 147 mEq/L   Potassium 3.7  3.7 - 5.3 mEq/L   Chloride 91 (*) 96 - 112 mEq/L   CO2 35 (*) 19 - 32 mEq/L   Glucose, Bld 212 (*) 70 -  99 mg/dL   BUN 19  6 - 23 mg/dL   Creatinine, Ser 1.42 (*) 0.50 - 1.35 mg/dL   Calcium 8.5  8.4 - 10.5 mg/dL   GFR calc non Af Amer 51 (*) >90 mL/min   GFR calc Af Amer 60 (*) >90 mL/min   Anion gap 12  5 - 15  LACTIC ACID,  PLASMA      Result Value Ref Range   Lactic Acid, Venous 2.2  0.5 - 2.2 mmol/L  BLOOD GAS, ARTERIAL      Result Value Ref Range   O2 Content 3.0     Delivery systems NASAL CANNULA     pH, Arterial 7.329 (*) 7.350 - 7.450   pCO2 arterial 71.7 (*) 35.0 - 45.0 mmHg   pO2, Arterial 81.8  80.0 - 100.0 mmHg   Bicarbonate 36.6 (*) 20.0 - 24.0 mEq/L   TCO2 32.9  0 - 100 mmol/L   Acid-Base Excess 10.5 (*) 0.0 - 2.0 mmol/L   O2 Saturation 95.8     Patient temperature 37.0     Collection site RIGHT RADIAL     Drawn by [id number]     Sample type ARTERIAL     Allens test (pass/fail) PASS  PASS     Imaging Review Dg Chest 2 View  03/14/2014   CLINICAL DATA:  Shortness of breath.  Cough.  Congestion.  Fever.  EXAM: CHEST  2 VIEW  COMPARISON:  06/30/2013  FINDINGS: Normal heart size and pulmonary vascularity. Fairly prominent emphysematous changes in the lungs with scattered fibrosis bilaterally. Increased density in the right mid lung remains stable. Bullous changes in the apices and left lung base. No focal airspace disease or consolidation. Mediastinal contours appear intact. No pneumothorax. Postoperative changes in the base the neck. Appearance is similar to prior study.  IMPRESSION: Prominent emphysematous changes and fibrosis in the lungs. No evidence of active pulmonary disease appear   Electronically Signed   By: Lucienne Capers M.D.   On: 03/14/2014 21:12     EKG Interpretation   Date/Time:  Sunday March 14 2014 22:03:18 EDT Ventricular Rate:  123 PR Interval:  109 QRS Duration: 77 QT Interval:  323 QTC Calculation: 462 R Axis:   82 Text Interpretation:  Sinus tachycardia Atrial premature complexes  Consider right atrial enlargement Borderline right axis deviation Repol  abnrm suggests ischemia, diffuse leads No significant change since last  tracing Confirmed by Olden Klauer  MD, Vivan Agostino [phone]) on 03/14/2014 10:19:40 PM      CRITICAL CARE Performed by: Fredia Sorrow Total critical care  time: 30 Critical care time was exclusive of separately billable procedures and treating other patients. Critical care was necessary to treat or prevent imminent or life-threatening deterioration. Critical care was time spent personally by me on the following activities: development of treatment plan with patient and/or surrogate as well as nursing, discussions with consultants, evaluation of patient's response to treatment, examination of patient, obtaining history from patient or surrogate, ordering and performing treatments and interventions, ordering and review of laboratory studies, ordering and review of radiographic studies, pulse oximetry and re-evaluation of patient's condition.     MDM   Final diagnoses:  Respiratory acidosis  COPD with acute exacerbation  Other specified fever    The patient with end-stage COPD. Being evaluated at [facility] for lung transplant. Patient presenting with an exacerbation of his breathing. Also fevers up to 102.5. Chest x-ray negative for pneumonia. Patient's blood gas showed significant elevation in PCO2. Patient states this  may be baseline for him. Normally on BiPAP at night. To his respiratory problems not moving air well not improving with a nebulizer treatment. Patient started on BiPAP. Patient also cultured and treated as if perhaps could be early sepsis. Lactic acid was greater than 2 but less than 4. Patient will be admitted to step down. Patient will be admitted by the hospitalist for Dr. Luan Pulling service. Patient mentating fine. Patient with marked leukocytosis however he is on 10 mg of prednisone daily. Could represent that. But still with a high fever very suspicious for developing pneumonia. Patient was treated with Zosyn and vancomycin this was done before the chest x-ray was known. Antibiotics could be adjusted if the pneumonia develops.    I personally performed the services described in this documentation, which was scribed in my presence.  The recorded information has been reviewed and is accurate.       Fredia Sorrow, MD 03/15/14 [phone]

## 2014-03-15 ENCOUNTER — Encounter (HOSPITAL_COMMUNITY): Payer: Medicare Other

## 2014-03-15 DIAGNOSIS — E872 Acidosis, unspecified: Secondary | ICD-10-CM

## 2014-03-15 DIAGNOSIS — IMO0001 Reserved for inherently not codable concepts without codable children: Secondary | ICD-10-CM

## 2014-03-15 DIAGNOSIS — J9620 Acute and chronic respiratory failure, unspecified whether with hypoxia or hypercapnia: Secondary | ICD-10-CM

## 2014-03-15 DIAGNOSIS — J441 Chronic obstructive pulmonary disease with (acute) exacerbation: Secondary | ICD-10-CM | POA: Diagnosis present

## 2014-03-15 DIAGNOSIS — I10 Essential (primary) hypertension: Secondary | ICD-10-CM

## 2014-03-15 LAB — URINALYSIS, ROUTINE W REFLEX MICROSCOPIC
Bilirubin Urine: NEGATIVE
Glucose, UA: NEGATIVE mg/dL
HGB URINE DIPSTICK: NEGATIVE
Ketones, ur: NEGATIVE mg/dL
LEUKOCYTES UA: NEGATIVE
Nitrite: NEGATIVE
PH: 6.5 (ref 5.0–8.0)
Protein, ur: NEGATIVE mg/dL
Specific Gravity, Urine: 1.01 (ref 1.005–1.030)
Urobilinogen, UA: 0.2 mg/dL (ref 0.0–1.0)

## 2014-03-15 LAB — COMPREHENSIVE METABOLIC PANEL
ALBUMIN: 3.1 g/dL — AB (ref 3.5–5.2)
ALK PHOS: 92 U/L (ref 39–117)
ALT: 15 U/L (ref 0–53)
ANION GAP: 13 (ref 5–15)
AST: 16 U/L (ref 0–37)
BUN: 18 mg/dL (ref 6–23)
CALCIUM: 8.3 mg/dL — AB (ref 8.4–10.5)
CO2: 33 mEq/L — ABNORMAL HIGH (ref 19–32)
Chloride: 95 mEq/L — ABNORMAL LOW (ref 96–112)
Creatinine, Ser: 1.44 mg/dL — ABNORMAL HIGH (ref 0.50–1.35)
GFR calc non Af Amer: 51 mL/min — ABNORMAL LOW (ref >90)
GFR, EST AFRICAN AMERICAN: 59 mL/min — AB (ref >90)
GLUCOSE: 187 mg/dL — AB (ref 70–99)
Potassium: 3.7 mEq/L (ref 3.7–5.3)
SODIUM: 141 meq/L (ref 137–147)
TOTAL PROTEIN: 6.9 g/dL (ref 6.0–8.3)
Total Bilirubin: 0.3 mg/dL (ref 0.3–1.2)

## 2014-03-15 LAB — MRSA PCR SCREENING: MRSA by PCR: NEGATIVE

## 2014-03-15 LAB — CBC
HEMATOCRIT: 41.5 % (ref 39.0–52.0)
Hemoglobin: 13.5 g/dL (ref 13.0–17.0)
MCH: 29.8 pg (ref 26.0–34.0)
MCHC: 32.5 g/dL (ref 30.0–36.0)
MCV: 91.6 fL (ref 78.0–100.0)
Platelets: 218 10*3/uL (ref 150–400)
RBC: 4.53 MIL/uL (ref 4.22–5.81)
RDW: 15.5 % (ref 11.5–15.5)
WBC: 19.6 10*3/uL — ABNORMAL HIGH (ref 4.0–10.5)

## 2014-03-15 LAB — TROPONIN I: Troponin I: <0.3 ng/mL (ref <0.30)

## 2014-03-15 MED ORDER — SODIUM CHLORIDE 0.9 % IV SOLN: 250.0000 mL | INTRAVENOUS | Status: DC | PRN

## 2014-03-15 MED ORDER — ROFLUMILAST 500 MCG PO TABS
500.0000 ug | ORAL_TABLET | Freq: Every day | ORAL | Status: DC
  Administered 2014-03-15: 500 ug via ORAL
  Filled 2014-03-15 (×5): qty 1

## 2014-03-15 MED ORDER — ALBUTEROL SULFATE (2.5 MG/3ML) 0.083% IN NEBU
2.5000 mg | INHALATION_SOLUTION | Freq: Four times a day (QID) | RESPIRATORY_TRACT | Status: DC
Start: 2014-03-15 — End: 2014-03-15

## 2014-03-15 MED ORDER — BISACODYL 10 MG RE SUPP: 10.0000 mg | Freq: Every day | RECTAL | Status: DC | PRN

## 2014-03-15 MED ORDER — PSEUDOEPHEDRINE HCL 30 MG PO TABS
30.0000 mg | ORAL_TABLET | ORAL | Status: AC
  Administered 2014-03-15: 30 mg via ORAL
  Filled 2014-03-15: qty 1

## 2014-03-15 MED ORDER — MILNACIPRAN HCL 50 MG PO TABS
50.0000 mg | ORAL_TABLET | Freq: Two times a day (BID) | ORAL | Status: DC
  Administered 2014-03-15 (×2): 50 mg via ORAL
  Filled 2014-03-15 (×8): qty 1

## 2014-03-15 MED ORDER — PSEUDOEPHEDRINE HCL 60 MG PO TABS
30.0000 mg | ORAL_TABLET | Freq: Four times a day (QID) | ORAL | Status: DC
  Administered 2014-03-15 – 2014-03-16 (×5): 30 mg via ORAL
  Filled 2014-03-15 (×13): qty 1

## 2014-03-15 MED ORDER — GABAPENTIN 300 MG PO CAPS
300.0000 mg | ORAL_CAPSULE | Freq: Two times a day (BID) | ORAL | Status: DC
  Administered 2014-03-15 – 2014-03-16 (×4): 300 mg via ORAL
  Filled 2014-03-15 (×4): qty 1

## 2014-03-15 MED ORDER — HYDROCODONE-ACETAMINOPHEN 5-325 MG PO TABS
1.0000 | ORAL_TABLET | Freq: Once | ORAL | Status: AC
  Administered 2014-03-15: 1 via ORAL
  Filled 2014-03-15: qty 1

## 2014-03-15 MED ORDER — IPRATROPIUM BROMIDE 0.02 % IN SOLN
0.5000 mg | Freq: Four times a day (QID) | RESPIRATORY_TRACT | Status: DC
Start: 2014-03-15 — End: 2014-03-15

## 2014-03-15 MED ORDER — SODIUM CHLORIDE 0.9 % IV SOLN
INTRAVENOUS | Status: DC
  Administered 2014-03-15 – 2014-03-16 (×3): via INTRAVENOUS

## 2014-03-15 MED ORDER — METHYLPREDNISOLONE SODIUM SUCC 125 MG IJ SOLR
60.0000 mg | Freq: Four times a day (QID) | INTRAMUSCULAR | Status: DC
  Administered 2014-03-15: 60 mg via INTRAVENOUS
  Filled 2014-03-15: qty 2

## 2014-03-15 MED ORDER — SALINE SPRAY 0.65 % NA SOLN: 1.0000 | NASAL | Status: DC | PRN

## 2014-03-15 MED ORDER — GUAIFENESIN ER 600 MG PO TB12
600.0000 mg | ORAL_TABLET | Freq: Two times a day (BID) | ORAL | Status: DC
  Administered 2014-03-15 – 2014-03-16 (×4): 600 mg via ORAL
  Filled 2014-03-15 (×4): qty 1

## 2014-03-15 MED ORDER — IPRATROPIUM-ALBUTEROL 0.5-2.5 (3) MG/3ML IN SOLN
3.0000 mL | Freq: Four times a day (QID) | RESPIRATORY_TRACT | Status: DC
  Administered 2014-03-15: 3 mL via RESPIRATORY_TRACT
  Filled 2014-03-15: qty 3

## 2014-03-15 MED ORDER — CYCLOBENZAPRINE HCL 10 MG PO TABS
10.0000 mg | ORAL_TABLET | Freq: Two times a day (BID) | ORAL | Status: DC
  Administered 2014-03-15 – 2014-03-16 (×4): 10 mg via ORAL
  Filled 2014-03-15 (×4): qty 1

## 2014-03-15 MED ORDER — PSEUDOEPHEDRINE-GUAIFENESIN ER 60-600 MG PO TB12: 1.0000 | ORAL_TABLET | Freq: Two times a day (BID) | ORAL | Status: DC

## 2014-03-15 MED ORDER — BUSPIRONE HCL 5 MG PO TABS
5.0000 mg | ORAL_TABLET | Freq: Three times a day (TID) | ORAL | Status: DC
  Administered 2014-03-15 – 2014-03-16 (×4): 5 mg via ORAL
  Filled 2014-03-15 (×4): qty 1

## 2014-03-15 MED ORDER — PIPERACILLIN-TAZOBACTAM 3.375 G IVPB
INTRAVENOUS | Status: AC
  Filled 2014-03-15: qty 50

## 2014-03-15 MED ORDER — ACETAMINOPHEN 500 MG PO TABS
1000.0000 mg | ORAL_TABLET | Freq: Four times a day (QID) | ORAL | Status: DC | PRN
  Administered 2014-03-15 – 2014-03-16 (×2): 1000 mg via ORAL
  Filled 2014-03-15 (×2): qty 2

## 2014-03-15 MED ORDER — ONDANSETRON HCL 4 MG PO TABS: 4.0000 mg | ORAL_TABLET | Freq: Four times a day (QID) | ORAL | Status: DC | PRN

## 2014-03-15 MED ORDER — PSEUDOEPHEDRINE HCL 60 MG PO TABS
ORAL_TABLET | ORAL | Status: AC
  Filled 2014-03-15: qty 1

## 2014-03-15 MED ORDER — IPRATROPIUM-ALBUTEROL 0.5-2.5 (3) MG/3ML IN SOLN
3.0000 mL | Freq: Four times a day (QID) | RESPIRATORY_TRACT | Status: DC
  Administered 2014-03-15 – 2014-03-16 (×5): 3 mL via RESPIRATORY_TRACT
  Filled 2014-03-15 (×5): qty 3

## 2014-03-15 MED ORDER — HYDROCODONE-ACETAMINOPHEN 5-325 MG PO TABS
1.0000 | ORAL_TABLET | ORAL | Status: DC | PRN
Start: 2014-03-15 — End: 2014-03-16
  Administered 2014-03-15 (×2): 1 via ORAL
  Administered 2014-03-16: 2 via ORAL
  Filled 2014-03-15: qty 2
  Filled 2014-03-15 (×2): qty 1

## 2014-03-15 MED ORDER — PREDNISONE 10 MG PO TABS
10.0000 mg | ORAL_TABLET | Freq: Every day | ORAL | Status: DC
  Administered 2014-03-15 – 2014-03-16 (×2): 10 mg via ORAL
  Filled 2014-03-15 (×2): qty 1

## 2014-03-15 MED ORDER — SODIUM CHLORIDE 0.9 % IJ SOLN: 3.0000 mL | INTRAMUSCULAR | Status: DC | PRN

## 2014-03-15 MED ORDER — VANCOMYCIN HCL IN DEXTROSE 1-5 GM/200ML-% IV SOLN
1000.0000 mg | Freq: Two times a day (BID) | INTRAVENOUS | Status: DC
  Administered 2014-03-15 (×2): 1000 mg via INTRAVENOUS
  Filled 2014-03-15 (×3): qty 200

## 2014-03-15 MED ORDER — PIPERACILLIN-TAZOBACTAM 3.375 G IVPB
3.3750 g | Freq: Three times a day (TID) | INTRAVENOUS | Status: DC
  Administered 2014-03-15 – 2014-03-16 (×3): 3.375 g via INTRAVENOUS
  Filled 2014-03-15 (×4): qty 50

## 2014-03-15 MED ORDER — PIPERACILLIN-TAZOBACTAM 3.375 G IVPB
3.3750 g | Freq: Once | INTRAVENOUS | Status: AC
  Administered 2014-03-15: 3.375 g via INTRAVENOUS
  Filled 2014-03-15: qty 50

## 2014-03-15 MED ORDER — ENOXAPARIN SODIUM 40 MG/0.4ML ~~LOC~~ SOLN
40.0000 mg | SUBCUTANEOUS | Status: DC
Start: 2014-03-15 — End: 2014-03-16
  Administered 2014-03-15 – 2014-03-16 (×2): 40 mg via SUBCUTANEOUS
  Filled 2014-03-15 (×2): qty 0.4

## 2014-03-15 MED ORDER — SODIUM CHLORIDE 0.9 % IJ SOLN
3.0000 mL | Freq: Two times a day (BID) | INTRAMUSCULAR | Status: DC
  Administered 2014-03-15: 3 mL via INTRAVENOUS

## 2014-03-15 MED ORDER — ALBUTEROL SULFATE (2.5 MG/3ML) 0.083% IN NEBU: 2.5000 mg | INHALATION_SOLUTION | RESPIRATORY_TRACT | Status: DC | PRN

## 2014-03-15 MED ORDER — ONDANSETRON HCL 4 MG/2ML IJ SOLN: 4.0000 mg | Freq: Four times a day (QID) | INTRAMUSCULAR | Status: DC | PRN

## 2014-03-15 NOTE — Care Management Note (Addendum)
    Page 1 of 1   03/16/2014     4:47:40 PM CARE MANAGEMENT NOTE 03/16/2014  Patient:  Connor Armstrong, Connor Armstrong   Account Number:  [id number]  Date Initiated:  03/15/2014  Documentation initiated by:  Vladimir Creeks  Subjective/Objective Assessment:   pt admitted with end stage CPOD and resp distress. He is from home, with spouse. He will return home at D/C. He is awaiting a lung transplant     Action/Plan:   No needs identified at present, but will continue to follow   Anticipated DC Date:  03/16/2014   Anticipated DC Plan:  [place]  CM consult      Choice offered to / List presented to:             Status of service:  In process, will continue to follow Medicare Important Message given?  NA - LOS <3 / Initial given by admissions (If response is "NO", the following Medicare IM given date fields will be blank) Date Medicare IM given:   Medicare IM given by:   Date Additional Medicare IM given:   Additional Medicare IM given by:    Discharge Disposition:  HOME/SELF CARE  Per UR Regulation:  Reviewed for med. necessity/level of care/duration of stay  If discussed at [place] of Stay Meetings, dates discussed:    Comments:  03/16/14 Green[place] Avon Mergenthaler RN/CM Spoke with pt at home. Spouse just wanted to know it [facility] RN was coming to know what to expect. pt does not feel he needs RN, since he will not be home, but instructed to call MD if at anytime he feels he could use [facility] services, because they can be started from MD office also. He verbalizes understanding 03/16/14 1315 Vladimir Creeks RN/CM  Per T. Simpson, RN, who took pt out at D/C, spouse concerned over whether HH/RN coming. Attempted to call pt but no answer at home yet 03/16/14 1100 Vladimir Creeks RN/CM  Discussed HH, which he has had in the past,  but pt states he will be going to [facility] for testing all the rest of the week, and will not be home until late in the day, so probably not necessary. 03/15/14 1600  Vladimir Creeks RN/CM

## 2014-03-15 NOTE — Progress Notes (Signed)
Patient was resting with his eyes closed at the time when he had a short run of SVT with a heart rate as high as 176.  The strip is saved and in the chart for reference.  The patient already had a high heart rate of 120's to 125's throughout the shift.  He is currently on a steroid

## 2014-03-15 NOTE — Progress Notes (Signed)
ANTIBIOTIC CONSULT NOTE-Preliminary  Pharmacy Consult for Vancomycin and Zosyn Indication: Pneumonia  Allergies  Allergen Reactions  . Morphine And Related Other (See Comments)    Paralysis, "shuts down kidney and bowel tract"   . Other     All narcotics cause paralysis, and shuts down kidney and bowel tract     Patient Measurements: Height: 5\' 11"  (180.3 cm) Weight: 186 lb 11.7 oz (84.7 kg) IBW/kg (Calculated) : 75.3  Vital Signs: Temp: 100.2 F (37.9 C) (07/06 0100) Temp src: Oral (07/06 0100) BP: 127/70 mmHg (07/05 2014) Pulse Rate: 107 (07/05 2014)  Labs:  Recent Labs  03/14/14 2030  WBC 18.5*  HGB 14.2  PLT 215  CREATININE 1.42*    Estimated Creatinine Clearance: 57.4 ml/min (by C-G formula based on Cr of 1.42).  No results found for this basename: VANCOTROUGH, VANCOPEAK, VANCORANDOM, GENTTROUGH, GENTPEAK, GENTRANDOM, TOBRATROUGH, TOBRAPEAK, TOBRARND, AMIKACINPEAK, AMIKACINTROU, AMIKACIN,  in the last 72 hours   Microbiology: No results found for this or any previous visit (from the past 720 hour(s)).  Medical History: Past Medical History  Diagnosis Date  . COPD (chronic obstructive pulmonary disease)   . Fibromyalgia   . Shingles   . Cancer     kidney  . Helicobacter pylori gastritis March 2014  . Candida esophagitis March 2014  . On supplemental oxygen therapy     3L/min and on BPAP @ HS  . Hypertension     "he doesnt take medication anymore because no longer has high blood pressure" per family    Medications:  Zosyn 3.375 Gm IV in the ED  Vancomycin 1 GM IV in the ED   Assessment: 63 yo male with Hx of COPD admitted for increasing SOB, fever, and productive cough. WBCs elevated. Antibiotics to be started empirically.  Goal of Therapy:  Vancomycin troughs 15-20 mcg/ml Eradicate infection  Plan:  Preliminary review of pertinent patient information completed.  Protocol will be initiated with a one-time dose of Zosyn 3.375 Gm IV.  Forestine Na clinical pharmacist will complete review during morning rounds to assess patient and finalize treatment regimen.  Norberto Sorenson, [facility] 03/15/2014,1:24 AM .

## 2014-03-15 NOTE — Progress Notes (Signed)
Subjective: He says he feels much better. He has no new complaints. He is being set up for lung transplant evaluation and has some other testing scheduled for later this week. He is coughing a little bit but not coughing anything up. He is much less short of breath. He had some episodes of SVT during the night  Objective: Vital signs in last 24 hours: Temp:  [98.7 F (37.1 C)-101.6 F (38.7 C)] 98.7 F (37.1 C) (07/06 0400) Pulse Rate:  [96-133] 98 (07/06 0700) Resp:  [16-30] 18 (07/06 0700) BP: (114-144)/(58-92) 132/77 mmHg (07/06 0700) SpO2:  [74 %-98 %] 92 % (07/06 0733) FiO2 (%):  [30 %] 30 % (07/06 0733) Weight:  [83.008 kg (183 lb)-84.7 kg (186 lb 11.7 oz)] 84.7 kg (186 lb 11.7 oz) (07/06 0500) Weight change:  Last BM Date: 03/14/14  Intake/Output from previous day: 07/05 0701 - 07/06 0700 In: 1910 [P.O.:180; I.V.:930; IV Piggyback:800] Out: 525 [Urine:525]  PHYSICAL EXAM General appearance: alert, cooperative and mild distress Resp: Decreased breath sounds and rhonchi bilaterally Cardio: regular rate and rhythm, S1, S2 normal, no murmur, click, rub or gallop GI: soft, non-tender; bowel sounds normal; no masses,  no organomegaly Extremities: extremities normal, atraumatic, no cyanosis or edema  Lab Results:  Results for orders placed during the hospital encounter of 03/14/14 (from the past 48 hour(s))  CBC WITH DIFFERENTIAL     Status: Abnormal   Collection Time    03/14/14  8:30 PM      Result Value Ref Range   WBC 18.5 (*) 4.0 - 10.5 K/uL   RBC 4.79  4.22 - 5.81 MIL/uL   Hemoglobin 14.2  13.0 - 17.0 g/dL   HCT 43.5  39.0 - 52.0 %   MCV 90.8  78.0 - 100.0 fL   MCH 29.6  26.0 - 34.0 pg   MCHC 32.6  30.0 - 36.0 g/dL   RDW 15.2  11.5 - 15.5 %   Platelets 215  150 - 400 K/uL   Neutrophils Relative % 92 (*) 43 - 77 %   Neutro Abs 17.0 (*) 1.7 - 7.7 K/uL   Lymphocytes Relative 4 (*) 12 - 46 %   Lymphs Abs 0.7  0.7 - 4.0 K/uL   Monocytes Relative 4  3 - 12 %    Monocytes Absolute 0.8  0.1 - 1.0 K/uL   Eosinophils Relative 0  0 - 5 %   Eosinophils Absolute 0.0  0.0 - 0.7 K/uL   Basophils Relative 0  0 - 1 %   Basophils Absolute 0.0  0.0 - 0.1 K/uL  BASIC METABOLIC PANEL     Status: Abnormal   Collection Time    03/14/14  8:30 PM      Result Value Ref Range   Sodium 138  137 - 147 mEq/L   Potassium 3.7  3.7 - 5.3 mEq/L   Chloride 91 (*) 96 - 112 mEq/L   CO2 35 (*) 19 - 32 mEq/L   Glucose, Bld 212 (*) 70 - 99 mg/dL   BUN 19  6 - 23 mg/dL   Creatinine, Ser 1.42 (*) 0.50 - 1.35 mg/dL   Calcium 8.5  8.4 - 10.5 mg/dL   GFR calc non Af Amer 51 (*) >90 mL/min   GFR calc Af Amer 60 (*) >90 mL/min   Comment: (NOTE)     The eGFR has been calculated using the CKD EPI equation.     This calculation has not been validated in all  clinical situations.     eGFR's persistently <90 mL/min signify possible Chronic Kidney     Disease.   Anion gap 12  5 - 15  LACTIC ACID, PLASMA     Status: None   Collection Time    03/14/14  8:30 PM      Result Value Ref Range   Lactic Acid, Venous 2.2  0.5 - 2.2 mmol/L  BLOOD GAS, ARTERIAL     Status: Abnormal   Collection Time    03/14/14 10:09 PM      Result Value Ref Range   O2 Content 3.0     Delivery systems NASAL CANNULA     pH, Arterial 7.329 (*) 7.350 - 7.450   pCO2 arterial 71.7 (*) 35.0 - 45.0 mmHg   Comment: CRITICAL RESULT CALLED TO, READ BACK BY AND VERIFIED WITH:     BRANDY DANIELS,RN BY K KNICK RRT RCP ON 03/14/2014 AT 2215   pO2, Arterial 81.8  80.0 - 100.0 mmHg   Bicarbonate 36.6 (*) 20.0 - 24.0 mEq/L   TCO2 32.9  0 - 100 mmol/L   Acid-Base Excess 10.5 (*) 0.0 - 2.0 mmol/L   O2 Saturation 95.8     Patient temperature 37.0     Collection site RIGHT RADIAL     Drawn by [id number]     Sample type ARTERIAL     Allens test (pass/fail) PASS  PASS  MRSA PCR SCREENING     Status: None   Collection Time    03/15/14  1:09 AM      Result Value Ref Range   MRSA by PCR NEGATIVE  NEGATIVE   Comment:             The GeneXpert MRSA Assay (FDA     approved for NASAL specimens     only), is one component of a     comprehensive MRSA colonization     surveillance program. It is not     intended to diagnose MRSA     infection nor to guide or     monitor treatment for     MRSA infections.  URINALYSIS, ROUTINE W REFLEX MICROSCOPIC     Status: None   Collection Time    03/15/14  1:19 AM      Result Value Ref Range   Color, Urine YELLOW  YELLOW   APPearance CLEAR  CLEAR   Specific Gravity, Urine 1.010  1.005 - 1.030   pH 6.5  5.0 - 8.0   Glucose, UA NEGATIVE  NEGATIVE mg/dL   Hgb urine dipstick NEGATIVE  NEGATIVE   Bilirubin Urine NEGATIVE  NEGATIVE   Ketones, ur NEGATIVE  NEGATIVE mg/dL   Protein, ur NEGATIVE  NEGATIVE mg/dL   Urobilinogen, UA 0.2  0.0 - 1.0 mg/dL   Nitrite NEGATIVE  NEGATIVE   Leukocytes, UA NEGATIVE  NEGATIVE   Comment: MICROSCOPIC NOT DONE ON URINES WITH NEGATIVE PROTEIN, BLOOD, LEUKOCYTES, NITRITE, OR GLUCOSE <1000 mg/dL.  TROPONIN I     Status: None   Collection Time    03/15/14  1:41 AM      Result Value Ref Range   Troponin I <0.30  <0.30 ng/mL   Comment:            Due to the release kinetics of cTnI,     a negative result within the first hours     of the onset of symptoms does not rule out     myocardial infarction with certainty.  If myocardial infarction is still suspected,     repeat the test at appropriate intervals.  CBC     Status: Abnormal   Collection Time    03/15/14  4:47 AM      Result Value Ref Range   WBC 19.6 (*) 4.0 - 10.5 K/uL   RBC 4.53  4.22 - 5.81 MIL/uL   Hemoglobin 13.5  13.0 - 17.0 g/dL   HCT 41.5  39.0 - 52.0 %   MCV 91.6  78.0 - 100.0 fL   MCH 29.8  26.0 - 34.0 pg   MCHC 32.5  30.0 - 36.0 g/dL   RDW 15.5  11.5 - 15.5 %   Platelets 218  150 - 400 K/uL  COMPREHENSIVE METABOLIC PANEL     Status: Abnormal   Collection Time    03/15/14  4:47 AM      Result Value Ref Range   Sodium 141  137 - 147 mEq/L   Potassium 3.7  3.7 -  5.3 mEq/L   Chloride 95 (*) 96 - 112 mEq/L   CO2 33 (*) 19 - 32 mEq/L   Glucose, Bld 187 (*) 70 - 99 mg/dL   BUN 18  6 - 23 mg/dL   Creatinine, Ser 1.44 (*) 0.50 - 1.35 mg/dL   Calcium 8.3 (*) 8.4 - 10.5 mg/dL   Total Protein 6.9  6.0 - 8.3 g/dL   Albumin 3.1 (*) 3.5 - 5.2 g/dL   AST 16  0 - 37 U/L   ALT 15  0 - 53 U/L   Alkaline Phosphatase 92  39 - 117 U/L   Total Bilirubin 0.3  0.3 - 1.2 mg/dL   GFR calc non Af Amer 51 (*) >90 mL/min   GFR calc Af Amer 59 (*) >90 mL/min   Comment: (NOTE)     The eGFR has been calculated using the CKD EPI equation.     This calculation has not been validated in all clinical situations.     eGFR's persistently <90 mL/min signify possible Chronic Kidney     Disease.   Anion gap 13  5 - 15  TROPONIN I     Status: None   Collection Time    03/15/14  7:23 AM      Result Value Ref Range   Troponin I <0.30  <0.30 ng/mL   Comment:            Due to the release kinetics of cTnI,     a negative result within the first hours     of the onset of symptoms does not rule out     myocardial infarction with certainty.     If myocardial infarction is still suspected,     repeat the test at appropriate intervals.    ABGS  Recent Labs  03/14/14 2209  PHART 7.329*  PO2ART 81.8  TCO2 32.9  HCO3 36.6*   CULTURES Recent Results (from the past 240 hour(s))  MRSA PCR SCREENING     Status: None   Collection Time    03/15/14  1:09 AM      Result Value Ref Range Status   MRSA by PCR NEGATIVE  NEGATIVE Final   Comment:            The GeneXpert MRSA Assay (FDA     approved for NASAL specimens     only), is one component of a     comprehensive MRSA colonization     surveillance program. It is  not     intended to diagnose MRSA     infection nor to guide or     monitor treatment for     MRSA infections.   Studies/Results: Dg Chest 2 View  03/14/2014   CLINICAL DATA:  Shortness of breath.  Cough.  Congestion.  Fever.  EXAM: CHEST  2 VIEW  COMPARISON:   06/30/2013  FINDINGS: Normal heart size and pulmonary vascularity. Fairly prominent emphysematous changes in the lungs with scattered fibrosis bilaterally. Increased density in the right mid lung remains stable. Bullous changes in the apices and left lung base. No focal airspace disease or consolidation. Mediastinal contours appear intact. No pneumothorax. Postoperative changes in the base the neck. Appearance is similar to prior study.  IMPRESSION: Prominent emphysematous changes and fibrosis in the lungs. No evidence of active pulmonary disease appear   Electronically Signed   By: Lucienne Capers M.D.   On: 03/14/2014 21:12    Medications:  Prior to Admission:  Prescriptions prior to admission  Medication Sig Dispense Refill  . albuterol (PROVENTIL HFA;VENTOLIN HFA) 108 (90 BASE) MCG/ACT inhaler Inhale 2 puffs into the lungs every 6 (six) hours as needed. For shortness of breath       . albuterol (PROVENTIL) (2.5 MG/3ML) 0.083% nebulizer solution Take 2.5 mg by nebulization 3 (three) times daily as needed for wheezing or shortness of breath.       . busPIRone (BUSPAR) 5 MG tablet Take 1 tablet (5 mg total) by mouth 3 (three) times daily.  90 tablet  5  . cyclobenzaprine (FLEXERIL) 10 MG tablet Take 10 mg by mouth 2 (two) times daily.       . Fluticasone Furoate-Vilanterol (BREO ELLIPTA) 100-25 MCG/INH AEPB Inhale 1 puff into the lungs daily.       . furosemide (LASIX) 40 MG tablet Take 40 mg by mouth daily as needed for fluid.       Marland Kitchen gabapentin (NEURONTIN) 300 MG capsule Take 300 mg by mouth 2 (two) times daily.       Marland Kitchen levofloxacin (LEVAQUIN) 500 MG tablet Take 1 tablet (500 mg total) by mouth daily.  10 tablet  1  . Milnacipran (SAVELLA) 50 MG TABS Take 50 mg by mouth 2 (two) times daily.       . predniSONE (DELTASONE) 10 MG tablet Take 10 mg by mouth daily.      . pseudoephedrine-guaifenesin (MUCINEX D) 60-600 MG per tablet Take 1 tablet by mouth every 12 (twelve) hours.      . roflumilast  (DALIRESP) 500 MCG TABS tablet Take 1 tablet (500 mcg total) by mouth daily.  30 tablet  12  . tiotropium (SPIRIVA) 18 MCG inhalation capsule Place 18 mcg into inhaler and inhale daily.         Scheduled: . acetaminophen      . busPIRone  5 mg Oral TID  . cyclobenzaprine  10 mg Oral BID  . enoxaparin (LOVENOX) injection  40 mg Subcutaneous Q24H  . gabapentin  300 mg Oral BID  . guaiFENesin  600 mg Oral BID  . ipratropium-albuterol  3 mL Nebulization Q6H  . Milnacipran  50 mg Oral BID  . piperacillin-tazobactam (ZOSYN)  IV  3.375 g Intravenous Once  . piperacillin-tazobactam (ZOSYN)  IV  3.375 g Intravenous 3 times per day  . predniSONE  10 mg Oral Q breakfast  . pseudoephedrine  30 mg Oral Q6H  . roflumilast  500 mcg Oral Daily  . sodium chloride  3 mL  Intravenous Q12H  . vancomycin  1,000 mg Intravenous Q12H   Continuous: . sodium chloride 100 mL/hr at 03/15/14 0700   WUJ:WJXBJY chloride, albuterol, bisacodyl, HYDROcodone-acetaminophen, ondansetron (ZOFRAN) IV, ondansetron, sodium chloride, sodium chloride  Assesment: Is admitted with COPD exacerbation. He has chronic respiratory failure. He is being evaluated for lung transplantation and I think he is a candidate for that. He also has fibromyalgia which complicates his situation because of pain and hypertension. He feels much better. Active Problems:   Hypertension   Fibromyalgia   Respiratory acidosis   COPD exacerbation    Plan: Transfer out of the ICU switch him to oral prednisone    LOS: 1 day   Charnee Turnipseed L 03/15/2014, 8:17 AM

## 2014-03-15 NOTE — Care Management Utilization Note (Signed)
UR completed 

## 2014-03-15 NOTE — Progress Notes (Signed)
ANTIBIOTIC CONSULT NOTE - INITIAL  Pharmacy Consult for Vancomycin & Zosyn Indication: pneumonia  Allergies  Allergen Reactions  . Morphine And Related Other (See Comments)    Paralysis, "shuts down kidney and bowel tract"   . Other     All narcotics cause paralysis, and shuts down kidney and bowel tract     Patient Measurements: Height: 5\' 11"  (180.3 cm) Weight: 186 lb 11.7 oz (84.7 kg) IBW/kg (Calculated) : 75.3  Vital Signs: Temp: 98.7 F (37.1 C) (07/06 0400) Temp src: Oral (07/06 0100) BP: 114/58 mmHg (07/06 0600) Pulse Rate: 96 (07/06 0600) Intake/Output from previous day: 07/05 0701 - 07/06 0700 In: 1810 [P.O.:180; I.V.:830; IV Piggyback:800] Out: 525 [Urine:525] Intake/Output from this shift:    Labs:  Recent Labs  03/14/14 2030 03/15/14 0447  WBC 18.5* 19.6*  HGB 14.2 13.5  PLT 215 218  CREATININE 1.42* 1.44*   Estimated Creatinine Clearance: 56.6 ml/min (by C-G formula based on Cr of 1.44). No results found for this basename: VANCOTROUGH, Corlis Leak, VANCORANDOM, [place], GENTPEAK, GENTRANDOM, TOBRATROUGH, TOBRAPEAK, TOBRARND, AMIKACINPEAK, AMIKACINTROU, AMIKACIN,  in the last 72 hours   Microbiology: Recent Results (from the past 720 hour(s))  MRSA PCR SCREENING     Status: None   Collection Time    03/15/14  1:09 AM      Result Value Ref Range Status   MRSA by PCR NEGATIVE  NEGATIVE Final   Comment:            The GeneXpert MRSA Assay (FDA     approved for NASAL specimens     only), is one component of a     comprehensive MRSA colonization     surveillance program. It is not     intended to diagnose MRSA     infection nor to guide or     monitor treatment for     MRSA infections.    Medical History: Past Medical History  Diagnosis Date  . COPD (chronic obstructive pulmonary disease)   . Fibromyalgia   . Shingles   . Cancer     kidney  . Helicobacter pylori gastritis March 2014  . Candida esophagitis March 2014  . On supplemental  oxygen therapy     3L/min and on BPAP @ HS  . Hypertension     "he doesnt take medication anymore because no longer has high blood pressure" per family    Medications:  Scheduled:  . acetaminophen      . busPIRone  5 mg Oral TID  . cyclobenzaprine  10 mg Oral BID  . enoxaparin (LOVENOX) injection  40 mg Subcutaneous Q24H  . gabapentin  300 mg Oral BID  . guaiFENesin  600 mg Oral BID  . ipratropium-albuterol  3 mL Nebulization Q6H  . methylPREDNISolone (SOLU-MEDROL) injection  60 mg Intravenous Q6H  . Milnacipran  50 mg Oral BID  . piperacillin-tazobactam (ZOSYN)  IV  3.375 g Intravenous Once  . pseudoephedrine  30 mg Oral Q6H  . roflumilast  500 mcg Oral Daily  . sodium chloride  3 mL Intravenous Q12H   Assessment: 63 yo M with hx COPD presents with fever, SOB, and productive cough.  WBC elevated.  He was prescribed Levaquin yesterday, but only took 1 dose prior to coming to ED.  CXR negative for PNA.   MD note does not mention any risk factors for aspiration or health-care acquired PNA.   Scr is elevated above patient's baseline.  Vancomycin 7/5>> Zosyn 7/5>> Levaquin 7/5>>7/5  Goal of Therapy:  Vancomycin trough level 15-20 mcg/ml  Plan:  Zosyn 3.375gm IV Q8h to be infused over 4hrs Vancomycin 1gm IV q12h Check Vancomycin trough at steady state Monitor renal function and cx data  Duration of therapy per MD- consider de-escalate antibiotics for CAP or bronchitis unless patient has indication for HCAP or asp PNA.   Biagio Borg 03/15/2014,7:43 AM

## 2014-03-15 NOTE — H&P (Addendum)
PCP:   Alonza Bogus, MD   Chief Complaint:   fever   HPI:  63 year old male who  has a past medical history of COPD (chronic obstructive pulmonary disease); Fibromyalgia; Shingles; Cancer; Helicobacter pylori gastritis (March 2014); Candida esophagitis (March 2014); On supplemental oxygen therapy; and Hypertension. Presents to the ED with chief complaint of fever, patient says that he did have fever yesterday which was low grade and today became worse with temperature rising up to 102.5. Patient says that he has been feeling more short of breath than usual and this morning he started coughing up yellowish-green phlegm. He also had intermittent chest pain since last night. Patient was prescribed Levaquin by on-call physician with not much relief. Patient already takes prednisone 10 mg daily at home. Patient is on home oxygen with 3 L of oxygen although time, use BiPAP at bedtime. Patient is in the process of getting lung transplant and is followed by [facility]. He denies chest pain at this time, no nausea vomiting or diarrhea. He denies dysuria. In the ED patient was found to have temp of 101.6. ABG showed pH of 7.329, PCO2 71.7, PO2 81.8 patient is currently on BiPAP. White count 18.5, patient has been started empirically on vancomycin and Zosyn by the ED physician.  Allergies:   Allergies  Allergen Reactions  . Morphine And Related Other (See Comments)    Paralysis, "shuts down kidney and bowel tract"   . Other     All narcotics cause paralysis, and shuts down kidney and bowel tract       Past Medical History  Diagnosis Date  . COPD (chronic obstructive pulmonary disease)   . Fibromyalgia   . Shingles   . Cancer     kidney  . Helicobacter pylori gastritis March 2014  . Candida esophagitis March 2014  . On supplemental oxygen therapy     3L/min and on BPAP @ HS  . Hypertension     "he doesnt take medication anymore because no longer has high blood pressure" per family    Past  Surgical History  Procedure Laterality Date  . Hernia repair    . Thyroid surgery    . Parathyroid surgery      benign tumor  . Partial nephrectomy  2005    right  . Flexible sigmoidoscopy  09/27/2012    OTL:[id number]  . Esophagogastroduodenoscopy (egd) with esophageal dilation N/A 11/21/2012    SLF: DYSPHAGIA MOST LIKELY DUE TO CANDIDA ESOPHAGITIS/Small hiatal hernia/ MODERATE Non-erosive gastritis with +H.PYLORI ON PATH  . Colonoscopy N/A 01/20/2013    SLF:9 COLON POLYPS REMOVED/MILD diverticulosis in the descending colon and sigmoid colon/MODERATE INTERNAL HEMORRHOIDS  . Esophagogastroduodenoscopy (egd) with propofol N/A 01/27/2013    OZY:YQMG WHITE PLAQUES IN THE ESOPHAGUS.  BRUSH BIOPSIES OBTAINED/Non-erosive gastritis (inflammation) was found in the gastric antrum; multiple bx/  . Savory dilation N/A 01/27/2013    Procedure: SAVORY DILATION;  Surgeon: Danie Binder, MD;  Location: [facility];  Service: Endoscopy;  Laterality: N/A;  . Esophageal biopsy N/A 01/27/2013    Procedure: BIOPSY;  Surgeon: Danie Binder, MD;  Location: [facility];  Service: Endoscopy;  Laterality: N/A;  Gastric Biopsies    Prior to Admission medications   Medication Sig Start Date End Date Taking? Authorizing Provider  albuterol (PROVENTIL HFA;VENTOLIN HFA) 108 (90 BASE) MCG/ACT inhaler Inhale 2 puffs into the lungs every 6 (six) hours as needed. For shortness of breath    Yes Historical Provider, MD  albuterol (PROVENTIL) (2.5 MG/3ML)  0.083% nebulizer solution Take 2.5 mg by nebulization 3 (three) times daily as needed for wheezing or shortness of breath.  12/14/11  Yes Alonza Bogus, MD  busPIRone (BUSPAR) 5 MG tablet Take 1 tablet (5 mg total) by mouth 3 (three) times daily. 05/22/13  Yes Alonza Bogus, MD  cyclobenzaprine (FLEXERIL) 10 MG tablet Take 10 mg by mouth 2 (two) times daily.    Yes Historical Provider, MD  Fluticasone Furoate-Vilanterol (BREO ELLIPTA) 100-25 MCG/INH AEPB Inhale 1 puff  into the lungs daily.    Yes Historical Provider, MD  furosemide (LASIX) 40 MG tablet Take 40 mg by mouth daily as needed for fluid.    Yes Historical Provider, MD  gabapentin (NEURONTIN) 300 MG capsule Take 300 mg by mouth 2 (two) times daily.    Yes Historical Provider, MD  levofloxacin (LEVAQUIN) 500 MG tablet Take 1 tablet (500 mg total) by mouth daily. 07/04/13  Yes Robert Bellow, MD  Milnacipran (SAVELLA) 50 MG TABS Take 50 mg by mouth 2 (two) times daily.    Yes Historical Provider, MD  predniSONE (DELTASONE) 10 MG tablet Take 10 mg by mouth daily.   Yes Historical Provider, MD  pseudoephedrine-guaifenesin (MUCINEX D) 60-600 MG per tablet Take 1 tablet by mouth every 12 (twelve) hours.   Yes Historical Provider, MD  roflumilast (DALIRESP) 500 MCG TABS tablet Take 1 tablet (500 mcg total) by mouth daily. 12/14/11  Yes Alonza Bogus, MD  tiotropium (SPIRIVA) 18 MCG inhalation capsule Place 18 mcg into inhaler and inhale daily.     Yes Historical Provider, MD    Social History:  reports that he quit smoking about 17 months ago. His smoking use included Cigarettes. He has a 63 pack-year smoking history. He has quit using smokeless tobacco. He reports that he does not drink alcohol or use illicit drugs.  Family History  Problem Relation Age of Onset  . Hypertension Mother   . Colon cancer Paternal Grandmother      All the positives are listed in BOLD  Review of Systems:  HEENT: Headache, blurred vision, runny nose, sore throat Neck: Hypothyroidism, hyperthyroidism,,lymphadenopathy Chest : Shortness of breath, history of COPD, Asthma Heart : Chest pain, history of coronary arterey disease GI:  Nausea, vomiting, diarrhea, constipation, GERD GU: Dysuria, urgency, frequency of urination, hematuria Neuro: Stroke, seizures, syncope Psych: Depression, anxiety, hallucinations   Physical Exam: Blood pressure 127/70, pulse 107, temperature 101.5 F (38.6 C), temperature source  Rectal, resp. rate 20, height 5\' 11"  (1.803 m), weight 83.008 kg (183 lb), SpO2 98.00%. Constitutional:   Patient is a well-developed and well-nourished male in mild respiratory distress, currently on BiPAP Head: Normocephalic and atraumatic Eyes: PERRL, EOMI, conjunctivae normal Cardiovascular: RRR, S1 normal, S2 normal Pulmonary/Chest: Decreased breath sounds, wheezing bilaterally Abdominal: Soft. Non-tender, non-distended, bowel sounds are normal, no masses, organomegaly, or guarding present.  Neurological: A&O x3, Strenght is normal and symmetric bilaterally, cranial nerve II-XII are grossly intact, no focal motor deficit, sensory intact to light touch bilaterally.  Extremities : No Cyanosis, Clubbing or Edema  Labs on Admission:  Basic Metabolic Panel:  Recent Labs Lab 03/14/14 2030  NA 138  K 3.7  CL 91*  CO2 35*  GLUCOSE 212*  BUN 19  CREATININE 1.42*  CALCIUM 8.5   CBC:  Recent Labs Lab 03/14/14 2030  WBC 18.5*  NEUTROABS 17.0*  HGB 14.2  HCT 43.5  MCV 90.8  PLT 215    Radiological Exams on Admission: Dg Chest  2 View  03/14/2014   CLINICAL DATA:  Shortness of breath.  Cough.  Congestion.  Fever.  EXAM: CHEST  2 VIEW  COMPARISON:  06/30/2013  FINDINGS: Normal heart size and pulmonary vascularity. Fairly prominent emphysematous changes in the lungs with scattered fibrosis bilaterally. Increased density in the right mid lung remains stable. Bullous changes in the apices and left lung base. No focal airspace disease or consolidation. Mediastinal contours appear intact. No pneumothorax. Postoperative changes in the base the neck. Appearance is similar to prior study.  IMPRESSION: Prominent emphysematous changes and fibrosis in the lungs. No evidence of active pulmonary disease appear   Electronically Signed   By: Lucienne Capers M.D.   On: 03/14/2014 21:12    EKG: Independently reviewed. Sinus tachycardia    Assessment/Plan Active Problems:   Hypertension    Fibromyalgia   Respiratory acidosis   COPD exacerbation  Acute on chronic respiratory failure Patient has end-stage COPD, and now presenting with respiratory distress. Patient will be started on Solu-Medrol 60 mg IV every 6 hours, we'll continue with vancomycin and Zosyn started empirically by ED physician for possible pneumonia. Chest x-ray does not show pneumonia. We'll continue his oxygen and BiPAP, patient will be admitted to the step down unit.  Fever Likely due to acute bronchitis versus pneumonia, chest x-ray does not show pneumonia, will check UA and culture. Follow the urine culture results, patient has been started empirically on vancomycin and Zosyn as above.  AK I. Patient's creatinine is 1.42, which have been a baseline creatinine of 0.98. We'll continue with gentle IV hydration normal saline at 75 mL per hour.  Chest pain Patient had intermittent chest pain since last night, he denies pain at this time. Troponin done in the ED is negative EKG showed sinus tachycardia. Will cycle cardiac enzymes x3  Fibromyalgia Continue her medications including BuSpar and Savella.  DVT prophylaxis Lovenox  Code status: Patient is full code  Family discussion: Admission, patients condition and plan of care including tests being ordered have been discussed with the patient and his wife at bedside who indicate understanding and agree with the plan and Code Status.   Time Spent on Admission: 60 minutes  [facility] Pager: [phone] 03/15/2014, 12:23 AM  If 7PM-7AM, please contact night-coverage  [email or web address]  Password [email or web address]

## 2014-03-15 NOTE — Progress Notes (Signed)
Called report to Leroy Kennedy, RN.  Verbalized understanding.  Pt transferred to floor in safe and stable condition.  Schonewitz, Eulis Canner 03/15/2014

## 2014-03-16 LAB — URINE CULTURE
CULTURE: NO GROWTH
Colony Count: NO GROWTH

## 2014-03-16 MED ORDER — LEVOFLOXACIN 500 MG PO TABS
500.0000 mg | ORAL_TABLET | Freq: Every day | ORAL | Status: DC
  Administered 2014-03-16: 500 mg via ORAL
  Filled 2014-03-16: qty 1

## 2014-03-16 MED ORDER — PREDNISONE 20 MG PO TABS
40.0000 mg | ORAL_TABLET | Freq: Every day | ORAL | Status: DC
  Administered 2014-03-16: 40 mg via ORAL
  Filled 2014-03-16: qty 2

## 2014-03-16 MED ORDER — PREDNISONE 10 MG PO TABS
10.0000 mg | ORAL_TABLET | Freq: Every day | ORAL | Status: DC
Start: 2014-03-16 — End: 2014-04-10

## 2014-03-16 NOTE — Progress Notes (Signed)
At 0725hr, RT removed patient from BIPAP and gave breathing treatment. Patient tolerated well. Post treatment patient was placed on 3L O2 per home usage. RT will continue to monitor

## 2014-03-16 NOTE — Progress Notes (Signed)
Patient being d/c home with instructions. Verbalizes understanding. IV cath removed and intact. No pain/swelling at site. Follow up appointments discussed with patient. No c/o pain at this time.

## 2014-03-16 NOTE — Progress Notes (Signed)
Subjective: He is awake and alert and says he feels better. He wants to go home.  Objective: Vital signs in last 24 hours: Temp:  [97.2 F (36.2 C)-98.2 F (36.8 C)] 97.2 F (36.2 C) (07/07 4010) Pulse Rate:  [93-112] 93 (07/07 0633) Resp:  [18-20] 20 (07/07 0633) BP: (131-151)/(64-79) 131/76 mmHg (07/07 0633) SpO2:  [92 %-99 %] 97 % (07/07 0725) FiO2 (%):  [30 %] 30 % (07/07 0725) Weight:  [85.186 kg (187 lb 12.8 oz)] 85.186 kg (187 lb 12.8 oz) (07/06 1224) Weight change: 2.177 kg (4 lb 12.8 oz) Last BM Date: 03/14/14  Intake/Output from previous day: 07/06 0701 - 07/07 0700 In: 720 [P.O.:720] Out: 1600 [Urine:1600]  PHYSICAL EXAM General appearance: alert, cooperative and mild distress Resp: clear to auscultation bilaterally Cardio: regular rate and rhythm, S1, S2 normal, no murmur, click, rub or gallop GI: soft, non-tender; bowel sounds normal; no masses,  no organomegaly Extremities: extremities normal, atraumatic, no cyanosis or edema  Lab Results:  Results for orders placed during the hospital encounter of 03/14/14 (from the past 48 hour(s))  CBC WITH DIFFERENTIAL     Status: Abnormal   Collection Time    03/14/14  8:30 PM      Result Value Ref Range   WBC 18.5 (*) 4.0 - 10.5 K/uL   RBC 4.79  4.22 - 5.81 MIL/uL   Hemoglobin 14.2  13.0 - 17.0 g/dL   HCT 43.5  39.0 - 52.0 %   MCV 90.8  78.0 - 100.0 fL   MCH 29.6  26.0 - 34.0 pg   MCHC 32.6  30.0 - 36.0 g/dL   RDW 15.2  11.5 - 15.5 %   Platelets 215  150 - 400 K/uL   Neutrophils Relative % 92 (*) 43 - 77 %   Neutro Abs 17.0 (*) 1.7 - 7.7 K/uL   Lymphocytes Relative 4 (*) 12 - 46 %   Lymphs Abs 0.7  0.7 - 4.0 K/uL   Monocytes Relative 4  3 - 12 %   Monocytes Absolute 0.8  0.1 - 1.0 K/uL   Eosinophils Relative 0  0 - 5 %   Eosinophils Absolute 0.0  0.0 - 0.7 K/uL   Basophils Relative 0  0 - 1 %   Basophils Absolute 0.0  0.0 - 0.1 K/uL  BASIC METABOLIC PANEL     Status: Abnormal   Collection Time    03/14/14   8:30 PM      Result Value Ref Range   Sodium 138  137 - 147 mEq/L   Potassium 3.7  3.7 - 5.3 mEq/L   Chloride 91 (*) 96 - 112 mEq/L   CO2 35 (*) 19 - 32 mEq/L   Glucose, Bld 212 (*) 70 - 99 mg/dL   BUN 19  6 - 23 mg/dL   Creatinine, Ser 1.42 (*) 0.50 - 1.35 mg/dL   Calcium 8.5  8.4 - 10.5 mg/dL   GFR calc non Af Amer 51 (*) >90 mL/min   GFR calc Af Amer 60 (*) >90 mL/min   Comment: (NOTE)     The eGFR has been calculated using the CKD EPI equation.     This calculation has not been validated in all clinical situations.     eGFR's persistently <90 mL/min signify possible Chronic Kidney     Disease.   Anion gap 12  5 - 15  LACTIC ACID, PLASMA     Status: None   Collection Time  03/14/14  8:30 PM      Result Value Ref Range   Lactic Acid, Venous 2.2  0.5 - 2.2 mmol/L  BLOOD GAS, ARTERIAL     Status: Abnormal   Collection Time    03/14/14 10:09 PM      Result Value Ref Range   O2 Content 3.0     Delivery systems NASAL CANNULA     pH, Arterial 7.329 (*) 7.350 - 7.450   pCO2 arterial 71.7 (*) 35.0 - 45.0 mmHg   Comment: CRITICAL RESULT CALLED TO, READ BACK BY AND VERIFIED WITH:     BRANDY DANIELS,RN BY K KNICK RRT RCP ON 03/14/2014 AT 2215   pO2, Arterial 81.8  80.0 - 100.0 mmHg   Bicarbonate 36.6 (*) 20.0 - 24.0 mEq/L   TCO2 32.9  0 - 100 mmol/L   Acid-Base Excess 10.5 (*) 0.0 - 2.0 mmol/L   O2 Saturation 95.8     Patient temperature 37.0     Collection site RIGHT RADIAL     Drawn by [id number]     Sample type ARTERIAL     Allens test (pass/fail) PASS  PASS  MRSA PCR SCREENING     Status: None   Collection Time    03/15/14  1:09 AM      Result Value Ref Range   MRSA by PCR NEGATIVE  NEGATIVE   Comment:            The GeneXpert MRSA Assay (FDA     approved for NASAL specimens     only), is one component of a     comprehensive MRSA colonization     surveillance program. It is not     intended to diagnose MRSA     infection nor to guide or     monitor treatment for      MRSA infections.  URINALYSIS, ROUTINE W REFLEX MICROSCOPIC     Status: None   Collection Time    03/15/14  1:19 AM      Result Value Ref Range   Color, Urine YELLOW  YELLOW   APPearance CLEAR  CLEAR   Specific Gravity, Urine 1.010  1.005 - 1.030   pH 6.5  5.0 - 8.0   Glucose, UA NEGATIVE  NEGATIVE mg/dL   Hgb urine dipstick NEGATIVE  NEGATIVE   Bilirubin Urine NEGATIVE  NEGATIVE   Ketones, ur NEGATIVE  NEGATIVE mg/dL   Protein, ur NEGATIVE  NEGATIVE mg/dL   Urobilinogen, UA 0.2  0.0 - 1.0 mg/dL   Nitrite NEGATIVE  NEGATIVE   Leukocytes, UA NEGATIVE  NEGATIVE   Comment: MICROSCOPIC NOT DONE ON URINES WITH NEGATIVE PROTEIN, BLOOD, LEUKOCYTES, NITRITE, OR GLUCOSE <1000 mg/dL.  TROPONIN I     Status: None   Collection Time    03/15/14  1:41 AM      Result Value Ref Range   Troponin I <0.30  <0.30 ng/mL   Comment:            Due to the release kinetics of cTnI,     a negative result within the first hours     of the onset of symptoms does not rule out     myocardial infarction with certainty.     If myocardial infarction is still suspected,     repeat the test at appropriate intervals.  CBC     Status: Abnormal   Collection Time    03/15/14  4:47 AM      Result Value Ref Range  WBC 19.6 (*) 4.0 - 10.5 K/uL   RBC 4.53  4.22 - 5.81 MIL/uL   Hemoglobin 13.5  13.0 - 17.0 g/dL   HCT 41.5  39.0 - 52.0 %   MCV 91.6  78.0 - 100.0 fL   MCH 29.8  26.0 - 34.0 pg   MCHC 32.5  30.0 - 36.0 g/dL   RDW 15.5  11.5 - 15.5 %   Platelets 218  150 - 400 K/uL  COMPREHENSIVE METABOLIC PANEL     Status: Abnormal   Collection Time    03/15/14  4:47 AM      Result Value Ref Range   Sodium 141  137 - 147 mEq/L   Potassium 3.7  3.7 - 5.3 mEq/L   Chloride 95 (*) 96 - 112 mEq/L   CO2 33 (*) 19 - 32 mEq/L   Glucose, Bld 187 (*) 70 - 99 mg/dL   BUN 18  6 - 23 mg/dL   Creatinine, Ser 1.44 (*) 0.50 - 1.35 mg/dL   Calcium 8.3 (*) 8.4 - 10.5 mg/dL   Total Protein 6.9  6.0 - 8.3 g/dL   Albumin 3.1 (*)  3.5 - 5.2 g/dL   AST 16  0 - 37 U/L   ALT 15  0 - 53 U/L   Alkaline Phosphatase 92  39 - 117 U/L   Total Bilirubin 0.3  0.3 - 1.2 mg/dL   GFR calc non Af Amer 51 (*) >90 mL/min   GFR calc Af Amer 59 (*) >90 mL/min   Comment: (NOTE)     The eGFR has been calculated using the CKD EPI equation.     This calculation has not been validated in all clinical situations.     eGFR's persistently <90 mL/min signify possible Chronic Kidney     Disease.   Anion gap 13  5 - 15  TROPONIN I     Status: None   Collection Time    03/15/14  7:23 AM      Result Value Ref Range   Troponin I <0.30  <0.30 ng/mL   Comment:            Due to the release kinetics of cTnI,     a negative result within the first hours     of the onset of symptoms does not rule out     myocardial infarction with certainty.     If myocardial infarction is still suspected,     repeat the test at appropriate intervals.  TROPONIN I     Status: None   Collection Time    03/15/14 12:52 PM      Result Value Ref Range   Troponin I <0.30  <0.30 ng/mL   Comment:            Due to the release kinetics of cTnI,     a negative result within the first hours     of the onset of symptoms does not rule out     myocardial infarction with certainty.     If myocardial infarction is still suspected,     repeat the test at appropriate intervals.    ABGS  Recent Labs  03/14/14 2209  PHART 7.329*  PO2ART 81.8  TCO2 32.9  HCO3 36.6*   CULTURES Recent Results (from the past 240 hour(s))  MRSA PCR SCREENING     Status: None   Collection Time    03/15/14  1:09 AM      Result Value Ref  Range Status   MRSA by PCR NEGATIVE  NEGATIVE Final   Comment:            The GeneXpert MRSA Assay (FDA     approved for NASAL specimens     only), is one component of a     comprehensive MRSA colonization     surveillance program. It is not     intended to diagnose MRSA     infection nor to guide or     monitor treatment for     MRSA  infections.   Studies/Results: Dg Chest 2 View  03/14/2014   CLINICAL DATA:  Shortness of breath.  Cough.  Congestion.  Fever.  EXAM: CHEST  2 VIEW  COMPARISON:  06/30/2013  FINDINGS: Normal heart size and pulmonary vascularity. Fairly prominent emphysematous changes in the lungs with scattered fibrosis bilaterally. Increased density in the right mid lung remains stable. Bullous changes in the apices and left lung base. No focal airspace disease or consolidation. Mediastinal contours appear intact. No pneumothorax. Postoperative changes in the base the neck. Appearance is similar to prior study.  IMPRESSION: Prominent emphysematous changes and fibrosis in the lungs. No evidence of active pulmonary disease appear   Electronically Signed   By: Lucienne Capers M.D.   On: 03/14/2014 21:12    Medications:  Prior to Admission:  Prescriptions prior to admission  Medication Sig Dispense Refill  . albuterol (PROVENTIL HFA;VENTOLIN HFA) 108 (90 BASE) MCG/ACT inhaler Inhale 2 puffs into the lungs every 6 (six) hours as needed. For shortness of breath       . albuterol (PROVENTIL) (2.5 MG/3ML) 0.083% nebulizer solution Take 2.5 mg by nebulization 3 (three) times daily as needed for wheezing or shortness of breath.       . busPIRone (BUSPAR) 5 MG tablet Take 1 tablet (5 mg total) by mouth 3 (three) times daily.  90 tablet  5  . cyclobenzaprine (FLEXERIL) 10 MG tablet Take 10 mg by mouth 2 (two) times daily.       . Fluticasone Furoate-Vilanterol (BREO ELLIPTA) 100-25 MCG/INH AEPB Inhale 1 puff into the lungs daily.       . furosemide (LASIX) 40 MG tablet Take 40 mg by mouth daily as needed for fluid.       Marland Kitchen gabapentin (NEURONTIN) 300 MG capsule Take 300 mg by mouth 2 (two) times daily.       Marland Kitchen levofloxacin (LEVAQUIN) 500 MG tablet Take 1 tablet (500 mg total) by mouth daily.  10 tablet  1  . Milnacipran (SAVELLA) 50 MG TABS Take 50 mg by mouth 2 (two) times daily.       . predniSONE (DELTASONE) 10 MG tablet  Take 10 mg by mouth daily.      . pseudoephedrine-guaifenesin (MUCINEX D) 60-600 MG per tablet Take 1 tablet by mouth every 12 (twelve) hours.      . roflumilast (DALIRESP) 500 MCG TABS tablet Take 1 tablet (500 mcg total) by mouth daily.  30 tablet  12  . tiotropium (SPIRIVA) 18 MCG inhalation capsule Place 18 mcg into inhaler and inhale daily.         Scheduled: . busPIRone  5 mg Oral TID  . cyclobenzaprine  10 mg Oral BID  . enoxaparin (LOVENOX) injection  40 mg Subcutaneous Q24H  . gabapentin  300 mg Oral BID  . guaiFENesin  600 mg Oral BID  . ipratropium-albuterol  3 mL Nebulization Q6H  . levofloxacin  500 mg Oral Daily  .  Milnacipran  50 mg Oral BID  . predniSONE  40 mg Oral Q breakfast  . pseudoephedrine  30 mg Oral Q6H  . roflumilast  500 mcg Oral Daily  . sodium chloride  3 mL Intravenous Q12H   Continuous: . sodium chloride 100 mL/hr at 03/16/14 0550   LGS:PJSUNH chloride, acetaminophen, albuterol, bisacodyl, HYDROcodone-acetaminophen, ondansetron (ZOFRAN) IV, ondansetron, sodium chloride, sodium chloride  Assesment: He was admitted with COPD exacerbation. He had acute on chronic respiratory failure. He is markedly improved. Active Problems:   Hypertension   Fibromyalgia   Acute-on-chronic respiratory failure   Respiratory acidosis   COPD exacerbation    Plan: I think he can probably go home    LOS: 2 days   Shanel Prazak L 03/16/2014, 8:59 AM

## 2014-03-17 ENCOUNTER — Encounter (HOSPITAL_COMMUNITY): Payer: Medicare Other

## 2014-03-19 LAB — CULTURE, BLOOD (SINGLE): CULTURE: NO GROWTH

## 2014-03-19 LAB — CULTURE, BLOOD (ROUTINE X 2): CULTURE: NO GROWTH

## 2014-03-20 NOTE — Discharge Summary (Signed)
Physician Discharge Summary  Patient ID: Connor Armstrong MRN: [id number] DOB/AGE: 1951/03/01 63 y.o. Primary Care Physician:Jecenia Leamer L, MD Admit date: 03/14/2014 Discharge date: 03/20/2014    Discharge Diagnoses:   Active Problems:   Hypertension   Fibromyalgia   Acute-on-chronic respiratory failure   Respiratory acidosis   COPD exacerbation     Medication List         albuterol 108 (90 BASE) MCG/ACT inhaler  Commonly known as:  PROVENTIL HFA;VENTOLIN HFA  Inhale 2 puffs into the lungs every 6 (six) hours as needed. For shortness of breath     albuterol (2.5 MG/3ML) 0.083% nebulizer solution  Commonly known as:  PROVENTIL  Take 2.5 mg by nebulization 3 (three) times daily as needed for wheezing or shortness of breath.     BREO ELLIPTA 100-25 MCG/INH Aepb  Generic drug:  Fluticasone Furoate-Vilanterol  Inhale 1 puff into the lungs daily.     busPIRone 5 MG tablet  Commonly known as:  BUSPAR  Take 1 tablet (5 mg total) by mouth 3 (three) times daily.     cyclobenzaprine 10 MG tablet  Commonly known as:  FLEXERIL  Take 10 mg by mouth 2 (two) times daily.     furosemide 40 MG tablet  Commonly known as:  LASIX  Take 40 mg by mouth daily as needed for fluid.     gabapentin 300 MG capsule  Commonly known as:  NEURONTIN  Take 300 mg by mouth 2 (two) times daily.     levofloxacin 500 MG tablet  Commonly known as:  LEVAQUIN  Take 1 tablet (500 mg total) by mouth daily.     Milnacipran 50 MG Tabs tablet  Commonly known as:  SAVELLA  Take 50 mg by mouth 2 (two) times daily.     predniSONE 10 MG tablet  Commonly known as:  DELTASONE  Take 1 tablet (10 mg total) by mouth daily.     pseudoephedrine-guaifenesin 60-600 MG per tablet  Commonly known as:  MUCINEX D  Take 1 tablet by mouth every 12 (twelve) hours.     roflumilast 500 MCG Tabs tablet  Commonly known as:  DALIRESP  Take 1 tablet (500 mcg total) by mouth daily.     tiotropium 18 MCG inhalation  capsule  Commonly known as:  SPIRIVA  Place 18 mcg into inhaler and inhale daily.        Discharged Condition: Improved    Consults: None  Significant Diagnostic Studies: Dg Chest 2 View  03/14/2014   CLINICAL DATA:  Shortness of breath.  Cough.  Congestion.  Fever.  EXAM: CHEST  2 VIEW  COMPARISON:  06/30/2013  FINDINGS: Normal heart size and pulmonary vascularity. Fairly prominent emphysematous changes in the lungs with scattered fibrosis bilaterally. Increased density in the right mid lung remains stable. Bullous changes in the apices and left lung base. No focal airspace disease or consolidation. Mediastinal contours appear intact. No pneumothorax. Postoperative changes in the base the neck. Appearance is similar to prior study.  IMPRESSION: Prominent emphysematous changes and fibrosis in the lungs. No evidence of active pulmonary disease appear   Electronically Signed   By: Lucienne Capers M.D.   On: 03/14/2014 21:12    Lab Results: Basic Metabolic Panel: No results found for this basename: NA, K, CL, CO2, GLUCOSE, BUN, CREATININE, CALCIUM, MG, PHOS,  in the last 72 hours Liver Function Tests: No results found for this basename: AST, ALT, ALKPHOS, BILITOT, PROT, ALBUMIN,  in the last 72 hours  CBC: No results found for this basename: WBC, NEUTROABS, HGB, HCT, MCV, PLT,  in the last 72 hours  Recent Results (from the past 240 hour(s))  CULTURE, BLOOD (SINGLE)     Status: None   Collection Time    03/14/14  8:30 PM      Result Value Ref Range Status   Specimen Description BLOOD LEFT ANTECUBITAL   Final   Special Requests BOTTLES DRAWN AEROBIC AND ANAEROBIC 10CC   Final   Culture NO GROWTH 5 DAYS   Final   Report Status 03/19/2014 FINAL   Final  CULTURE, BLOOD (ROUTINE X 2)     Status: None   Collection Time    03/14/14 10:21 PM      Result Value Ref Range Status   Specimen Description BLOOD RIGHT ANTECUBITAL   Final   Special Requests BOTTLES DRAWN AEROBIC AND ANAEROBIC  12CC   Final   Culture NO GROWTH 5 DAYS   Final   Report Status 03/19/2014 FINAL   Final  MRSA PCR SCREENING     Status: None   Collection Time    03/15/14  1:09 AM      Result Value Ref Range Status   MRSA by PCR NEGATIVE  NEGATIVE Final   Comment:            The GeneXpert MRSA Assay (FDA     approved for NASAL specimens     only), is one component of a     comprehensive MRSA colonization     surveillance program. It is not     intended to diagnose MRSA     infection nor to guide or     monitor treatment for     MRSA infections.  URINE CULTURE     Status: None   Collection Time    03/15/14  1:19 AM      Result Value Ref Range Status   Specimen Description URINE, CLEAN CATCH   Final   Special Requests NONE   Final   Culture  Setup Time     Final   Value: 03/15/2014 19:51     Performed at [place]     Final   Value: NO GROWTH     Performed at [facility]   Culture     Final   Value: NO GROWTH     Performed at [facility]   Report Status 03/16/2014 FINAL   Final     Hospital Course: This is a 63 year old who has long-standing history of severe end-stage COPD. He has been in and out of the hospital on multiple occasions but has done better recently. He is being evaluated for potential lung transplant at [facility] He came to the emergency department because of increasing shortness of breath and fever. He was started on IV steroids IV antibiotics inhaled bronchodilators and improved rapidly. He was back to baseline after 24 hours and ready for discharge at about 48 hours.  Discharge Exam: Blood pressure 131/76, pulse 93, temperature 97.9 F (36.6 C), temperature source Oral, resp. rate 20, height 5\' 11"  (1.803 m), weight 85.186 kg (187 lb 12.8 oz), SpO2 97.00%. He is awake and alert. His chest is clear. His heart is regular.  Disposition: Home      Discharge Instructions   Discharge patient    Complete by:  As directed   After lunch  if he is doing well  Signed: Marcellous Snarski L   03/20/2014, 8:15 AM

## 2014-03-22 ENCOUNTER — Encounter (HOSPITAL_COMMUNITY): Payer: Medicare Other

## 2014-03-24 ENCOUNTER — Encounter (HOSPITAL_COMMUNITY): Payer: Medicare Other

## 2014-03-29 ENCOUNTER — Encounter (HOSPITAL_COMMUNITY): Payer: Medicare Other

## 2014-03-31 ENCOUNTER — Encounter (HOSPITAL_COMMUNITY): Payer: Medicare Other

## 2014-04-01 ENCOUNTER — Encounter: Payer: Self-pay | Admitting: Gastroenterology

## 2014-04-05 ENCOUNTER — Encounter (HOSPITAL_COMMUNITY): Payer: Medicare Other

## 2014-04-07 ENCOUNTER — Encounter (HOSPITAL_COMMUNITY): Payer: Self-pay | Admitting: Emergency Medicine

## 2014-04-07 ENCOUNTER — Inpatient Hospital Stay (HOSPITAL_COMMUNITY)
Admission: EM | Admit: 2014-04-07 | Discharge: 2014-04-10 | DRG: 192 | Disposition: A | Discharge status: Home Health | Payer: Medicare Other | Attending: Pulmonary Disease | Admitting: Pulmonary Disease

## 2014-04-07 ENCOUNTER — Encounter (HOSPITAL_COMMUNITY): Payer: Medicare Other

## 2014-04-07 ENCOUNTER — Emergency Department (HOSPITAL_COMMUNITY): Payer: Medicare Other

## 2014-04-07 DIAGNOSIS — R000 Tachycardia, unspecified: Secondary | ICD-10-CM

## 2014-04-07 DIAGNOSIS — M79609 Pain in unspecified limb: Secondary | ICD-10-CM | POA: Diagnosis present

## 2014-04-07 DIAGNOSIS — R12 Heartburn: Secondary | ICD-10-CM | POA: Diagnosis present

## 2014-04-07 DIAGNOSIS — Z9981 Dependence on supplemental oxygen: Secondary | ICD-10-CM | POA: Diagnosis not present

## 2014-04-07 DIAGNOSIS — J441 Chronic obstructive pulmonary disease with (acute) exacerbation: Secondary | ICD-10-CM | POA: Diagnosis present

## 2014-04-07 DIAGNOSIS — Z85528 Personal history of other malignant neoplasm of kidney: Secondary | ICD-10-CM | POA: Diagnosis not present

## 2014-04-07 DIAGNOSIS — I10 Essential (primary) hypertension: Secondary | ICD-10-CM | POA: Diagnosis present

## 2014-04-07 DIAGNOSIS — Z905 Acquired absence of kidney: Secondary | ICD-10-CM | POA: Diagnosis not present

## 2014-04-07 DIAGNOSIS — I2510 Atherosclerotic heart disease of native coronary artery without angina pectoris: Secondary | ICD-10-CM | POA: Diagnosis present

## 2014-04-07 DIAGNOSIS — M25569 Pain in unspecified knee: Secondary | ICD-10-CM

## 2014-04-07 DIAGNOSIS — Z800 Family history of malignant neoplasm of digestive organs: Secondary | ICD-10-CM | POA: Diagnosis not present

## 2014-04-07 DIAGNOSIS — R0602 Shortness of breath: Secondary | ICD-10-CM | POA: Diagnosis present

## 2014-04-07 DIAGNOSIS — Z8249 Family history of ischemic heart disease and other diseases of the circulatory system: Secondary | ICD-10-CM

## 2014-04-07 DIAGNOSIS — IMO0001 Reserved for inherently not codable concepts without codable children: Secondary | ICD-10-CM | POA: Diagnosis present

## 2014-04-07 DIAGNOSIS — M25469 Effusion, unspecified knee: Secondary | ICD-10-CM | POA: Diagnosis present

## 2014-04-07 DIAGNOSIS — Z87891 Personal history of nicotine dependence: Secondary | ICD-10-CM | POA: Diagnosis not present

## 2014-04-07 DIAGNOSIS — M797 Fibromyalgia: Secondary | ICD-10-CM | POA: Diagnosis present

## 2014-04-07 DIAGNOSIS — F411 Generalized anxiety disorder: Secondary | ICD-10-CM | POA: Diagnosis present

## 2014-04-07 DIAGNOSIS — M25562 Pain in left knee: Secondary | ICD-10-CM

## 2014-04-07 HISTORY — DX: Essential (primary) hypertension: I10

## 2014-04-07 HISTORY — DX: Chronic obstructive pulmonary disease, unspecified: J44.9

## 2014-04-07 HISTORY — DX: Malignant neoplasm of unspecified kidney, except renal pelvis: C64.9

## 2014-04-07 HISTORY — DX: Atherosclerotic heart disease of native coronary artery without angina pectoris: I25.10

## 2014-04-07 LAB — CBC WITH DIFFERENTIAL/PLATELET
BASOS ABS: 0 10*3/uL (ref 0.0–0.1)
Basophils Relative: 0 % (ref 0–1)
EOS PCT: 3 % (ref 0–5)
Eosinophils Absolute: 0.3 10*3/uL (ref 0.0–0.7)
HEMATOCRIT: 42.2 % (ref 39.0–52.0)
HEMOGLOBIN: 13.9 g/dL (ref 13.0–17.0)
LYMPHS ABS: 1.8 10*3/uL (ref 0.7–4.0)
LYMPHS PCT: 18 % (ref 12–46)
MCH: 30.8 pg (ref 26.0–34.0)
MCHC: 32.9 g/dL (ref 30.0–36.0)
MCV: 93.4 fL (ref 78.0–100.0)
MONO ABS: 0.8 10*3/uL (ref 0.1–1.0)
Monocytes Relative: 8 % (ref 3–12)
NEUTROS ABS: 7.2 10*3/uL (ref 1.7–7.7)
Neutrophils Relative %: 71 % (ref 43–77)
Platelets: 265 10*3/uL (ref 150–400)
RBC: 4.52 MIL/uL (ref 4.22–5.81)
RDW: 15.3 % (ref 11.5–15.5)
WBC: 10.1 10*3/uL (ref 4.0–10.5)

## 2014-04-07 LAB — BASIC METABOLIC PANEL
ANION GAP: 8 (ref 5–15)
BUN: 19 mg/dL (ref 6–23)
CO2: 44 meq/L — AB (ref 19–32)
Calcium: 9.4 mg/dL (ref 8.4–10.5)
Chloride: 90 mEq/L — ABNORMAL LOW (ref 96–112)
Creatinine, Ser: 1.4 mg/dL — ABNORMAL HIGH (ref 0.50–1.35)
GFR calc Af Amer: 60 mL/min — ABNORMAL LOW (ref >90)
GFR calc non Af Amer: 52 mL/min — ABNORMAL LOW (ref >90)
GLUCOSE: 112 mg/dL — AB (ref 70–99)
Potassium: 4.3 mEq/L (ref 3.7–5.3)
SODIUM: 142 meq/L (ref 137–147)

## 2014-04-07 LAB — BLOOD GAS, ARTERIAL
ACID-BASE EXCESS: 11.9 mmol/L — AB (ref 0.0–2.0)
Bicarbonate: 37.5 mEq/L — ABNORMAL HIGH (ref 20.0–24.0)
DRAWN BY: 21310
O2 Content: 3.5 L/min
O2 SAT: 97.1 %
Patient temperature: 37
TCO2: 33.2 mmol/L (ref 0–100)
pCO2 arterial: 65.7 mmHg (ref 35.0–45.0)
pH, Arterial: 7.375 (ref 7.350–7.450)
pO2, Arterial: 91.9 mmHg (ref 80.0–100.0)

## 2014-04-07 LAB — D-DIMER, QUANTITATIVE: D-Dimer, Quant: 0.7 ug/mL-FEU — ABNORMAL HIGH (ref 0.00–0.48)

## 2014-04-07 LAB — TROPONIN I: Troponin I: <0.3 ng/mL (ref <0.30)

## 2014-04-07 LAB — URIC ACID: URIC ACID, SERUM: 8.1 mg/dL — AB (ref 4.0–7.8)

## 2014-04-07 MED ORDER — FUROSEMIDE 40 MG PO TABS: 40.0000 mg | ORAL_TABLET | Freq: Every day | ORAL | Status: DC | PRN

## 2014-04-07 MED ORDER — SODIUM CHLORIDE 0.9 % IJ SOLN
3.0000 mL | Freq: Two times a day (BID) | INTRAMUSCULAR | Status: DC
  Administered 2014-04-08 – 2014-04-09 (×3): 3 mL via INTRAVENOUS

## 2014-04-07 MED ORDER — HYDROCODONE-ACETAMINOPHEN 5-325 MG PO TABS: 1.0000 | ORAL_TABLET | ORAL | Status: DC | PRN

## 2014-04-07 MED ORDER — IPRATROPIUM-ALBUTEROL 0.5-2.5 (3) MG/3ML IN SOLN
3.0000 mL | Freq: Once | RESPIRATORY_TRACT | Status: AC
  Administered 2014-04-07: 3 mL via RESPIRATORY_TRACT
  Filled 2014-04-07: qty 3

## 2014-04-07 MED ORDER — METHYLPREDNISOLONE SODIUM SUCC 125 MG IJ SOLR
60.0000 mg | Freq: Four times a day (QID) | INTRAMUSCULAR | Status: DC
  Administered 2014-04-08 – 2014-04-10 (×10): 60 mg via INTRAVENOUS
  Filled 2014-04-07 (×10): qty 2

## 2014-04-07 MED ORDER — HYDRALAZINE HCL 25 MG PO TABS
25.0000 mg | ORAL_TABLET | Freq: Four times a day (QID) | ORAL | Status: DC | PRN
Start: 2014-04-07 — End: 2014-04-10

## 2014-04-07 MED ORDER — METHYLPREDNISOLONE SODIUM SUCC 125 MG IJ SOLR
125.0000 mg | Freq: Once | INTRAMUSCULAR | Status: AC
  Administered 2014-04-07: 125 mg via INTRAVENOUS
  Filled 2014-04-07: qty 2

## 2014-04-07 MED ORDER — IPRATROPIUM BROMIDE 0.02 % IN SOLN
0.5000 mg | Freq: Four times a day (QID) | RESPIRATORY_TRACT | Status: DC
  Administered 2014-04-08 – 2014-04-10 (×11): 0.5 mg via RESPIRATORY_TRACT
  Filled 2014-04-07 (×11): qty 2.5

## 2014-04-07 MED ORDER — MILNACIPRAN HCL 50 MG PO TABS
50.0000 mg | ORAL_TABLET | Freq: Two times a day (BID) | ORAL | Status: DC
  Administered 2014-04-08 – 2014-04-10 (×6): 50 mg via ORAL
  Filled 2014-04-07 (×10): qty 1

## 2014-04-07 MED ORDER — ROFLUMILAST 500 MCG PO TABS
500.0000 ug | ORAL_TABLET | Freq: Every day | ORAL | Status: DC
  Administered 2014-04-08 – 2014-04-10 (×3): 500 ug via ORAL
  Filled 2014-04-07 (×8): qty 1

## 2014-04-07 MED ORDER — SODIUM CHLORIDE 0.9 % IJ SOLN: 3.0000 mL | INTRAMUSCULAR | Status: DC | PRN

## 2014-04-07 MED ORDER — FLUTICASONE FUROATE-VILANTEROL 100-25 MCG/INH IN AEPB: 1.0000 | INHALATION_SPRAY | Freq: Every day | RESPIRATORY_TRACT | Status: DC

## 2014-04-07 MED ORDER — BUSPIRONE HCL 5 MG PO TABS
5.0000 mg | ORAL_TABLET | Freq: Three times a day (TID) | ORAL | Status: DC
  Administered 2014-04-08 – 2014-04-10 (×8): 5 mg via ORAL
  Filled 2014-04-07 (×8): qty 1

## 2014-04-07 MED ORDER — LEVOFLOXACIN IN D5W 500 MG/100ML IV SOLN
500.0000 mg | INTRAVENOUS | Status: DC
  Administered 2014-04-08 – 2014-04-10 (×3): 500 mg via INTRAVENOUS
  Filled 2014-04-07 (×5): qty 100

## 2014-04-07 MED ORDER — CYCLOBENZAPRINE HCL 10 MG PO TABS
10.0000 mg | ORAL_TABLET | Freq: Two times a day (BID) | ORAL | Status: DC
  Administered 2014-04-08 – 2014-04-10 (×6): 10 mg via ORAL
  Filled 2014-04-07 (×6): qty 1

## 2014-04-07 MED ORDER — GABAPENTIN 300 MG PO CAPS
300.0000 mg | ORAL_CAPSULE | Freq: Two times a day (BID) | ORAL | Status: DC
  Administered 2014-04-08 – 2014-04-10 (×6): 300 mg via ORAL
  Filled 2014-04-07 (×6): qty 1

## 2014-04-07 MED ORDER — ALBUTEROL SULFATE (2.5 MG/3ML) 0.083% IN NEBU
2.5000 mg | INHALATION_SOLUTION | Freq: Once | RESPIRATORY_TRACT | Status: AC
  Administered 2014-04-07: 2.5 mg via RESPIRATORY_TRACT
  Filled 2014-04-07: qty 3

## 2014-04-07 MED ORDER — SODIUM CHLORIDE 0.9 % IJ SOLN
3.0000 mL | Freq: Two times a day (BID) | INTRAMUSCULAR | Status: DC
  Administered 2014-04-10: 3 mL via INTRAVENOUS

## 2014-04-07 MED ORDER — DM-GUAIFENESIN ER 30-600 MG PO TB12
1.0000 | ORAL_TABLET | Freq: Two times a day (BID) | ORAL | Status: DC
  Administered 2014-04-08 – 2014-04-10 (×6): 1 via ORAL
  Filled 2014-04-07 (×6): qty 1

## 2014-04-07 MED ORDER — SODIUM CHLORIDE 0.9 % IV SOLN: 250.0000 mL | INTRAVENOUS | Status: DC | PRN

## 2014-04-07 MED ORDER — LEVALBUTEROL HCL 0.63 MG/3ML IN NEBU
0.6300 mg | INHALATION_SOLUTION | Freq: Four times a day (QID) | RESPIRATORY_TRACT | Status: DC
  Administered 2014-04-08 – 2014-04-10 (×11): 0.63 mg via RESPIRATORY_TRACT
  Filled 2014-04-07 (×11): qty 3

## 2014-04-07 MED ORDER — ENOXAPARIN SODIUM 100 MG/ML ~~LOC~~ SOLN
1.0000 mg/kg | Freq: Once | SUBCUTANEOUS | Status: AC
  Administered 2014-04-07: 85 mg via SUBCUTANEOUS
  Filled 2014-04-07: qty 1

## 2014-04-07 NOTE — H&P (Signed)
PCP:   Alonza Bogus, MD   Chief Complaint:  Shortness of breath, knee pain  HPI: 63 year old male who   has a past medical history of COPD (chronic obstructive pulmonary disease); Fibromyalgia; Shingles; Cancer; Helicobacter pylori gastritis (March 2014); Candida esophagitis (March 2014); On supplemental oxygen therapy; and Hypertension. Today presents to the ED after patient had worsening shortness of breath, and developed left knee pain. As per patient he has severe COPD and is on home oxygen. Patient is also on waiting list for lung transplant. This morning he noted that he had left knee pain which was radiating to upper thigh and lower leg. He denies trauma to the knee. Denies history of gout or osteoarthritis. Patient also has had worsening shortness of breath, and was coughing up yellow phlegm. He checked his temperature in the afternoon and it was 10 82F. He denies nausea vomiting or diarrhea. No dysuria urgency or frequency of urination. In the ED patient was found to have elevated d-dimer 0.70, and has been given one dose of Lovenox for possible DVT. Ultrasound of the lower extremity has been ordered. Chest x-ray shows chronic changes of emphysema with fibrosis, and no acute changes.   Allergies:   Allergies  Allergen Reactions  . Morphine And Related Other (See Comments)    Paralysis, "shuts down kidney and bowel tract"   . Other     All narcotics cause paralysis, and shuts down kidney and bowel tract       Past Medical History  Diagnosis Date  . COPD (chronic obstructive pulmonary disease)   . Fibromyalgia   . Shingles   . Cancer     kidney  . Helicobacter pylori gastritis March 2014  . Candida esophagitis March 2014  . On supplemental oxygen therapy     3L/min and on BPAP @ HS  . Hypertension     "he doesnt take medication anymore because no longer has high blood pressure" per family    Past Surgical History  Procedure Laterality Date  . Hernia repair      . Thyroid surgery    . Parathyroid surgery      benign tumor  . Partial nephrectomy  2005    right  . Flexible sigmoidoscopy  09/27/2012    ERD:EYCXKGYJEHUDJS/HFWYOVZCHYI  . Esophagogastroduodenoscopy (egd) with esophageal dilation N/A 11/21/2012    SLF: DYSPHAGIA MOST LIKELY DUE TO CANDIDA ESOPHAGITIS/Small hiatal hernia/ MODERATE Non-erosive gastritis with +H.PYLORI ON PATH  . Colonoscopy N/A 01/20/2013    SLF:9 COLON POLYPS REMOVED/MILD diverticulosis in the descending colon and sigmoid colon/MODERATE INTERNAL HEMORRHOIDS  . Esophagogastroduodenoscopy (egd) with propofol N/A 01/27/2013    FOY:DXAJ WHITE PLAQUES IN THE ESOPHAGUS.  BRUSH BIOPSIES OBTAINED/Non-erosive gastritis (inflammation) was found in the gastric antrum; multiple bx/  . Savory dilation N/A 01/27/2013    Procedure: SAVORY DILATION;  Surgeon: Danie Binder, MD;  Location: [facility];  Service: Endoscopy;  Laterality: N/A;  . Esophageal biopsy N/A 01/27/2013    Procedure: BIOPSY;  Surgeon: Danie Binder, MD;  Location: [facility];  Service: Endoscopy;  Laterality: N/A;  Gastric Biopsies    Prior to Admission medications   Medication Sig Start Date End Date Taking? Authorizing Provider  albuterol (PROVENTIL HFA;VENTOLIN HFA) 108 (90 BASE) MCG/ACT inhaler Inhale 2 puffs into the lungs every 6 (six) hours as needed. For shortness of breath    Yes Historical Provider, MD  albuterol (PROVENTIL) (2.5 MG/3ML) 0.083% nebulizer solution Take 2.5 mg by nebulization 3 (three) times daily as  needed for wheezing or shortness of breath.  12/14/11  Yes Alonza Bogus, MD  busPIRone (BUSPAR) 5 MG tablet Take 1 tablet (5 mg total) by mouth 3 (three) times daily. 05/22/13  Yes Alonza Bogus, MD  cyclobenzaprine (FLEXERIL) 10 MG tablet Take 10 mg by mouth 2 (two) times daily.    Yes Historical Provider, MD  Fluticasone Furoate-Vilanterol (BREO ELLIPTA) 100-25 MCG/INH AEPB Inhale 1 puff into the lungs daily.    Yes Historical Provider, MD  furosemide  (LASIX) 40 MG tablet Take 40 mg by mouth daily as needed for fluid.    Yes Historical Provider, MD  gabapentin (NEURONTIN) 300 MG capsule Take 300 mg by mouth 2 (two) times daily.    Yes Historical Provider, MD  Milnacipran (SAVELLA) 50 MG TABS Take 50 mg by mouth 2 (two) times daily.    Yes Historical Provider, MD  predniSONE (DELTASONE) 10 MG tablet Take 1 tablet (10 mg total) by mouth daily. 03/16/14  Yes Alonza Bogus, MD  pseudoephedrine-guaifenesin [facility] D) 60-600 MG per tablet Take 1 tablet by mouth every 12 (twelve) hours.   Yes Historical Provider, MD  roflumilast (DALIRESP) 500 MCG TABS tablet Take 1 tablet (500 mcg total) by mouth daily. 12/14/11  Yes Alonza Bogus, MD  tiotropium (SPIRIVA) 18 MCG inhalation capsule Place 18 mcg into inhaler and inhale daily.     Yes Historical Provider, MD    Social History:  reports that he quit smoking about 18 months ago. His smoking use included Cigarettes. He has a 63 pack-year smoking history. He has quit using smokeless tobacco. He reports that he does not drink alcohol or use illicit drugs.  Family History  Problem Relation Age of Onset  . Hypertension Mother   . Colon cancer Paternal Grandmother      All the positives are listed in BOLD  Review of Systems:  HEENT: Headache, blurred vision, runny nose, sore throat Neck: Hypothyroidism, hyperthyroidism,,lymphadenopathy Chest : Shortness of breath, history of COPD, Asthma Heart : Chest pain, history of coronary arterey disease GI:  Nausea, vomiting, diarrhea, constipation, GERD GU: Dysuria, urgency, frequency of urination, hematuria Neuro: Stroke, seizures, syncope Psych: Depression, anxiety, hallucinations   Physical Exam: Blood pressure 135/81, pulse 114, temperature 99.2 F (37.3 C), temperature source Oral, resp. rate 21, height 5\' 11"  (1.803 m), weight 83.462 kg (184 lb), SpO2 100.00%. Constitutional:   Patient is a well-developed and well-nourished male* in no acute  distress and cooperative with exam. Head: Normocephalic and atraumatic Mouth: Mucus membranes moist Eyes: PERRL, EOMI, conjunctivae normal Neck: Supple, No Thyromegaly Cardiovascular: RRR, S1 normal, S2 normal Pulmonary/Chest: Decreased breath sounds bilaterally, bilateral wheezing Abdominal: Soft. Non-tender, non-distended, bowel sounds are normal, no masses, organomegaly, or guarding present.  Neurological: A&O x3, Strenght is normal and symmetric bilaterally, cranial nerve II-XII are grossly intact, no focal motor deficit, sensory intact to light touch bilaterally.  Extremities : No edema of the lower extremities Left knee- swelling, warmth, tenderness to palpation in the left knee. Pain also elicited in upper thigh and left calf muscles. Homans sign positive  Labs on Admission:  Basic Metabolic Panel:  Recent Labs Lab 04/07/14 2107  NA 142  K 4.3  CL 90*  CO2 44*  GLUCOSE 112*  BUN 19  CREATININE 1.40*  CALCIUM 9.4   CBC:  Recent Labs Lab 04/07/14 2107  WBC 10.1  NEUTROABS 7.2  HGB 13.9  HCT 42.2  MCV 93.4  PLT 265   Cardiac Enzymes:  Recent Labs Lab 04/07/14 2107  TROPONINI <0.30    BNP (last 3 results) No results found for this basename: PROBNP,  in the last 8760 hours CBG: No results found for this basename: GLUCAP,  in the last 168 hours  Radiological Exams on Admission: Dg Chest 2 View  04/07/2014   CLINICAL DATA:  Shortness of breath.  EXAM: CHEST  2 VIEW  COMPARISON:  03/14/2014  FINDINGS: Normal heart size. Hilar structures are prominent which may suggest arterial hypertension. Prominent emphysematous changes throughout the lungs with large bullous changes in the right upper lung and left upper and lower lungs. Fibrosis in the lungs. No focal airspace disease or consolidation. No pneumothorax. No blunting of costophrenic angles. Surgical clips in the base of the neck. No change since prior study.  IMPRESSION: Emphysematous changes and fibrosis in the  lungs with prominent hilar structures possibly indicating pulmonary arterial hypertension. No evidence of active pulmonary disease. No change since prior study.   Electronically Signed   By: Lucienne Capers M.D.   On: 04/07/2014 21:49       Assessment/Plan Principal Problem:   COPD exacerbation Active Problems:   Hypertension   Fibromyalgia   Anxiety state, unspecified   Acute pain of left knee  COPD exacerbation Patient has COPD exacerbation, will start Solu-Medrol 60 mg IV every 6 hours, Xopenex and Atrovent nebulizers scheduled every 6 hours. Levaquin 500 mg IV daily, Mucinex DM one tablet by mouth twice a day.  Left knee pain ? Gout, versus osteoarthritis. Will obtain x-ray of left knee, uric acid level. Might benefit from arthrocentesis and analysis of joint fluid if x-ray and uric acid are inconclusive. Vicodin when necessary for pain.  Elevated d-dimer, left leg pain Concern for possible DVT, one dose of Lovenox has been given, venous Doppler ultrasound ordered for tomorrow morning. Please follow the results  [place].   Anxiety Continue BuSpar 3 times a day  Hypertension We'll start the patient on hydralazine 25 mg by mouth every 6 hours when necessary for BP greater than 160/100. Will discontinue Mucinex D. which contains pseudoephedrine, which could be raising the patient's blood pressure.    Code status:Full code  Family discussion: Admission, patients condition and plan of care including tests being ordered have been discussed with the patient and wife who indicate understanding and agree with the plan and Code Status.   Time Spent on Admission: 60 min  [facility] Pager: [phone] 04/07/2014, 10:37 PM  If 7PM-7AM, please contact night-coverage  [email or web address]  Password [email or web address]

## 2014-04-07 NOTE — ED Notes (Signed)
CRITICAL VALUE ALERT  Critical value received:  CO2 of 44  Date of notification:  04/07/14  Time of notification:  2157  Critical value read back: Yes  Nurse who received alert:  Elwanda Brooklyn, RN  MD notified:  Dr. Lacinda Axon  Time of notification:  2158

## 2014-04-07 NOTE — ED Notes (Signed)
Patient has copd, states that today shortness of breath has become worse and also having left knee pain

## 2014-04-07 NOTE — ED Notes (Signed)
MD at bedside. 

## 2014-04-07 NOTE — ED Provider Notes (Signed)
CSN: [id number]     Arrival date & time 04/07/14  2009 History   First MD Initiated Contact with Patient 04/07/14 2022     Chief Complaint  Patient presents with  . Shortness of Breath  . Knee Pain     (Consider location/radiation/quality/duration/timing/severity/associated sxs/prior Treatment) HPI .Marland Kitchen.. patient with known severe COPD on home oxygen 3 L per minute and BiPAP at night presents with worsening shortness of breath for the past day. He has felt achy and febrile. He currently takes 10 mg of prednisone daily, nebulizer treatments at home and albuterol inhaler. He is on the lung transplant list at [facility]. 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 Additionally patient complains of pain behind his right knee. No trauma   Past Medical History  Diagnosis Date  . COPD (chronic obstructive pulmonary disease)   . Fibromyalgia   . Shingles   . Cancer     kidney  . Helicobacter pylori gastritis March 2014  . Candida esophagitis March 2014  . On supplemental oxygen therapy     3L/min and on BPAP @ HS  . Hypertension     "he doesnt take medication anymore because no longer has high blood pressure" per family   Past Surgical History  Procedure Laterality Date  . Hernia repair    . Thyroid surgery    . Parathyroid surgery      benign tumor  . Partial nephrectomy  2005    right  . Flexible sigmoidoscopy  09/27/2012    YQM:[id number]  . Esophagogastroduodenoscopy (egd) with esophageal dilation N/A 11/21/2012    SLF: DYSPHAGIA MOST LIKELY DUE TO CANDIDA ESOPHAGITIS/Small hiatal hernia/ MODERATE Non-erosive gastritis with +H.PYLORI ON PATH  . Colonoscopy N/A 01/20/2013    SLF:9 COLON POLYPS REMOVED/MILD diverticulosis in the descending colon and sigmoid colon/MODERATE INTERNAL HEMORRHOIDS  . Esophagogastroduodenoscopy (egd) with propofol N/A 01/27/2013    DGU:YQIH WHITE PLAQUES IN THE ESOPHAGUS.  BRUSH  BIOPSIES OBTAINED/Non-erosive gastritis (inflammation) was found in the gastric antrum; multiple bx/  . Savory dilation N/A 01/27/2013    Procedure: SAVORY DILATION;  Surgeon: Danie Binder, MD;  Location: [facility];  Service: Endoscopy;  Laterality: N/A;  . Esophageal biopsy N/A 01/27/2013    Procedure: BIOPSY;  Surgeon: Danie Binder, MD;  Location: [facility];  Service: Endoscopy;  Laterality: N/A;  Gastric Biopsies   Family History  Problem Relation Age of Onset  . Hypertension Mother   . Colon cancer Paternal Grandmother    History  Substance Use Topics  . Smoking status: Former Smoker -- 1.50 packs/day for 42 years    Types: Cigarettes    Quit date: 09/17/2012  . Smokeless tobacco: Former Systems developer  . Alcohol Use: No     Comment: no ETOH in 30 years     Review of Systems  All other systems reviewed and are negative.     Allergies  Morphine and related and Other  Home Medications   Prior to Admission medications   Medication Sig Start Date End Date Taking? Authorizing Provider  albuterol (PROVENTIL HFA;VENTOLIN HFA) 108 (90 BASE) MCG/ACT inhaler Inhale 2 puffs into the lungs every 6 (six) hours as needed. For shortness of breath    Yes Historical Provider, MD  albuterol (PROVENTIL) (2.5 MG/3ML) 0.083% nebulizer solution Take 2.5 mg by nebulization 3 (three) times daily as needed for wheezing or shortness of breath.  12/14/11  Yes Alonza Bogus, MD  busPIRone (BUSPAR)  5 MG tablet Take 1 tablet (5 mg total) by mouth 3 (three) times daily. 05/22/13  Yes Alonza Bogus, MD  cyclobenzaprine (FLEXERIL) 10 MG tablet Take 10 mg by mouth 2 (two) times daily.    Yes Historical Provider, MD  Fluticasone Furoate-Vilanterol (BREO ELLIPTA) 100-25 MCG/INH AEPB Inhale 1 puff into the lungs daily.    Yes Historical Provider, MD  furosemide (LASIX) 40 MG tablet Take 40 mg by mouth daily as needed for fluid.    Yes Historical Provider, MD  gabapentin (NEURONTIN) 300 MG capsule Take 300 mg by mouth 2  (two) times daily.    Yes Historical Provider, MD  Milnacipran (SAVELLA) 50 MG TABS Take 50 mg by mouth 2 (two) times daily.    Yes Historical Provider, MD  predniSONE (DELTASONE) 10 MG tablet Take 1 tablet (10 mg total) by mouth daily. 03/16/14  Yes Alonza Bogus, MD  pseudoephedrine-guaifenesin [facility] D) 60-600 MG per tablet Take 1 tablet by mouth every 12 (twelve) hours.   Yes Historical Provider, MD  roflumilast (DALIRESP) 500 MCG TABS tablet Take 1 tablet (500 mcg total) by mouth daily. 12/14/11  Yes Alonza Bogus, MD  tiotropium (SPIRIVA) 18 MCG inhalation capsule Place 18 mcg into inhaler and inhale daily.     Yes Historical Provider, MD   BP 159/103  Pulse 119  Temp(Src) 98.5 F (36.9 C) (Oral)  Resp 25  Ht 5\' 11"  (1.803 m)  Wt 184 lb (83.462 kg)  BMI 25.67 kg/m2  SpO2 97% Physical Exam  Nursing note and vitals reviewed. Constitutional: He is oriented to person, place, and time.  Tachycardic, slightly dyspneic  HENT:  Head: Normocephalic and atraumatic.  Eyes: Conjunctivae and EOM are normal. Pupils are equal, round, and reactive to light.  Neck: Normal range of motion. Neck supple.  Cardiovascular: Normal rate, regular rhythm and normal heart sounds.   Pulmonary/Chest: Effort normal and breath sounds normal.  Abdominal: Soft. Bowel sounds are normal.  Musculoskeletal:  Minimal tenderness in left popliteal area   Neurological: He is alert and oriented to person, place, and time.  Skin: Skin is warm and dry.  Psychiatric: He has a normal mood and affect. His behavior is normal.    ED Course  Procedures (including critical care time) Labs Review Labs Reviewed  BASIC METABOLIC PANEL - Abnormal; Notable for the following:    Chloride 90 (*)    Glucose, Bld 112 (*)    Creatinine, Ser 1.40 (*)    GFR calc non Af Amer 52 (*)    GFR calc Af Amer 60 (*)    All other components within normal limits  D-DIMER, QUANTITATIVE - Abnormal; Notable for the following:     D-Dimer, Quant 0.70 (*)    All other components within normal limits  CBC WITH DIFFERENTIAL  TROPONIN I    Imaging Review No results found.   EKG Interpretation   Date/Time:  Wednesday April 07 2014 20:57:34 EDT Ventricular Rate:  126 PR Interval:  127 QRS Duration: 142 QT Interval:  355 QTC Calculation: 514 R Axis:   79 Text Interpretation:  Sinus tachycardia Multiple premature complexes, vent   Nonspecific intraventricular conduction delay ST depr, consider ischemia,  inferior leads Baseline wander in lead(s) V3 Confirmed by Tanicia Wolaver  MD, Muhammadali Ries  ([id number]) on 04/07/2014 9:18:09 PM      MDM   Final diagnoses:  COPD exacerbation    Patient has known COPD with exacerbation. IV site Medrol, albuterol/Atrovent nebulizer. Chest x-ray shows  no acute pneumonia.  Subcutaneous Lovenox 1 mg per kilogram given for left popliteal tenderness. Recommend Doppler study in a.m.   Admit to general medicine.    Nat Christen, MD 04/07/14 2208

## 2014-04-08 ENCOUNTER — Inpatient Hospital Stay (HOSPITAL_COMMUNITY): Payer: Medicare Other

## 2014-04-08 ENCOUNTER — Encounter (HOSPITAL_COMMUNITY): Payer: Self-pay | Admitting: Cardiology

## 2014-04-08 DIAGNOSIS — I2510 Atherosclerotic heart disease of native coronary artery without angina pectoris: Secondary | ICD-10-CM

## 2014-04-08 LAB — COMPREHENSIVE METABOLIC PANEL
ALT: 19 U/L (ref 0–53)
ANION GAP: 11 (ref 5–15)
AST: 18 U/L (ref 0–37)
Albumin: 3.3 g/dL — ABNORMAL LOW (ref 3.5–5.2)
Alkaline Phosphatase: 95 U/L (ref 39–117)
BUN: 18 mg/dL (ref 6–23)
CALCIUM: 9.3 mg/dL (ref 8.4–10.5)
CO2: 36 meq/L — AB (ref 19–32)
CREATININE: 1.18 mg/dL (ref 0.50–1.35)
Chloride: 94 mEq/L — ABNORMAL LOW (ref 96–112)
GFR calc Af Amer: 74 mL/min — ABNORMAL LOW (ref >90)
GFR, EST NON AFRICAN AMERICAN: 64 mL/min — AB (ref >90)
Glucose, Bld: 189 mg/dL — ABNORMAL HIGH (ref 70–99)
Potassium: 4.1 mEq/L (ref 3.7–5.3)
Sodium: 141 mEq/L (ref 137–147)
Total Bilirubin: 0.2 mg/dL — ABNORMAL LOW (ref 0.3–1.2)
Total Protein: 7.2 g/dL (ref 6.0–8.3)

## 2014-04-08 LAB — CBC
HCT: 40.7 % (ref 39.0–52.0)
Hemoglobin: 13.5 g/dL (ref 13.0–17.0)
MCH: 30.5 pg (ref 26.0–34.0)
MCHC: 33.2 g/dL (ref 30.0–36.0)
MCV: 91.9 fL (ref 78.0–100.0)
Platelets: 240 10*3/uL (ref 150–400)
RBC: 4.43 MIL/uL (ref 4.22–5.81)
RDW: 15.2 % (ref 11.5–15.5)
WBC: 9.8 10*3/uL (ref 4.0–10.5)

## 2014-04-08 LAB — LIPID PANEL
CHOLESTEROL: 208 mg/dL — AB (ref 0–200)
HDL: 97 mg/dL (ref >39)
LDL Cholesterol: 101 mg/dL — ABNORMAL HIGH (ref 0–99)
Total CHOL/HDL Ratio: 2.1 RATIO
Triglycerides: 48 mg/dL (ref <150)
VLDL: 10 mg/dL (ref 0–40)

## 2014-04-08 MED ORDER — METHYLPREDNISOLONE ACETATE 40 MG/ML IJ SUSP
40.0000 mg | Freq: Once | INTRAMUSCULAR | Status: AC
  Filled 2014-04-08: qty 1

## 2014-04-08 MED ORDER — LIDOCAINE HCL (PF) 1 % IJ SOLN: 5.0000 mL | Freq: Once | INTRAMUSCULAR | Status: AC

## 2014-04-08 MED ORDER — LIDOCAINE HCL (PF) 1 % IJ SOLN
INTRAMUSCULAR | Status: AC
  Administered 2014-04-08: 5 mL
  Filled 2014-04-08: qty 5

## 2014-04-08 MED ORDER — LEVOFLOXACIN IN D5W 500 MG/100ML IV SOLN
INTRAVENOUS | Status: AC
  Filled 2014-04-08: qty 100

## 2014-04-08 MED ORDER — ASPIRIN 81 MG PO CHEW
81.0000 mg | CHEWABLE_TABLET | Freq: Once | ORAL | Status: AC
  Administered 2014-04-08: 81 mg via ORAL
  Filled 2014-04-08: qty 1

## 2014-04-08 MED ORDER — CETYLPYRIDINIUM CHLORIDE 0.05 % MT LIQD
7.0000 mL | Freq: Two times a day (BID) | OROMUCOSAL | Status: DC
  Administered 2014-04-08 – 2014-04-10 (×4): 7 mL via OROMUCOSAL
  Filled 2014-04-08 (×3): qty 7

## 2014-04-08 MED ORDER — CHLORHEXIDINE GLUCONATE 0.12 % MT SOLN
15.0000 mL | Freq: Two times a day (BID) | OROMUCOSAL | Status: DC
  Administered 2014-04-08 – 2014-04-10 (×5): 15 mL via OROMUCOSAL
  Filled 2014-04-08 (×5): qty 15

## 2014-04-08 MED ORDER — MOMETASONE FURO-FORMOTEROL FUM 100-5 MCG/ACT IN AERO
2.0000 | INHALATION_SPRAY | Freq: Two times a day (BID) | RESPIRATORY_TRACT | Status: DC
  Administered 2014-04-08 – 2014-04-10 (×4): 2 via RESPIRATORY_TRACT
  Filled 2014-04-08: qty 8.8

## 2014-04-08 NOTE — Consult Note (Signed)
Reason for Consult:left knee effusion Referring Physician: [name] is an 63 y.o. male.  HPI: Patient has a several day to week history of left knee pain and swelling.  He said several days ago the left knee was very swollen and painful.  He has no fever, no chills.  He has no other joint problem but does have chronic fibromyalgia.  The left knee is much less swollen today than even yesterday he says.  It is not hurting that bad today.  He denies any fall, trauma or untoward event.  He has no giving way or buckling of the knee.  He has no distal edema.    Past Medical History  Diagnosis Date  . End stage COPD     3L/min and on BPAP @ HS  . Fibromyalgia   . Shingles   . Renal cancer   . Helicobacter pylori gastritis March 2014  . Candida esophagitis March 2014  . Essential hypertension, benign   . Coronary atherosclerosis of native coronary artery     Details not clear - evaluated at [facility]    Past Surgical History  Procedure Laterality Date  . Hernia repair    . Thyroid surgery    . Parathyroid surgery      Benign tumor  . Partial nephrectomy  2005    Right  . Flexible sigmoidoscopy  09/27/2012    XKG:YJEHUDJSHFWYOV/ZCHYIFOYDXA  . Esophagogastroduodenoscopy (egd) with esophageal dilation N/A 11/21/2012    SLF: DYSPHAGIA MOST LIKELY DUE TO CANDIDA ESOPHAGITIS/Small hiatal hernia/ MODERATE Non-erosive gastritis with +H.PYLORI ON PATH  . Colonoscopy N/A 01/20/2013    SLF:9 COLON POLYPS REMOVED/MILD diverticulosis in the descending colon and sigmoid colon/MODERATE INTERNAL HEMORRHOIDS  . Esophagogastroduodenoscopy (egd) with propofol N/A 01/27/2013    JOI:NOMV WHITE PLAQUES IN THE ESOPHAGUS.  BRUSH BIOPSIES OBTAINED/Non-erosive gastritis (inflammation) was found in the gastric antrum; multiple bx/  . Savory dilation N/A 01/27/2013    Procedure: SAVORY DILATION;  Surgeon: Danie Binder, MD;  Location: [facility];  Service: Endoscopy;  Laterality: N/A;  . Esophageal biopsy N/A  01/27/2013    Procedure: BIOPSY;  Surgeon: Danie Binder, MD;  Location: [facility];  Service: Endoscopy;  Laterality: N/A;  Gastric Biopsies    Family History  Problem Relation Age of Onset  . Hypertension Mother   . Colon cancer Paternal Grandmother     Social History:  reports that he quit smoking about 18 months ago. His smoking use included Cigarettes. He has a 63 pack-year smoking history. He has quit using smokeless tobacco. He reports that he does not drink alcohol or use illicit drugs.  Allergies:  Allergies  Allergen Reactions  . Morphine And Related Other (See Comments)    Paralysis, "shuts down kidney and bowel tract"   . Other     All narcotics cause paralysis, and shuts down kidney and bowel tract     Medications: I have reviewed the patient's current medications.  Results for orders placed during the hospital encounter of 04/07/14 (from the past 48 hour(s))  BASIC METABOLIC PANEL     Status: Abnormal   Collection Time    04/07/14  9:07 PM      Result Value Ref Range   Sodium 142  137 - 147 mEq/L   Potassium 4.3  3.7 - 5.3 mEq/L   Chloride 90 (*) 96 - 112 mEq/L   CO2 44 (*) 19 - 32 mEq/L   Comment: CRITICAL RESULT CALLED TO, READ BACK BY AND  VERIFIED WITH:     HEADLEY,J ON 04/07/14 AT 2155 BY LOY,C   Glucose, Bld 112 (*) 70 - 99 mg/dL   BUN 19  6 - 23 mg/dL   Creatinine, Ser 1.40 (*) 0.50 - 1.35 mg/dL   Calcium 9.4  8.4 - 10.5 mg/dL   GFR calc non Af Amer 52 (*) >90 mL/min   GFR calc Af Amer 60 (*) >90 mL/min   Comment: (NOTE)     The eGFR has been calculated using the CKD EPI equation.     This calculation has not been validated in all clinical situations.     eGFR's persistently <90 mL/min signify possible Chronic Kidney     Disease.   Anion gap 8  5 - 15  CBC WITH DIFFERENTIAL     Status: None   Collection Time    04/07/14  9:07 PM      Result Value Ref Range   WBC 10.1  4.0 - 10.5 K/uL   RBC 4.52  4.22 - 5.81 MIL/uL   Hemoglobin 13.9  13.0 - 17.0 g/dL    HCT 42.2  39.0 - 52.0 %   MCV 93.4  78.0 - 100.0 fL   MCH 30.8  26.0 - 34.0 pg   MCHC 32.9  30.0 - 36.0 g/dL   RDW 15.3  11.5 - 15.5 %   Platelets 265  150 - 400 K/uL   Neutrophils Relative % 71  43 - 77 %   Neutro Abs 7.2  1.7 - 7.7 K/uL   Lymphocytes Relative 18  12 - 46 %   Lymphs Abs 1.8  0.7 - 4.0 K/uL   Monocytes Relative 8  3 - 12 %   Monocytes Absolute 0.8  0.1 - 1.0 K/uL   Eosinophils Relative 3  0 - 5 %   Eosinophils Absolute 0.3  0.0 - 0.7 K/uL   Basophils Relative 0  0 - 1 %   Basophils Absolute 0.0  0.0 - 0.1 K/uL  TROPONIN I     Status: None   Collection Time    04/07/14  9:07 PM      Result Value Ref Range   Troponin I <0.30  <0.30 ng/mL   Comment:            Due to the release kinetics of cTnI,     a negative result within the first hours     of the onset of symptoms does not rule out     myocardial infarction with certainty.     If myocardial infarction is still suspected,     repeat the test at appropriate intervals.  D-DIMER, QUANTITATIVE     Status: Abnormal   Collection Time    04/07/14  9:07 PM      Result Value Ref Range   D-Dimer, Quant 0.70 (*) 0.00 - 0.48 ug/mL-FEU   Comment:            AT THE INHOUSE ESTABLISHED CUTOFF     VALUE OF 0.48 ug/mL FEU,     THIS ASSAY HAS BEEN DOCUMENTED     IN THE LITERATURE TO HAVE     A SENSITIVITY AND NEGATIVE     PREDICTIVE VALUE OF AT LEAST     98 TO 99%.  THE TEST RESULT     SHOULD BE CORRELATED WITH     AN ASSESSMENT OF THE CLINICAL     PROBABILITY OF DVT / VTE.  URIC ACID     Status:  Abnormal   Collection Time    04/07/14 11:34 PM      Result Value Ref Range   Uric Acid, Serum 8.1 (*) 4.0 - 7.8 mg/dL  BLOOD GAS, ARTERIAL     Status: Abnormal   Collection Time    04/07/14 11:40 PM      Result Value Ref Range   O2 Content 3.5     Delivery systems NASAL CANNULA     pH, Arterial 7.375  7.350 - 7.450   pCO2 arterial 65.7 (*) 35.0 - 45.0 mmHg   Comment: CRITICAL RESULT CALLED TO, READ BACK BY AND  VERIFIED WITH:     GRAY,M. RN AT 2357 04/07/14 ANDERSON,S.RRT/RCP   pO2, Arterial 91.9  80.0 - 100.0 mmHg   Bicarbonate 37.5 (*) 20.0 - 24.0 mEq/L   TCO2 33.2  0 - 100 mmol/L   Acid-Base Excess 11.9 (*) 0.0 - 2.0 mmol/L   O2 Saturation 97.1     Patient temperature 37.0     Collection site LEFT RADIAL     Drawn by [id number]     Sample type ARTERIAL     Allens test (pass/fail) PASS  PASS  CBC     Status: None   Collection Time    04/08/14  4:49 AM      Result Value Ref Range   WBC 9.8  4.0 - 10.5 K/uL   RBC 4.43  4.22 - 5.81 MIL/uL   Hemoglobin 13.5  13.0 - 17.0 g/dL   HCT 40.7  39.0 - 52.0 %   MCV 91.9  78.0 - 100.0 fL   MCH 30.5  26.0 - 34.0 pg   MCHC 33.2  30.0 - 36.0 g/dL   RDW 15.2  11.5 - 15.5 %   Platelets 240  150 - 400 K/uL  COMPREHENSIVE METABOLIC PANEL     Status: Abnormal   Collection Time    04/08/14  4:49 AM      Result Value Ref Range   Sodium 141  137 - 147 mEq/L   Potassium 4.1  3.7 - 5.3 mEq/L   Chloride 94 (*) 96 - 112 mEq/L   CO2 36 (*) 19 - 32 mEq/L   Glucose, Bld 189 (*) 70 - 99 mg/dL   BUN 18  6 - 23 mg/dL   Creatinine, Ser 1.18  0.50 - 1.35 mg/dL   Calcium 9.3  8.4 - 10.5 mg/dL   Total Protein 7.2  6.0 - 8.3 g/dL   Albumin 3.3 (*) 3.5 - 5.2 g/dL   AST 18  0 - 37 U/L   ALT 19  0 - 53 U/L   Alkaline Phosphatase 95  39 - 117 U/L   Total Bilirubin 0.2 (*) 0.3 - 1.2 mg/dL   GFR calc non Af Amer 64 (*) >90 mL/min   GFR calc Af Amer 74 (*) >90 mL/min   Comment: (NOTE)     The eGFR has been calculated using the CKD EPI equation.     This calculation has not been validated in all clinical situations.     eGFR's persistently <90 mL/min signify possible Chronic Kidney     Disease.   Anion gap 11  5 - 15  LIPID PANEL     Status: Abnormal   Collection Time    04/08/14  4:49 AM      Result Value Ref Range   Cholesterol 208 (*) 0 - 200 mg/dL   Triglycerides 48  <150 mg/dL   HDL 97  >39  mg/dL   Total CHOL/HDL Ratio 2.1     VLDL 10  0 - 40 mg/dL   LDL  Cholesterol 101 (*) 0 - 99 mg/dL   Comment:            Total Cholesterol/HDL:CHD Risk     Coronary Heart Disease Risk Table                         Men   Women      1/2 Average Risk   3.4   3.3      Average Risk       5.0   4.4      2 X Average Risk   9.6   7.1      3 X Average Risk  23.4   11.0                Use the calculated Patient Ratio     above and the CHD Risk Table     to determine the patient's CHD Risk.                ATP III CLASSIFICATION (LDL):      <100     mg/dL   Optimal      100-129  mg/dL   Near or Above                        Optimal      130-159  mg/dL   Borderline      160-189  mg/dL   High      >190     mg/dL   Very High    Dg Chest 2 View  04/07/2014   CLINICAL DATA:  Shortness of breath.  EXAM: CHEST  2 VIEW  COMPARISON:  03/14/2014  FINDINGS: Normal heart size. Hilar structures are prominent which may suggest arterial hypertension. Prominent emphysematous changes throughout the lungs with large bullous changes in the right upper lung and left upper and lower lungs. Fibrosis in the lungs. No focal airspace disease or consolidation. No pneumothorax. No blunting of costophrenic angles. Surgical clips in the base of the neck. No change since prior study.  IMPRESSION: Emphysematous changes and fibrosis in the lungs with prominent hilar structures possibly indicating pulmonary arterial hypertension. No evidence of active pulmonary disease. No change since prior study.   Electronically Signed   By: Lucienne Capers M.D.   On: 04/07/2014 21:49   Dg Knee 1-2 Views Left  04/08/2014   CLINICAL DATA:  Left knee pain and swelling.  No injury.  EXAM: LEFT KNEE - 1-2 VIEW  COMPARISON:  None.  FINDINGS: There is no evidence of fracture, dislocation, or joint effusion. There is no evidence of arthropathy or other focal bone abnormality. Soft tissues are unremarkable.  IMPRESSION: Negative.   Electronically Signed   By: Lucienne Capers M.D.   On: 04/08/2014 01:08    Review of  Systems  Respiratory:       End Stage COPD, was possible lung transplant patient.  Long term lung disease.  Cardiovascular:       CAD, hypertension  Musculoskeletal: Positive for joint pain (Knee pain last several days on the left.  History of fibromyalgia, chronic.).  Endo/Heme/Allergies:       History of anemia.   Blood pressure 160/80, pulse 103, temperature 98 F (36.7 C), temperature source Oral, resp. rate 23, height $RemoveBe'5\' 11"'ngwKbKryq$  (1.803 m), weight 85.4 kg (  188 lb 4.4 oz), SpO2 99.00%. Physical Exam  Constitutional: He appears well-developed and well-nourished.  HENT:  Head: Normocephalic and atraumatic.  Eyes: Conjunctivae and EOM are normal. Pupils are equal, round, and reactive to light.  Neck: Normal range of motion. Neck supple.  Cardiovascular: Normal rate, regular rhythm and intact distal pulses.   Respiratory: Effort normal. He has wheezes. He has rales.  GI: Soft.  Musculoskeletal: He exhibits tenderness (Pain left knee with small effusion.  ROM is 0 to 105.  Stable.  Had crepitus.  Not red or warm.  Other joints not painful.  ).  Neurological: He is alert. He has normal reflexes.  Skin: Skin is warm and dry.  Psychiatric: He has a normal mood and affect. His behavior is normal. Judgment and thought content normal.    Assessment/Plan: The left knee has effusion, slight, which was aspirated by sterile technique obtaining about 15 to 20 cc of blood tinged fluid.  I have sent the fluid off for culture and cell analysis.  I injected the knee with 1 cc DepoMedrol 40.  He tolerated it well.  X-rays of the knee were negative.  I doubt gout but possible.  I can see in office after discharge.  If gets worse or new problem, please re-consult.  Connor Armstrong 04/08/2014, 11:25 AM

## 2014-04-08 NOTE — Progress Notes (Signed)
.  Inpatient Diabetes Program Recommendations  AACE/ADA: New Consensus Statement on Inpatient Glycemic Control  Target Ranges:  Prepandial:   less than 140 mg/dL      Peak postprandial:   less than 180 mg/dL (1-2 hours)      Critically ill patients:  140 - 180 mg/dL  Pager:  [phone] Hours:  8 am-10pm   Reason for Visit: Elevated glucose: 189 mg/dL   Inpatient Diabetes Program Recommendations Correction (SSI): Consider adding Novolog Correction while on IV steriods.  Courtney Heys PhD, RN, BC-ADM Diabetes Coordinator  Office:  [phone] Team Pager:  [phone]

## 2014-04-08 NOTE — Progress Notes (Signed)
Subjective: This is a patient I know well with severe COPD. He had been sent to [facility] to see if he was a candidate for lung transplantation. He had cardiac catheterization and was told that he had a blockage in one of his arteries. He was turned down for lung transplantation but I'm not sure about what happened with his heart and I don't have the cardiac cath report or cardiology consultation. He came to the hospital yesterday because of increasing shortness of breath fever and swelling and pain of his left leg. He says he feels better. He has no new complaints.  Objective: Vital signs in last 24 hours: Temp:  [98 F (36.7 C)-99.2 F (37.3 C)] 98 F (36.7 C) (07/30 0431) Pulse Rate:  [114-124] 114 (07/29 2200) Resp:  [21-25] 21 (07/29 2200) BP: (135-161)/(81-107) 161/93 mmHg (07/29 2301) SpO2:  [97 %-100 %] 100 % (07/30 0724) FiO2 (%):  [30 %] 30 % (07/30 0323) Weight:  [83.462 kg (184 lb)-85.4 kg (188 lb 4.4 oz)] 85.4 kg (188 lb 4.4 oz) (07/29 2301) Weight change:     Intake/Output from previous day:    PHYSICAL EXAM General appearance: alert, cooperative and mild distress Resp: rhonchi bilaterally Cardio: regular rate and rhythm, S1, S2 normal, no murmur, click, rub or gallop GI: soft, non-tender; bowel sounds normal; no masses,  no organomegaly Extremities: He has some swelling of his left knee. His thigh is somewhat tender  Lab Results:  Results for orders placed during the hospital encounter of 04/07/14 (from the past 48 hour(s))  BASIC METABOLIC PANEL     Status: Abnormal   Collection Time    04/07/14  9:07 PM      Result Value Ref Range   Sodium 142  137 - 147 mEq/L   Potassium 4.3  3.7 - 5.3 mEq/L   Chloride 90 (*) 96 - 112 mEq/L   CO2 44 (*) 19 - 32 mEq/L   Comment: CRITICAL RESULT CALLED TO, READ BACK BY AND VERIFIED WITH:     HEADLEY,J ON 04/07/14 AT 2155 BY LOY,C   Glucose, Bld 112 (*) 70 - 99 mg/dL   BUN 19  6 - 23 mg/dL   Creatinine, Ser 1.40 (*) 0.50  - 1.35 mg/dL   Calcium 9.4  8.4 - 10.5 mg/dL   GFR calc non Af Amer 52 (*) >90 mL/min   GFR calc Af Amer 60 (*) >90 mL/min   Comment: (NOTE)     The eGFR has been calculated using the CKD EPI equation.     This calculation has not been validated in all clinical situations.     eGFR's persistently <90 mL/min signify possible Chronic Kidney     Disease.   Anion gap 8  5 - 15  CBC WITH DIFFERENTIAL     Status: None   Collection Time    04/07/14  9:07 PM      Result Value Ref Range   WBC 10.1  4.0 - 10.5 K/uL   RBC 4.52  4.22 - 5.81 MIL/uL   Hemoglobin 13.9  13.0 - 17.0 g/dL   HCT 42.2  39.0 - 52.0 %   MCV 93.4  78.0 - 100.0 fL   MCH 30.8  26.0 - 34.0 pg   MCHC 32.9  30.0 - 36.0 g/dL   RDW 15.3  11.5 - 15.5 %   Platelets 265  150 - 400 K/uL   Neutrophils Relative % 71  43 - 77 %   Neutro Abs  7.2  1.7 - 7.7 K/uL   Lymphocytes Relative 18  12 - 46 %   Lymphs Abs 1.8  0.7 - 4.0 K/uL   Monocytes Relative 8  3 - 12 %   Monocytes Absolute 0.8  0.1 - 1.0 K/uL   Eosinophils Relative 3  0 - 5 %   Eosinophils Absolute 0.3  0.0 - 0.7 K/uL   Basophils Relative 0  0 - 1 %   Basophils Absolute 0.0  0.0 - 0.1 K/uL  TROPONIN I     Status: None   Collection Time    04/07/14  9:07 PM      Result Value Ref Range   Troponin I <0.30  <0.30 ng/mL   Comment:            Due to the release kinetics of cTnI,     a negative result within the first hours     of the onset of symptoms does not rule out     myocardial infarction with certainty.     If myocardial infarction is still suspected,     repeat the test at appropriate intervals.  D-DIMER, QUANTITATIVE     Status: Abnormal   Collection Time    04/07/14  9:07 PM      Result Value Ref Range   D-Dimer, Quant 0.70 (*) 0.00 - 0.48 ug/mL-FEU   Comment:            AT THE INHOUSE ESTABLISHED CUTOFF     VALUE OF 0.48 ug/mL FEU,     THIS ASSAY HAS BEEN DOCUMENTED     IN THE LITERATURE TO HAVE     A SENSITIVITY AND NEGATIVE     PREDICTIVE VALUE OF  AT LEAST     98 TO 99%.  THE TEST RESULT     SHOULD BE CORRELATED WITH     AN ASSESSMENT OF THE CLINICAL     PROBABILITY OF DVT / VTE.  URIC ACID     Status: Abnormal   Collection Time    04/07/14 11:34 PM      Result Value Ref Range   Uric Acid, Serum 8.1 (*) 4.0 - 7.8 mg/dL  BLOOD GAS, ARTERIAL     Status: Abnormal   Collection Time    04/07/14 11:40 PM      Result Value Ref Range   O2 Content 3.5     Delivery systems NASAL CANNULA     pH, Arterial 7.375  7.350 - 7.450   pCO2 arterial 65.7 (*) 35.0 - 45.0 mmHg   Comment: CRITICAL RESULT CALLED TO, READ BACK BY AND VERIFIED WITH:     GRAY,M. RN AT 2357 04/07/14 ANDERSON,S.RRT/RCP   pO2, Arterial 91.9  80.0 - 100.0 mmHg   Bicarbonate 37.5 (*) 20.0 - 24.0 mEq/L   TCO2 33.2  0 - 100 mmol/L   Acid-Base Excess 11.9 (*) 0.0 - 2.0 mmol/L   O2 Saturation 97.1     Patient temperature 37.0     Collection site LEFT RADIAL     Drawn by [id number]     Sample type ARTERIAL     Allens test (pass/fail) PASS  PASS  CBC     Status: None   Collection Time    04/08/14  4:49 AM      Result Value Ref Range   WBC 9.8  4.0 - 10.5 K/uL   RBC 4.43  4.22 - 5.81 MIL/uL   Hemoglobin 13.5  13.0 - 17.0 g/dL  HCT 40.7  39.0 - 52.0 %   MCV 91.9  78.0 - 100.0 fL   MCH 30.5  26.0 - 34.0 pg   MCHC 33.2  30.0 - 36.0 g/dL   RDW 15.2  11.5 - 15.5 %   Platelets 240  150 - 400 K/uL  COMPREHENSIVE METABOLIC PANEL     Status: Abnormal   Collection Time    04/08/14  4:49 AM      Result Value Ref Range   Sodium 141  137 - 147 mEq/L   Potassium 4.1  3.7 - 5.3 mEq/L   Chloride 94 (*) 96 - 112 mEq/L   CO2 36 (*) 19 - 32 mEq/L   Glucose, Bld 189 (*) 70 - 99 mg/dL   BUN 18  6 - 23 mg/dL   Creatinine, Ser 1.18  0.50 - 1.35 mg/dL   Calcium 9.3  8.4 - 10.5 mg/dL   Total Protein 7.2  6.0 - 8.3 g/dL   Albumin 3.3 (*) 3.5 - 5.2 g/dL   AST 18  0 - 37 U/L   ALT 19  0 - 53 U/L   Alkaline Phosphatase 95  39 - 117 U/L   Total Bilirubin 0.2 (*) 0.3 - 1.2 mg/dL   GFR calc  non Af Amer 64 (*) >90 mL/min   GFR calc Af Amer 74 (*) >90 mL/min   Comment: (NOTE)     The eGFR has been calculated using the CKD EPI equation.     This calculation has not been validated in all clinical situations.     eGFR's persistently <90 mL/min signify possible Chronic Kidney     Disease.   Anion gap 11  5 - 15    ABGS  Recent Labs  04/07/14 2340  PHART 7.375  PO2ART 91.9  TCO2 33.2  HCO3 37.5*   CULTURES No results found for this or any previous visit (from the past 240 hour(s)). Studies/Results: Dg Chest 2 View  04/07/2014   CLINICAL DATA:  Shortness of breath.  EXAM: CHEST  2 VIEW  COMPARISON:  03/14/2014  FINDINGS: Normal heart size. Hilar structures are prominent which may suggest arterial hypertension. Prominent emphysematous changes throughout the lungs with large bullous changes in the right upper lung and left upper and lower lungs. Fibrosis in the lungs. No focal airspace disease or consolidation. No pneumothorax. No blunting of costophrenic angles. Surgical clips in the base of the neck. No change since prior study.  IMPRESSION: Emphysematous changes and fibrosis in the lungs with prominent hilar structures possibly indicating pulmonary arterial hypertension. No evidence of active pulmonary disease. No change since prior study.   Electronically Signed   By: Lucienne Capers M.D.   On: 04/07/2014 21:49   Dg Knee 1-2 Views Left  04/08/2014   CLINICAL DATA:  Left knee pain and swelling.  No injury.  EXAM: LEFT KNEE - 1-2 VIEW  COMPARISON:  None.  FINDINGS: There is no evidence of fracture, dislocation, or joint effusion. There is no evidence of arthropathy or other focal bone abnormality. Soft tissues are unremarkable.  IMPRESSION: Negative.   Electronically Signed   By: Lucienne Capers M.D.   On: 04/08/2014 01:08    Medications:  Prior to Admission:  Prescriptions prior to admission  Medication Sig Dispense Refill  . albuterol (PROVENTIL HFA;VENTOLIN HFA) 108 (90  BASE) MCG/ACT inhaler Inhale 2 puffs into the lungs every 6 (six) hours as needed. For shortness of breath       . albuterol (  PROVENTIL) (2.5 MG/3ML) 0.083% nebulizer solution Take 2.5 mg by nebulization 3 (three) times daily as needed for wheezing or shortness of breath.       . busPIRone (BUSPAR) 5 MG tablet Take 1 tablet (5 mg total) by mouth 3 (three) times daily.  90 tablet  5  . cyclobenzaprine (FLEXERIL) 10 MG tablet Take 10 mg by mouth 2 (two) times daily.       . Fluticasone Furoate-Vilanterol (BREO ELLIPTA) 100-25 MCG/INH AEPB Inhale 1 puff into the lungs daily.       . furosemide (LASIX) 40 MG tablet Take 40 mg by mouth daily as needed for fluid.       Marland Kitchen gabapentin (NEURONTIN) 300 MG capsule Take 300 mg by mouth 2 (two) times daily.       . Milnacipran (SAVELLA) 50 MG TABS Take 50 mg by mouth 2 (two) times daily.       . predniSONE (DELTASONE) 10 MG tablet Take 1 tablet (10 mg total) by mouth daily.  100 tablet  12  . pseudoephedrine-guaifenesin (MUCINEX D) 60-600 MG per tablet Take 1 tablet by mouth every 12 (twelve) hours.      . roflumilast (DALIRESP) 500 MCG TABS tablet Take 1 tablet (500 mcg total) by mouth daily.  30 tablet  12  . tiotropium (SPIRIVA) 18 MCG inhalation capsule Place 18 mcg into inhaler and inhale daily.         Scheduled: . antiseptic oral rinse  7 mL Mouth Rinse BID  . aspirin  81 mg Oral Once  . busPIRone  5 mg Oral TID  . chlorhexidine  15 mL Mouth Rinse BID  . cyclobenzaprine  10 mg Oral BID  . dextromethorphan-guaiFENesin  1 tablet Oral BID  . gabapentin  300 mg Oral BID  . ipratropium  0.5 mg Nebulization Q6H  . levalbuterol  0.63 mg Nebulization Q6H  . levofloxacin (LEVAQUIN) IV  500 mg Intravenous Q24H  . methylPREDNISolone (SOLU-MEDROL) injection  60 mg Intravenous Q6H  . Milnacipran  50 mg Oral BID  . mometasone-formoterol  2 puff Inhalation BID  . roflumilast  500 mcg Oral Daily  . sodium chloride  3 mL Intravenous Q12H  . sodium chloride  3  mL Intravenous Q12H   Continuous:  TIW:PYKDXI chloride, furosemide, hydrALAZINE, HYDROcodone-acetaminophen, sodium chloride  Assesment: He was admitted with COPD exacerbation. He has acute pain in his left knee. He has some swelling. His uric acid level was up so this may be from gout. He has severe end-stage COPD and we are seeing if he's a candidate for lung transplantation. He was turned down at [facility] and hopes to be evaluated at the [facility] in [place]. Part of the problem is that he has cardiac disease but I'm not sure of the extent of that and whether there's anything that can be done to improve it. He is asymptomatic from a cardiac point of view at least as far as chest pain is concerned. He also was found to have esophageal dysmotility. Principal Problem:   COPD exacerbation Active Problems:   Hypertension   Fibromyalgia   Anxiety state, unspecified   Acute pain of left knee   Coronary atherosclerosis of native coronary artery    Plan: Continue treatments for COPD. I will request cardiology consultation to see if there something we could do about his cardiac disease. I will request orthopedic consultation because he may need aspiration of his knee joint to firmly establish a diagnosis he's going to  have a ultrasound of his leg to see if he has DVT.    LOS: 1 day   Katlynne Mckercher L 04/08/2014, 8:43 AM

## 2014-04-08 NOTE — Progress Notes (Signed)
UR chart review completed.  

## 2014-04-08 NOTE — Consult Note (Signed)
Consulting cardiologist: Dr. Satira Sark Requesting physician: Dr. Sinda Du  Clinical Summary Connor Armstrong is a 63 y.o.male with past medical history outlined below, currently admitted to the hospital with COPD exacerbation undergoing management by Dr. Luan Pulling. We are consulted to look into his cardiac history and determine if any additional studies are necessary. At the present time there are no records available to review. My understanding is that Connor Armstrong has undergone a lung transplant evaluation at [facility] that began back in May. He states that he underwent cardiac testing to include an echocardiogram, a cardiac catheterization done in June, and more recently a followup myocardial perfusion study. He recalls being told that his heart catheterization looked "okay" with a "10% blockage", however the results of his stress test were stated to him to be more consistent with an occluded right coronary artery. My suspicion at least based on his description of procedures and the sequence of events is that he may have a chronic total occlusion of the right coronary artery with subsequent abnormal Myoview in this distribution. It does not sound like there were any plans to revascularize this area. I could not get any records through [facility].  He does not report any angina symptoms, has chronic shortness of breath at least NYHA class III. He is not aware of any prior history of myocardial infarction.  Echocardiogram from March 2014 showed LVEF 85-46%, grade 1 diastolic dysfunction, mildly thickened mitral leaflets with trivial mitral regurgitation, mild left atrial enlargement, mild tricuspid regurgitation, unable to assess PASP.   Allergies  Allergen Reactions  . Morphine And Related Other (See Comments)    Paralysis, "shuts down kidney and bowel tract"   . Other     All narcotics cause paralysis, and shuts down kidney and bowel tract     Medications Scheduled  Medications: . antiseptic oral rinse  7 mL Mouth Rinse BID  . busPIRone  5 mg Oral TID  . chlorhexidine  15 mL Mouth Rinse BID  . cyclobenzaprine  10 mg Oral BID  . dextromethorphan-guaiFENesin  1 tablet Oral BID  . gabapentin  300 mg Oral BID  . ipratropium  0.5 mg Nebulization Q6H  . levalbuterol  0.63 mg Nebulization Q6H  . levofloxacin (LEVAQUIN) IV  500 mg Intravenous Q24H  . methylPREDNISolone (SOLU-MEDROL) injection  60 mg Intravenous Q6H  . Milnacipran  50 mg Oral BID  . mometasone-formoterol  2 puff Inhalation BID  . roflumilast  500 mcg Oral Daily  . sodium chloride  3 mL Intravenous Q12H  . sodium chloride  3 mL Intravenous Q12H     PRN Medications: sodium chloride, furosemide, hydrALAZINE, HYDROcodone-acetaminophen, sodium chloride   Past Medical History  Diagnosis Date  . End stage COPD     3L/min and on BPAP @ [facility]  . Fibromyalgia   . Shingles   . Renal cancer   . Helicobacter pylori gastritis March 2014  . Candida esophagitis March 2014  . Essential hypertension, benign   . Coronary atherosclerosis of native coronary artery     Details not clear - evaluated at [facility]    Past Surgical History  Procedure Laterality Date  . Hernia repair    . Thyroid surgery    . Parathyroid surgery      Benign tumor  . Partial nephrectomy  2005    Right  . Flexible sigmoidoscopy  09/27/2012    EVO:[id number]  . Esophagogastroduodenoscopy (egd) with esophageal dilation N/A 11/21/2012    SLF: DYSPHAGIA  MOST LIKELY DUE TO CANDIDA ESOPHAGITIS/Small hiatal hernia/ MODERATE Non-erosive gastritis with +H.PYLORI ON PATH  . Colonoscopy N/A 01/20/2013    SLF:9 COLON POLYPS REMOVED/MILD diverticulosis in the descending colon and sigmoid colon/MODERATE INTERNAL HEMORRHOIDS  . Esophagogastroduodenoscopy (egd) with propofol N/A 01/27/2013    IYM:EBRA WHITE PLAQUES IN THE ESOPHAGUS.  BRUSH BIOPSIES OBTAINED/Non-erosive gastritis (inflammation) was found in the gastric antrum;  multiple bx/  . Savory dilation N/A 01/27/2013    Procedure: SAVORY DILATION;  Surgeon: Danie Binder, MD;  Location: [facility];  Service: Endoscopy;  Laterality: N/A;  . Esophageal biopsy N/A 01/27/2013    Procedure: BIOPSY;  Surgeon: Danie Binder, MD;  Location: [facility];  Service: Endoscopy;  Laterality: N/A;  Gastric Biopsies    Family History  Problem Relation Age of Onset  . Hypertension Mother   . Colon cancer Paternal Grandmother     Social History Connor Armstrong reports that he quit smoking about 18 months ago. His smoking use included Cigarettes. He has a 63 pack-year smoking history. He has quit using smokeless tobacco. Connor Armstrong reports that he does not drink alcohol.  Review of Systems Intermittent cough and wheezing, chest congestion. No regular angina symptoms. No palpitations or syncope. No fevers or chills. Stable appetite. Other systems reviewed and negative except as outlined.  Physical Examination Blood pressure 161/93, pulse 114, temperature 98 F (36.7 C), temperature source Oral, resp. rate 21, height 5\' 11"  (1.803 m), weight 188 lb 4.4 oz (85.4 kg), SpO2 100.00%. No intake or output data in the 24 hours ending 04/08/14 3094  Telemetry: Sinus rhythm and sinus tachycardia.  HEENT: Conjunctiva and lids normal, oropharynx clear. Neck: Supple, no elevated JVP or carotid bruits, no thyromegaly. Lungs: Decreased breath sounds throughout without active wheezing, nonlabored breathing at rest. Cardiac: Regular rate and rhythm, no S3, soft systolic murmur, no pericardial rub. Abdomen: Soft, nontender, bowel sounds present, no guarding or rebound. Extremities: Trace ankle edema, distal pulses 2+. Skin: Warm and dry. Musculoskeletal: No kyphosis. Neuropsychiatric: Alert and oriented x3, affect grossly appropriate.   Lab Results  Basic Metabolic Panel:  Recent Labs Lab 04/07/14 2107 04/08/14 0449  NA 142 141  K 4.3 4.1  CL 90* 94*  CO2 44* 36*  GLUCOSE 112*  189*  BUN 19 18  CREATININE 1.40* 1.18  CALCIUM 9.4 9.3    Liver Function Tests:  Recent Labs Lab 04/08/14 0449  AST 18  ALT 19  ALKPHOS 95  BILITOT 0.2*  PROT 7.2  ALBUMIN 3.3*    CBC:  Recent Labs Lab 04/07/14 2107 04/08/14 0449  WBC 10.1 9.8  NEUTROABS 7.2  --   HGB 13.9 13.5  HCT 42.2 40.7  MCV 93.4 91.9  PLT 265 240    Cardiac Enzymes:  Recent Labs Lab 04/07/14 2107  TROPONINI <0.30    ECG Recent ECG from July 6 showed sinus tachycardia with PACs, right atrial enlargement, and nonspecific ST-T changes.  Imaging CHEST 2 VIEW  COMPARISON: 03/14/2014  FINDINGS:  Normal heart size. Hilar structures are prominent which may suggest  arterial hypertension. Prominent emphysematous changes throughout  the lungs with large bullous changes in the right upper lung and  left upper and lower lungs. Fibrosis in the lungs. No focal airspace  disease or consolidation. No pneumothorax. No blunting of  costophrenic angles. Surgical clips in the base of the neck. No  change since prior study.  IMPRESSION:  Emphysematous changes and fibrosis in the lungs with prominent hilar  structures possibly  indicating pulmonary arterial hypertension. No  evidence of active pulmonary disease. No change since prior study.   Impression  1. Coronary artery disease based on limited information, clinically stable at this time. Based on the patient's description of procedures at [facility] and the sequence of events, my suspicion is that he has a chronic total occlusion of the RCA perhaps with collaterals, and has an abnormal myocardial perfusion study in this distribution. Not entirely clear about LVEF, although this was normal as of echocardiogram last year at this facility. Patient apparently declined for lung transplant evaluation at [facility] based not only on this issue but other parameters, although I do not have the details.  2. End-stage COPD on oxygen. Current COPD exacerbation. Managed  by Dr. Luan Pulling.  3. History of hypertension, currently on no medical therapy.  4. Uncertain lipid status.   Recommendations  Discussed the situation with the patient based on limited information available. [place] request records from [facility], specifically regarding recent echocardiogram, cardiac catheterization results, and myocardial perfusion stress test results. Does not sound like any acute cardiac testing is needed at this time, first a review of his recent evaluation and then determine the next step. Start aspirin daily and check lipid panel as statin therapy may be indicated. Blood pressure intermittently elevated, may be a good candidate for low-dose ACE inhibitor as well.   Satira Sark, M.D., F.A.C.C.

## 2014-04-08 NOTE — Care Management Note (Addendum)
    Page 1 of 1   04/09/2014     3:52:23 PM CARE MANAGEMENT NOTE 04/09/2014  Patient:  Connor Armstrong, Connor Armstrong   Account Number:  [id number]  Date Initiated:  04/08/2014  Documentation initiated by:  CHILDRESS,JESSICA  Subjective/Objective Assessment:   Patient admitted for COPD exacerbation. Patient from home, lives with wife. Patient has cane, walker and wheelchair for PRN use at home. Patient is active with [facility].     Action/Plan:   Patient plans to D/C home with resumption of [facility] services. Will continue to follow for CM needs.   Anticipated DC Date:     Anticipated DC Plan:  [place]  CM consult      Choice offered to / List presented to:             Status of service:  Completed, signed off Medicare Important Message given?  YES (If response is "NO", the following Medicare IM given date fields will be blank) Date Medicare IM given:  04/09/2014 Medicare IM given by:  Jolene Provost Date Additional Medicare IM given:   Additional Medicare IM given by:    Discharge Disposition:  [place]  Per UR Regulation:    If discussed at Long Length of Stay Meetings, dates discussed:    Comments:  04/09/2014 [place], RN, MSN, PCCN upon clarification patient is not active with [facility] but has seen them previously for [facility] services. patient does get his home oxygen from [facility]. No new HH needs anticipated at D/C. Pt plans to D/c hom with self care over the weekend. No CM needs noted.  04/08/2014 [place], RN, MSN, [facility]

## 2014-04-09 DIAGNOSIS — R000 Tachycardia, unspecified: Secondary | ICD-10-CM

## 2014-04-09 LAB — BODY FLUID CELL COUNT WITH DIFFERENTIAL
Lymphs, Fluid: 2 %
Monocyte-Macrophage-Serous Fluid: 53 % (ref 50–90)
NEUTROPHIL FLUID: 45 % — AB (ref 0–25)
Other Cells, Fluid: NONE SEEN %
WBC FLUID: 3831 uL — AB (ref 0–1000)

## 2014-04-09 MED ORDER — LEVOFLOXACIN IN D5W 500 MG/100ML IV SOLN
INTRAVENOUS | Status: AC
  Filled 2014-04-09: qty 100

## 2014-04-09 MED ORDER — DILTIAZEM HCL 30 MG PO TABS
30.0000 mg | ORAL_TABLET | Freq: Four times a day (QID) | ORAL | Status: DC
  Administered 2014-04-09 – 2014-04-10 (×6): 30 mg via ORAL
  Filled 2014-04-09 (×6): qty 1

## 2014-04-09 MED ORDER — ENOXAPARIN SODIUM 40 MG/0.4ML ~~LOC~~ SOLN
40.0000 mg | SUBCUTANEOUS | Status: DC
  Administered 2014-04-09: 40 mg via SUBCUTANEOUS
  Filled 2014-04-09: qty 0.4

## 2014-04-09 MED ORDER — PANTOPRAZOLE SODIUM 40 MG PO TBEC
40.0000 mg | DELAYED_RELEASE_TABLET | Freq: Every day | ORAL | Status: DC
  Administered 2014-04-09 – 2014-04-10 (×2): 40 mg via ORAL
  Filled 2014-04-09 (×2): qty 1

## 2014-04-09 MED ORDER — ALUM & MAG HYDROXIDE-SIMETH 200-200-20 MG/5ML PO SUSP
30.0000 mL | ORAL | Status: DC | PRN
  Administered 2014-04-09 – 2014-04-10 (×2): 30 mL via ORAL
  Filled 2014-04-09 (×2): qty 30

## 2014-04-09 NOTE — Progress Notes (Signed)
Pt transferred to 312 per Dr. Luan Pulling. Pt's IV site flushed, patent and WDL. VSS at this time. Report called to Elliot Dally, RN. Questions answered accordingly. Pt left floor via WC in stable condition accompanied by NT, Francetta Found.

## 2014-04-09 NOTE — Progress Notes (Signed)
Subjective: He says he feels okay. His breathing is better. His leg is much better.  Objective: Vital signs in last 24 hours: Temp:  [97.5 F (36.4 C)-98.2 F (36.8 C)] 98.1 F (36.7 C) (07/31 0800) Pulse Rate:  [34-131] 100 (07/31 0600) Resp:  [13-23] 20 (07/31 0316) BP: (115-169)/(56-121) 150/90 mmHg (07/31 0600) SpO2:  [92 %-99 %] 98 % (07/31 0730) FiO2 (%):  [30 %] 30 % (07/31 0316) Weight:  [85.7 kg (188 lb 15 oz)] 85.7 kg (188 lb 15 oz) (07/31 0511) Weight change: 2.238 kg (4 lb 15 oz) Last BM Date: 04/07/14  Intake/Output from previous day: 07/30 0701 - 07/31 0700 In: 120 [P.O.:120] Out: 1600 [Urine:1600]  PHYSICAL EXAM General appearance: alert, cooperative and mild distress Resp: clear to auscultation bilaterally Cardio: regular rate and rhythm, S1, S2 normal, no murmur, click, rub or gallop GI: soft, non-tender; bowel sounds normal; no masses,  no organomegaly Extremities: extremities normal, atraumatic, no cyanosis or edema  Lab Results:  Results for orders placed during the hospital encounter of 04/07/14 (from the past 48 hour(s))  BASIC METABOLIC PANEL     Status: Abnormal   Collection Time    04/07/14  9:07 PM      Result Value Ref Range   Sodium 142  137 - 147 mEq/L   Potassium 4.3  3.7 - 5.3 mEq/L   Chloride 90 (*) 96 - 112 mEq/L   CO2 44 (*) 19 - 32 mEq/L   Comment: CRITICAL RESULT CALLED TO, READ BACK BY AND VERIFIED WITH:     HEADLEY,J ON 04/07/14 AT 2155 BY LOY,C   Glucose, Bld 112 (*) 70 - 99 mg/dL   BUN 19  6 - 23 mg/dL   Creatinine, Ser 1.40 (*) 0.50 - 1.35 mg/dL   Calcium 9.4  8.4 - 10.5 mg/dL   GFR calc non Af Amer 52 (*) >90 mL/min   GFR calc Af Amer 60 (*) >90 mL/min   Comment: (NOTE)     The eGFR has been calculated using the CKD EPI equation.     This calculation has not been validated in all clinical situations.     eGFR's persistently <90 mL/min signify possible Chronic Kidney     Disease.   Anion gap 8  5 - 15  CBC WITH  DIFFERENTIAL     Status: None   Collection Time    04/07/14  9:07 PM      Result Value Ref Range   WBC 10.1  4.0 - 10.5 K/uL   RBC 4.52  4.22 - 5.81 MIL/uL   Hemoglobin 13.9  13.0 - 17.0 g/dL   HCT 42.2  39.0 - 52.0 %   MCV 93.4  78.0 - 100.0 fL   MCH 30.8  26.0 - 34.0 pg   MCHC 32.9  30.0 - 36.0 g/dL   RDW 15.3  11.5 - 15.5 %   Platelets 265  150 - 400 K/uL   Neutrophils Relative % 71  43 - 77 %   Neutro Abs 7.2  1.7 - 7.7 K/uL   Lymphocytes Relative 18  12 - 46 %   Lymphs Abs 1.8  0.7 - 4.0 K/uL   Monocytes Relative 8  3 - 12 %   Monocytes Absolute 0.8  0.1 - 1.0 K/uL   Eosinophils Relative 3  0 - 5 %   Eosinophils Absolute 0.3  0.0 - 0.7 K/uL   Basophils Relative 0  0 - 1 %   Basophils Absolute  0.0  0.0 - 0.1 K/uL  TROPONIN I     Status: None   Collection Time    04/07/14  9:07 PM      Result Value Ref Range   Troponin I <0.30  <0.30 ng/mL   Comment:            Due to the release kinetics of cTnI,     a negative result within the first hours     of the onset of symptoms does not rule out     myocardial infarction with certainty.     If myocardial infarction is still suspected,     repeat the test at appropriate intervals.  D-DIMER, QUANTITATIVE     Status: Abnormal   Collection Time    04/07/14  9:07 PM      Result Value Ref Range   D-Dimer, Quant 0.70 (*) 0.00 - 0.48 ug/mL-FEU   Comment:            AT THE INHOUSE ESTABLISHED CUTOFF     VALUE OF 0.48 ug/mL FEU,     THIS ASSAY HAS BEEN DOCUMENTED     IN THE LITERATURE TO HAVE     A SENSITIVITY AND NEGATIVE     PREDICTIVE VALUE OF AT LEAST     98 TO 99%.  THE TEST RESULT     SHOULD BE CORRELATED WITH     AN ASSESSMENT OF THE CLINICAL     PROBABILITY OF DVT / VTE.  URIC ACID     Status: Abnormal   Collection Time    04/07/14 11:34 PM      Result Value Ref Range   Uric Acid, Serum 8.1 (*) 4.0 - 7.8 mg/dL  BLOOD GAS, ARTERIAL     Status: Abnormal   Collection Time    04/07/14 11:40 PM      Result Value Ref  Range   O2 Content 3.5     Delivery systems NASAL CANNULA     pH, Arterial 7.375  7.350 - 7.450   pCO2 arterial 65.7 (*) 35.0 - 45.0 mmHg   Comment: CRITICAL RESULT CALLED TO, READ BACK BY AND VERIFIED WITH:     GRAY,M. RN AT 2357 04/07/14 ANDERSON,S.RRT/RCP   pO2, Arterial 91.9  80.0 - 100.0 mmHg   Bicarbonate 37.5 (*) 20.0 - 24.0 mEq/L   TCO2 33.2  0 - 100 mmol/L   Acid-Base Excess 11.9 (*) 0.0 - 2.0 mmol/L   O2 Saturation 97.1     Patient temperature 37.0     Collection site LEFT RADIAL     Drawn by [id number]     Sample type ARTERIAL     Allens test (pass/fail) PASS  PASS  CBC     Status: None   Collection Time    04/08/14  4:49 AM      Result Value Ref Range   WBC 9.8  4.0 - 10.5 K/uL   RBC 4.43  4.22 - 5.81 MIL/uL   Hemoglobin 13.5  13.0 - 17.0 g/dL   HCT 40.7  39.0 - 52.0 %   MCV 91.9  78.0 - 100.0 fL   MCH 30.5  26.0 - 34.0 pg   MCHC 33.2  30.0 - 36.0 g/dL   RDW 15.2  11.5 - 15.5 %   Platelets 240  150 - 400 K/uL  COMPREHENSIVE METABOLIC PANEL     Status: Abnormal   Collection Time    04/08/14  4:49 AM      Result Value  Ref Range   Sodium 141  137 - 147 mEq/L   Potassium 4.1  3.7 - 5.3 mEq/L   Chloride 94 (*) 96 - 112 mEq/L   CO2 36 (*) 19 - 32 mEq/L   Glucose, Bld 189 (*) 70 - 99 mg/dL   BUN 18  6 - 23 mg/dL   Creatinine, Ser 1.18  0.50 - 1.35 mg/dL   Calcium 9.3  8.4 - 10.5 mg/dL   Total Protein 7.2  6.0 - 8.3 g/dL   Albumin 3.3 (*) 3.5 - 5.2 g/dL   AST 18  0 - 37 U/L   ALT 19  0 - 53 U/L   Alkaline Phosphatase 95  39 - 117 U/L   Total Bilirubin 0.2 (*) 0.3 - 1.2 mg/dL   GFR calc non Af Amer 64 (*) >90 mL/min   GFR calc Af Amer 74 (*) >90 mL/min   Comment: (NOTE)     The eGFR has been calculated using the CKD EPI equation.     This calculation has not been validated in all clinical situations.     eGFR's persistently <90 mL/min signify possible Chronic Kidney     Disease.   Anion gap 11  5 - 15  LIPID PANEL     Status: Abnormal   Collection Time     04/08/14  4:49 AM      Result Value Ref Range   Cholesterol 208 (*) 0 - 200 mg/dL   Triglycerides 48  <150 mg/dL   HDL 97  >39 mg/dL   Total CHOL/HDL Ratio 2.1     VLDL 10  0 - 40 mg/dL   LDL Cholesterol 101 (*) 0 - 99 mg/dL   Comment:            Total Cholesterol/HDL:CHD Risk     Coronary Heart Disease Risk Table                         Men   Women      1/2 Average Risk   3.4   3.3      Average Risk       5.0   4.4      2 X Average Risk   9.6   7.1      3 X Average Risk  23.4   11.0                Use the calculated Patient Ratio     above and the CHD Risk Table     to determine the patient's CHD Risk.                ATP III CLASSIFICATION (LDL):      <100     mg/dL   Optimal      100-129  mg/dL   Near or Above                        Optimal      130-159  mg/dL   Borderline      160-189  mg/dL   High      >190     mg/dL   Very High  BODY FLUID CELL COUNT WITH DIFFERENTIAL     Status: Abnormal   Collection Time    04/08/14 11:25 AM      Result Value Ref Range   Fluid Type-TYPFL SYNOVIAL     Fluid Type-FCT KNEE  Comment: LEFT     CORRECTED ON 07/30 AT 1141: PREVIOUSLY REPORTED AS JOINT, KNEE   Color, Fluid PINK (*) YELLOW   Appearance, Fluid HAZY (*) CLEAR   WBC, Fluid 3831 (*) 0 - 1000 cu mm   Neutrophil Count, Fluid 45 (*) 0 - 25 %   Lymphs, Fluid 2     Monocyte-Macrophage-Serous Fluid 53  50 - 90 %   Other Cells, Fluid NO CRYSTALS SEEN    BODY FLUID CULTURE     Status: None   Collection Time    04/08/14 11:26 AM      Result Value Ref Range   Specimen Description KNEE LEFT     Special Requests NONE     Gram Stain       Value: FEW WBC PRESENT, PREDOMINANTLY PMN     NO ORGANISMS SEEN     Performed at [facility]   Culture PENDING     Report Status PENDING      ABGS  Recent Labs  04/07/14 2340  PHART 7.375  PO2ART 91.9  TCO2 33.2  HCO3 37.5*   CULTURES Recent Results (from the past 240 hour(s))  BODY FLUID CULTURE     Status: None    Collection Time    04/08/14 11:26 AM      Result Value Ref Range Status   Specimen Description KNEE LEFT   Final   Special Requests NONE   Final   Gram Stain     Final   Value: FEW WBC PRESENT, PREDOMINANTLY PMN     NO ORGANISMS SEEN     Performed at [facility]   Culture PENDING   Incomplete   Report Status PENDING   Incomplete   Studies/Results: Dg Chest 2 View  04/07/2014   CLINICAL DATA:  Shortness of breath.  EXAM: CHEST  2 VIEW  COMPARISON:  03/14/2014  FINDINGS: Normal heart size. Hilar structures are prominent which may suggest arterial hypertension. Prominent emphysematous changes throughout the lungs with large bullous changes in the right upper lung and left upper and lower lungs. Fibrosis in the lungs. No focal airspace disease or consolidation. No pneumothorax. No blunting of costophrenic angles. Surgical clips in the base of the neck. No change since prior study.  IMPRESSION: Emphysematous changes and fibrosis in the lungs with prominent hilar structures possibly indicating pulmonary arterial hypertension. No evidence of active pulmonary disease. No change since prior study.   Electronically Signed   By: Lucienne Capers M.D.   On: 04/07/2014 21:49   Dg Knee 1-2 Views Left  04/08/2014   CLINICAL DATA:  Left knee pain and swelling.  No injury.  EXAM: LEFT KNEE - 1-2 VIEW  COMPARISON:  None.  FINDINGS: There is no evidence of fracture, dislocation, or joint effusion. There is no evidence of arthropathy or other focal bone abnormality. Soft tissues are unremarkable.  IMPRESSION: Negative.   Electronically Signed   By: Lucienne Capers M.D.   On: 04/08/2014 01:08   US Venous Img Lower Bilateral  04/08/2014   CLINICAL DATA:  ? DVT  EXAM: BILATERAL LOWER EXTREMITY VENOUS DOPPLER ULTRASOUND  TECHNIQUE: Gray-scale sonography with compression, as well as color and duplex ultrasound, were performed to evaluate the deep venous system from the level of the common femoral vein through  the popliteal and proximal calf veins.  COMPARISON:  None  FINDINGS: Normal compressibility of the common femoral, superficial femoral, and popliteal veins, as well as the proximal calf veins. No filling defects  to suggest DVT on grayscale or color Doppler imaging. Doppler waveforms show normal direction of venous flow, normal respiratory phasicity and response to augmentation. Visualized segments of the saphenous venous system normal in caliber and compressibility.  IMPRESSION: 1.  No evidence of lower extremity deep vein thrombosis,bilateral.   Electronically Signed   By: Arne Cleveland M.D.   On: 04/08/2014 13:00    Medications:  Prior to Admission:  Prescriptions prior to admission  Medication Sig Dispense Refill  . albuterol (PROVENTIL HFA;VENTOLIN HFA) 108 (90 BASE) MCG/ACT inhaler Inhale 2 puffs into the lungs every 6 (six) hours as needed. For shortness of breath       . albuterol (PROVENTIL) (2.5 MG/3ML) 0.083% nebulizer solution Take 2.5 mg by nebulization 3 (three) times daily as needed for wheezing or shortness of breath.       . busPIRone (BUSPAR) 5 MG tablet Take 1 tablet (5 mg total) by mouth 3 (three) times daily.  90 tablet  5  . cyclobenzaprine (FLEXERIL) 10 MG tablet Take 10 mg by mouth 2 (two) times daily.       . Fluticasone Furoate-Vilanterol (BREO ELLIPTA) 100-25 MCG/INH AEPB Inhale 1 puff into the lungs daily.       . furosemide (LASIX) 40 MG tablet Take 40 mg by mouth daily as needed for fluid.       Marland Kitchen gabapentin (NEURONTIN) 300 MG capsule Take 300 mg by mouth 2 (two) times daily.       . Milnacipran (SAVELLA) 50 MG TABS Take 50 mg by mouth 2 (two) times daily.       . predniSONE (DELTASONE) 10 MG tablet Take 1 tablet (10 mg total) by mouth daily.  100 tablet  12  . pseudoephedrine-guaifenesin (MUCINEX D) 60-600 MG per tablet Take 1 tablet by mouth every 12 (twelve) hours.      . roflumilast (DALIRESP) 500 MCG TABS tablet Take 1 tablet (500 mcg total) by mouth daily.  30  tablet  12  . tiotropium (SPIRIVA) 18 MCG inhalation capsule Place 18 mcg into inhaler and inhale daily.         Scheduled: . antiseptic oral rinse  7 mL Mouth Rinse BID  . busPIRone  5 mg Oral TID  . chlorhexidine  15 mL Mouth Rinse BID  . cyclobenzaprine  10 mg Oral BID  . dextromethorphan-guaiFENesin  1 tablet Oral BID  . diltiazem  30 mg Oral 4 times per day  . gabapentin  300 mg Oral BID  . ipratropium  0.5 mg Nebulization Q6H  . levalbuterol  0.63 mg Nebulization Q6H  . levofloxacin (LEVAQUIN) IV  500 mg Intravenous Q24H  . methylPREDNISolone (SOLU-MEDROL) injection  60 mg Intravenous Q6H  . Milnacipran  50 mg Oral BID  . mometasone-formoterol  2 puff Inhalation BID  . roflumilast  500 mcg Oral Daily  . sodium chloride  3 mL Intravenous Q12H  . sodium chloride  3 mL Intravenous Q12H   Continuous:  WER:XVQMGQ chloride, furosemide, hydrALAZINE, HYDROcodone-acetaminophen, sodium chloride  Assesment: He was admitted with COPD exacerbation. He has known coronary disease now which is a new finding. He has fibromyalgia which is unchanged. Principal Problem:   COPD exacerbation Active Problems:   Hypertension   Fibromyalgia   Anxiety state, unspecified   Acute pain of left knee   Coronary atherosclerosis of native coronary artery    Plan: Transfer from the intensive care unit now. Probably discharge tomorrow    LOS: 2 days   Vernadine Coombs L  04/09/2014, 8:54 AM

## 2014-04-09 NOTE — Clinical Documentation Improvement (Signed)
04/07/14 H&P..."has severe COPD and is on home oxygen. Patient is also on waiting list for lung transplant..." TX: 04/07/14 H&P.Marland Kitchen"Solu-Medrol 60 mg IV every 6 hours, Xopenex and Atrovent nebulizers scheduled every 6 hours. Levaquin 500 mg IV daily, Mucinex DM one tablet by mouth twice a day." Also receiving Nasal O2 @ 3l/ min and Bipap at night . ABG's noted per MD prog notes.   For accurate Dx specificity & severity can noted above cond and tx be further specified with condition being eval'd, mon'd & tx'd. Thank you  Possible Clinical Conditions? Acute Respiratory Failure Acute on Chronic Respiratory Failure Chronic Respiratory Failure Other Condition Cannot Clinically Determine   Supporting Information: Risk Factors: See above note Signs & Symptoms: See above note Diagnostics: See above note Treatment: See above note  Thank You, Ermelinda Das, RN, BSN, CCDS Certified Clinical Documentation Specialist Pager: [facility]

## 2014-04-09 NOTE — Progress Notes (Signed)
Pt ambulated around unit once with standby assist from NS. Tolerated well. Slightly short of breath, however this is chronic for patient. Will continue to monitor patient.

## 2014-04-09 NOTE — Progress Notes (Signed)
Consulting cardiologist: Carlyle Dolly MD Primary Cardiologist: Domenic Polite, MD  Subjective:    Complaints of constant chest tightness, pain between shoulder blades which is chronic. Records from [facility] concerning prior stress testing.   Objective:   Temp:  [97.5 F (36.4 C)-98.2 F (36.8 C)] 98.1 F (36.7 C) (07/31 0800) Pulse Rate:  [34-131] 100 (07/31 0600) Resp:  [13-23] 20 (07/31 0316) BP: (115-169)/(56-121) 150/90 mmHg (07/31 0600) SpO2:  [92 %-99 %] 98 % (07/31 0730) FiO2 (%):  [30 %] 30 % (07/31 0316) Weight:  [188 lb 15 oz (85.7 kg)] 188 lb 15 oz (85.7 kg) (07/31 0511) Last BM Date: 04/07/14  Filed Weights   04/07/14 2022 04/07/14 2301 04/09/14 0511  Weight: 184 lb (83.462 kg) 188 lb 4.4 oz (85.4 kg) 188 lb 15 oz (85.7 kg)    Intake/Output Summary (Last 24 hours) at 04/09/14 0815 Last data filed at 04/08/14 2206  Gross per 24 hour  Intake    120 ml  Output   1600 ml  Net  -1480 ml    Telemetry:  Exam:  General: No acute distress. Slightly dyspneac.  HEENT: Conjunctiva and lids normal, oropharynx clear.  Lungs: Inspiratory and expiratory wheezes.  Cardiac: No elevated JVP or bruits. RRR, tachycardic, no gallop or rub.   Abdomen: Normoactive bowel sounds, nontender, nondistended.  Extremities: No pitting edema, distal pulses full.  Neuropsychiatric: Alert and oriented x3, affect appropriate.   Lab Results:  Basic Metabolic Panel:  Recent Labs Lab 04/07/14 2107 04/08/14 0449  NA 142 141  K 4.3 4.1  CL 90* 94*  CO2 44* 36*  GLUCOSE 112* 189*  BUN 19 18  CREATININE 1.40* 1.18  CALCIUM 9.4 9.3    Liver Function Tests:  Recent Labs Lab 04/08/14 0449  AST 18  ALT 19  ALKPHOS 95  BILITOT 0.2*  PROT 7.2  ALBUMIN 3.3*    CBC:  Recent Labs Lab 04/07/14 2107 04/08/14 0449  WBC 10.1 9.8  HGB 13.9 13.5  HCT 42.2 40.7  MCV 93.4 91.9  PLT 265 240    Cardiac Enzymes:  Recent Labs Lab 04/07/14 2107  TROPONINI <0.30    Radiology: Dg Chest 2 View  04/07/2014   CLINICAL DATA:  Shortness of breath.  EXAM: CHEST  2 VIEW  COMPARISON:  03/14/2014  FINDINGS: Normal heart size. Hilar structures are prominent which may suggest arterial hypertension. Prominent emphysematous changes throughout the lungs with large bullous changes in the right upper lung and left upper and lower lungs. Fibrosis in the lungs. No focal airspace disease or consolidation. No pneumothorax. No blunting of costophrenic angles. Surgical clips in the base of the neck. No change since prior study.  IMPRESSION: Emphysematous changes and fibrosis in the lungs with prominent hilar structures possibly indicating pulmonary arterial hypertension. No evidence of active pulmonary disease. No change since prior study.   Electronically Signed   By: Lucienne Capers M.D.   On: 04/07/2014 21:49   Dg Knee 1-2 Views Left  04/08/2014   CLINICAL DATA:  Left knee pain and swelling.  No injury.  EXAM: LEFT KNEE - 1-2 VIEW  COMPARISON:  None.  FINDINGS: There is no evidence of fracture, dislocation, or joint effusion. There is no evidence of arthropathy or other focal bone abnormality. Soft tissues are unremarkable.  IMPRESSION: Negative.   Electronically Signed   By: Lucienne Capers M.D.   On: 04/08/2014 01:08   US Venous Img Lower Bilateral  04/08/2014   CLINICAL DATA:  ?  DVT  EXAM: BILATERAL LOWER EXTREMITY VENOUS DOPPLER ULTRASOUND  TECHNIQUE: Gray-scale sonography with compression, as well as color and duplex ultrasound, were performed to evaluate the deep venous system from the level of the common femoral vein through the popliteal and proximal calf veins.  COMPARISON:  None  FINDINGS: Normal compressibility of the common femoral, superficial femoral, and popliteal veins, as well as the proximal calf veins. No filling defects to suggest DVT on grayscale or color Doppler imaging. Doppler waveforms show normal direction of venous flow, normal respiratory phasicity and  response to augmentation. Visualized segments of the saphenous venous system normal in caliber and compressibility.  IMPRESSION: 1.  No evidence of lower extremity deep vein thrombosis,bilateral.   Electronically Signed   By: Arne Cleveland M.D.   On: 04/08/2014 13:00   1.Echocardiogram 3.2014 Left ventricle: The cavity size was normal. Wall thickness was normal. Systolic function was normal. The estimated ejection fraction was in the range of 60% to 65%. Images were inadequate for LV wall motion assessment. Doppler parameters are consistent with abnormal left ventricular relaxation (grade 1 diastolic dysfunction). Limited images due to poor acoustic windows. No obvious change in LV systolic function compared to previous study April 2010. - Mitral valve: Mildly thickened leaflets . Trivial regurgitation. - Left atrium: The atrium was mildly dilated. - Right atrium: Central venous pressure: 101mm Hg (est). - Tricuspid valve: Mild regurgitation. - Pulmonary arteries: Systolic pressure could not be accurately estimated. - Pericardium, extracardiac: There was no pericardial effusion.    Medications:   Scheduled Medications: . antiseptic oral rinse  7 mL Mouth Rinse BID  . busPIRone  5 mg Oral TID  . chlorhexidine  15 mL Mouth Rinse BID  . cyclobenzaprine  10 mg Oral BID  . dextromethorphan-guaiFENesin  1 tablet Oral BID  . gabapentin  300 mg Oral BID  . ipratropium  0.5 mg Nebulization Q6H  . levalbuterol  0.63 mg Nebulization Q6H  . levofloxacin (LEVAQUIN) IV  500 mg Intravenous Q24H  . methylPREDNISolone (SOLU-MEDROL) injection  60 mg Intravenous Q6H  . Milnacipran  50 mg Oral BID  . mometasone-formoterol  2 puff Inhalation BID  . roflumilast  500 mcg Oral Daily  . sodium chloride  3 mL Intravenous Q12H  . sodium chloride  3 mL Intravenous Q12H       PRN Medications: sodium chloride, furosemide, hydrALAZINE, HYDROcodone-acetaminophen, sodium chloride   Assessment and  Plan:   1.CAD: Patient states he was at [facility] transplant center where he spent 3 days in June of this year with stress test, echo and cardiac cath. Records have been requested, as there are no records available in Care Everywhere. So far, no records have been received. Per Dr. 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  Patient states that Case Worker, Bartholome Bill RN at transplant center has records. Will re-request this.   He is tachycardic, but with COPD and breathing status, will not start metoprolol. Slightly hypertensive. Consider adding CCB, diltiazem for HR control and BP. Begin diltiazem 30 mg Q 6 hours and monitor HR and BP response. Likely related to steroid therapy.   2. Hypertension: Continues on hydralazine and lasix (prn). Creatinine 1.18 this am. CO2 36.  No evidence of fluid retention of diastolic CHF.   3. End-Stage COPD: Work-up per Dr. Luan Pulling and [facility].   Phill Myron. Lawrence NP  04/09/2014, 8:15 AM  Patient seen and discussed with NP Purcell Nails.  1. Reported  history of CAD - reported prior testing at [facility], seems to have been prior to consideration for lung transplant. We are still awaiting records of cardiac testing from [place] - echo 11/2012 here with normal systolic function, grade I diastolic dysfunction.  - no evidence of ACS this admission by enzymes or EKG. CXR with chronic emphysematous changes, no edema.   2. Sinus tachycardia - related to COPD exacerbation likely, potentially effect of steroids and nebs. No arrhtymia noted on tele - continue COPD management, can add TSH on to AM labs. Ok to try low dose dilt, however if physiologic repsonse bigger focus is treating COPD exacerbation.   Please notify us when records are available  Zandra Abts MD

## 2014-04-10 MED ORDER — LEVOFLOXACIN 500 MG PO TABS: 500.0000 mg | ORAL_TABLET | Freq: Every day | ORAL | Status: DC

## 2014-04-10 MED ORDER — DILTIAZEM HCL ER COATED BEADS 180 MG PO CP24: 180.0000 mg | ORAL_CAPSULE | Freq: Every day | ORAL | Status: AC

## 2014-04-10 MED ORDER — PANTOPRAZOLE SODIUM 40 MG PO TBEC: 40.0000 mg | DELAYED_RELEASE_TABLET | Freq: Every day | ORAL | Status: DC

## 2014-04-10 MED ORDER — PREDNISONE 10 MG PO TABS: 10.0000 mg | ORAL_TABLET | Freq: Every day | ORAL | Status: AC

## 2014-04-10 NOTE — Discharge Summary (Signed)
Physician Discharge Summary  Patient ID: Connor Armstrong MRN: [id number] DOB/AGE: 1951/08/08 63 y.o. Primary Care Physician:Livy Ross L, MD Admit date: 04/07/2014 Discharge date: 04/10/2014    Discharge Diagnoses:   Principal Problem:   COPD exacerbation Active Problems:   Hypertension   Fibromyalgia   Anxiety state, unspecified   Acute pain of left knee   Coronary atherosclerosis of native coronary artery   Tachycardia     Medication List         albuterol 108 (90 BASE) MCG/ACT inhaler  Commonly known as:  PROVENTIL HFA;VENTOLIN HFA  Inhale 2 puffs into the lungs every 6 (six) hours as needed. For shortness of breath     albuterol (2.5 MG/3ML) 0.083% nebulizer solution  Commonly known as:  PROVENTIL  Take 2.5 mg by nebulization 3 (three) times daily as needed for wheezing or shortness of breath.     BREO ELLIPTA 100-25 MCG/INH Aepb  Generic drug:  Fluticasone Furoate-Vilanterol  Inhale 1 puff into the lungs daily.     busPIRone 5 MG tablet  Commonly known as:  BUSPAR  Take 1 tablet (5 mg total) by mouth 3 (three) times daily.     cyclobenzaprine 10 MG tablet  Commonly known as:  FLEXERIL  Take 10 mg by mouth 2 (two) times daily.     diltiazem 180 MG 24 hr capsule  Commonly known as:  CARDIZEM CD  Take 1 capsule (180 mg total) by mouth daily.     furosemide 40 MG tablet  Commonly known as:  LASIX  Take 40 mg by mouth daily as needed for fluid.     gabapentin 300 MG capsule  Commonly known as:  NEURONTIN  Take 300 mg by mouth 2 (two) times daily.     levofloxacin 500 MG tablet  Commonly known as:  LEVAQUIN  Take 1 tablet (500 mg total) by mouth daily.     Milnacipran 50 MG Tabs tablet  Commonly known as:  SAVELLA  Take 50 mg by mouth 2 (two) times daily.     pantoprazole 40 MG tablet  Commonly known as:  PROTONIX  Take 1 tablet (40 mg total) by mouth daily.     predniSONE 10 MG tablet  Commonly known as:  DELTASONE  Take 1 tablet (10 mg  total) by mouth daily.     pseudoephedrine-guaifenesin 60-600 MG per tablet  Commonly known as:  MUCINEX D  Take 1 tablet by mouth every 12 (twelve) hours.     roflumilast 500 MCG Tabs tablet  Commonly known as:  DALIRESP  Take 1 tablet (500 mcg total) by mouth daily.     tiotropium 18 MCG inhalation capsule  Commonly known as:  SPIRIVA  Place 18 mcg into inhaler and inhale daily.        Discharged Condition: Improved    Consults: Cardiology, Dr. Domenic Polite; orthopedics, Dr. Luna Glasgow  Significant Diagnostic Studies: Dg Chest 2 View  04/07/2014   CLINICAL DATA:  Shortness of breath.  EXAM: CHEST  2 VIEW  COMPARISON:  03/14/2014  FINDINGS: Normal heart size. Hilar structures are prominent which may suggest arterial hypertension. Prominent emphysematous changes throughout the lungs with large bullous changes in the right upper lung and left upper and lower lungs. Fibrosis in the lungs. No focal airspace disease or consolidation. No pneumothorax. No blunting of costophrenic angles. Surgical clips in the base of the neck. No change since prior study.  IMPRESSION: Emphysematous changes and fibrosis in the lungs with prominent hilar structures possibly indicating  pulmonary arterial hypertension. No evidence of active pulmonary disease. No change since prior study.   Electronically Signed   By: Lucienne Capers M.D.   On: 04/07/2014 21:49   Dg Chest 2 View  03/14/2014   CLINICAL DATA:  Shortness of breath.  Cough.  Congestion.  Fever.  EXAM: CHEST  2 VIEW  COMPARISON:  06/30/2013  FINDINGS: Normal heart size and pulmonary vascularity. Fairly prominent emphysematous changes in the lungs with scattered fibrosis bilaterally. Increased density in the right mid lung remains stable. Bullous changes in the apices and left lung base. No focal airspace disease or consolidation. Mediastinal contours appear intact. No pneumothorax. Postoperative changes in the base the neck. Appearance is similar to prior  study.  IMPRESSION: Prominent emphysematous changes and fibrosis in the lungs. No evidence of active pulmonary disease appear   Electronically Signed   By: Lucienne Capers M.D.   On: 03/14/2014 21:12   Dg Knee 1-2 Views Left  04/08/2014   CLINICAL DATA:  Left knee pain and swelling.  No injury.  EXAM: LEFT KNEE - 1-2 VIEW  COMPARISON:  None.  FINDINGS: There is no evidence of fracture, dislocation, or joint effusion. There is no evidence of arthropathy or other focal bone abnormality. Soft tissues are unremarkable.  IMPRESSION: Negative.   Electronically Signed   By: Lucienne Capers M.D.   On: 04/08/2014 01:08   US Venous Img Lower Bilateral  04/08/2014   CLINICAL DATA:  ? DVT  EXAM: BILATERAL LOWER EXTREMITY VENOUS DOPPLER ULTRASOUND  TECHNIQUE: Gray-scale sonography with compression, as well as color and duplex ultrasound, were performed to evaluate the deep venous system from the level of the common femoral vein through the popliteal and proximal calf veins.  COMPARISON:  None  FINDINGS: Normal compressibility of the common femoral, superficial femoral, and popliteal veins, as well as the proximal calf veins. No filling defects to suggest DVT on grayscale or color Doppler imaging. Doppler waveforms show normal direction of venous flow, normal respiratory phasicity and response to augmentation. Visualized segments of the saphenous venous system normal in caliber and compressibility.  IMPRESSION: 1.  No evidence of lower extremity deep vein thrombosis,bilateral.   Electronically Signed   By: Arne Cleveland M.D.   On: 04/08/2014 13:00    Lab Results: Basic Metabolic Panel:  Recent Labs  04/07/14 2107 04/08/14 0449  NA 142 141  K 4.3 4.1  CL 90* 94*  CO2 44* 36*  GLUCOSE 112* 189*  BUN 19 18  CREATININE 1.40* 1.18  CALCIUM 9.4 9.3   Liver Function Tests:  Recent Labs  04/08/14 0449  AST 18  ALT 19  ALKPHOS 95  BILITOT 0.2*  PROT 7.2  ALBUMIN 3.3*     CBC:  Recent Labs   04/07/14 2107 04/08/14 0449  WBC 10.1 9.8  NEUTROABS 7.2  --   HGB 13.9 13.5  HCT 42.2 40.7  MCV 93.4 91.9  PLT 265 240    Recent Results (from the past 240 hour(s))  BODY FLUID CULTURE     Status: None   Collection Time    04/08/14 11:26 AM      Result Value Ref Range Status   Specimen Description KNEE LEFT   Final   Special Requests NONE   Final   Gram Stain     Final   Value: FEW WBC PRESENT, PREDOMINANTLY PMN     NO ORGANISMS SEEN     Performed at [facility]  Final   Value: NO GROWTH 1 DAY     Performed at [facility] Course: He came to the emergency department because he was having cough congestion and fever. He also had pain and swelling in his left leg. His left knee was swollen. He was more short of breath. He has chronic COPD and is being evaluated for potential transplant. He was admitted to the intensive care unit started on intravenous antibiotics steroids etc. Consultation was obtained with cardiology because he was turned down for pulmonary transplantation apparently because of ischemic heart disease. Although I do have a report of a stress test I do not have cardiac catheterization report but that was done. He had orthopedic consultation and had aspiration and steroid injection in his left knee. He did not show crystals so this is not felt to be related to gout or pseudogout. He improved over the next several days. He had some tachycardia and hypertension and was started on diltiazem. By the time of discharge he was back almost to baseline as far as his breathing is concerned. His heart rate was better. His knee was much improved.  Discharge Exam: Blood pressure 142/94, pulse 85, temperature 98 F (36.7 C), temperature source Oral, resp. rate 21, height 5\' 11"  (1.803 m), weight 85.7 kg (188 lb 15 oz), SpO2 98.00%. He is awake and alert. His chest is clear with diminished breath sounds.  His heart is regular. Abdomen is soft.  Disposition: Home with home health services      Discharge Instructions   Face-to-face encounter (required for Medicare/Medicaid patients)    Complete by:  As directed   I Elianis Fischbach L certify that this patient is under my care and that I, or a nurse practitioner or physician's assistant working with me, had a face-to-face encounter that meets the physician face-to-face encounter requirements with this patient on 04/10/2014. The encounter with the patient was in whole, or in part for the following medical condition(s) which is the primary reason for home health care (List medical condition): COPD  The encounter with the patient was in whole, or in part, for the following medical condition, which is the primary reason for home health care:  COPD  I certify that, based on my findings, the following services are medically necessary home health services:   Nursing Physical therapy    My clinical findings support the need for the above services:  Shortness of breath with activity  Further, I certify that my clinical findings support that this patient is homebound due to:  Shortness of Breath with activity  Reason for Medically Necessary Home Health Services:  Skilled Nursing- Change/Decline in Patient [place]    Complete by:  As directed   To provide the following care/treatments:   PT RN             Follow-up Information   Follow up with Rozann Lesches, MD On 05/04/2014. (1:40 PM)    Specialty:  Cardiology   Contact information:   [place] [id number] [phone]       Signed: Sinda Du L   04/10/2014, 10:29 AM

## 2014-04-10 NOTE — Progress Notes (Signed)
Late entry for 1432: pt d/c at this time. Pt verbalizes understanding of d/c instructions, medications, follow up appts  And when to call MD/seek medical attention. Medication changes/new meds were clearly reviewed with pt and he understands this at time of d/c. Prescriptions were called into his pharmacy by MD. IV was d/c previously without any complications. Pt encouraged to check room thoroughly for belongings, and reminded of belongings policy. Pt d/c via wheelchair accompanied by NT and his wife. Marry Guan

## 2014-04-10 NOTE — Progress Notes (Signed)
Subjective: He says he feels okay but has been having some trouble with heartburn. He feels like he has over eaten. He has no other new complaints. He had stopped his protonix that he had been taking at home. He feels like he is ready for discharge.  Objective: Vital signs in last 24 hours: Temp:  [97.7 F (36.5 C)-98.1 F (36.7 C)] 98 F (36.7 C) (08/01 0606) Pulse Rate:  [85-99] 85 (08/01 0606) Resp:  [20-21] 21 (08/01 0606) BP: (139-170)/(78-94) 142/94 mmHg (08/01 0606) SpO2:  [94 %-100 %] 98 % (08/01 0724) FiO2 (%):  [30 %] 30 % (08/01 0724) Weight change:  Last BM Date: 04/08/14  Intake/Output from previous day: 07/31 0701 - 08/01 0700 In: 1023 [P.O.:720; I.V.:3; IV Piggyback:300] Out: 300 [Urine:300]  PHYSICAL EXAM General appearance: alert, cooperative and mild distress Resp: Diminished breath sounds and prolonged expiratory phase Cardio: regular rate and rhythm, S1, S2 normal, no murmur, click, rub or gallop GI: soft, non-tender; bowel sounds normal; no masses,  no organomegaly Extremities: extremities normal, atraumatic, no cyanosis or edema  Lab Results:  Results for orders placed during the hospital encounter of 04/07/14 (from the past 48 hour(s))  BODY FLUID CELL COUNT WITH DIFFERENTIAL     Status: Abnormal   Collection Time    04/08/14 11:25 AM      Result Value Ref Range   Fluid Type-TYPFL SYNOVIAL     Fluid Type-FCT KNEE     Comment: LEFT     CORRECTED ON 07/30 AT 1141: PREVIOUSLY REPORTED AS JOINT, KNEE   Color, Fluid PINK (*) YELLOW   Appearance, Fluid HAZY (*) CLEAR   WBC, Fluid 3831 (*) 0 - 1000 cu mm   Neutrophil Count, Fluid 45 (*) 0 - 25 %   Lymphs, Fluid 2     Monocyte-Macrophage-Serous Fluid 53  50 - 90 %   Other Cells, Fluid NO CRYSTALS SEEN    BODY FLUID CULTURE     Status: None   Collection Time    04/08/14 11:26 AM      Result Value Ref Range   Specimen Description KNEE LEFT     Special Requests NONE     Gram Stain       Value: FEW WBC  PRESENT, PREDOMINANTLY PMN     NO ORGANISMS SEEN     Performed at [facility]   Culture       Value: NO GROWTH 1 DAY     Performed at [facility]   Report Status PENDING      ABGS  Recent Labs  04/07/14 2340  PHART 7.375  PO2ART 91.9  TCO2 33.2  HCO3 37.5*   CULTURES Recent Results (from the past 240 hour(s))  BODY FLUID CULTURE     Status: None   Collection Time    04/08/14 11:26 AM      Result Value Ref Range Status   Specimen Description KNEE LEFT   Final   Special Requests NONE   Final   Gram Stain     Final   Value: FEW WBC PRESENT, PREDOMINANTLY PMN     NO ORGANISMS SEEN     Performed at [facility]   Culture     Final   Value: NO GROWTH 1 DAY     Performed at [facility]   Report Status PENDING   Incomplete   Studies/Results: US Venous Img Lower Bilateral  04/08/2014   CLINICAL DATA:  ? DVT  EXAM:  BILATERAL LOWER EXTREMITY VENOUS DOPPLER ULTRASOUND  TECHNIQUE: Gray-scale sonography with compression, as well as color and duplex ultrasound, were performed to evaluate the deep venous system from the level of the common femoral vein through the popliteal and proximal calf veins.  COMPARISON:  None  FINDINGS: Normal compressibility of the common femoral, superficial femoral, and popliteal veins, as well as the proximal calf veins. No filling defects to suggest DVT on grayscale or color Doppler imaging. Doppler waveforms show normal direction of venous flow, normal respiratory phasicity and response to augmentation. Visualized segments of the saphenous venous system normal in caliber and compressibility.  IMPRESSION: 1.  No evidence of lower extremity deep vein thrombosis,bilateral.   Electronically Signed   By: Arne Cleveland M.D.   On: 04/08/2014 13:00    Medications:  Prior to Admission:  Prescriptions prior to admission  Medication Sig Dispense Refill  . albuterol (PROVENTIL HFA;VENTOLIN HFA) 108 (90 BASE) MCG/ACT inhaler Inhale 2  puffs into the lungs every 6 (six) hours as needed. For shortness of breath       . albuterol (PROVENTIL) (2.5 MG/3ML) 0.083% nebulizer solution Take 2.5 mg by nebulization 3 (three) times daily as needed for wheezing or shortness of breath.       . busPIRone (BUSPAR) 5 MG tablet Take 1 tablet (5 mg total) by mouth 3 (three) times daily.  90 tablet  5  . cyclobenzaprine (FLEXERIL) 10 MG tablet Take 10 mg by mouth 2 (two) times daily.       . Fluticasone Furoate-Vilanterol (BREO ELLIPTA) 100-25 MCG/INH AEPB Inhale 1 puff into the lungs daily.       . furosemide (LASIX) 40 MG tablet Take 40 mg by mouth daily as needed for fluid.       Marland Kitchen gabapentin (NEURONTIN) 300 MG capsule Take 300 mg by mouth 2 (two) times daily.       . Milnacipran (SAVELLA) 50 MG TABS Take 50 mg by mouth 2 (two) times daily.       . predniSONE (DELTASONE) 10 MG tablet Take 1 tablet (10 mg total) by mouth daily.  100 tablet  12  . pseudoephedrine-guaifenesin (MUCINEX D) 60-600 MG per tablet Take 1 tablet by mouth every 12 (twelve) hours.      . roflumilast (DALIRESP) 500 MCG TABS tablet Take 1 tablet (500 mcg total) by mouth daily.  30 tablet  12  . tiotropium (SPIRIVA) 18 MCG inhalation capsule Place 18 mcg into inhaler and inhale daily.         Scheduled: . antiseptic oral rinse  7 mL Mouth Rinse BID  . busPIRone  5 mg Oral TID  . chlorhexidine  15 mL Mouth Rinse BID  . cyclobenzaprine  10 mg Oral BID  . dextromethorphan-guaiFENesin  1 tablet Oral BID  . diltiazem  30 mg Oral 4 times per day  . enoxaparin (LOVENOX) injection  40 mg Subcutaneous Q24H  . gabapentin  300 mg Oral BID  . ipratropium  0.5 mg Nebulization Q6H  . levalbuterol  0.63 mg Nebulization Q6H  . levofloxacin (LEVAQUIN) IV  500 mg Intravenous Q24H  . methylPREDNISolone (SOLU-MEDROL) injection  60 mg Intravenous Q6H  . Milnacipran  50 mg Oral BID  . mometasone-formoterol  2 puff Inhalation BID  . pantoprazole  40 mg Oral Daily  . roflumilast  500 mcg  Oral Daily  . sodium chloride  3 mL Intravenous Q12H  . sodium chloride  3 mL Intravenous Q12H   Continuous:  YSA:YTKZSW chloride,  alum & mag hydroxide-simeth, furosemide, hydrALAZINE, HYDROcodone-acetaminophen, sodium chloride  Assesment: He was admitted with COPD exacerbation. He has been having trouble with tachycardia. He had pain and swelling in his left knee and had an aspiration and injection of his knee but did not show crystals so it does not appear that this is gout or pseudogout. He has hypertension he is now on diltiazem. This should help some of his chronic tachycardia Principal Problem:   COPD exacerbation Active Problems:   Hypertension   Fibromyalgia   Anxiety state, unspecified   Acute pain of left knee   Coronary atherosclerosis of native coronary artery   Tachycardia    Plan: Discharge today    LOS: 3 days   Isela Stantz L 04/10/2014, 10:23 AM

## 2014-04-11 LAB — BODY FLUID CULTURE: Culture: NO GROWTH

## 2014-04-12 ENCOUNTER — Encounter (HOSPITAL_COMMUNITY): Payer: Medicare Other

## 2014-04-14 ENCOUNTER — Encounter (HOSPITAL_COMMUNITY): Payer: Medicare Other

## 2014-04-16 ENCOUNTER — Ambulatory Visit: Payer: Self-pay | Admitting: Cardiology

## 2014-04-19 ENCOUNTER — Encounter (HOSPITAL_COMMUNITY): Payer: Medicare Other

## 2014-04-21 ENCOUNTER — Encounter (HOSPITAL_COMMUNITY): Payer: Medicare Other

## 2014-04-26 ENCOUNTER — Encounter (HOSPITAL_COMMUNITY): Payer: Medicare Other

## 2014-04-28 ENCOUNTER — Encounter (HOSPITAL_COMMUNITY): Payer: Medicare Other

## 2014-05-03 ENCOUNTER — Encounter (HOSPITAL_COMMUNITY): Payer: Medicare Other

## 2014-05-03 ENCOUNTER — Ambulatory Visit: Payer: Self-pay | Admitting: Cardiology

## 2014-05-04 ENCOUNTER — Ambulatory Visit (INDEPENDENT_AMBULATORY_CARE_PROVIDER_SITE_OTHER): Payer: Medicare Other | Admitting: Cardiology

## 2014-05-04 ENCOUNTER — Encounter: Payer: Self-pay | Admitting: Cardiology

## 2014-05-04 VITALS — BP 126/82 | HR 111 | Ht 71.0 in | Wt 189.0 lb

## 2014-05-04 DIAGNOSIS — J4489 Other specified chronic obstructive pulmonary disease: Secondary | ICD-10-CM

## 2014-05-04 DIAGNOSIS — J449 Chronic obstructive pulmonary disease, unspecified: Secondary | ICD-10-CM

## 2014-05-04 DIAGNOSIS — I2510 Atherosclerotic heart disease of native coronary artery without angina pectoris: Secondary | ICD-10-CM

## 2014-05-04 NOTE — Assessment & Plan Note (Signed)
Requesting heart catheterization and stress test reports from [facility], recent evaluation as noted above. It sounds like he most likely has an occluded RCA with collaterals, and medical therapy would be most appropriate. Aspirin 81 mg daily would be reasonable, continue Cardizem CD, consider low-dose statin with LDL 101. He will need to establish with a physician in the [facility] area once he moves.

## 2014-05-04 NOTE — Patient Instructions (Signed)
Enjoy your new home in [place] !   We will obtain your records from [facility]    Thank you for choosing [facility] !

## 2014-05-04 NOTE — Assessment & Plan Note (Signed)
End-stage, on continuous oxygen, nebulizer/MDI treatments, and steroids. He has been turned down for transplant evaluations at [facility] and also the [facility] in [place].

## 2014-05-04 NOTE — Progress Notes (Signed)
Clinical Summary [name] is a 63 y.o.male seen recently as an inpatient consult at [facility] in July. He has end-stage COPD and underwent a lung transplant evaluation at [facility], was turned down, one issue being documentation of ischemic heart disease. I did not have any details regarding this at the time of consultation, no information in Care Everywhere. We did request records. He reportedly had undergone an echocardiogram, stress test, and ultimately a heart catheterization. Sounds like he most likely had documentation of an occluded RCA with collaterals following abnormal stress test, although no intervention was required.  He is here with his wife today. He tells me that he sent his information to the [facility] in [facility], and was again turned down for lung transplant, additional issues being esophageal dysmotility, chronic pain with fibromyalgia, history of renal cancer with mild renal insufficiency.  He does not report any angina symptoms, is chronically short of breath and wears oxygen continuously with BiPAP at nighttime. He is actually planning to move to [facility] to be close to his son, to leave next week. He will be establishing with physicians in that area.  Today I reviewed the information I have which is still fairly limited. I told Mr. Odowd that we would request the actual heart catheterization report and stress test report from [place] to complete the information (what we received was a summary of results from the transplant team), although at this point it seems that medical therapy would be the most appropriate, particularly if he has a chronically occluded RCA with reasonable collateral flow. We did discuss taking a baby aspirin daily, continuing on Cardizem CD for now.   Allergies  Allergen Reactions  . Morphine And Related Other (See Comments)    Paralysis, "shuts down kidney and bowel tract"   . Other     All narcotics cause paralysis, and  shuts down kidney and bowel tract     Current Outpatient Prescriptions  Medication Sig Dispense Refill  . albuterol (PROVENTIL HFA;VENTOLIN HFA) 108 (90 BASE) MCG/ACT inhaler Inhale 2 puffs into the lungs every 6 (six) hours as needed. For shortness of breath       . albuterol (PROVENTIL) (2.5 MG/3ML) 0.083% nebulizer solution Take 2.5 mg by nebulization 3 (three) times daily as needed for wheezing or shortness of breath.       . busPIRone (BUSPAR) 5 MG tablet Take 1 tablet (5 mg total) by mouth 3 (three) times daily.  90 tablet  5  . cyclobenzaprine (FLEXERIL) 10 MG tablet Take 10 mg by mouth 2 (two) times daily.       Marland Kitchen diltiazem (CARDIZEM CD) 180 MG 24 hr capsule Take 1 capsule (180 mg total) by mouth daily.  30 capsule  12  . Fluticasone Furoate-Vilanterol (BREO ELLIPTA) 100-25 MCG/INH AEPB Inhale 1 puff into the lungs daily.       . furosemide (LASIX) 40 MG tablet Take 40 mg by mouth daily as needed for fluid.       Marland Kitchen gabapentin (NEURONTIN) 300 MG capsule Take 300 mg by mouth 2 (two) times daily.       . Milnacipran (SAVELLA) 50 MG TABS Take 50 mg by mouth 2 (two) times daily.       . pantoprazole (PROTONIX) 40 MG tablet Take 10 mg by mouth daily.      . predniSONE (DELTASONE) 10 MG tablet Take 1 tablet (10 mg total) by mouth daily.  100 tablet  12  . pseudoephedrine-guaifenesin (  MUCINEX D) 60-600 MG per tablet Take 1 tablet by mouth every 12 (twelve) hours.      . roflumilast (DALIRESP) 500 MCG TABS tablet Take 1 tablet (500 mcg total) by mouth daily.  30 tablet  12  . tiotropium (SPIRIVA) 18 MCG inhalation capsule Place 18 mcg into inhaler and inhale daily.         No current facility-administered medications for this visit.    Past Medical History  Diagnosis Date  . End stage COPD     3L/min and on BPAP @ [facility]  . Fibromyalgia   . Shingles   . Renal cancer   . Helicobacter pylori gastritis March 2014  . Candida esophagitis March 2014  . Essential hypertension, benign   .  Coronary atherosclerosis of native coronary artery     Details not clear - evaluated at [facility] (possible occluded RCA with collaterals)    Past Surgical History  Procedure Laterality Date  . Hernia repair    . Thyroid surgery    . Parathyroid surgery      Benign tumor  . Partial nephrectomy  2005    Right  . Flexible sigmoidoscopy  09/27/2012    EHM:[id number]  . Esophagogastroduodenoscopy (egd) with esophageal dilation N/A 11/21/2012    SLF: DYSPHAGIA MOST LIKELY DUE TO CANDIDA ESOPHAGITIS/Small hiatal hernia/ MODERATE Non-erosive gastritis with +H.PYLORI ON PATH  . Colonoscopy N/A 01/20/2013    SLF:9 COLON POLYPS REMOVED/MILD diverticulosis in the descending colon and sigmoid colon/MODERATE INTERNAL HEMORRHOIDS  . Esophagogastroduodenoscopy (egd) with propofol N/A 01/27/2013    SFK:CLEX WHITE PLAQUES IN THE ESOPHAGUS.  BRUSH BIOPSIES OBTAINED/Non-erosive gastritis (inflammation) was found in the gastric antrum; multiple bx/  . Savory dilation N/A 01/27/2013    Procedure: SAVORY DILATION;  Surgeon: Danie Binder, MD;  Location: [facility];  Service: Endoscopy;  Laterality: N/A;  . Esophageal biopsy N/A 01/27/2013    Procedure: BIOPSY;  Surgeon: Danie Binder, MD;  Location: [facility];  Service: Endoscopy;  Laterality: N/A;  Gastric Biopsies    Family History  Problem Relation Age of Onset  . Hypertension Mother   . Colon cancer Paternal Grandmother     Social History Mr. Reitz reports that he quit smoking about 19 months ago. His smoking use included Cigarettes. He has a 63 pack-year smoking history. He has quit using smokeless tobacco. Mr. Seufert reports that he does not drink alcohol.  Review of Systems Chronic pain with "15 triggerpoints," chronic shortness of breath, anxiety. Appetite is stable. Bruises easily on prednisone. No major bleeding episodes. Other systems reviewed and negative except as outlined.  Physical Examination Filed Vitals:   05/04/14 1343    BP: 126/82  Pulse: 111   Filed Weights   05/04/14 1343  Weight: 189 lb (85.73 kg)    No distress, wearing oxygen via nasal cannula. HEENT: Conjunctiva and lids normal, oropharynx clear.  Neck: Supple, no elevated JVP or carotid bruits, no thyromegaly.  Lungs: Decreased breath sounds throughout without active wheezing, nonlabored breathing at rest.  Cardiac: Regular rate and rhythm, no S3, soft systolic murmur, no pericardial rub.  Abdomen: Soft, nontender, bowel sounds present, no guarding or rebound.  Extremities: Trace ankle edema, distal pulses 2+.  Skin: Warm and dry. Scattered ecchymoses forearms. Musculoskeletal: No kyphosis.  Neuropsychiatric: Alert and oriented x3, affect grossly appropriate.   Problem List and Plan   Coronary atherosclerosis of native coronary artery Requesting heart catheterization and stress test reports from [facility], recent evaluation as noted above. It sounds  like he most likely has an occluded RCA with collaterals, and medical therapy would be most appropriate. Aspirin 81 mg daily would be reasonable, continue Cardizem CD, consider low-dose statin with LDL 101. He will need to establish with a physician in the [facility] area once he moves.  COPD (chronic obstructive pulmonary disease) End-stage, on continuous oxygen, nebulizer/MDI treatments, and steroids. He has been turned down for transplant evaluations at [facility] and also the [facility] in [place].    Satira Sark, M.D., F.A.C.C.

## 2014-05-05 ENCOUNTER — Ambulatory Visit: Payer: Self-pay | Admitting: Gastroenterology

## 2014-05-05 ENCOUNTER — Encounter (HOSPITAL_COMMUNITY): Payer: Medicare Other

## 2014-05-10 ENCOUNTER — Encounter (HOSPITAL_COMMUNITY): Payer: Medicare Other

## 2014-05-12 ENCOUNTER — Encounter (HOSPITAL_COMMUNITY): Payer: Medicare Other

## 2014-05-13 ENCOUNTER — Encounter: Payer: Self-pay | Admitting: Cardiology

## 2014-05-17 ENCOUNTER — Encounter (HOSPITAL_COMMUNITY): Payer: Medicare Other

## 2014-09-14 IMAGING — US US EXTREM LOW VENOUS BILAT
1 series · 14 of 24 positions shown · non-contrast
Comparison: None

CLINICAL DATA: ? DVT

EXAM:
BILATERAL LOWER EXTREMITY VENOUS DOPPLER ULTRASOUND
TECHNIQUE: Gray-scale sonography with compression, as well as color and duplex
ultrasound, were performed to evaluate the deep venous system from
the level of the common femoral vein through the popliteal and
proximal calf veins.

[Series 1: us extrem low venous bilat · 0.08mm/px · 14 of 69 slices shown]
[im 1/69]
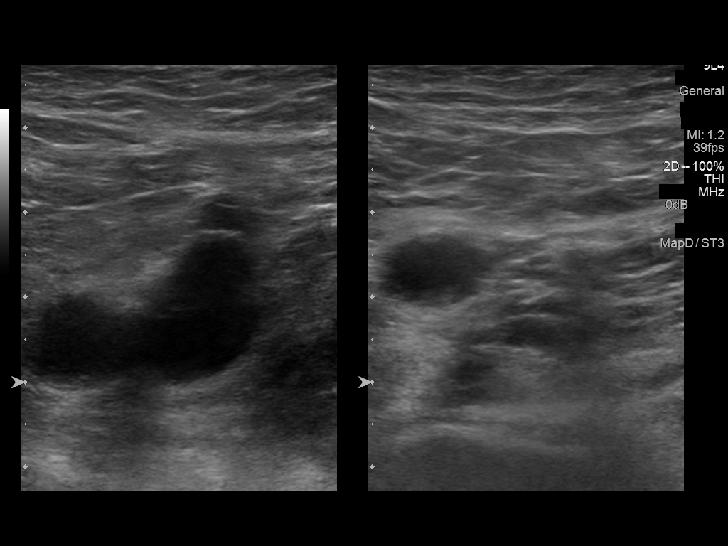
[im 6/69]
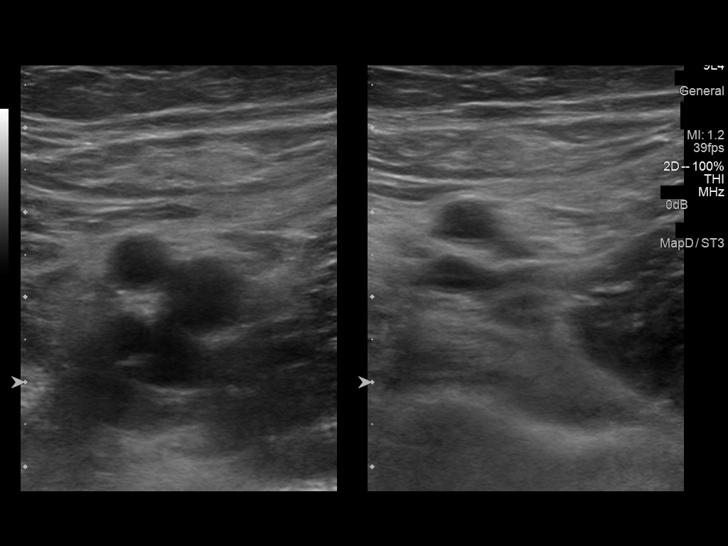
[im 12/69]
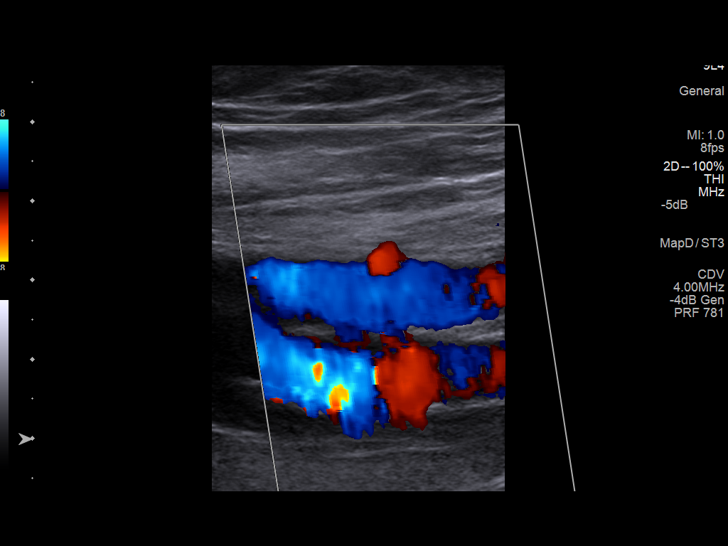
[im 18/69]
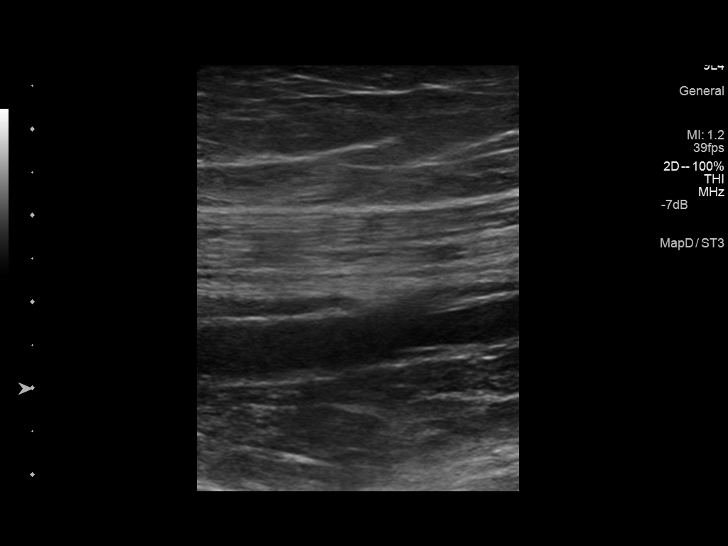
[im 21/69]
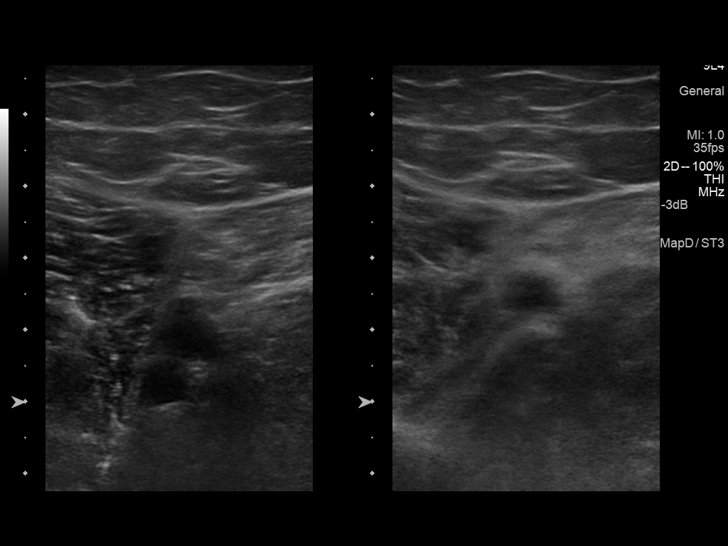
[im 27/69]
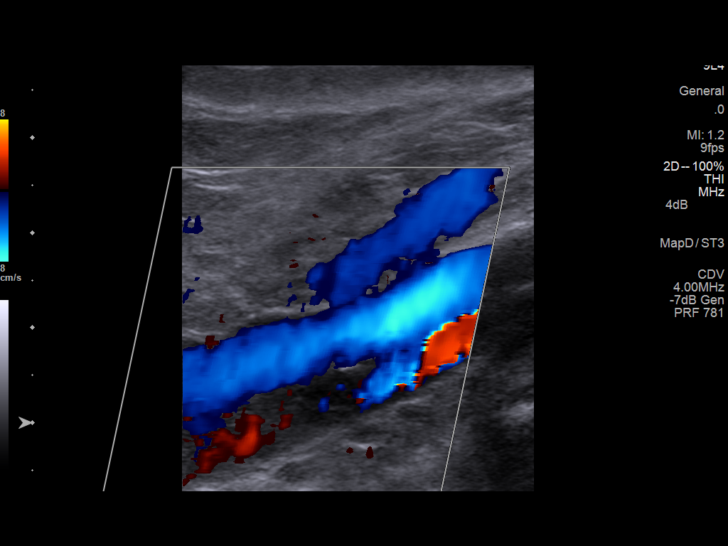
[im 33/69]
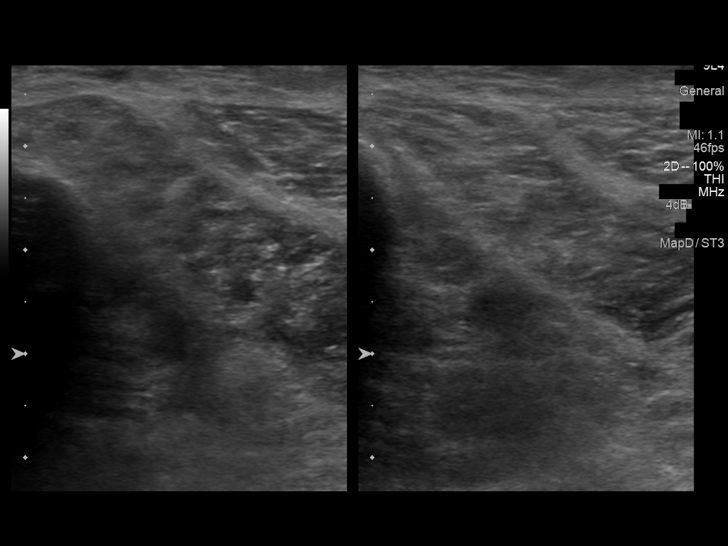
[im 36/69]
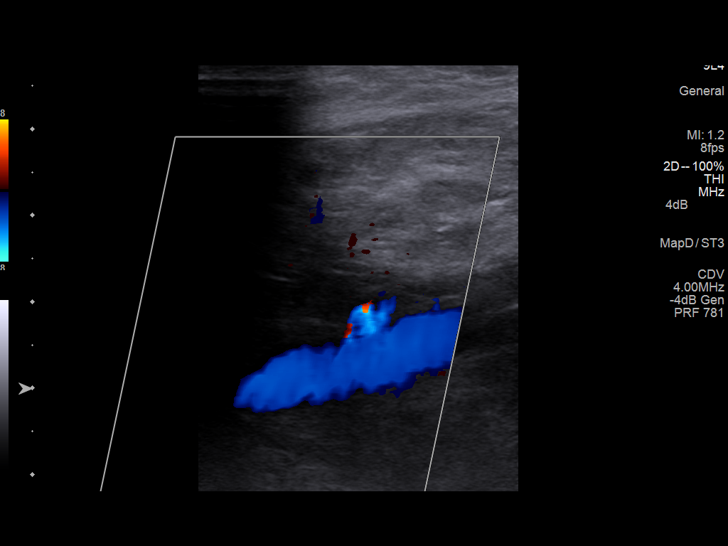
[im 42/69]
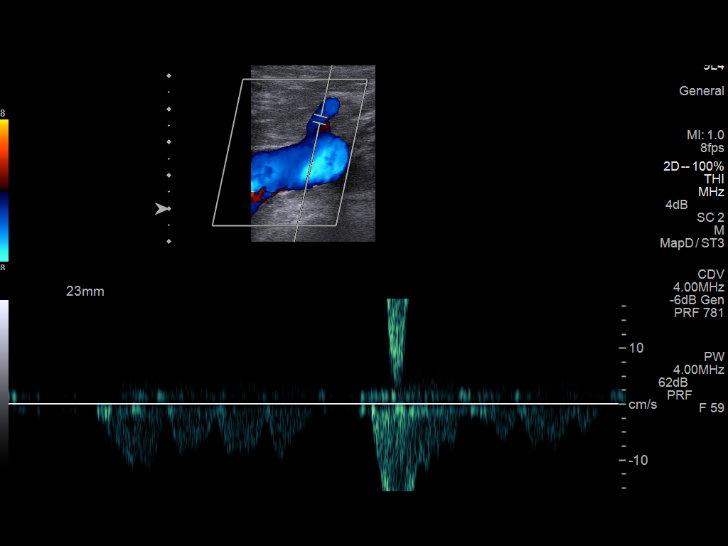
[im 48/69]
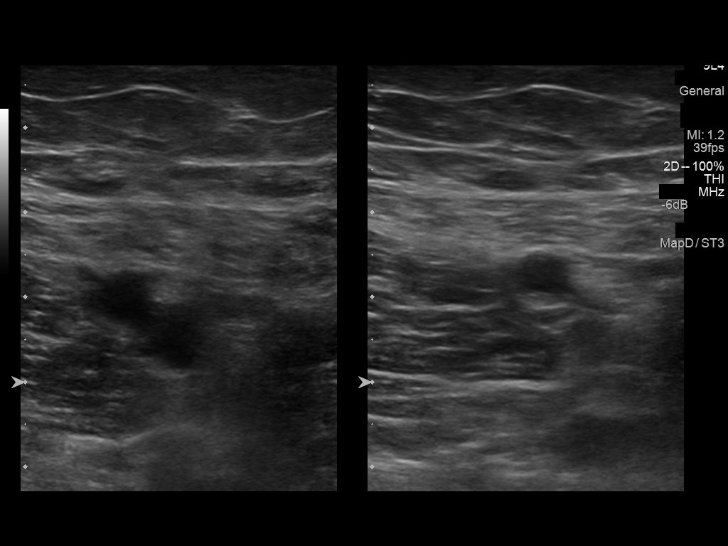
[im 54/69]
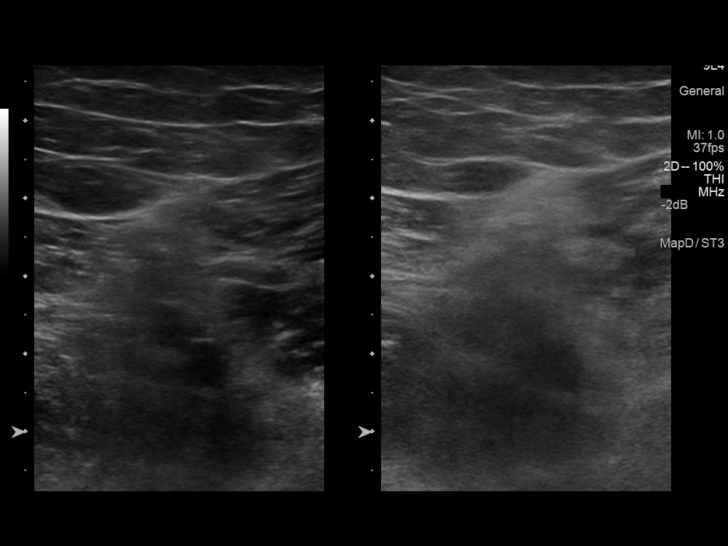
[im 57/69]
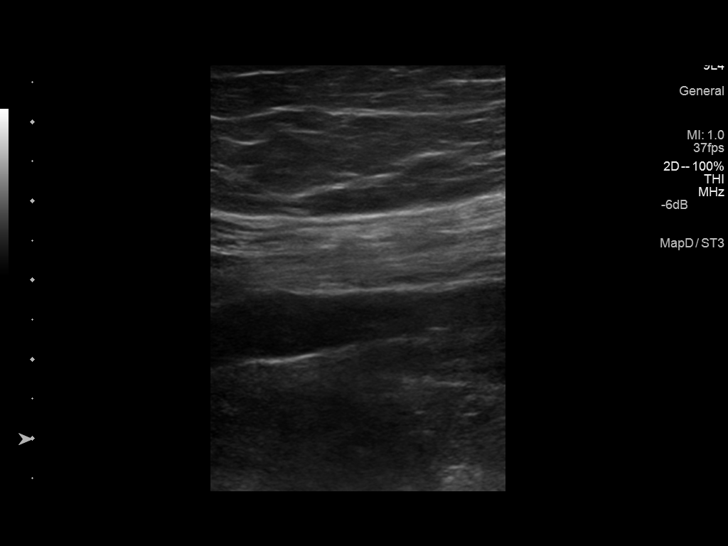
[im 63/69]
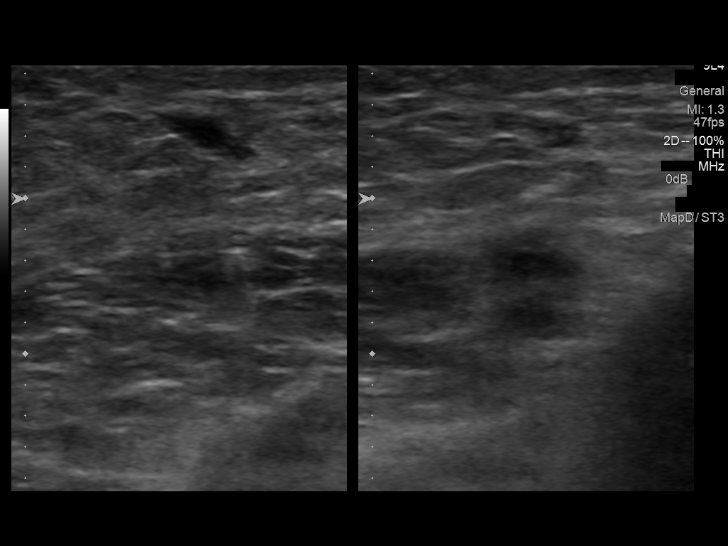
[im 69/69]
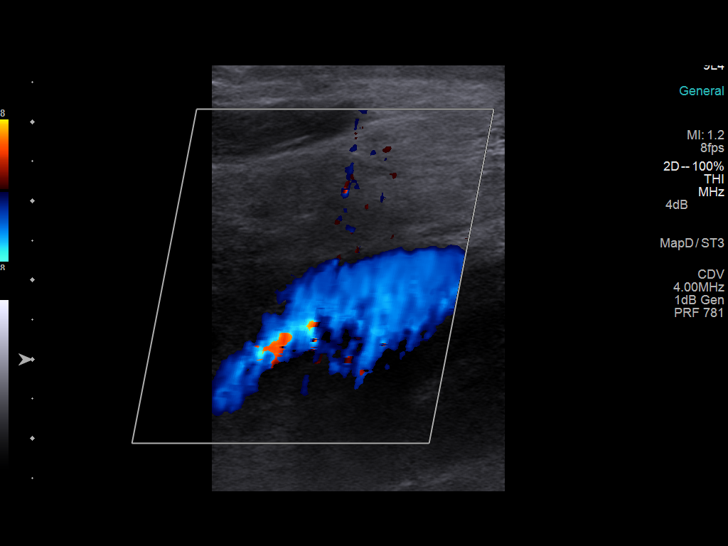

[14 of 24 positions shown; findings below may reference images not displayed]

FINDINGS: Normal compressibility of the common femoral, superficial femoral,
and popliteal veins, as well as the proximal calf veins. No filling
defects to suggest DVT on grayscale or color Doppler imaging.
Doppler waveforms show normal direction of venous flow, normal
respiratory phasicity and response to augmentation. Visualized
segments of the saphenous venous system normal in caliber and
compressibility.
IMPRESSION: 1.  No evidence of lower extremity deep vein thrombosis,bilateral.

## 2015-05-04 LAB — BLOOD GAS, ARTERIAL
Acid-Base Excess: 18.1 mmol/L — ABNORMAL HIGH (ref 0.0–2.0)
Bicarbonate: 44.5 mEq/L — ABNORMAL HIGH (ref 20.0–24.0)
Delivery systems: POSITIVE
Drawn by: 28340
Expiratory PAP: 6
Inspiratory PAP: 18
O2 Content: 3 L/min
O2 Saturation: 97.6 %
Patient temperature: 37
pCO2 arterial: 80.9 mmHg (ref 35.0–45.0)
pH, Arterial: 7.36 (ref 7.350–7.450)
pO2, Arterial: 106 mmHg — ABNORMAL HIGH (ref 80.0–100.0)

## 2016-02-09 DEATH — deceased
